# Patient Record
Sex: Female | Born: 1960 | Race: White | Hispanic: No | Smoking: Never smoker
Health system: Southern US, Community
[De-identification: ages and names within clinical notes are randomized; demographics above are authoritative.]

## PROBLEM LIST (undated history)

## (undated) DIAGNOSIS — E785 Hyperlipidemia, unspecified: Secondary | ICD-10-CM

## (undated) DIAGNOSIS — M1990 Unspecified osteoarthritis, unspecified site: Secondary | ICD-10-CM

## (undated) DIAGNOSIS — R519 Headache, unspecified: Secondary | ICD-10-CM

## (undated) DIAGNOSIS — Z860100 Personal history of colon polyps, unspecified: Secondary | ICD-10-CM

## (undated) DIAGNOSIS — G40409 Other generalized epilepsy and epileptic syndromes, not intractable, without status epilepticus: Secondary | ICD-10-CM

## (undated) DIAGNOSIS — I809 Phlebitis and thrombophlebitis of unspecified site: Secondary | ICD-10-CM

## (undated) DIAGNOSIS — M352 Behcet's disease: Secondary | ICD-10-CM

## (undated) DIAGNOSIS — N200 Calculus of kidney: Secondary | ICD-10-CM

## (undated) DIAGNOSIS — F419 Anxiety disorder, unspecified: Secondary | ICD-10-CM

## (undated) DIAGNOSIS — I219 Acute myocardial infarction, unspecified: Secondary | ICD-10-CM

## (undated) DIAGNOSIS — Z9289 Personal history of other medical treatment: Secondary | ICD-10-CM

## (undated) DIAGNOSIS — K219 Gastro-esophageal reflux disease without esophagitis: Secondary | ICD-10-CM

## (undated) DIAGNOSIS — R51 Headache: Secondary | ICD-10-CM

## (undated) DIAGNOSIS — G8929 Other chronic pain: Secondary | ICD-10-CM

## (undated) HISTORY — PX: CHOLECYSTECTOMY: SHX55

## (undated) HISTORY — DX: Phlebitis and thrombophlebitis of unspecified site: I80.9

## (undated) HISTORY — DX: Headache: R51

## (undated) HISTORY — DX: Personal history of colonic polyps: Z86.010

## (undated) HISTORY — DX: Gastro-esophageal reflux disease without esophagitis: K21.9

## (undated) HISTORY — DX: Personal history of colon polyps, unspecified: Z86.0100

## (undated) HISTORY — DX: Hyperlipidemia, unspecified: E78.5

## (undated) HISTORY — DX: Unspecified osteoarthritis, unspecified site: M19.90

## (undated) HISTORY — PX: STONE EXTRACTION WITH BASKET: SHX5318

## (undated) HISTORY — DX: Behcet's disease: M35.2

## (undated) HISTORY — DX: Personal history of other medical treatment: Z92.89

## (undated) HISTORY — PX: ADENOIDECTOMY: SUR15

## (undated) HISTORY — DX: Other chronic pain: G89.29

## (undated) HISTORY — DX: Anxiety disorder, unspecified: F41.9

## (undated) HISTORY — DX: Headache, unspecified: R51.9

## (undated) HISTORY — PX: BACK SURGERY: SHX140

## (undated) HISTORY — DX: Acute myocardial infarction, unspecified: I21.9

---
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
## 1985-12-14 HISTORY — PX: OOPHORECTOMY: SHX86

## 1986-12-14 HISTORY — PX: ABDOMINAL HYSTERECTOMY: SHX81

## 1997-12-14 HISTORY — PX: LUMBAR FUSION: SHX111

## 2004-12-14 LAB — HM COLONOSCOPY

## 2005-10-12 ENCOUNTER — Emergency Department: Payer: Self-pay | Admitting: Emergency Medicine

## 2006-12-14 HISTORY — PX: PORTACATH PLACEMENT: SHX2246

## 2007-07-22 ENCOUNTER — Emergency Department: Payer: Self-pay

## 2008-11-20 ENCOUNTER — Emergency Department: Payer: Self-pay | Admitting: Unknown Physician Specialty

## 2009-04-28 ENCOUNTER — Emergency Department: Payer: Self-pay | Admitting: Emergency Medicine

## 2009-07-12 ENCOUNTER — Inpatient Hospital Stay: Payer: Self-pay | Admitting: Internal Medicine

## 2010-03-31 ENCOUNTER — Inpatient Hospital Stay: Payer: Self-pay | Admitting: Internal Medicine

## 2010-07-13 ENCOUNTER — Emergency Department: Payer: Self-pay | Admitting: Unknown Physician Specialty

## 2010-11-18 ENCOUNTER — Inpatient Hospital Stay: Payer: Self-pay | Admitting: Internal Medicine

## 2011-03-23 ENCOUNTER — Inpatient Hospital Stay: Payer: Self-pay | Admitting: Specialist

## 2011-03-27 ENCOUNTER — Ambulatory Visit (HOSPITAL_COMMUNITY)
Admission: EM | Admit: 2011-03-27 | Discharge: 2011-03-27 | Disposition: A | Discharge status: Home / Self Care | Payer: Self-pay | Source: Other Acute Inpatient Hospital | Attending: Psychiatry | Admitting: Psychiatry

## 2011-03-27 DIAGNOSIS — R569 Unspecified convulsions: Secondary | ICD-10-CM | POA: Insufficient documentation

## 2011-03-27 DIAGNOSIS — J9600 Acute respiratory failure, unspecified whether with hypoxia or hypercapnia: Secondary | ICD-10-CM | POA: Insufficient documentation

## 2011-11-06 ENCOUNTER — Emergency Department: Payer: Self-pay | Admitting: Internal Medicine

## 2012-07-28 ENCOUNTER — Emergency Department: Payer: Self-pay | Admitting: Emergency Medicine

## 2012-07-28 LAB — CK TOTAL AND CKMB (NOT AT ARMC): CK, Total: 148 U/L (ref 21–215)

## 2012-07-28 LAB — DRUG SCREEN, URINE
Amphetamines, Ur Screen: NEGATIVE (ref ?–1000)
Benzodiazepine, Ur Scrn: POSITIVE (ref ?–200)
Cocaine Metabolite,Ur ~~LOC~~: NEGATIVE (ref ?–300)
MDMA (Ecstasy)Ur Screen: NEGATIVE (ref ?–500)
Methadone, Ur Screen: NEGATIVE (ref ?–300)
Opiate, Ur Screen: NEGATIVE (ref ?–300)
Phencyclidine (PCP) Ur S: NEGATIVE (ref ?–25)
Tricyclic, Ur Screen: NEGATIVE (ref ?–1000)

## 2012-07-28 LAB — URINALYSIS, COMPLETE
Bacteria: NONE SEEN
Bilirubin,UR: NEGATIVE
Blood: NEGATIVE
Glucose,UR: NEGATIVE mg/dL (ref 0–75)
Ketone: NEGATIVE
Leukocyte Esterase: NEGATIVE
Nitrite: NEGATIVE
Ph: 8 (ref 4.5–8.0)
RBC,UR: 1 /HPF (ref 0–5)
Specific Gravity: 1.011 (ref 1.003–1.030)
Squamous Epithelial: <1
WBC UR: 1 /HPF (ref 0–5)

## 2012-07-28 LAB — COMPREHENSIVE METABOLIC PANEL
Albumin: 3.5 g/dL (ref 3.4–5.0)
Alkaline Phosphatase: 102 U/L (ref 50–136)
BUN: 15 mg/dL (ref 7–18)
Calcium, Total: 7.7 mg/dL — ABNORMAL LOW (ref 8.5–10.1)
Chloride: 112 mmol/L — ABNORMAL HIGH (ref 98–107)
Co2: 22 mmol/L (ref 21–32)
Glucose: 104 mg/dL — ABNORMAL HIGH (ref 65–99)
Osmolality: 284 (ref 275–301)
Potassium: 3.6 mmol/L (ref 3.5–5.1)
SGOT(AST): 22 U/L (ref 15–37)
Sodium: 142 mmol/L (ref 136–145)

## 2012-07-28 LAB — CBC
HCT: 32.6 % — ABNORMAL LOW (ref 35.0–47.0)
MCH: 32.4 pg (ref 26.0–34.0)
MCHC: 35.3 g/dL (ref 32.0–36.0)
MCV: 92 fL (ref 80–100)
Platelet: 232 10*3/uL (ref 150–440)

## 2012-07-28 LAB — TROPONIN I: Troponin-I: <0.02 ng/mL

## 2012-07-28 LAB — VALPROIC ACID LEVEL: Valproic Acid: <3 ug/mL — ABNORMAL LOW

## 2013-03-02 ENCOUNTER — Ambulatory Visit (INDEPENDENT_AMBULATORY_CARE_PROVIDER_SITE_OTHER): Payer: Federal, State, Local not specified - PPO | Admitting: Internal Medicine

## 2013-03-02 ENCOUNTER — Telehealth: Payer: Self-pay | Admitting: Internal Medicine

## 2013-03-02 ENCOUNTER — Encounter: Payer: Self-pay | Admitting: Internal Medicine

## 2013-03-02 ENCOUNTER — Ambulatory Visit (INDEPENDENT_AMBULATORY_CARE_PROVIDER_SITE_OTHER): Payer: Federal, State, Local not specified - PPO

## 2013-03-02 VITALS — BP 122/82 | HR 85 | Temp 97.6°F | Ht 70.5 in | Wt 165.2 lb

## 2013-03-02 DIAGNOSIS — Z13220 Encounter for screening for lipoid disorders: Secondary | ICD-10-CM

## 2013-03-02 DIAGNOSIS — Z130 Encounter for screening for diseases of the blood and blood-forming organs and certain disorders involving the immune mechanism: Secondary | ICD-10-CM

## 2013-03-02 DIAGNOSIS — Z1239 Encounter for other screening for malignant neoplasm of breast: Secondary | ICD-10-CM

## 2013-03-02 DIAGNOSIS — R519 Headache, unspecified: Secondary | ICD-10-CM | POA: Insufficient documentation

## 2013-03-02 DIAGNOSIS — R569 Unspecified convulsions: Secondary | ICD-10-CM

## 2013-03-02 DIAGNOSIS — Z0000 Encounter for general adult medical examination without abnormal findings: Secondary | ICD-10-CM

## 2013-03-02 DIAGNOSIS — R51 Headache: Secondary | ICD-10-CM | POA: Insufficient documentation

## 2013-03-02 DIAGNOSIS — M352 Behcet's disease: Secondary | ICD-10-CM

## 2013-03-02 DIAGNOSIS — G4700 Insomnia, unspecified: Secondary | ICD-10-CM | POA: Insufficient documentation

## 2013-03-02 DIAGNOSIS — Z1231 Encounter for screening mammogram for malignant neoplasm of breast: Secondary | ICD-10-CM

## 2013-03-02 DIAGNOSIS — Z1329 Encounter for screening for other suspected endocrine disorder: Secondary | ICD-10-CM

## 2013-03-02 DIAGNOSIS — Z131 Encounter for screening for diabetes mellitus: Secondary | ICD-10-CM

## 2013-03-02 DIAGNOSIS — Z1211 Encounter for screening for malignant neoplasm of colon: Secondary | ICD-10-CM

## 2013-03-02 LAB — LIPID PANEL
Cholesterol: 234 mg/dL — ABNORMAL HIGH (ref 0–200)
HDL: 45.9 mg/dL (ref >39.00)
VLDL: 16.6 mg/dL (ref 0.0–40.0)

## 2013-03-02 LAB — BASIC METABOLIC PANEL
GFR: 78.56 mL/min (ref >60.00)
Potassium: 4.4 mEq/L (ref 3.5–5.1)
Sodium: 140 mEq/L (ref 135–145)

## 2013-03-02 LAB — LDL CHOLESTEROL, DIRECT: Direct LDL: 186.4 mg/dL

## 2013-03-02 MED ORDER — LEVETIRACETAM 500 MG PO TABS: 500.0000 mg | ORAL_TABLET | Freq: Two times a day (BID) | ORAL | Status: DC

## 2013-03-02 MED ORDER — HYDROXYCHLOROQUINE SULFATE 200 MG PO TABS: 200.0000 mg | ORAL_TABLET | Freq: Two times a day (BID) | ORAL | Status: DC

## 2013-03-02 MED ORDER — ZOLPIDEM TARTRATE 10 MG PO TABS: 10.0000 mg | ORAL_TABLET | Freq: Every evening | ORAL | Status: DC | PRN

## 2013-03-02 MED ORDER — TRAMADOL HCL 50 MG PO TABS: 50.0000 mg | ORAL_TABLET | Freq: Four times a day (QID) | ORAL | Status: DC | PRN

## 2013-03-02 NOTE — Addendum Note (Signed)
Addended by: Lorre Munroe on: 03/02/2013 01:04 PM   Modules accepted: Orders

## 2013-03-02 NOTE — Assessment & Plan Note (Signed)
Well controlled on current therapy Refilled Keppra today Pt has not had seizure in 2 years Pt would prefer not to go to a neurologist at this time

## 2013-03-02 NOTE — Progress Notes (Signed)
HPI  Pt presents to the clinic today to establish care. She recently moved from [facility] but has not seen a PCP in a number of years. She does need refills of her medications but other than that, she has no concerns today.  Flu: never Tetanus: more than 10 years Mammogram: 2004 Colonoscopy: 2006 (adenoma in cecum) Eye doctor: not in a while Dentist: biannually LMP: hysterectomy  Past Medical History  Diagnosis Date  . GERD (gastroesophageal reflux disease)   . Hyperlipidemia   . History of blood transfusion   . Seizures   . Chronic headaches   . Phlebitis   . History of colonic polyps     Current Outpatient Prescriptions  Medication Sig Dispense Refill  . hydroxychloroquine (PLAQUENIL) 200 MG tablet Take by mouth 2 (two) times daily.      Marland Kitchen levETIRAcetam (KEPPRA) 500 MG tablet Take 500 mg by mouth 2 (two) times daily.      . traMADol (ULTRAM) 50 MG tablet Take 50 mg by mouth every 6 (six) hours as needed for pain.      Marland Kitchen zolpidem (AMBIEN) 10 MG tablet Take 10 mg by mouth at bedtime as needed for sleep.       No current facility-administered medications for this visit.    Allergies  Allergen Reactions  . Codeine   . Hepatitis B Vaccine   . Imitrex (Sumatriptan)   . Other     CT Contrast    Family History  Problem Relation Age of Onset  . Diabetes Mother   . Hyperlipidemia Mother   . Stroke Mother   . Hypertension Mother   . Colon cancer Mother   . Breast cancer Mother   . Early death Father   . Heart attack Father   . Heart disease Father   . Hyperlipidemia Father     History   Social History  . Marital Status: Married    Spouse Name: N/A    Number of Children: 2  . Years of Education: 14   Occupational History  . Nurse    Social History Main Topics  . Smoking status: Never Smoker   . Smokeless tobacco: Never Used  . Alcohol Use: No  . Drug Use: No  . Sexually Active: Yes    Birth Control/ Protection: Surgical   Other Topics Concern  . Not  on file   Social History Narrative   Regular exercise-no   Caffeine Use-yes    ROS:  Constitutional: Denies fever, malaise, fatigue, headache or abrupt weight changes.  HEENT: Denies eye pain, eye redness, ear pain, ringing in the ears, wax buildup, runny nose, nasal congestion, bloody nose, or sore throat. Respiratory: Denies difficulty breathing, shortness of breath, cough or sputum production.   Cardiovascular: Denies chest pain, chest tightness, palpitations or swelling in the hands or feet.  Gastrointestinal: Denies abdominal pain, bloating, constipation, diarrhea or blood in the stool.  GU: Denies frequency, urgency, pain with urination, blood in urine, odor or discharge. Musculoskeletal: Denies decrease in range of motion, difficulty with gait, muscle pain or joint pain and swelling.  Skin: Denies redness, rashes, lesions or ulcercations.  Neurological: Denies dizziness, difficulty with memory, difficulty with speech or problems with balance and coordination.   No other specific complaints in a complete review of systems (except as listed in HPI above).  PE:  BP 122/82  Pulse 85  Temp(Src) 97.6 F (36.4 C) (Oral)  Ht 5' 10.5" (1.791 m)  Wt 165 lb 3.2 oz (74.934  kg)  BMI 23.36 kg/m2  SpO2 97% Wt Readings from Last 3 Encounters:  03/02/13 165 lb 3.2 oz (74.934 kg)    General: Appears her stated age, well developed, well nourished in NAD. HEENT: Head: normal shape and size; Eyes: sclera white, no icterus, conjunctiva pink, PERRLA and EOMs intact; Ears: Tm's gray and intact, normal light reflex; Nose: mucosa pink and moist, septum midline; Throat/Mouth: Teeth present, mucosa pink and moist, no lesions or ulcerations noted.  Neck: Normal range of motion. Neck supple, trachea midline. No massses, lumps or thyromegaly present.  Cardiovascular: Normal rate and rhythm. S1,S2 noted.  No murmur, rubs or gallops noted. No JVD or BLE edema. No carotid bruits noted. Pulmonary/Chest:  Normal effort and positive vesicular breath sounds. No respiratory distress. No wheezes, rales or ronchi noted.  Abdomen: Soft and nontender. Normal bowel sounds, no bruits noted. No distention or masses noted. Liver, spleen and kidneys non palpable. Musculoskeletal: Normal range of motion. No signs of joint swelling. No difficulty with gait.  Neurological: Alert and oriented. Cranial nerves II-XII intact. Coordination normal. +DTRs bilaterally. Psychiatric: Mood and affect normal. Behavior is normal. Judgment and thought content normal.     Assessment and Plan:  Preventative Health Maintenance:  Will set up mammogram Will refer to eye doctor Will set up colonoscopy- pt prefers Dr. Marva Panda  Will obtain basic screening labs today.

## 2013-03-02 NOTE — Assessment & Plan Note (Signed)
Well controlled Refilled plaquenil today

## 2013-03-02 NOTE — Telephone Encounter (Signed)
Ash, Let me find out if we have the stuff to access her port. If so she can come back and I can draw her labs out of her port. We will call her back. Rene Kocher

## 2013-03-02 NOTE — Assessment & Plan Note (Signed)
Well controlled on current therapy Refilled Ambien today 

## 2013-03-02 NOTE — Patient Instructions (Signed)

## 2013-03-02 NOTE — Telephone Encounter (Signed)
Went to lab to get blood work done but the lab was not able to find a vein.  Wants to know if she needs to schedule another appointment to come back or what Brenda Mcguire would like her to do.

## 2013-03-02 NOTE — Assessment & Plan Note (Signed)
Refilled tramadol today Only take as needed

## 2013-03-03 NOTE — Telephone Encounter (Signed)
Pt informed

## 2013-03-10 ENCOUNTER — Encounter: Payer: Self-pay | Admitting: Internal Medicine

## 2013-03-13 ENCOUNTER — Encounter: Payer: Self-pay | Admitting: Internal Medicine

## 2013-03-13 ENCOUNTER — Ambulatory Visit (INDEPENDENT_AMBULATORY_CARE_PROVIDER_SITE_OTHER): Payer: Federal, State, Local not specified - PPO | Admitting: Internal Medicine

## 2013-03-13 VITALS — BP 122/74 | HR 132 | Temp 98.4°F

## 2013-03-13 DIAGNOSIS — G43009 Migraine without aura, not intractable, without status migrainosus: Secondary | ICD-10-CM

## 2013-03-13 DIAGNOSIS — R112 Nausea with vomiting, unspecified: Secondary | ICD-10-CM

## 2013-03-13 MED ORDER — KETOROLAC TROMETHAMINE 60 MG/2ML IM SOLN
30.0000 mg | Freq: Once | INTRAMUSCULAR | Status: AC
  Administered 2013-03-13: 30 mg via INTRAMUSCULAR

## 2013-03-13 MED ORDER — ONDANSETRON HCL 4 MG/2ML IJ SOLN
4.0000 mg | Freq: Once | INTRAMUSCULAR | Status: AC
  Administered 2013-03-13: 4 mg via INTRAMUSCULAR

## 2013-03-13 NOTE — Patient Instructions (Signed)
Migraine Headache A migraine headache is an intense, throbbing pain on one or both sides of your head. A migraine can last for 30 minutes to several hours. CAUSES  The exact cause of a migraine headache is not always known. However, a migraine may be caused when nerves in the brain become irritated and release chemicals that cause inflammation. This causes pain. SYMPTOMS  Pain on one or both sides of your head.  Pulsating or throbbing pain.  Severe pain that prevents daily activities.  Pain that is aggravated by any physical activity.  Nausea, vomiting, or both.  Dizziness.  Pain with exposure to bright lights, loud noises, or activity.  General sensitivity to bright lights, loud noises, or smells. Before you get a migraine, you may get warning signs that a migraine is coming (aura). An aura may include:  Seeing flashing lights.  Seeing bright spots, halos, or zig-zag lines.  Having tunnel vision or blurred vision.  Having feelings of numbness or tingling.  Having trouble talking.  Having muscle weakness. MIGRAINE TRIGGERS  Alcohol.  Smoking.  Stress.  Menstruation.  Aged cheeses.  Foods or drinks that contain nitrates, glutamate, aspartame, or tyramine.  Lack of sleep.  Chocolate.  Caffeine.  Hunger.  Physical exertion.  Fatigue.  Medicines used to treat chest pain (nitroglycerine), birth control pills, estrogen, and some blood pressure medicines. DIAGNOSIS  A migraine headache is often diagnosed based on:  Symptoms.  Physical examination.  A CT scan or MRI of your head. TREATMENT Medicines may be given for pain and nausea. Medicines can also be given to help prevent recurrent migraines.  HOME CARE INSTRUCTIONS  Only take over-the-counter or prescription medicines for pain or discomfort as directed by your caregiver. The use of long-term narcotics is not recommended.  Lie down in a dark, quiet room when you have a migraine.  Keep a journal  to find out what may trigger your migraine headaches. For example, write down:  What you eat and drink.  How much sleep you get.  Any change to your diet or medicines.  Limit alcohol consumption.  Quit smoking if you smoke.  Get 7 to 9 hours of sleep, or as recommended by your caregiver.  Limit stress.  Keep lights dim if bright lights bother you and make your migraines worse. SEEK IMMEDIATE MEDICAL CARE IF:   Your migraine becomes severe.  You have a fever.  You have a stiff neck.  You have vision loss.  You have muscular weakness or loss of muscle control.  You start losing your balance or have trouble walking.  You feel faint or pass out.  You have severe symptoms that are different from your first symptoms. MAKE SURE YOU:   Understand these instructions.  Will watch your condition.  Will get help right away if you are not doing well or get worse. Document Released: 11/30/2005 Document Revised: 02/22/2012 Document Reviewed: 11/20/2011 ExitCare Patient Information 2013 [employer].  

## 2013-03-13 NOTE — Progress Notes (Signed)
Subjective:    Patient ID: Brenda Mcguire, female    DOB: [birth year], 52 y.o.   MRN: [id number]  HPI  Pt presents to the clinic today with c/o migraine. This one started 2 days ago. She is having blurred vision and sensitivity to light and sound but does not have an aura. She has had migraines in the past but has not had one in a few years. She thinks it was caused by stress. She has been having nausea and vomiting. She has been taking Excedrin and phenergan suppositories without relief.  Review of Systems      Past Medical History  Diagnosis Date  . GERD (gastroesophageal reflux disease)   . Hyperlipidemia   . History of blood transfusion   . Seizures   . Chronic headaches   . Phlebitis   . History of colonic polyps     Current Outpatient Prescriptions  Medication Sig Dispense Refill  . hydroxychloroquine (PLAQUENIL) 200 MG tablet Take 1 tablet (200 mg total) by mouth 2 (two) times daily.  60 tablet  3  . levETIRAcetam (KEPPRA) 500 MG tablet Take 1 tablet (500 mg total) by mouth 2 (two) times daily.  60 tablet  3  . traMADol (ULTRAM) 50 MG tablet Take 1 tablet (50 mg total) by mouth every 6 (six) hours as needed for pain.  30 tablet  2  . zolpidem (AMBIEN) 10 MG tablet Take 1 tablet (10 mg total) by mouth at bedtime as needed for sleep.  30 tablet  2   No current facility-administered medications for this visit.    Allergies  Allergen Reactions  . Codeine   . Hepatitis B Vaccine   . Imitrex (Sumatriptan)   . Other     CT Contrast    Family History  Problem Relation Age of Onset  . Diabetes Mother   . Hyperlipidemia Mother   . Stroke Mother   . Hypertension Mother   . Colon cancer Mother   . Breast cancer Mother   . Early death Father   . Heart attack Father   . Heart disease Father   . Hyperlipidemia Father     History   Social History  . Marital Status: Married    Spouse Name: N/A    Number of Children: 2  . Years of Education: 14   Occupational  History  . Nurse    Social History Main Topics  . Smoking status: Never Smoker   . Smokeless tobacco: Never Used  . Alcohol Use: No  . Drug Use: No  . Sexually Active: Yes    Birth Control/ Protection: Surgical   Other Topics Concern  . Not on file   Social History Narrative   Regular exercise-no   Caffeine Use-yes     Constitutional: Pt reports migraine. Denies fever, malaise, fatigue or abrupt weight changes.  HEENT: Pt reports blurred vision. Denies eye pain, eye redness, ear pain, ringing in the ears, wax buildup, runny nose, nasal congestion, bloody nose, or sore throat.  Gastrointestinal: Pt reports nausea and vomiting. Denies abdominal pain, bloating, constipation, diarrhea or blood in the stool.  Neurological: Pt reports sensitivity to light and sound. Denies dizziness, difficulty with memory, difficulty with speech or problems with balance and coordination.   No other specific complaints in a complete review of systems (except as listed in HPI above).  Objective:   Physical Exam   BP 122/74  Pulse 132  Temp(Src) 98.4 F (36.9 C) (Oral)  SpO2 97% Wt Readings  from Last 3 Encounters:  03/02/13 165 lb 3.2 oz (74.934 kg)    General: Appears her stated age, well developed, well nourished in NAD. HEENT: Head: normal shape and size; Eyes: sclera white, no icterus, conjunctiva pink, PERRLA and EOMs intact; Ears: Tm's gray and intact, normal light reflex; Nose: mucosa pink and moist, septum midline; Throat/Mouth: Teeth present, mucosa pink and moist, no exudate, lesions or ulcerations noted.  Cardiovascular: Normal rate and rhythm. S1,S2 noted.  No murmur, rubs or gallops noted. No JVD or BLE edema. No carotid bruits noted. Pulmonary/Chest: Normal effort and positive vesicular breath sounds. No respiratory distress. No wheezes, rales or ronchi noted.  Abdomen: Soft and nontender. Normal bowel sounds, no bruits noted. No distention or masses noted. Liver, spleen and kidneys  non palpable. Neurological: Alert and oriented. Cranial nerves II-XII intact. Coordination normal. +DTRs bilaterally.      Assessment & Plan:   Migraine, new onset:  4 mg Zofran IM now 30 mg Toradol IM now Go home and try to get some sleep  RTC if pain persist or worsens

## 2013-03-13 NOTE — Telephone Encounter (Signed)
Pt will have lab work done with employer and fax copy of results to office.

## 2013-03-13 NOTE — Addendum Note (Signed)
Addended by: Carin Primrose on: 03/13/2013 10:57 AM   Modules accepted: Orders

## 2013-03-17 ENCOUNTER — Other Ambulatory Visit: Payer: Self-pay | Admitting: Internal Medicine

## 2013-03-17 ENCOUNTER — Encounter: Payer: Self-pay | Admitting: Internal Medicine

## 2013-03-17 DIAGNOSIS — F4321 Adjustment disorder with depressed mood: Secondary | ICD-10-CM

## 2013-03-17 MED ORDER — ALPRAZOLAM 0.5 MG PO TABS: 0.5000 mg | ORAL_TABLET | Freq: Three times a day (TID) | ORAL | Status: DC | PRN

## 2013-03-23 ENCOUNTER — Encounter: Payer: Self-pay | Admitting: Internal Medicine

## 2013-03-30 ENCOUNTER — Encounter: Payer: Self-pay | Admitting: Internal Medicine

## 2013-03-31 ENCOUNTER — Encounter: Payer: Self-pay | Admitting: Internal Medicine

## 2013-04-03 ENCOUNTER — Encounter: Payer: Self-pay | Admitting: *Deleted

## 2013-04-03 ENCOUNTER — Telehealth: Payer: Self-pay | Admitting: *Deleted

## 2013-04-03 NOTE — Telephone Encounter (Signed)
R'cd fax from [facility] that PA for Zolpidem is needed-PA approved 03/03/2013-04/03/2014. [facility] informed.

## 2013-04-11 ENCOUNTER — Encounter: Payer: Self-pay | Admitting: Internal Medicine

## 2013-04-12 ENCOUNTER — Encounter: Payer: Self-pay | Admitting: Internal Medicine

## 2013-04-12 ENCOUNTER — Emergency Department (HOSPITAL_COMMUNITY): Payer: Federal, State, Local not specified - PPO

## 2013-04-12 ENCOUNTER — Encounter (HOSPITAL_COMMUNITY): Payer: Self-pay

## 2013-04-12 ENCOUNTER — Encounter (HOSPITAL_COMMUNITY): Payer: Self-pay | Admitting: Neurology

## 2013-04-12 ENCOUNTER — Emergency Department (HOSPITAL_COMMUNITY)
Admission: EM | Admit: 2013-04-12 | Discharge: 2013-04-12 | Disposition: A | Discharge status: Home / Self Care | Payer: Federal, State, Local not specified - PPO | Attending: Emergency Medicine | Admitting: Emergency Medicine

## 2013-04-12 ENCOUNTER — Ambulatory Visit (INDEPENDENT_AMBULATORY_CARE_PROVIDER_SITE_OTHER): Payer: Federal, State, Local not specified - PPO | Admitting: Internal Medicine

## 2013-04-12 VITALS — BP 108/68 | HR 108 | Temp 98.0°F | Ht 70.5 in | Wt 172.0 lb

## 2013-04-12 DIAGNOSIS — R509 Fever, unspecified: Secondary | ICD-10-CM | POA: Insufficient documentation

## 2013-04-12 DIAGNOSIS — Z862 Personal history of diseases of the blood and blood-forming organs and certain disorders involving the immune mechanism: Secondary | ICD-10-CM | POA: Insufficient documentation

## 2013-04-12 DIAGNOSIS — M352 Behcet's disease: Secondary | ICD-10-CM

## 2013-04-12 DIAGNOSIS — H53149 Visual discomfort, unspecified: Secondary | ICD-10-CM | POA: Insufficient documentation

## 2013-04-12 DIAGNOSIS — F411 Generalized anxiety disorder: Secondary | ICD-10-CM | POA: Insufficient documentation

## 2013-04-12 DIAGNOSIS — M542 Cervicalgia: Secondary | ICD-10-CM | POA: Insufficient documentation

## 2013-04-12 DIAGNOSIS — Z79899 Other long term (current) drug therapy: Secondary | ICD-10-CM | POA: Insufficient documentation

## 2013-04-12 DIAGNOSIS — Z8719 Personal history of other diseases of the digestive system: Secondary | ICD-10-CM | POA: Insufficient documentation

## 2013-04-12 DIAGNOSIS — M2550 Pain in unspecified joint: Secondary | ICD-10-CM | POA: Insufficient documentation

## 2013-04-12 DIAGNOSIS — G40909 Epilepsy, unspecified, not intractable, without status epilepticus: Secondary | ICD-10-CM | POA: Insufficient documentation

## 2013-04-12 DIAGNOSIS — Z860100 Personal history of colon polyps, unspecified: Secondary | ICD-10-CM | POA: Insufficient documentation

## 2013-04-12 DIAGNOSIS — Z8679 Personal history of other diseases of the circulatory system: Secondary | ICD-10-CM | POA: Insufficient documentation

## 2013-04-12 DIAGNOSIS — R51 Headache: Secondary | ICD-10-CM

## 2013-04-12 DIAGNOSIS — Z8639 Personal history of other endocrine, nutritional and metabolic disease: Secondary | ICD-10-CM | POA: Insufficient documentation

## 2013-04-12 DIAGNOSIS — R112 Nausea with vomiting, unspecified: Secondary | ICD-10-CM | POA: Insufficient documentation

## 2013-04-12 DIAGNOSIS — Z8619 Personal history of other infectious and parasitic diseases: Secondary | ICD-10-CM | POA: Insufficient documentation

## 2013-04-12 DIAGNOSIS — IMO0001 Reserved for inherently not codable concepts without codable children: Secondary | ICD-10-CM | POA: Insufficient documentation

## 2013-04-12 DIAGNOSIS — R5381 Other malaise: Secondary | ICD-10-CM | POA: Insufficient documentation

## 2013-04-12 LAB — GRAM STAIN

## 2013-04-12 LAB — CBC WITH DIFFERENTIAL/PLATELET
Basophils Absolute: 0 10*3/uL (ref 0.0–0.1)
Lymphocytes Relative: 25 % (ref 12–46)
Neutro Abs: 3.7 10*3/uL (ref 1.7–7.7)
Neutrophils Relative %: 58 % (ref 43–77)
Platelets: 205 10*3/uL (ref 150–400)
RDW: 12.9 % (ref 11.5–15.5)
WBC: 6.3 10*3/uL (ref 4.0–10.5)

## 2013-04-12 LAB — CSF CELL COUNT WITH DIFFERENTIAL: WBC, CSF: 1 /mm3 (ref 0–5)

## 2013-04-12 LAB — COMPREHENSIVE METABOLIC PANEL
ALT: 41 U/L — ABNORMAL HIGH (ref 0–35)
AST: 40 U/L — ABNORMAL HIGH (ref 0–37)
CO2: 27 mEq/L (ref 19–32)
Calcium: 9.2 mg/dL (ref 8.4–10.5)
Chloride: 105 mEq/L (ref 96–112)
GFR calc non Af Amer: >90 mL/min (ref >90)
Sodium: 138 mEq/L (ref 135–145)

## 2013-04-12 LAB — HM MAMMOGRAPHY

## 2013-04-12 MED ORDER — DIPHENHYDRAMINE HCL 50 MG/ML IJ SOLN
25.0000 mg | Freq: Once | INTRAMUSCULAR | Status: AC
  Administered 2013-04-12: 25 mg via INTRAVENOUS
  Filled 2013-04-12: qty 1

## 2013-04-12 MED ORDER — LORAZEPAM 2 MG/ML IJ SOLN
INTRAMUSCULAR | Status: AC
  Filled 2013-04-12: qty 1

## 2013-04-12 MED ORDER — KETOROLAC TROMETHAMINE 30 MG/ML IJ SOLN
30.0000 mg | Freq: Once | INTRAMUSCULAR | Status: AC
  Administered 2013-04-12: 30 mg via INTRAVENOUS
  Filled 2013-04-12: qty 1

## 2013-04-12 MED ORDER — METOCLOPRAMIDE HCL 5 MG/ML IJ SOLN
10.0000 mg | Freq: Once | INTRAMUSCULAR | Status: AC
  Administered 2013-04-12: 10 mg via INTRAVENOUS
  Filled 2013-04-12: qty 2

## 2013-04-12 MED ORDER — HYDROMORPHONE HCL PF 1 MG/ML IJ SOLN
1.0000 mg | Freq: Once | INTRAMUSCULAR | Status: AC
  Administered 2013-04-12: 1 mg via INTRAVENOUS
  Filled 2013-04-12: qty 1

## 2013-04-12 MED ORDER — SODIUM CHLORIDE 0.9 % IV BOLUS (SEPSIS)
1000.0000 mL | Freq: Once | INTRAVENOUS | Status: AC
  Administered 2013-04-12: 1000 mL via INTRAVENOUS

## 2013-04-12 MED ORDER — ONDANSETRON 4 MG PO TBDP: ORAL_TABLET | ORAL | Status: DC

## 2013-04-12 MED ORDER — DIPHENHYDRAMINE HCL 50 MG/ML IJ SOLN
12.5000 mg | Freq: Once | INTRAMUSCULAR | Status: AC
  Administered 2013-04-12: 12.5 mg via INTRAVENOUS
  Filled 2013-04-12: qty 1

## 2013-04-12 MED ORDER — HYDROCODONE-ACETAMINOPHEN 5-325 MG PO TABS: 1.0000 | ORAL_TABLET | Freq: Four times a day (QID) | ORAL | Status: DC | PRN

## 2013-04-12 MED ORDER — ONDANSETRON HCL 4 MG/2ML IJ SOLN: 4.0000 mg | Freq: Once | INTRAMUSCULAR | Status: AC

## 2013-04-12 MED ORDER — TRAMADOL HCL 50 MG PO TABS: 50.0000 mg | ORAL_TABLET | Freq: Four times a day (QID) | ORAL | Status: DC | PRN

## 2013-04-12 MED ORDER — ONDANSETRON HCL 4 MG/2ML IJ SOLN
INTRAMUSCULAR | Status: AC
  Administered 2013-04-12: 4 mg via INTRAVENOUS
  Filled 2013-04-12: qty 2

## 2013-04-12 MED ORDER — DEXAMETHASONE SODIUM PHOSPHATE 10 MG/ML IJ SOLN
10.0000 mg | Freq: Once | INTRAMUSCULAR | Status: AC
  Administered 2013-04-12: 10 mg via INTRAVENOUS
  Filled 2013-04-12: qty 1

## 2013-04-12 MED ORDER — LIDOCAINE HCL (PF) 1 % IJ SOLN
INTRAMUSCULAR | Status: AC
  Administered 2013-04-12: 16:00:00
  Filled 2013-04-12: qty 5

## 2013-04-12 MED ORDER — SODIUM CHLORIDE 0.9 % IV SOLN
1000.0000 mg | Freq: Two times a day (BID) | INTRAVENOUS | Status: DC
  Administered 2013-04-12: 1000 mg via INTRAVENOUS
  Filled 2013-04-12 (×2): qty 10

## 2013-04-12 NOTE — ED Provider Notes (Signed)
4:20 PM Assumed care of the patient from PA Kirichenko. Patient w/ hx of Behcet's disorder and previous aseptic meningits. Unsuccessful attempt at Lumbar puncture here in the ED. Patient at IR.  9:00 PM Patient CSF reulted No evidence of meningitis.She continues to have sever headace. I will treat the patient with Migraine cocktail and d/c home to follow up with her PCP and rheumatology.  Arthor Captain, PA-C 04/12/13 2111  Arthor Captain, PA-C 04/13/13 [phone]

## 2013-04-12 NOTE — Patient Instructions (Signed)
Behet's Disease Behet's disease is a rare, chronic, lifelong disorder. It involves inflammation of blood vessels throughout the body. CAUSES  The exact cause is unknown. It is believed that an autoimmune reaction may cause blood vessels to become inflamed. It is not clear what triggers this reaction. SYMPTOMS  Symptoms of Behet's disease include:  Returning (recurrent) genital ulcers and oral ulcers that resemble canker sores.  Eye inflammation. The disorder may also cause:  Various types of skin sores (lesions).  Arthritis.  Bowel inflammation.  Inflammation of the membranes of the brain and spinal cord (meningitis). This disease generally begins when patients are in their 20s or 30s. But all age groups may be affected. Behet's is a multisystem disease. It may involve all organs and affect the central nervous system. This may cause:  Memory loss.  Impaired speech, balance, and movement. TREATMENT  There is no cure for Behet's disease. Treatment typically focuses on reducing discomfort and preventing serious complications. The effects of the disease may include blindness, stroke, swelling of the spinal cord, or intestinal complications. Corticosteroids and other medications that suppress the immune system may be given to treat inflammation. Behet's recurs or keeps causing problems (chronic). But patients may have periods of time when symptoms go away temporarily (remission). How bad the disease gets is different from patient to patient. Some patients may live normal lives. Others may become blind or severely disabled. FOR MORE INFORMATION American Behet's Disease Association: www.behcets.com Document Released: 11/20/2002 Document Revised: 02/22/2012 Document Reviewed: 11/30/2005 Exit[employer] Patient Information 2013 [employer].  

## 2013-04-12 NOTE — ED Provider Notes (Signed)
History     CSN: [id number]  Arrival date & time 04/12/13  1206   First MD Initiated Contact with Patient 04/12/13 1207      Chief Complaint  Patient presents with  . Headache  . Nausea  . Fever  . Joint Pain    (Consider location/radiation/quality/duration/timing/severity/associated sxs/prior treatment) HPI Brenda Mcguire is a 52 y.o. female who presents to ED with complaint of headache, joint pains, nausea, intermittent fevers for a month. States hx of Behcet's diasease and epilepsy. States not able to keep her medications down. States hx of the same symptoms years ago and was diagnosed with aseptic meningitis and had elevated CSF pressures. sttes went to her PCP a month ago. Given ultram wich she has been taking, but her symptoms are worsening. Reports headache, pain in joints, intermittent fevers up to 103, neck pain and stiffness, photophobia, nausea, vomiting. States vomited 5 times yesterday and 3 today. Unable to keep anything down. Denies chest pain, abdominal pain.  Past Medical History  Diagnosis Date  . GERD (gastroesophageal reflux disease)   . Hyperlipidemia   . History of blood transfusion   . Seizures   . Chronic headaches   . Phlebitis   . History of colonic polyps     Past Surgical History  Procedure Laterality Date  . Cholecystectomy    . Tonsillectomy  1970  . Appendectomy  1975  . Abdominal hysterectomy  1988  . Lumbar fusion  1999    L5-S1    Family History  Problem Relation Age of Onset  . Diabetes Mother   . Hyperlipidemia Mother   . Stroke Mother   . Hypertension Mother   . Colon cancer Mother   . Breast cancer Mother   . Early death Father   . Heart attack Father   . Heart disease Father   . Hyperlipidemia Father     History  Substance Use Topics  . Smoking status: Never Smoker   . Smokeless tobacco: Never Used  . Alcohol Use: No    OB History   Grav Para Term Preterm Abortions TAB SAB Ect Mult Living                   Review of Systems  Constitutional: Positive for fever, chills and fatigue.  HENT: Positive for neck pain and neck stiffness.   Eyes: Positive for photophobia. Negative for visual disturbance.  Respiratory: Negative.   Gastrointestinal: Positive for nausea and vomiting. Negative for abdominal pain.  Endocrine: Negative.   Genitourinary: Negative.   Musculoskeletal: Positive for myalgias and arthralgias.  Skin: Negative.   Neurological: Positive for headaches. Negative for dizziness, weakness and numbness.  All other systems reviewed and are negative.    Allergies  Codeine; Hepatitis b vaccine; Imitrex; and Other  Home Medications   Current Outpatient Rx  Name  Route  Sig  Dispense  Refill  . ALPRAZolam (XANAX) 0.5 MG tablet   Oral   Take 1 tablet (0.5 mg total) by mouth 3 (three) times daily as needed for sleep.   30 tablet   1   . hydroxychloroquine (PLAQUENIL) 200 MG tablet   Oral   Take 1 tablet (200 mg total) by mouth 2 (two) times daily.   60 tablet   3   . levETIRAcetam (KEPPRA) 500 MG tablet   Oral   Take 1 tablet (500 mg total) by mouth 2 (two) times daily.   60 tablet   3   . traMADol (ULTRAM) 50  MG tablet   Oral   Take 1 tablet (50 mg total) by mouth every 6 (six) hours as needed for pain.   30 tablet   2   . zolpidem (AMBIEN) 10 MG tablet   Oral   Take 1 tablet (10 mg total) by mouth at bedtime as needed for sleep.   30 tablet   2     BP 111/73  Pulse 101  Temp(Src) 98.1 F (36.7 C) (Oral)  Resp 16  SpO2 100%  Physical Exam  Nursing note and vitals reviewed. Constitutional: She is oriented to person, place, and time. She appears well-developed and well-nourished. No distress.  HENT:  Head: Normocephalic.  Eyes: Conjunctivae and EOM are normal. Pupils are equal, round, and reactive to light.  Neck: Normal range of motion. Neck supple.  Pain with chin to chest  Cardiovascular: Normal rate, regular rhythm and normal heart sounds.    Pulmonary/Chest: Effort normal and breath sounds normal. No respiratory distress. She has no wheezes. She has no rales.  Abdominal: Soft. Bowel sounds are normal. She exhibits no distension. There is no tenderness. There is no rebound.  Musculoskeletal: She exhibits no edema.  Neurological: She is alert and oriented to person, place, and time. No cranial nerve deficit. Coordination normal.  5/5 and equal upper and lower extremity strength bilaterally. Equal grip strength bilaterally. Normal finger to nose and heel to shin. No pronator drift.   Skin: Skin is warm and dry.    ED Course  Procedures (including critical care time)  Pt with hx of behcet's, migraines, seizures. Will get CT head, labs. Discussed with Dr. Estell Harpin, will see pt as well. Concerning for aseptic meningitis, vs migraine headache  Results for orders placed during the hospital encounter of 04/12/13  CBC WITH DIFFERENTIAL      Result Value Range   WBC 6.3  4.0 - 10.5 K/uL   RBC 3.75 (*) 3.87 - 5.11 MIL/uL   Hemoglobin 11.3 (*) 12.0 - 15.0 g/dL   HCT 16.1 (*) 09.6 - 04.5 %   MCV 87.5  78.0 - 100.0 fL   MCH 30.1  26.0 - 34.0 pg   MCHC 34.5  30.0 - 36.0 g/dL   RDW 40.9  81.1 - 91.4 %   Platelets 205  150 - 400 K/uL   Neutrophils Relative 58  43 - 77 %   Neutro Abs 3.7  1.7 - 7.7 K/uL   Lymphocytes Relative 25  12 - 46 %   Lymphs Abs 1.6  0.7 - 4.0 K/uL   Monocytes Relative 7  3 - 12 %   Monocytes Absolute 0.5  0.1 - 1.0 K/uL   Eosinophils Relative 9 (*) 0 - 5 %   Eosinophils Absolute 0.6  0.0 - 0.7 K/uL   Basophils Relative 1  0 - 1 %   Basophils Absolute 0.0  0.0 - 0.1 K/uL  COMPREHENSIVE METABOLIC PANEL      Result Value Range   Sodium 138  135 - 145 mEq/L   Potassium 4.3  3.5 - 5.1 mEq/L   Chloride 105  96 - 112 mEq/L   CO2 27  19 - 32 mEq/L   Glucose, Bld 94  70 - 99 mg/dL   BUN 14  6 - 23 mg/dL   Creatinine, Ser 7.82  0.50 - 1.10 mg/dL   Calcium 9.2  8.4 - 95.6 mg/dL   Total Protein 6.7  6.0 - 8.3 g/dL    Albumin 3.4 (*) 3.5 -  5.2 g/dL   AST 40 (*) 0 - 37 U/L   ALT 41 (*) 0 - 35 U/L   Alkaline Phosphatase 85  39 - 117 U/L   Total Bilirubin 0.3  0.3 - 1.2 mg/dL   GFR calc non Af Amer >90  >90 mL/min   GFR calc Af Amer >90  >90 mL/min   Ct Head Wo Contrast  04/12/2013  *RADIOLOGY REPORT*  Clinical Data: Headache, nausea, fever.  CT HEAD WITHOUT CONTRAST  Technique:  Contiguous axial images were obtained from the base of the skull through the vertex without contrast.  Comparison: None.  Findings: No acute intracranial abnormality.  Specifically, no hemorrhage, hydrocephalus, mass lesion, acute infarction, or significant intracranial injury.  No acute calvarial abnormality. Visualized paranasal sinuses and mastoids clear.  Orbital soft tissues unremarkable.  IMPRESSION: Normal study.   Original Report Authenticated By: Charlett Nose, M.D.    4:31 PM Pt with continued headache, no improvement with dilaudid, decardon, zofran, reglan. Concerning for aseptic meningitis vs increased CSF pressure. LP attempted and failed. Pt does have hx of lumbar fusion. Pt will have LP done by IR. Signed out at shfit change to PA Harris and Dr. Bernette Mayers.      No diagnosis found.    MDM          Lottie Mussel, PA-C 04/14/13 [phone]

## 2013-04-12 NOTE — Progress Notes (Signed)
Subjective:    Patient ID: Brenda Mcguire, female    DOB: 29-Mar-1961, 52 y.o.   MRN: [id number]  HPI  Pt presents to the clinic today with c/o fever of 102 all day yesterday with headache. She has had a headache constantly for the past 4 weeks. Typically the tramadol will take care of her headache but has not helped for the past 2 weeks. She is still taking it. She has had 2 fevers over the last month that last for a day and then resolve. She is also having intermittent nausea, and joint pain. She did have oral ulcers 2 weeks ago but this has resolved. She has never had the vaginal ulcers. 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 She has not had a flare in a number of years. She is not currently being followed by a rheumatologist. The last time she saw a rheumatologist was in 2009.  Review of Systems      Past Medical History  Diagnosis Date  . GERD (gastroesophageal reflux disease)   . Hyperlipidemia   . History of blood transfusion   . Seizures   . Chronic headaches   . Phlebitis   . History of colonic polyps     No current facility-administered medications for this visit.   Current Outpatient Prescriptions  Medication Sig Dispense Refill  . ALPRAZolam (XANAX) 0.5 MG tablet Take 1 tablet (0.5 mg total) by mouth 3 (three) times daily as needed for sleep.  30 tablet  1  . hydroxychloroquine (PLAQUENIL) 200 MG tablet Take 1 tablet (200 mg total) by mouth 2 (two) times daily.  60 tablet  3  . levETIRAcetam (KEPPRA) 500 MG tablet Take 1 tablet (500 mg total) by mouth 2 (two) times daily.  60 tablet  3  . traMADol (ULTRAM) 50 MG tablet Take 1 tablet (50 mg total) by mouth every 6 (six) hours as needed for pain.  30 tablet  2  . zolpidem (AMBIEN) 10 MG tablet Take 1 tablet (10 mg total) by mouth at bedtime as needed for sleep.  30 tablet  2   Facility-Administered Medications Ordered in Other Visits  Medication Dose Route Frequency Provider Last Rate Last Dose  . dexamethasone  (DECADRON) injection 10 mg  10 mg Intravenous Once Tatyana A Kirichenko, PA-C      . diphenhydrAMINE (BENADRYL) injection 12.5 mg  12.5 mg Intravenous Once Tatyana A Kirichenko, PA-C      . metoCLOPramide (REGLAN) injection 10 mg  10 mg Intravenous Once Tatyana A Kirichenko, PA-C      . sodium chloride 0.9 % bolus 1,000 mL  1,000 mL Intravenous Once Tatyana A Kirichenko, PA-C        Allergies  Allergen Reactions  . Codeine   . Hepatitis B Vaccine   . Imitrex (Sumatriptan)   . Other     CT Contrast    Family History  Problem Relation Age of Onset  . Diabetes Mother   . Hyperlipidemia Mother   . Stroke Mother   . Hypertension Mother   . Colon cancer Mother   . Breast cancer Mother   . Early death Father   . Heart attack Father   . Heart disease Father   . Hyperlipidemia Father     History   Social History  . Marital Status: Married    Spouse Name: N/A    Number of Children: 2  . Years of Education: 14   Occupational History  . Nurse    Social  History Main Topics  . Smoking status: Never Smoker   . Smokeless tobacco: Never Used  . Alcohol Use: No  . Drug Use: No  . Sexually Active: Yes    Birth Control/ Protection: Surgical   Other Topics Concern  . Not on file   Social History Narrative   Regular exercise-no   Caffeine Use-yes     Constitutional: Pt reports headache and fever. Denies malaise, fatigue, or abrupt weight changes.  HEENT: Denies eye pain, eye redness, ear pain, ringing in the ears, wax buildup, runny nose, nasal congestion, bloody nose, or sore throat. Respiratory: Denies difficulty breathing, shortness of breath, cough or sputum production.   Cardiovascular: Denies chest pain, chest tightness, palpitations or swelling in the hands or feet.  Gastrointestinal: Pt reports nausea. Denies abdominal pain, bloating, constipation, diarrhea or blood in the stool.  Neurological: Denies dizziness, difficulty with memory, difficulty with speech or  problems with balance and coordination.   No other specific complaints in a complete review of systems (except as listed in HPI above).  Objective:   Physical Exam  BP 108/68  Pulse 108  Temp(Src) 98 F (36.7 C) (Oral)  Ht 5' 10.5" (1.791 m)  Wt 172 lb (78.019 kg)  BMI 24.32 kg/m2  SpO2 97% Wt Readings from Last 3 Encounters:  04/12/13 172 lb (78.019 kg)  03/02/13 165 lb 3.2 oz (74.934 kg)    General: Appears her stated age, well developed, well nourished in NAD. HEENT: Head: normal shape and size; Eyes: sclera white, no icterus, conjunctiva pink, PERRLA and EOMs intact; Ears: Tm's gray and intact, normal light reflex; Nose: mucosa pink and moist, septum midline; Throat/Mouth: Teeth present, mucosa pink and moist, no exudate, lesions or ulcerations noted.   Cardiovascular: Normal rate and rhythm. S1,S2 noted.  No murmur, rubs or gallops noted. No JVD or BLE edema. No carotid bruits noted. Pulmonary/Chest: Normal effort and positive vesicular breath sounds. No respiratory distress. No wheezes, rales or ronchi noted.  Abdomen: Soft and nontender. Normal bowel sounds, no bruits noted. No distention or masses noted. Liver, spleen and kidneys non palpable.  Neurological: Alert and oriented. Cranial nerves II-XII intact. Coordination normal. +DTRs bilaterally.    Assessment & Plan:   Given symptoms, concerned that patient is having a Behcet's flare:  Will place referral to rheumatology Given symptoms, pt needs further evaluation in ER to r/o meningitis Pt declines calling EMS, she will call her husband and he will take her to [place]  Will f/u after she leaves the hospitial

## 2013-04-12 NOTE — ED Notes (Signed)
Paged IV team to access pt's port.

## 2013-04-12 NOTE — ED Notes (Signed)
Paged IV team to deacess port.

## 2013-04-12 NOTE — ED Notes (Signed)
Pt c/o severe HA that started a month ago along with n/v X 3 days. Pt reports she has had intermittent fevers, checked it yesterday and it was 103. Pt sts she has had a hx of meningitis and gets a bad HA with it d/t increased ICP and had to get multiple spinal taps to decrease her ICP. Pt in nad, skin warm and dry, resp e/u. No facial droop or one sided weakness.

## 2013-04-12 NOTE — ED Notes (Signed)
IV team returned paged, will be here shortly.

## 2013-04-12 NOTE — ED Notes (Signed)
Pt reporting behcet's disease. C/o h/a, joint pain, nausea, fevers intermittently over past month. States seizure hx takes keppra but hasn't been taking for 3 days to vomiting it back up.

## 2013-04-12 NOTE — ED Notes (Signed)
IV team here to de-access port 

## 2013-04-12 NOTE — ED Notes (Signed)
Pt at CT when staff called to say pt was having seizure like activity, pt has hx of seizures but hasn't been able to keep her meds down. Staff reports pt had a focal seizure and was unresponsive for a few seconds. Afterwards pt was lethargic and unresponsive, VSS, pt in nad. RN assessed pt at CT. Pt unable to follow commands, PERRLA, VSS, airway intact. Upon arrival back to room, pt became more responsive, able to follow commands and speaking to staff.

## 2013-04-12 NOTE — Procedures (Signed)
No cx. See rad dictation.

## 2013-04-13 ENCOUNTER — Encounter: Payer: Self-pay | Admitting: Internal Medicine

## 2013-04-13 ENCOUNTER — Encounter: Payer: Self-pay | Admitting: *Deleted

## 2013-04-13 DIAGNOSIS — F4321 Adjustment disorder with depressed mood: Secondary | ICD-10-CM

## 2013-04-13 DIAGNOSIS — M352 Behcet's disease: Secondary | ICD-10-CM

## 2013-04-13 MED ORDER — ALPRAZOLAM 0.5 MG PO TABS: 0.5000 mg | ORAL_TABLET | Freq: Three times a day (TID) | ORAL | Status: DC | PRN

## 2013-04-14 ENCOUNTER — Encounter: Payer: Self-pay | Admitting: Internal Medicine

## 2013-04-14 ENCOUNTER — Encounter: Payer: Self-pay | Admitting: Neurology

## 2013-04-14 ENCOUNTER — Ambulatory Visit (INDEPENDENT_AMBULATORY_CARE_PROVIDER_SITE_OTHER): Payer: Federal, State, Local not specified - PPO | Admitting: Neurology

## 2013-04-14 VITALS — BP 134/87 | HR 99 | Ht 68.0 in | Wt 174.0 lb

## 2013-04-14 DIAGNOSIS — M352 Behcet's disease: Secondary | ICD-10-CM

## 2013-04-14 DIAGNOSIS — R51 Headache: Secondary | ICD-10-CM

## 2013-04-14 MED ORDER — PREDNISONE 10 MG PO TABS: ORAL_TABLET | ORAL | Status: DC

## 2013-04-14 NOTE — ED Provider Notes (Signed)
Medical screening examination/treatment/procedure(s) were performed by non-physician practitioner and as supervising physician I was immediately available for consultation/collaboration.   Charles B. Sheldon, MD 04/14/13 1456 

## 2013-04-14 NOTE — Progress Notes (Signed)
Reason for visit: Headache  Brenda Mcguire is a 52 y.o. female  History of present illness:  Brenda Mcguire is a 52 year old right-handed white female with a history of Behcet's disease. 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 The patient required treatment with Cytoxan for several months, and she had good resolution of her symptoms. The patient has been on Plaquenil since that time. The patient is also developed seizures, and she has been treated with Keppra without recurrence. The patient has had some problems with headaches off and on over the last month. The headache has significantly increased over the last 3 days. The patient has had episodes of fevers up to 103. The patient had an episode of fever of 101.8 one and one half days ago. The patient went to the emergency room, and she underwent a CT scan of the brain that was unremarkable, and she underwent a lumbar puncture that was traumatic. This showed no evidence of meningitis. The protein level was minimally elevated at 50. The patient has had a generalized headache that is throbbing in nature associated with some nausea. The patient denies any scalp tenderness. The patient denies any numbness or weakness of the extremities, or problems with balance or problems controlling the bowels or the bladder. The patient has not had any confusion. The patient is sent to this office for an evaluation.  Past Medical History  Diagnosis Date  . GERD (gastroesophageal reflux disease)   . Hyperlipidemia   . History of blood transfusion   . Seizures   . Chronic headaches   . Phlebitis   . History of colonic polyps   . Anxiety   . Behcet's disease     Past Surgical History  Procedure Laterality Date  . Cholecystectomy    . Tonsillectomy  1970  . Appendectomy  1975  . Abdominal hysterectomy  1988  .  Lumbar fusion  1999    L5-S1    Family History  Problem Relation Age of Onset  . Diabetes Mother   . Hyperlipidemia Mother   . Stroke Mother   . Hypertension Mother   . Colon cancer Mother   . Breast cancer Mother   . Early death Father   . Heart attack Father   . Heart disease Father   . Hyperlipidemia Father   . COPD Sister     Social history:  reports that she has never smoked. She has never used smokeless tobacco. She reports that she does not drink alcohol or use illicit drugs.  Medications:  Current Outpatient Prescriptions on File Prior to Visit  Medication Sig Dispense Refill  . ALPRAZolam (XANAX) 0.5 MG tablet Take 1 tablet (0.5 mg total) by mouth 3 (three) times daily as needed for sleep.  30 tablet  1  . HYDROcodone-acetaminophen (NORCO) 5-325 MG per tablet Take 1-2 tablets by mouth every 6 (six) hours as needed for pain.  20 tablet  0  . hydroxychloroquine (PLAQUENIL) 200 MG tablet Take 1 tablet (200 mg total) by mouth 2 (two) times daily.  60 tablet  3  . levETIRAcetam (KEPPRA) 500 MG tablet Take 1 tablet (500 mg total) by mouth 2 (two) times daily.  60 tablet  3  . ondansetron (ZOFRAN ODT) 4 MG disintegrating tablet 4mg  ODT q4 hours prn nausea/vomit  4 tablet  0  . traMADol (ULTRAM) 50 MG tablet Take 1  tablet (50 mg total) by mouth every 6 (six) hours as needed for pain.  30 tablet  2  . zolpidem (AMBIEN) 10 MG tablet Take 1 tablet (10 mg total) by mouth at bedtime as needed for sleep.  30 tablet  2   No current facility-administered medications on file prior to visit.    Allergies:  Allergies  Allergen Reactions  . Iohexol Anaphylaxis, Hives and Shortness Of Breath  . Codeine Itching  . Hepatitis B Vaccine   . Imitrex (Sumatriptan)     Makes her have svt  . Other     CT Contrast    ROS:  Out of a complete 14 system review of symptoms, the patient complains only of the following symptoms, and all other reviewed systems are negative.  Fevers, chills,  fatigue Blurred vision, occasional double vision Easy bruising Feeling cold Joint pain Numbness, generalized weakness Dizziness, history of seizures Insomnia  Blood pressure 134/87, pulse 99, height 5\' 8"  (1.727 m), weight 174 lb (78.926 kg).  Physical Exam  General: The patient is alert and cooperative at the time of the examination.  Head: Pupils are equal, round, and reactive to light. Discs are flat bilaterally.  Neck: The neck is supple, no carotid bruits are noted.  Respiratory: The respiratory examination is clear.  Cardiovascular: The cardiovascular examination reveals a regular rate and rhythm, no obvious murmurs or rubs are noted.  Skin: Extremities are without significant edema.  Neurologic Exam  Mental status:  Cranial nerves: Facial symmetry is present. There is good sensation of the face to pinprick and soft touch bilaterally. The strength of the facial muscles and the muscles to head turning and shoulder shrug are normal bilaterally. Speech is well enunciated, no aphasia or dysarthria is noted. Extraocular movements are full. Visual fields are full, with the exception that the patient may have some visual impairment in the left inferior quadrant.  Motor: The motor testing reveals 5 over 5 strength of all 4 extremities. Good symmetric motor tone is noted throughout.  Sensory: Sensory testing is intact to pinprick, soft touch, vibration sensation, and position sense on all 4 extremities, with the exception that there is some decrease in pinprick sensation of the left arm and leg, and decrease in vibration sensation of the left foot. No evidence of extinction is noted.  Coordination: Cerebellar testing reveals good finger-nose-finger and heel-to-shin bilaterally.  Gait and station: Gait is normal. Tandem gait is slightly unsteady. Romberg is negative. No drift is seen.  Reflexes: Deep tendon reflexes are symmetric and normal bilaterally, with the exception of some  increase in the left biceps reflex and the absence of the left ankle jerk reflex. Toes are equivocally upgoing on the right, definitely upgoing on the left.   Assessment/Plan:  1. Headache  2. History of Behcet's disease  The patient has a clinical examination that reveals some left-sided features. The patient has a decrease in pinprick sensation of the left arm and leg, and an increase in reflexes of the left arm, and a left sided Babinski. 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 The patient will be set up for MRI evaluation of the brain. The patient will be placed on a prednisone Dosepak. The patient will followup if needed. The patient has been referred to see Dr. Dareen Piano from rheumatology.  Marlan Palau MD 04/16/2013 5:00 PM  [facility] [address] [place], [place] [address]  Phone [phone] Fax [phone]

## 2013-04-14 NOTE — ED Provider Notes (Signed)
Medical screening examination/treatment/procedure(s) were performed by non-physician practitioner and as supervising physician I was immediately available for consultation/collaboration.   Dashia Caldeira L Simpson Paulos, MD 04/14/13 0527 

## 2013-04-16 LAB — CSF CULTURE W GRAM STAIN

## 2013-04-17 ENCOUNTER — Encounter: Payer: Self-pay | Admitting: Internal Medicine

## 2013-04-18 ENCOUNTER — Encounter: Payer: Self-pay | Admitting: Internal Medicine

## 2013-04-18 DIAGNOSIS — F329 Major depressive disorder, single episode, unspecified: Secondary | ICD-10-CM

## 2013-04-18 NOTE — Telephone Encounter (Signed)
See MyChart email interaction 04/17/13 re: same concern -

## 2013-04-21 ENCOUNTER — Encounter: Payer: Self-pay | Admitting: Internal Medicine

## 2013-04-29 ENCOUNTER — Encounter: Payer: Self-pay | Admitting: Internal Medicine

## 2013-05-01 ENCOUNTER — Encounter: Payer: Self-pay | Admitting: Internal Medicine

## 2013-05-01 DIAGNOSIS — F4321 Adjustment disorder with depressed mood: Secondary | ICD-10-CM

## 2013-05-02 MED ORDER — ALPRAZOLAM 0.5 MG PO TABS: 0.5000 mg | ORAL_TABLET | Freq: Three times a day (TID) | ORAL | Status: DC | PRN

## 2013-05-07 ENCOUNTER — Encounter: Payer: Self-pay | Admitting: Internal Medicine

## 2013-05-09 ENCOUNTER — Encounter: Payer: Self-pay | Admitting: Neurology

## 2013-05-09 MED ORDER — GABAPENTIN 300 MG PO CAPS: 600.0000 mg | ORAL_CAPSULE | Freq: Three times a day (TID) | ORAL | Status: DC

## 2013-05-10 ENCOUNTER — Encounter: Payer: Self-pay | Admitting: Internal Medicine

## 2013-05-14 ENCOUNTER — Encounter: Payer: Self-pay | Admitting: Neurology

## 2013-06-01 ENCOUNTER — Encounter: Payer: Self-pay | Admitting: Internal Medicine

## 2013-06-01 DIAGNOSIS — F4321 Adjustment disorder with depressed mood: Secondary | ICD-10-CM

## 2013-06-02 ENCOUNTER — Encounter: Payer: Self-pay | Admitting: Internal Medicine

## 2013-06-02 MED ORDER — ALPRAZOLAM 0.5 MG PO TABS: 0.5000 mg | ORAL_TABLET | Freq: Three times a day (TID) | ORAL | Status: DC | PRN

## 2013-06-02 MED ORDER — TRAMADOL HCL 50 MG PO TABS: 50.0000 mg | ORAL_TABLET | Freq: Three times a day (TID) | ORAL | Status: DC | PRN

## 2013-06-03 ENCOUNTER — Encounter: Payer: Self-pay | Admitting: Internal Medicine

## 2013-06-03 ENCOUNTER — Other Ambulatory Visit: Payer: Self-pay | Admitting: Internal Medicine

## 2013-06-04 ENCOUNTER — Encounter: Payer: Self-pay | Admitting: Internal Medicine

## 2013-06-05 MED ORDER — PROMETHAZINE HCL 12.5 MG PO TABS: 12.5000 mg | ORAL_TABLET | Freq: Four times a day (QID) | ORAL | Status: DC | PRN

## 2013-06-18 ENCOUNTER — Encounter: Payer: Self-pay | Admitting: Internal Medicine

## 2013-07-01 ENCOUNTER — Encounter: Payer: Self-pay | Admitting: Internal Medicine

## 2013-07-04 ENCOUNTER — Encounter: Payer: Self-pay | Admitting: Internal Medicine

## 2013-07-04 ENCOUNTER — Other Ambulatory Visit: Payer: Self-pay | Admitting: Internal Medicine

## 2013-07-04 MED ORDER — TRAMADOL HCL 50 MG PO TABS: 50.0000 mg | ORAL_TABLET | Freq: Three times a day (TID) | ORAL | Status: DC | PRN

## 2013-07-11 ENCOUNTER — Encounter: Payer: Self-pay | Admitting: Internal Medicine

## 2013-07-24 ENCOUNTER — Encounter: Payer: Self-pay | Admitting: Internal Medicine

## 2013-07-24 DIAGNOSIS — F4321 Adjustment disorder with depressed mood: Secondary | ICD-10-CM

## 2013-07-24 MED ORDER — PROMETHAZINE HCL 12.5 MG PO TABS: 12.5000 mg | ORAL_TABLET | Freq: Four times a day (QID) | ORAL | Status: DC | PRN

## 2013-07-24 MED ORDER — ALPRAZOLAM 0.5 MG PO TABS: 0.5000 mg | ORAL_TABLET | Freq: Three times a day (TID) | ORAL | Status: DC | PRN

## 2013-07-29 ENCOUNTER — Encounter: Payer: Self-pay | Admitting: Internal Medicine

## 2013-08-02 ENCOUNTER — Encounter: Payer: Self-pay | Admitting: Internal Medicine

## 2013-08-03 ENCOUNTER — Encounter: Payer: Self-pay | Admitting: Internal Medicine

## 2013-08-03 ENCOUNTER — Other Ambulatory Visit: Payer: Self-pay | Admitting: Internal Medicine

## 2013-08-03 DIAGNOSIS — F4321 Adjustment disorder with depressed mood: Secondary | ICD-10-CM

## 2013-08-04 ENCOUNTER — Encounter: Payer: Self-pay | Admitting: Internal Medicine

## 2013-08-04 ENCOUNTER — Other Ambulatory Visit: Payer: Self-pay | Admitting: *Deleted

## 2013-08-04 MED ORDER — TRAMADOL HCL 50 MG PO TABS: 50.0000 mg | ORAL_TABLET | Freq: Three times a day (TID) | ORAL | Status: DC | PRN

## 2013-08-04 MED ORDER — ALPRAZOLAM 0.5 MG PO TABS: 0.5000 mg | ORAL_TABLET | Freq: Three times a day (TID) | ORAL | Status: DC | PRN

## 2013-08-04 MED ORDER — ZOLPIDEM TARTRATE 10 MG PO TABS: ORAL_TABLET | ORAL | Status: DC

## 2013-08-04 MED ORDER — GABAPENTIN 300 MG PO CAPS: ORAL_CAPSULE | ORAL | Status: DC

## 2013-08-04 NOTE — Telephone Encounter (Signed)
Rene Kocher out of office. Pls advise...lmb

## 2013-08-04 NOTE — Telephone Encounter (Signed)
Sent e-mail requesting refills on her tramadol, ambien, and neurontin. Sent neurontin pls advise on other two. Per md ok to fill in [facility]...Raechel Chute

## 2013-08-04 NOTE — Telephone Encounter (Signed)
Faxed script back to [facility].../lmb 

## 2013-08-08 ENCOUNTER — Encounter: Payer: Self-pay | Admitting: Internal Medicine

## 2013-08-10 ENCOUNTER — Encounter: Payer: Self-pay | Admitting: Internal Medicine

## 2013-08-10 MED ORDER — GLUCAGON (RDNA) 1 MG IJ KIT: 1.0000 mg | PACK | Freq: Once | INTRAMUSCULAR | Status: DC | PRN

## 2013-08-10 MED ORDER — PROMETHAZINE HCL 25 MG/ML IJ SOLN: 12.5000 mg | Freq: Once | INTRAMUSCULAR | Status: DC

## 2013-08-15 ENCOUNTER — Encounter: Payer: Self-pay | Admitting: Internal Medicine

## 2013-08-16 ENCOUNTER — Encounter: Payer: Self-pay | Admitting: Internal Medicine

## 2013-08-21 ENCOUNTER — Encounter: Payer: Self-pay | Admitting: Internal Medicine

## 2013-08-21 MED ORDER — MIRTAZAPINE 15 MG PO TBDP: 15.0000 mg | ORAL_TABLET | Freq: Every day | ORAL | Status: DC

## 2013-08-22 MED ORDER — ZOLPIDEM TARTRATE ER 12.5 MG PO TBCR: 12.5000 mg | EXTENDED_RELEASE_TABLET | Freq: Every evening | ORAL | Status: DC | PRN

## 2013-08-25 ENCOUNTER — Emergency Department: Payer: Self-pay | Admitting: Emergency Medicine

## 2013-08-25 ENCOUNTER — Inpatient Hospital Stay (HOSPITAL_COMMUNITY)
Admission: AD | Admit: 2013-08-25 | Discharge: 2013-08-29 | DRG: 024 | Disposition: A | Discharge status: Home / Self Care | Payer: Federal, State, Local not specified - PPO | Source: Other Acute Inpatient Hospital | Attending: Internal Medicine | Admitting: Internal Medicine

## 2013-08-25 ENCOUNTER — Encounter: Payer: Self-pay | Admitting: Internal Medicine

## 2013-08-25 DIAGNOSIS — R519 Headache, unspecified: Secondary | ICD-10-CM | POA: Diagnosis present

## 2013-08-25 DIAGNOSIS — Z981 Arthrodesis status: Secondary | ICD-10-CM

## 2013-08-25 DIAGNOSIS — R4189 Other symptoms and signs involving cognitive functions and awareness: Secondary | ICD-10-CM

## 2013-08-25 DIAGNOSIS — E785 Hyperlipidemia, unspecified: Secondary | ICD-10-CM | POA: Diagnosis present

## 2013-08-25 DIAGNOSIS — G4700 Insomnia, unspecified: Secondary | ICD-10-CM | POA: Diagnosis present

## 2013-08-25 DIAGNOSIS — F411 Generalized anxiety disorder: Secondary | ICD-10-CM | POA: Diagnosis present

## 2013-08-25 DIAGNOSIS — M352 Behcet's disease: Secondary | ICD-10-CM | POA: Diagnosis present

## 2013-08-25 DIAGNOSIS — Z79899 Other long term (current) drug therapy: Secondary | ICD-10-CM

## 2013-08-25 DIAGNOSIS — R569 Unspecified convulsions: Principal | ICD-10-CM | POA: Diagnosis present

## 2013-08-25 DIAGNOSIS — K219 Gastro-esophageal reflux disease without esophagitis: Secondary | ICD-10-CM | POA: Diagnosis present

## 2013-08-25 DIAGNOSIS — R51 Headache: Secondary | ICD-10-CM | POA: Diagnosis present

## 2013-08-25 LAB — URINALYSIS, COMPLETE
Bacteria: NONE SEEN
Bilirubin,UR: NEGATIVE
Blood: NEGATIVE
Glucose,UR: NEGATIVE mg/dL (ref 0–75)
Leukocyte Esterase: NEGATIVE
Nitrite: NEGATIVE
WBC UR: 1 /HPF (ref 0–5)

## 2013-08-25 LAB — ETHANOL: Ethanol %: <0.003 % (ref 0.000–0.080)

## 2013-08-25 LAB — CBC
HCT: 33.6 % — ABNORMAL LOW (ref 35.0–47.0)
HGB: 11.4 g/dL — ABNORMAL LOW (ref 12.0–16.0)
MCH: 30.7 pg (ref 26.0–34.0)
MCHC: 34 g/dL (ref 32.0–36.0)
MCHC: 34.6 g/dL (ref 30.0–36.0)
MCV: 88.5 fL (ref 78.0–100.0)
Platelet: 272 10*3/uL (ref 150–440)
Platelets: 279 10*3/uL (ref 150–400)
RDW: 12.9 % (ref 11.5–15.5)
WBC: 13.3 10*3/uL — ABNORMAL HIGH (ref 3.6–11.0)

## 2013-08-25 LAB — COMPREHENSIVE METABOLIC PANEL
AST: 16 U/L (ref 0–37)
Albumin: 4.2 g/dL (ref 3.4–5.0)
Albumin: 3.7 g/dL (ref 3.5–5.2)
Alkaline Phosphatase: 101 U/L (ref 50–136)
Alkaline Phosphatase: 79 U/L (ref 39–117)
BUN: 11 mg/dL (ref 6–23)
Calcium, Total: 9.1 mg/dL (ref 8.5–10.1)
Chloride: 107 mmol/L (ref 98–107)
Chloride: 105 mEq/L (ref 96–112)
EGFR (African American): >60
EGFR (Non-African Amer.): >60
Glucose: 122 mg/dL — ABNORMAL HIGH (ref 65–99)
Osmolality: 278 (ref 275–301)
Potassium: 3.8 mmol/L (ref 3.5–5.1)
Potassium: 4 mEq/L (ref 3.5–5.1)
SGOT(AST): 29 U/L (ref 15–37)
Total Bilirubin: 0.3 mg/dL (ref 0.3–1.2)
Total Protein: 7.8 g/dL (ref 6.4–8.2)

## 2013-08-25 LAB — DRUG SCREEN, URINE
Barbiturates, Ur Screen: NEGATIVE (ref ?–200)
Benzodiazepine, Ur Scrn: POSITIVE (ref ?–200)
Cannabinoid 50 Ng, Ur ~~LOC~~: NEGATIVE (ref ?–50)
Cocaine Metabolite,Ur ~~LOC~~: NEGATIVE (ref ?–300)
MDMA (Ecstasy)Ur Screen: NEGATIVE (ref ?–500)
Opiate, Ur Screen: NEGATIVE (ref ?–300)
Tricyclic, Ur Screen: POSITIVE (ref ?–1000)

## 2013-08-25 LAB — HEMOGLOBIN A1C: Hemoglobin A1C: 5.8 % (ref 4.2–6.3)

## 2013-08-25 MED ORDER — LEVETIRACETAM 500 MG PO TABS
500.0000 mg | ORAL_TABLET | Freq: Two times a day (BID) | ORAL | Status: DC
  Administered 2013-08-25: 500 mg via ORAL
  Filled 2013-08-25: qty 1

## 2013-08-25 MED ORDER — DIVALPROEX SODIUM 500 MG PO DR TAB
750.0000 mg | DELAYED_RELEASE_TABLET | Freq: Three times a day (TID) | ORAL | Status: DC
  Administered 2013-08-25 – 2013-08-27 (×6): 750 mg via ORAL
  Filled 2013-08-25 (×7): qty 1

## 2013-08-25 MED ORDER — PHENYTOIN SODIUM 50 MG/ML IJ SOLN
100.0000 mg | Freq: Three times a day (TID) | INTRAMUSCULAR | Status: DC
  Administered 2013-08-25: 100 mg via INTRAVENOUS
  Filled 2013-08-25 (×2): qty 2

## 2013-08-25 MED ORDER — TRAMADOL HCL 50 MG PO TABS
50.0000 mg | ORAL_TABLET | Freq: Three times a day (TID) | ORAL | Status: DC | PRN
  Administered 2013-08-26 (×2): 50 mg via ORAL
  Filled 2013-08-25: qty 1

## 2013-08-25 MED ORDER — GABAPENTIN 300 MG PO CAPS
600.0000 mg | ORAL_CAPSULE | Freq: Three times a day (TID) | ORAL | Status: DC
  Administered 2013-08-25 – 2013-08-27 (×6): 600 mg via ORAL
  Filled 2013-08-25 (×7): qty 2

## 2013-08-25 MED ORDER — ONDANSETRON HCL 4 MG/2ML IJ SOLN
4.0000 mg | Freq: Four times a day (QID) | INTRAMUSCULAR | Status: DC | PRN
  Administered 2013-08-25 – 2013-08-26 (×2): 4 mg via INTRAVENOUS
  Filled 2013-08-25 (×3): qty 2

## 2013-08-25 MED ORDER — HYDROXYCHLOROQUINE SULFATE 200 MG PO TABS
200.0000 mg | ORAL_TABLET | Freq: Two times a day (BID) | ORAL | Status: DC
  Administered 2013-08-25 – 2013-08-29 (×8): 200 mg via ORAL
  Filled 2013-08-25 (×9): qty 1

## 2013-08-25 MED ORDER — MENTHOL 3 MG MT LOZG: 1.0000 | LOZENGE | OROMUCOSAL | Status: DC | PRN

## 2013-08-25 MED ORDER — ENOXAPARIN SODIUM 40 MG/0.4ML ~~LOC~~ SOLN
40.0000 mg | SUBCUTANEOUS | Status: DC
  Administered 2013-08-25 – 2013-08-28 (×4): 40 mg via SUBCUTANEOUS
  Filled 2013-08-25 (×5): qty 0.4

## 2013-08-25 MED ORDER — FAMOTIDINE 20 MG PO TABS
20.0000 mg | ORAL_TABLET | Freq: Every day | ORAL | Status: DC
  Administered 2013-08-25 – 2013-08-29 (×5): 20 mg via ORAL
  Filled 2013-08-25 (×5): qty 1

## 2013-08-25 MED ORDER — ALPRAZOLAM 0.5 MG PO TABS
0.5000 mg | ORAL_TABLET | Freq: Three times a day (TID) | ORAL | Status: DC | PRN
  Administered 2013-08-25 – 2013-08-27 (×4): 0.5 mg via ORAL
  Filled 2013-08-25 (×4): qty 1

## 2013-08-25 NOTE — H&P (Signed)
[facility] History and Physical  Patient: Brenda Mcguire  UXL:[id number]  DOB: Jun 13, 1961  DOA: 08/25/2013  Referring physician: [facility] PCP: Nicki Reaper, NP  Specialists: Dr. Thad Ranger  Chief Complaint: Seizure  HPI: Brenda Mcguire is a 52 y.o. female with Past medical history of seizure and pseudoseizure, behcet's disease, anxiety, GERD. She presented today at [facility] with a complaint of loss of consciousness. When the EMS brought her to mention that she had 3 seizures which was witnessed by her coworkers at work and 2 seizures with EMS on route. The patient was not conscious between the episodes. Patient remembers the events prior to the seizures, but currently does not remember anything in between. She denies any complaint of fever, chills, headache, cough, chest pain, palpitation, shortness of breath, orthopnea, PND, nausea, vomiting, abdominal pain, diarrhea, constipation, active bleeding, burning urination, dizziness, pedal edema,  focal neurological deficit. There is no recent medication change, trauma, or fall. She currently complains of sore throat but other than that she does not have any active complaint. Patient was intubated in [facility] initially, was loaded with fosphenytoin and Dilantin. After that she woke up there and requested to be extubated, she tolerated the extubation well and was transferred here for further workup. Currently she denies any respiratory symptoms.   Review of Systems: as mentioned in the history of present illness.  A Comprehensive review of the other systems is negative.  Past Medical History  Diagnosis Date  . GERD (gastroesophageal reflux disease)   . Hyperlipidemia   . History of blood transfusion   . Seizures   . Chronic headaches   . Phlebitis   . History of colonic polyps   . Anxiety   . Behcet's disease    Past Surgical History  Procedure Laterality Date  . Cholecystectomy    . Tonsillectomy  1970  . Appendectomy  1975  . Abdominal  hysterectomy  1988  . Lumbar fusion  1999    L5-S1   Social History:  reports that she has never smoked. She has never used smokeless tobacco. She reports that she does not drink alcohol or use illicit drugs. Patient is coming from home. Independent for most of her  ADL.  Allergies  Allergen Reactions  . Iohexol Anaphylaxis, Hives and Shortness Of Breath  . Codeine Itching  . Hepatitis B Vaccine   . 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 Other     CT Contrast    Family History  Problem Relation Age of Onset  . Diabetes Mother   . Hyperlipidemia Mother   . Stroke Mother   . Hypertension Mother   . Colon cancer Mother   . Breast cancer Mother   . Early death Father   . Heart attack Father   . Heart disease Father   . Hyperlipidemia Father   . COPD Sister     Prior to Admission medications   Medication Sig Start Date End Date Taking? Authorizing Provider  ALPRAZolam Prudy Feeler) 0.5 MG tablet Take 1 tablet (0.5 mg total) by mouth 3 (three) times daily as needed for sleep. 08/04/13  Yes Newt Lukes, MD  divalproex (DEPAKOTE) 250 MG DR tablet Take 750 mg by mouth 3 (three) times daily.   Yes Historical Provider, MD  gabapentin (NEURONTIN) 300 MG capsule Take 600 mg by mouth 3 (three) times daily.   Yes Historical Provider, MD  glucagon 1 MG injection Inject 1 mg into the vein once as needed. 08/10/13  Yes  Nicki Reaper, NP  HYDROcodone-acetaminophen (NORCO) 5-325 MG per tablet Take 1-2 tablets by mouth every 6 (six) hours as needed for pain. 04/12/13  Yes Arthor Captain, PA-C  hydroxychloroquine (PLAQUENIL) 200 MG tablet Take 1 tablet (200 mg total) by mouth 2 (two) times daily. 03/02/13  Yes Nicki Reaper, NP  levETIRAcetam (KEPPRA) 500 MG tablet Take 1 tablet (500 mg total) by mouth 2 (two) times daily. 03/02/13  Yes Nicki Reaper, NP  mirtazapine (REMERON SOL-TAB) 15 MG disintegrating tablet Take 1 tablet (15 mg total) by mouth at bedtime. 08/21/13  Yes Nicki Reaper, NP   ondansetron (ZOFRAN ODT) 4 MG disintegrating tablet 4mg  ODT q4 hours prn nausea/vomit 04/12/13  Yes Abigail Harris, PA-C  promethazine (PHENERGAN) 12.5 MG tablet Take 1 tablet (12.5 mg total) by mouth every 6 (six) hours as needed for nausea. 07/24/13  Yes Nicki Reaper, NP  traMADol (ULTRAM) 50 MG tablet Take 1 tablet (50 mg total) by mouth every 8 (eight) hours as needed for pain. 08/04/13  Yes Newt Lukes, MD  zolpidem (AMBIEN CR) 12.5 MG CR tablet Take 1 tablet (12.5 mg total) by mouth at bedtime as needed for sleep. 08/22/13  Yes Nicki Reaper, NP    Physical Exam: Filed Vitals:   08/25/13 1900  BP: 114/68  Pulse: 99  Temp: 99.3 F (37.4 C)  TempSrc: Oral  Resp: 16  Height: 5\' 10"  (1.778 m)  SpO2: 99%    General: Alert, Awake and Oriented to Time, Place and Person. Appear in mild distress Eyes: PERRL ENT: Oral Mucosa clear moist. Neck: No  JVD, no  Carotid Bruits  Cardiovascular: S1 and S2 Present, no Murmur, Peripheral Pulses Present Respiratory: Bilateral Air entry equal and Decreased, Clear to Auscultation,  No  Crackles,no  wheezes Abdomen: Bowel Sound Present, Soft and Non tender Skin: No  Rash Extremities: No  Pedal edema, no  calf tenderness Neurologic: Grossly Unremarkable.  Labs on Admission:  CBC:  Recent Labs Lab 08/25/13 2253  WBC 9.1  HGB 11.5*  HCT 33.2*  MCV 88.5  PLT 279    CMP     Component Value Date/Time   NA 140 08/25/2013 2253   K 4.0 08/25/2013 2253   CL 105 08/25/2013 2253   CO2 22 08/25/2013 2253   GLUCOSE 129* 08/25/2013 2253   BUN 11 08/25/2013 2253   CREATININE 0.64 08/25/2013 2253   CALCIUM 9.6 08/25/2013 2253   PROT 7.3 08/25/2013 2253   ALBUMIN 3.7 08/25/2013 2253   AST 16 08/25/2013 2253   ALT 13 08/25/2013 2253   ALKPHOS 79 08/25/2013 2253   BILITOT 0.3 08/25/2013 2253   GFRNONAA >90 08/25/2013 2253   GFRAA >90 08/25/2013 2253    No results found for this basename: LIPASE, AMYLASE,  in the last 168 hours No results found for  this basename: AMMONIA,  in the last 168 hours  Cardiac Enzymes:  Recent Labs Lab 08/25/13 2253 08/25/13 2255  CKTOTAL 222*  --   TROPONINI  --  <0.30    BNP (last 3 results) No results found for this basename: PROBNP,  in the last 8760 hours  Radiological Exams on Admission: No results found.  Assessment/Plan Principal Problem:   Seizures Active Problems:   Insomnia   Headache(784.0)   GERD (gastroesophageal reflux disease)   1. Seizures The patient presented with complain of seizure and loss of consciousness. Currently she does not have any focal neurological deficit. It is unclear what precipitated the seizure. Also she does have  history of pseudoseizures in the past as per documentation. Her Depakote level appears within normal limits. Neurologic has been consulted and they will be following recommendation. At present she has already been started on Dilantin in the other hospital and I would continue the Dilantin. I would continue her on her home dose of Depakote as well as gabapentin. Patient will be kept on seizure prophylaxis. Patient will be monitored on telemetry.  2. GERD Continue Protonix  3. Recent extubation Sepracor lozenges for soothing. Patient does not appear to have any stridor. Monitor on telemetry and pulse oximetry  DVT Prophylaxis: subcutaneous Heparin Nutrition: As tolerated  Code Status: Full   Author: Lynden Oxford, MD [facility] Pager: [phone] 08/25/2013, 8:57 PM    If 7PM-7AM, please contact night-coverage [email or web address] Password [email or web address]

## 2013-08-26 ENCOUNTER — Inpatient Hospital Stay (HOSPITAL_COMMUNITY): Payer: Federal, State, Local not specified - PPO

## 2013-08-26 ENCOUNTER — Encounter (HOSPITAL_COMMUNITY): Payer: Self-pay | Admitting: *Deleted

## 2013-08-26 LAB — PROTIME-INR: INR: 1.11 (ref 0.00–1.49)

## 2013-08-26 LAB — COMPREHENSIVE METABOLIC PANEL
ALT: 11 U/L (ref 0–35)
AST: 15 U/L (ref 0–37)
Albumin: 3.5 g/dL (ref 3.5–5.2)
CO2: 24 mEq/L (ref 19–32)
Calcium: 8.8 mg/dL (ref 8.4–10.5)
GFR calc non Af Amer: >90 mL/min (ref >90)
Sodium: 140 mEq/L (ref 135–145)

## 2013-08-26 LAB — TSH
TSH: 0.104 u[IU]/mL — ABNORMAL LOW (ref 0.350–4.500)
TSH: 0.204 u[IU]/mL — ABNORMAL LOW (ref 0.350–4.500)

## 2013-08-26 LAB — GLUCOSE, CAPILLARY
Glucose-Capillary: 126 mg/dL — ABNORMAL HIGH (ref 70–99)
Glucose-Capillary: 86 mg/dL (ref 70–99)

## 2013-08-26 LAB — CBC WITH DIFFERENTIAL/PLATELET
Basophils Absolute: 0 10*3/uL (ref 0.0–0.1)
Basophils Relative: 0 % (ref 0–1)
Eosinophils Relative: 0 % (ref 0–5)
Lymphocytes Relative: 34 % (ref 12–46)
MCV: 90 fL (ref 78.0–100.0)
Neutro Abs: 5.3 10*3/uL (ref 1.7–7.7)
Platelets: 231 10*3/uL (ref 150–400)
RDW: 13.1 % (ref 11.5–15.5)
WBC: 9.1 10*3/uL (ref 4.0–10.5)

## 2013-08-26 MED ORDER — METOCLOPRAMIDE HCL 5 MG/ML IJ SOLN
10.0000 mg | Freq: Once | INTRAMUSCULAR | Status: AC
  Administered 2013-08-26: 10 mg via INTRAVENOUS
  Filled 2013-08-26: qty 2

## 2013-08-26 MED ORDER — TRAMADOL HCL 50 MG PO TABS
ORAL_TABLET | ORAL | Status: AC
  Filled 2013-08-26: qty 1

## 2013-08-26 MED ORDER — LORAZEPAM 2 MG/ML IJ SOLN
0.5000 mg | Freq: Once | INTRAMUSCULAR | Status: AC
  Administered 2013-08-26: 0.5 mg via INTRAVENOUS
  Filled 2013-08-26: qty 1

## 2013-08-26 MED ORDER — ONDANSETRON HCL 4 MG/2ML IJ SOLN
8.0000 mg | INTRAMUSCULAR | Status: DC | PRN
  Administered 2013-08-26: 8 mg via INTRAVENOUS
  Administered 2013-08-26 (×2): 4 mg via INTRAVENOUS
  Administered 2013-08-27 – 2013-08-28 (×3): 8 mg via INTRAVENOUS
  Administered 2013-08-28: 4 mg via INTRAVENOUS
  Filled 2013-08-26 (×5): qty 4

## 2013-08-26 MED ORDER — LEVETIRACETAM 750 MG PO TABS
750.0000 mg | ORAL_TABLET | Freq: Two times a day (BID) | ORAL | Status: DC
  Administered 2013-08-26 – 2013-08-29 (×7): 750 mg via ORAL
  Filled 2013-08-26 (×8): qty 1

## 2013-08-26 MED ORDER — SODIUM CHLORIDE 0.9 % IJ SOLN
10.0000 mL | INTRAMUSCULAR | Status: DC | PRN
  Administered 2013-08-26 – 2013-08-29 (×2): 10 mL

## 2013-08-26 MED ORDER — KETOROLAC TROMETHAMINE 30 MG/ML IJ SOLN
30.0000 mg | Freq: Once | INTRAMUSCULAR | Status: AC
  Administered 2013-08-26: 30 mg via INTRAVENOUS
  Filled 2013-08-26: qty 1

## 2013-08-26 MED ORDER — ONDANSETRON HCL 4 MG/2ML IJ SOLN
INTRAMUSCULAR | Status: AC
  Filled 2013-08-26: qty 2

## 2013-08-26 MED ORDER — MORPHINE SULFATE 2 MG/ML IJ SOLN
2.0000 mg | Freq: Once | INTRAMUSCULAR | Status: AC
  Administered 2013-08-26: 2 mg via INTRAVENOUS

## 2013-08-26 MED ORDER — HYDROCODONE-ACETAMINOPHEN 5-325 MG PO TABS
1.0000 | ORAL_TABLET | Freq: Once | ORAL | Status: AC
  Administered 2013-08-26: 1 via ORAL
  Filled 2013-08-26: qty 1

## 2013-08-26 MED ORDER — ALBUTEROL SULFATE (5 MG/ML) 0.5% IN NEBU
2.5000 mg | INHALATION_SOLUTION | RESPIRATORY_TRACT | Status: DC | PRN
  Administered 2013-08-26 – 2013-08-28 (×3): 2.5 mg via RESPIRATORY_TRACT
  Filled 2013-08-26 (×2): qty 0.5

## 2013-08-26 MED ORDER — SODIUM CHLORIDE 0.9 % IV BOLUS (SEPSIS)
500.0000 mL | Freq: Once | INTRAVENOUS | Status: AC
  Administered 2013-08-26: 500 mL via INTRAVENOUS

## 2013-08-26 MED ORDER — ACETAMINOPHEN 325 MG PO TABS
650.0000 mg | ORAL_TABLET | Freq: Four times a day (QID) | ORAL | Status: DC | PRN
  Administered 2013-08-26 – 2013-08-28 (×3): 650 mg via ORAL
  Filled 2013-08-26 (×3): qty 2

## 2013-08-26 MED ORDER — DIPHENHYDRAMINE HCL 50 MG/ML IJ SOLN
25.0000 mg | Freq: Once | INTRAMUSCULAR | Status: AC
  Administered 2013-08-26: 25 mg via INTRAVENOUS
  Filled 2013-08-26: qty 1

## 2013-08-26 MED ORDER — PROMETHAZINE HCL 25 MG/ML IJ SOLN
12.5000 mg | Freq: Once | INTRAMUSCULAR | Status: AC
  Administered 2013-08-26: 12.5 mg via INTRAVENOUS
  Filled 2013-08-26: qty 1

## 2013-08-26 MED ORDER — MORPHINE SULFATE 2 MG/ML IJ SOLN
INTRAMUSCULAR | Status: AC
  Filled 2013-08-26: qty 1

## 2013-08-26 MED ORDER — HYDROCODONE-ACETAMINOPHEN 5-325 MG PO TABS
1.0000 | ORAL_TABLET | Freq: Four times a day (QID) | ORAL | Status: DC | PRN
  Administered 2013-08-26 – 2013-08-27 (×3): 1 via ORAL
  Filled 2013-08-26 (×3): qty 1

## 2013-08-26 MED ORDER — PIPERACILLIN-TAZOBACTAM 3.375 G IVPB
3.3750 g | Freq: Three times a day (TID) | INTRAVENOUS | Status: DC
  Administered 2013-08-26 – 2013-08-29 (×8): 3.375 g via INTRAVENOUS
  Filled 2013-08-26 (×10): qty 50

## 2013-08-26 MED ORDER — SODIUM CHLORIDE 0.9 % IV SOLN
INTRAVENOUS | Status: DC
  Administered 2013-08-26 (×3): via INTRAVENOUS

## 2013-08-26 NOTE — Progress Notes (Signed)
NEURO HOSPITALIST PROGRESS NOTE   SUBJECTIVE:                                                                                                                        No further seizures noted. Complains of HA and blurred vision. On keppra 750 mg BID and depakote 750 mg TID.  Valproic acid 56.2 MRI brain pending.  OBJECTIVE:                                                                                                                           Vital signs in last 24 hours: Temp:  [98.3 F (36.8 C)-99.4 F (37.4 C)] 98.3 F (36.8 C) (09/13 0705) Pulse Rate:  [83-103] 83 (09/13 1033) Resp:  [16-18] 18 (09/13 1033) BP: (87-118)/(56-68) 90/57 mmHg (09/13 1033) SpO2:  [96 %-99 %] 97 % (09/13 1033) Weight:  [78.563 kg (173 lb 3.2 oz)] 78.563 kg (173 lb 3.2 oz) (09/12 1900)  Intake/Output from previous day: 09/12 0701 - 09/13 0700 In: -  Out: 1 [Urine:1] Intake/Output this shift:   Nutritional status: NPO  Past Medical History  Diagnosis Date  . GERD (gastroesophageal reflux disease)   . Hyperlipidemia   . History of blood transfusion   . Seizures   . Chronic headaches   . Phlebitis   . History of colonic polyps   . Anxiety   . Behcet's disease     Neurologic Exam:  Mental Status:  Alert, oriented, thought content appropriate. Speech fluent without evidence of aphasia. Able to follow 3 step commands without difficulty.  Cranial Nerves:  II: Discs flat bilaterally; Visual fields grossly normal, pupils equal, round, reactive to light and accommodation  III,IV, VI: ptosis not present, extra-ocular motions intact bilaterally  V,VII: smile symmetric, facial light touch sensation normal bilaterally  VIII: hearing normal bilaterally  IX,X: gag reflex present  XI: bilateral shoulder shrug  XII: midline tongue extension  Motor:  Right : Upper extremity 5/5 Left: Upper extremity 5/5  Lower extremity 5/5 Lower extremity 5/5  Tone and  bulk:normal tone throughout; no atrophy noted  Sensory: Pinprick and light touch intact throughout, bilaterally, excluding the last two toes on the left foot  Deep Tendon Reflexes: 2+ and symmetric throughout  Plantars:  Right: upgoing Left: upgoing  Cerebellar:  normal finger-to-nose and normal heel-to-shin test  Gait: Unable to test  CV: pulses palpable throughout    Lab Results: Lab Results  Component Value Date/Time   CHOL 234* 03/02/2013 11:15 AM   Lipid Panel No results found for this basename: CHOL, TRIG, HDL, CHOLHDL, VLDL, LDLCALC,  in the last 72 hours  Studies/Results: No results found.  MEDICATIONS                                                                                                                       I have reviewed the patient's current medications.  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 Now back to baseline. Continue current anticonvulsants. Awaiting MRI brain. Will follow up.     Brenda Portela, MD [facility] [phone]  08/26/2013, 10:40 AM

## 2013-08-26 NOTE — Progress Notes (Signed)
[facility] PROGRESS NOTE  Brenda Mcguire UJW:[id number] DOB: 12-Apr-1961 DOA: 08/25/2013 PCP: Nicki Reaper, NP  Assessment/Plan: 52 y.o. female with Past medical history of seizure and pseudoseizure, behcet's disease, anxiety, GERD admitted with seizures   1. Seizures of unclear etiology (5 times, intubated/extubated same day); no seizure since admission 9/13 -? Tramadol induced; patient takes prn last dose on 9/10; d/w recommended to stop tramadol  -pend MRI, EEG; cont keppra, depakote per neurology  -? Need LP; headaches+nausea ? Increased ICP; h/o multiples LP in the past; defer LP to neurologist; appreciate co management    2. GERD, stable cont PPI -nausea cont zofran   3. S/p extubation for airway protection with seizures  -mild decreased AE in LLL lung; will obtain CXR  4. H/o low tsh ; recheck tsh   Code Status: full  Family Communication: family not present at the bedside  (indicate person spoken with, relationship, and if by phone, the number) Disposition Plan: home    Consultants:  Neurology   Procedures:    Antibiotics:  No  (indicate start date, and stop date if known)  HPI/Subjective: Alert, reports headache   Objective: Filed Vitals:   08/26/13 0705  BP: 87/58  Pulse: 83  Temp: 98.3 F (36.8 C)  Resp: 16    Intake/Output Summary (Last 24 hours) at 08/26/13 0859 Last data filed at 08/26/13 0011  Gross per 24 hour  Intake      0 ml  Output      1 ml  Net     -1 ml   Filed Weights   08/25/13 1900  Weight: 78.563 kg (173 lb 3.2 oz)    Exam:   General:  Alert, oriented   Cardiovascular: S1, S2 RRR  Respiratory: decreased AE in L lung   Abdomen: soft, NT, ND   Musculoskeletal: noLE edema    Data Reviewed: Basic Metabolic Panel:  Recent Labs Lab 08/25/13 2253 08/26/13 0500  NA 140 140  K 4.0 3.8  CL 105 106  CO2 22 24  GLUCOSE 129* 83  BUN 11 13  CREATININE 0.64 0.72  CALCIUM 9.6 8.8   Liver Function  Tests:  Recent Labs Lab 08/25/13 2253 08/26/13 0500  AST 16 15  ALT 13 11  ALKPHOS 79 68  BILITOT 0.3 0.4  PROT 7.3 6.5  ALBUMIN 3.7 3.5   No results found for this basename: LIPASE, AMYLASE,  in the last 168 hours No results found for this basename: AMMONIA,  in the last 168 hours CBC:  Recent Labs Lab 08/25/13 2253 08/26/13 0500  WBC 9.1 9.1  NEUTROABS  --  5.3  HGB 11.5* 10.4*  HCT 33.2* 30.6*  MCV 88.5 90.0  PLT 279 231   Cardiac Enzymes:  Recent Labs Lab 08/25/13 2253 08/25/13 2255 08/26/13 0500  CKTOTAL 222*  --  212*  TROPONINI  --  <0.30  --    BNP (last 3 results) No results found for this basename: PROBNP,  in the last 8760 hours CBG:  Recent Labs Lab 08/26/13 0004 08/26/13 0730  GLUCAP 126* 86    No results found for this or any previous visit (from the past 240 hour(s)).   Studies: No results found.  Scheduled Meds: . divalproex  750 mg Oral TID  . enoxaparin (LOVENOX) injection  40 mg Subcutaneous Q24H  . famotidine  20 mg Oral Daily  . gabapentin  600 mg Oral TID  . hydroxychloroquine  200 mg Oral BID  . levETIRAcetam  750 mg Oral BID   Continuous Infusions: . sodium chloride 75 mL/hr at 08/26/13 1610    Principal Problem:   Seizures Active Problems:   Insomnia   Headache(784.0)   GERD (gastroesophageal reflux disease)    Time spent: > 35 minutes     Esperanza Sheets  [facility] Pager [phone] . If 7PM-7AM, please contact night-coverage at [email or web address], password [facility] 08/26/2013, 8:59 AM  LOS: 1 day

## 2013-08-26 NOTE — Consult Note (Signed)
Reason for Consult:Seizures Referring Physician: York Spaniel  CC: Seizures  HPI: Brenda Mcguire is an 52 y.o. female with a history of seizures and Bechet's who reports that she has been overworking herself recently but has been compliant with medications.  Reports that today she began to feel weird at work.  Her environment started to move slowly and this is the warning she usually gets before having a seizure.  The patient laid on the floor and was noted by co-workers to have 3 seizures at work.  EMS was called and the patient had two further seizures en route.  Patient as intubated.  Improved while at the hospital and requested extubation.  Patient has done well since that time and was transferred here for further evaluation. Patient reports that she has had seizures for about 5 years.  Her last was 4 months ago.  It was a single seizure.  Her last cluster of seizures was 2 years ago.  She required intubation at that time as well.  The patient reports that she had multiple complications from that hospitalization.  She always has a warning as described above and reports that her seizures have been described as GTC.  She has associated bladder incontinence but no tongue biting.    Past Medical History  Diagnosis Date  . GERD (gastroesophageal reflux disease)   . Hyperlipidemia   . History of blood transfusion   . Seizures   . Chronic headaches   . Phlebitis   . History of colonic polyps   . Anxiety   . Behcet's disease     Past Surgical History  Procedure Laterality Date  . Cholecystectomy    . Tonsillectomy  1970  . Appendectomy  1975  . Abdominal hysterectomy  1988  . Lumbar fusion  1999    L5-S1    Family History  Problem Relation Age of Onset  . Diabetes Mother   . Hyperlipidemia Mother   . Stroke Mother   . Hypertension Mother   . Colon cancer Mother   . Breast cancer Mother   . Early death Father   . Heart attack Father   . Heart disease Father   . Hyperlipidemia Father    . COPD Sister     Social History:  reports that she has never smoked. She has never used smokeless tobacco. She reports that she does not drink alcohol or use illicit drugs.  Allergies  Allergen Reactions  . Iohexol Anaphylaxis, Hives and Shortness Of Breath  . Codeine Itching  . Hepatitis B Vaccine   . 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 Other     CT Contrast    Medications:  I have reviewed the patient's current medications. Prior to Admission:  Prescriptions prior to admission  Medication Sig Dispense Refill  . ALPRAZolam (XANAX) 0.5 MG tablet Take 1 tablet (0.5 mg total) by mouth 3 (three) times daily as needed for sleep.  30 tablet  1  . divalproex (DEPAKOTE) 250 MG DR tablet Take 750 mg by mouth 3 (three) times daily.      Marland Kitchen gabapentin (NEURONTIN) 300 MG capsule Take 600 mg by mouth 3 (three) times daily.      Marland Kitchen glucagon 1 MG injection Inject 1 mg into the vein once as needed.  1 each  12  . HYDROcodone-acetaminophen (NORCO) 5-325 MG per tablet Take 1-2 tablets by mouth every 6 (six) hours as needed for pain.  20 tablet  0  . hydroxychloroquine (  PLAQUENIL) 200 MG tablet Take 1 tablet (200 mg total) by mouth 2 (two) times daily.  60 tablet  3  . levETIRAcetam (KEPPRA) 500 MG tablet Take 1 tablet (500 mg total) by mouth 2 (two) times daily.  60 tablet  3  . mirtazapine (REMERON SOL-TAB) 15 MG disintegrating tablet Take 1 tablet (15 mg total) by mouth at bedtime.  30 tablet  2  . ondansetron (ZOFRAN ODT) 4 MG disintegrating tablet 4mg  ODT q4 hours prn nausea/vomit  4 tablet  0  . promethazine (PHENERGAN) 12.5 MG tablet Take 1 tablet (12.5 mg total) by mouth every 6 (six) hours as needed for nausea.  30 tablet  0  . traMADol (ULTRAM) 50 MG tablet Take 1 tablet (50 mg total) by mouth every 8 (eight) hours as needed for pain.  90 tablet  2  . zolpidem (AMBIEN CR) 12.5 MG CR tablet Take 1 tablet (12.5 mg total) by mouth at bedtime as needed for sleep.  30 tablet  0    Scheduled: . divalproex  750 mg Oral TID  . enoxaparin (LOVENOX) injection  40 mg Subcutaneous Q24H  . famotidine  20 mg Oral Daily  . gabapentin  600 mg Oral TID  . hydroxychloroquine  200 mg Oral BID  . levETIRAcetam  500 mg Oral BID  . phenytoin (DILANTIN) IV  100 mg Intravenous Q8H    ROS: History obtained from the patient  General ROS: negative for - chills, fatigue, fever, night sweats, weight gain or weight loss Psychological ROS: negative for - behavioral disorder, hallucinations, memory difficulties, mood swings or suicidal ideation Ophthalmic ROS: negative for - blurry vision, double vision, eye pain or loss of vision ENT ROS: negative for - epistaxis, nasal discharge, oral lesions, sore throat, tinnitus or vertigo Allergy and Immunology ROS: negative for - hives or itchy/watery eyes Hematological and Lymphatic ROS: negative for - bleeding problems, bruising or swollen lymph nodes Endocrine ROS: negative for - galactorrhea, hair pattern changes, polydipsia/polyuria or temperature intolerance Respiratory ROS: negative for - cough, hemoptysis, shortness of breath or wheezing Cardiovascular ROS: negative for - chest pain, dyspnea on exertion, edema or irregular heartbeat Gastrointestinal ROS: nausea Genito-Urinary ROS: negative for - dysuria, hematuria, incontinence or urinary frequency/urgency Musculoskeletal ROS: knee pain Neurological ROS: as noted in HPI Dermatological ROS: mouth sores  Physical Examination: Blood pressure 118/59, pulse 103, temperature 99.4 F (37.4 C), temperature source Oral, resp. rate 16, height 5\' 10"  (1.778 m), weight 78.563 kg (173 lb 3.2 oz), SpO2 96.00%.  Neurologic Examination Mental Status: Alert, oriented, thought content appropriate.  Speech fluent without evidence of aphasia.  Able to follow 3 step commands without difficulty. Cranial Nerves: II: Discs flat bilaterally; Visual fields grossly normal, pupils equal, round, reactive to  light and accommodation III,IV, VI: ptosis not present, extra-ocular motions intact bilaterally V,VII: smile symmetric, facial light touch sensation normal bilaterally VIII: hearing normal bilaterally IX,X: gag reflex present XI: bilateral shoulder shrug XII: midline tongue extension Motor: Right : Upper extremity   5/5    Left:     Upper extremity   5/5  Lower extremity   5/5     Lower extremity   5/5 Tone and bulk:normal tone throughout; no atrophy noted Sensory: Pinprick and light touch intact throughout, bilaterally, excluding the last two toes on the left foot Deep Tendon Reflexes: 2+ and symmetric throughout Plantars: Right: upgoing   Left: upgoing Cerebellar: normal finger-to-nose and normal heel-to-shin test Gait: Unable to test CV: pulses palpable throughout  Laboratory Studies:   Basic Metabolic Panel:  Recent Labs Lab 08/25/13 2253  NA 140  K 4.0  CL 105  CO2 22  GLUCOSE 129*  BUN 11  CREATININE 0.64  CALCIUM 9.6    Liver Function Tests:  Recent Labs Lab 08/25/13 2253  AST 16  ALT 13  ALKPHOS 79  BILITOT 0.3  PROT 7.3  ALBUMIN 3.7   No results found for this basename: LIPASE, AMYLASE,  in the last 168 hours No results found for this basename: AMMONIA,  in the last 168 hours  CBC:  Recent Labs Lab 08/25/13 2253  WBC 9.1  HGB 11.5*  HCT 33.2*  MCV 88.5  PLT 279    Cardiac Enzymes:  Recent Labs Lab 08/25/13 2253 08/25/13 2255  CKTOTAL 222*  --   TROPONINI  --  <0.30    BNP: No components found with this basename: POCBNP,   CBG:  Recent Labs Lab 08/26/13 0004  GLUCAP 126*    Microbiology: Results for orders placed during the hospital encounter of 04/12/13  CSF CULTURE     Status: None   Collection Time    04/12/13  6:15 PM      Result Value Range Status   Specimen Description CSF   Final   Special Requests NO 2 2CC   Final   Gram Stain     Final   Value: CYTOSPIN WBC PRESENT, PREDOMINANTLY MONONUCLEAR     NO  ORGANISMS SEEN     Gram Stain Report Called to,Read Back By and Verified With: Gram Stain Report Called to,Read Back By and Verified With: RN N BULLOCK AT 2018 04/12/13 BY Stephannie Li Performed at [facility]   Culture NO GROWTH 3 DAYS   Final   Report Status 04/16/2013 FINAL   Final  GRAM STAIN     Status: None   Collection Time    04/12/13  6:15 PM      Result Value Range Status   Specimen Description CSF   Final   Special Requests NO 2 2CC   Final   Gram Stain     Final   Value: CYTOSPIN PREP     WBC PRESENT, PREDOMINANTLY MONONUCLEAR     NO ORGANISMS SEEN     Gram Stain Report Called to,Read Back By and Verified With: RN N. BULLOCK 04/12/13 2018 Stephannie Li.   Report Status 04/12/2013 FINAL   Final    Coagulation Studies:  Recent Labs  08/25/13 2253  LABPROT 13.2  INR 1.02    Urinalysis: No results found for this basename: COLORURINE, APPERANCEUR, LABSPEC, PHURINE, GLUCOSEU, HGBUR, BILIRUBINUR, KETONESUR, PROTEINUR, UROBILINOGEN, NITRITE, LEUKOCYTESUR,  in the last 168 hours  Lipid Panel:     Component Value Date/Time   CHOL 234* 03/02/2013 1115   TRIG 83.0 03/02/2013 1115   HDL 45.90 03/02/2013 1115   CHOLHDL 5 03/02/2013 1115   VLDL 16.6 03/02/2013 1115    HgbA1C:  No results found for this basename: HGBA1C    Urine Drug Screen:   No results found for this basename: labopia, cocainscrnur, labbenz, amphetmu, thcu, labbarb    Alcohol Level: No results found for this basename: ETH,  in the last 168 hours   Imaging: No results found.   Assessment/Plan: 52 year old female with a history of seizures presenting with multiple breakthrough seizures.  Seizures described as tonic-clonic.  Interestingly though, CK is only 222.  Patient on Keppra and Depakote.  Depakote level of 56.2.  Dilantin has been added  to regimen.  Patient describes an increase in stressors recently.  Reports that monitoring was attempted at [facility] but she did not have any seizures at that time.    Recommendations: 1.  EEG in AM 2.  Would not continue Dilantin 3.  Increase Keppra to 750mg  BID 4.  Continue Depakote at current dose 5.  Seizure precautions 6.  Patient was to have MRI of the brain with and without contrast performed as an outpatient but has not been able.  Will order as an inpatient.  She reports that she is claustrophobic.  If unable to complete with Ativan would schedule as an outpatient in an open MRI.    Thana Farr, MD [facility] [phone] 08/26/2013, 1:22 AM

## 2013-08-26 NOTE — Progress Notes (Signed)
Event: Notified by RN pt c/o not being able to "catch her breath". Appears to be hyperventilating. 02 sats are 98-100% on R/A. BBS CTA. Pt tearful. Rapid response RN has been called and is currently at bedside. Order for Ativan 0.5mg  IV given. NP to bedside. Subjective: Pt unable to give a great deal of information d/t rapid shallow respirations. Indicates she "can't breathe". Pt requesting ABG. c/o h/a.  Objective: Brenda Mcguire is a 52 y.o. female with a past medical history of seizure,  pseudoseizure, behcet's disease, anxiety and GERD admitted on 08/25/13 with seizures and LOC. She was intubated by EMS in route to ED but rapidly improved in ED and was extubated. At bedside pt noted w/ rapid shallow respirations c/w hyperventilation. BBS CTA, 02 sats are 100% on 2L [place]. Skin w/d. Remaining VSS. CXR from 1720 this evening w/o acute findings. Pt is at times tearful. RN and myself stepped outside the room. While at nursing station viewed pt on video screen as she is in a camera room. Pt noted w/o the rapid respirations observed just moments before. Pt noted getting paper out of her pocketbook on which she is observed writing something. But the rapid breathing returned when RN re-entered the room. She offered the note to the RN and agreed RN could keep. The note revealed pt writes that "nobody believes me, I never hyperventilate" and , "Please knock me out and put me back on the vent" and Usually my ICP is up and I need to have a spinal every 45 days to get rid of h/a's". Assessment/Plan: 1. Hyperventilation: Likely anxiety related (as pt has h/o anxiety) vs attention seeking behavior. Pt clearly moving air w/ normal 02 sats. No indication for ABG. Pt is not tachycardic. Some improvement w/ IV Ativan. 2. Headache: H/o chronic h/a's. Pt given Benadryl, Reglan, Phenergan and Toradol IV and reported some improvement in h/a.  Reassessment: 30 minutes after event pt noted by RN to be resting in NAD w/o rapid  respirations. VS remain stable. Will continue to monitor closely.  Leanne Chang, NP-C [facility] Pager [phone]

## 2013-08-26 NOTE — Progress Notes (Addendum)
Patient had trending down BP's threw the afternoon; denied dizziness or SOB; c/o fatigue; notified MD of CXR report; orders received to bolus with NS and start zosyn IV;  At 1900 called to room by patient; short of breath and wouldn't speak;"I can't get my breath"per patient; Vital signs BP improved; see doc flowsheet; O2 Sats 99-100% on 2l;breath sounds diminished;hyperventilating 28-30 breaths per minute; sweaty; pulse 100; rapid response RN called and respiratory therapy to assess; night MD returned page; report given of events and patient hx; prn's given; continue to monitor patient.

## 2013-08-26 NOTE — Significant Event (Signed)
Rapid Response Event Note Called by charge RN to see this pt for resp distress Overview: Time Called: 1916 Arrival Time: 1921 Event Type: Respiratory  Initial Focused Assessment: On arrival pt is sitting upright in bed & appears very anxious.  Pt is taking quick shallow breaths with clear BS.  Pt whisper speaks & states her head still hurts.  Pt seems very tearful.  Neb tx given per RT & lungs remain clear.  Pt still with shallow short resp pattern. Pt's sats remain 100% on 1 L.  Interventions: Albuterol per RT Xanax prn Morphine IV Event Summary: Brenda Delay, NP   at   1945        Brenda Mcguire

## 2013-08-27 ENCOUNTER — Inpatient Hospital Stay (HOSPITAL_COMMUNITY): Payer: Federal, State, Local not specified - PPO

## 2013-08-27 LAB — COMPREHENSIVE METABOLIC PANEL
ALT: 10 U/L (ref 0–35)
AST: 13 U/L (ref 0–37)
Albumin: 3.4 g/dL — ABNORMAL LOW (ref 3.5–5.2)
Alkaline Phosphatase: 78 U/L (ref 39–117)
Calcium: 8.6 mg/dL (ref 8.4–10.5)
GFR calc Af Amer: 87 mL/min — ABNORMAL LOW (ref >90)
Potassium: 4.1 mEq/L (ref 3.5–5.1)
Sodium: 143 mEq/L (ref 135–145)
Total Protein: 6.4 g/dL (ref 6.0–8.3)

## 2013-08-27 LAB — CBC
MCH: 30.4 pg (ref 26.0–34.0)
MCHC: 33.4 g/dL (ref 30.0–36.0)
Platelets: 225 10*3/uL (ref 150–400)
RDW: 13 % (ref 11.5–15.5)

## 2013-08-27 LAB — T4, FREE: Free T4: 1.09 ng/dL (ref 0.80–1.80)

## 2013-08-27 MED ORDER — LEVALBUTEROL HCL 0.63 MG/3ML IN NEBU
0.6300 mg | INHALATION_SOLUTION | Freq: Three times a day (TID) | RESPIRATORY_TRACT | Status: DC | PRN
  Administered 2013-08-27: 0.63 mg via RESPIRATORY_TRACT
  Filled 2013-08-27 (×2): qty 3

## 2013-08-27 MED ORDER — MORPHINE SULFATE 2 MG/ML IJ SOLN
2.0000 mg | Freq: Once | INTRAMUSCULAR | Status: AC
  Administered 2013-08-27: 2 mg via INTRAVENOUS
  Filled 2013-08-27: qty 1

## 2013-08-27 MED ORDER — LORAZEPAM 2 MG/ML IJ SOLN
0.5000 mg | Freq: Once | INTRAMUSCULAR | Status: AC
  Administered 2013-08-27: 0.5 mg via INTRAVENOUS
  Filled 2013-08-27: qty 1

## 2013-08-27 MED ORDER — DIVALPROEX SODIUM 500 MG PO DR TAB
750.0000 mg | DELAYED_RELEASE_TABLET | Freq: Two times a day (BID) | ORAL | Status: DC
  Administered 2013-08-28 – 2013-08-29 (×3): 750 mg via ORAL
  Filled 2013-08-27 (×4): qty 1

## 2013-08-27 NOTE — Progress Notes (Addendum)
[facility] PROGRESS NOTE  Brenda Mcguire HQI:[id number] DOB: 1961/01/21 DOA: 08/25/2013 PCP: Nicki Reaper, NP  Assessment/Plan: 52 y.o. female with Past medical history of seizure and pseudoseizure, behcet's disease, anxiety, GERD admitted with seizures   1. Seizures of unclear etiology (5 times, intubated/extubated same day); no seizure since admission 9/13 -? Tramadol induced; patient takes prn last dose on 9/10; d/w recommended to stop tramadol  -pend MRI, EEG; cont keppra, depakote per neurology; pend MRI need sedation   -persistent headaches; ? Need LP; headaches+nausea ? Increased ICP; h/o multiples LP in the past; defer LP to neurologist; appreciate co management   2. GERD, stable cont PPI -nausea cont zofran   3. S/p extubation for airway protection with seizures  -mild decreased AE in LLL lung; CXR: atelectasis vs infiltrate ? Aspiration with seizure; started atx for possible pneumonia; cont monitoring  Addendum:  Patient reported SOB overnight ? etiology, POX normal; VS stable; -will obtain stat ECG, CXR, VQ r./o PE; labs; close monitoring   4. H/o low tsh ; recheck tsh-0.2; will obtain free T4    D/w her husband over the phone Merk,James Spouse [phone]  Code Status: full  Family Communication: family not present at the bedside  (indicate person spoken with, relationship, and if by phone, the number) Disposition Plan: home    Consultants:  Neurology   Procedures:    Antibiotics:  No  (indicate start date, and stop date if known)  HPI/Subjective: Alert, reports headache   Objective: Filed Vitals:   08/27/13 0605  BP: 90/61  Pulse: 78  Temp: 97.9 F (36.6 C)  Resp: 18    Intake/Output Summary (Last 24 hours) at 08/27/13 0837 Last data filed at 08/27/13 0700  Gross per 24 hour  Intake      0 ml  Output      2 ml  Net     -2 ml   Filed Weights   08/25/13 1900  Weight: 78.563 kg (173 lb 3.2 oz)    Exam:   General:   Alert, oriented   Cardiovascular: S1, S2 RRR  Respiratory: decreased AE in L lung   Abdomen: soft, NT, ND   Musculoskeletal: noLE edema    Data Reviewed: Basic Metabolic Panel:  Recent Labs Lab 08/25/13 2253 08/26/13 0500  NA 140 140  K 4.0 3.8  CL 105 106  CO2 22 24  GLUCOSE 129* 83  BUN 11 13  CREATININE 0.64 0.72  CALCIUM 9.6 8.8   Liver Function Tests:  Recent Labs Lab 08/25/13 2253 08/26/13 0500  AST 16 15  ALT 13 11  ALKPHOS 79 68  BILITOT 0.3 0.4  PROT 7.3 6.5  ALBUMIN 3.7 3.5   No results found for this basename: LIPASE, AMYLASE,  in the last 168 hours No results found for this basename: AMMONIA,  in the last 168 hours CBC:  Recent Labs Lab 08/25/13 2253 08/26/13 0500  WBC 9.1 9.1  NEUTROABS  --  5.3  HGB 11.5* 10.4*  HCT 33.2* 30.6*  MCV 88.5 90.0  PLT 279 231   Cardiac Enzymes:  Recent Labs Lab 08/25/13 2253 08/25/13 2255 08/26/13 0500  CKTOTAL 222*  --  212*  TROPONINI  --  <0.30  --    BNP (last 3 results) No results found for this basename: PROBNP,  in the last 8760 hours CBG:  Recent Labs Lab 08/26/13 0004 08/26/13 0730  GLUCAP 126* 86    No results found for this or any previous visit (  from the past 240 hour(s)).   Studies: Dg Chest Port 1 View  08/26/2013   CLINICAL DATA:  Diminished breath sounds.  EXAM: PORTABLE CHEST - 1 VIEW  COMPARISON:  None.  FINDINGS: Left Port-A-Cath is in place with the tip at the cavoatrial junction. Low lung volumes with bibasilar atelectasis. Heart size is accentuated by the portable nature of the study and low volumes, likely within normal limits. No visible effusions or acute bony abnormality.  IMPRESSION: Low lung volumes, bibasilar atelectasis.   Electronically Signed   By: Charlett Nose M.D.   On: 08/26/2013 17:25    Scheduled Meds: . divalproex  750 mg Oral TID  . enoxaparin (LOVENOX) injection  40 mg Subcutaneous Q24H  . famotidine  20 mg Oral Daily  . gabapentin  600 mg Oral TID   . hydroxychloroquine  200 mg Oral BID  . levETIRAcetam  750 mg Oral BID  . piperacillin-tazobactam (ZOSYN)  IV  3.375 g Intravenous Q8H   Continuous Infusions: . sodium chloride 125 mL/hr at 08/26/13 2355    Principal Problem:   Seizures Active Problems:   Insomnia   Headache(784.0)   GERD (gastroesophageal reflux disease)    Time spent: > 35 minutes     Esperanza Sheets  [facility] Pager [phone] . If 7PM-7AM, please contact night-coverage at [email or web address], password [facility] 08/27/2013, 8:37 AM  LOS: 2 days

## 2013-08-27 NOTE — Progress Notes (Signed)
NP notified of patient's improved respiratory status, HA and decrease in anxiety after migraine cocktail. Also notified of patient's hypotension (BP 86/58) . No new orders received. Will continue to monitor.

## 2013-08-27 NOTE — Progress Notes (Signed)
NEURO HOSPITALIST PROGRESS NOTE   SUBJECTIVE:                                                                                                                        Called because patient has been unresponsive for at least 10 minutes. Apparently this unresponsiveness developed after she took her afternoon dose of xanax and bronchodilator. No hyperkinetic movements reported. MRI brain not done as she became very claustrophobic.  OBJECTIVE:                                                                                                                           Vital signs in last 24 hours: Temp:  [97.9 F (36.6 C)-98.7 F (37.1 C)] 98.6 F (37 C) (09/14 1705) Pulse Rate:  [77-117] 107 (09/14 1801) Resp:  [16-20] 18 (09/14 1705) BP: (86-118)/(50-69) 106/58 mmHg (09/14 1801) SpO2:  [94 %-100 %] 100 % (09/14 1801)  Intake/Output from previous day: 09/13 0701 - 09/14 0700 In: -  Out: 2 [Urine:2] Intake/Output this shift: Total I/O In: -  Out: 1 [Urine:1] Nutritional status: General  Past Medical History  Diagnosis Date  . GERD (gastroesophageal reflux disease)   . Hyperlipidemia   . History of blood transfusion   . Seizures   . Chronic headaches   . Phlebitis   . History of colonic polyps   . Anxiety   . Behcet's disease     Neurologic Exam:  Mental status: unresponsive. CN 2-12: pupils 4 mm bilaterally, reactive to light. No gaze preference. EOM full without nystagmus. No face weakness. Motor: moves all limbs spontaneously. Sensory: doesn't react to pain Coordination and gait: untested. Plantars: downgoing. No meningeal signs.  Lab Results: Lab Results  Component Value Date/Time   CHOL 234* 03/02/2013 11:15 AM   Lipid Panel No results found for this basename: CHOL, TRIG, HDL, CHOLHDL, VLDL, LDLCALC,  in the last 72 hours  Studies/Results: Dg Chest Port 1 View  08/27/2013   *RADIOLOGY REPORT*  Clinical Data: Short of breath   PORTABLE CHEST - 1 VIEW  Comparison: Prior chest x-ray 08/26/2013  Findings: Left subclavian approach single lumen portacatheter.  The tip of the catheter projects over the superior cavoatrial junction. Stable cardiac and mediastinal contours.  Inspiratory volumes are low and there is mild bibasilar opacities unchanged in appearance compared to yesterday.  No new pleural effusion or pneumothorax. No acute osseous abnormality.  IMPRESSION: Stable appearance of low inspiratory volumes and bibasilar atelectasis.   Original Report Authenticated By: Malachy Moan, M.D.   Dg Chest Port 1 View  08/26/2013   CLINICAL DATA:  Diminished breath sounds.  EXAM: PORTABLE CHEST - 1 VIEW  COMPARISON:  None.  FINDINGS: Left Port-A-Cath is in place with the tip at the cavoatrial junction. Low lung volumes with bibasilar atelectasis. Heart size is accentuated by the portable nature of the study and low volumes, likely within normal limits. No visible effusions or acute bony abnormality.  IMPRESSION: Low lung volumes, bibasilar atelectasis.   Electronically Signed   By: Charlett Nose M.D.   On: 08/26/2013 17:25    MEDICATIONS                                                                                                                       I have reviewed the patient's current medications.  ASSESSMENT/PLAN:                                                                                                           Prolonged unresponsiveness with a non focal neuro-exam. Doubt seizures, stroke, or structural brain disease. Monitor clinicaly. MRI brain and EEG. Will follow up.  Wyatt Portela, MD [facility] [phone]  08/27/2013, 6:41 PM

## 2013-08-27 NOTE — Progress Notes (Signed)
[facility] PROGRESS NOTE  Brenda Mcguire WGN:[id number] DOB: 1961-08-07 DOA: 08/25/2013 PCP: Nicki Reaper, NP  Assessment/Plan: Called to evaluated the Patient for unresponsiveness;     She became unresponsive after taking her afternoon medications xanax, and bronchodilator; no witnessed seizure;  -VS 106/58; HR 107; POX 100%; she is not responding to noxious stimuli  -I saw her opening her eye, but she does not respond to commands   Exam:   General:  Unresponsive   Cardiovascular: s1,s2 rrr; tachycardia   Respiratory: CTA BL   Abdomen: soft; +BS  Musculoskeletal: no edema;    -She had similar episode on admission with unresponsiveness; thought ? Seizure related; patient got intubated then woke up and requested to be extubated  -08/26/13: patient had some SOB; evaluated by night team exam was unremarkable; but she requested to be intubated without any obvious  Indications; but later symptoms resolved;   DDx: ? Post ictal; vs pseudoseizure (h/o pseudosezires) -may need video EEG -can't give flumazenil; high risk of seizure;  -d/w Dr. Marisa Severin; decreased the dose of keppra to 750 BID, hold gabapentin, decrease depakote to BID; hopd xanax;  will arrange video EEg; MRI with sedation;    -close monitor    Esperanza Sheets  [facility] Pager [phone]. If 7PM-7AM, please contact night-coverage at [email or web address], password [facility] 08/27/2013, 6:10 PM  LOS: 2 days

## 2013-08-27 NOTE — Progress Notes (Addendum)
Called to room by patient; states, "I can't breath".  Hyperventilating 28-30 breaths per minute; VSS stable; pulse Ox is 99% on 2l .  Breath sounds are diminished; no wheezes heard. Prn breathing tx administered; will notify MD.

## 2013-08-27 NOTE — Progress Notes (Signed)
Patient requesting anxiety med; pain med. And HHN; feels SOB coming on per patient; medicated prn; after HHN patient became sleepy very quickly and wouldn't respond to tactal stimulas; VSS ; no acute distress or respiratory distress; no witnessed seizure activity.  Dr. York Spaniel came to assess the patient followed by Dr. Leroy Kennedy for neurology; continue to observe patient; patient remains sleepy.

## 2013-08-28 ENCOUNTER — Encounter (HOSPITAL_COMMUNITY): Payer: Self-pay | Admitting: Radiology

## 2013-08-28 ENCOUNTER — Inpatient Hospital Stay (HOSPITAL_COMMUNITY): Payer: Federal, State, Local not specified - PPO

## 2013-08-28 MED ORDER — FENTANYL CITRATE 0.05 MG/ML IJ SOLN: 25.0000 ug | INTRAMUSCULAR | Status: DC | PRN

## 2013-08-28 MED ORDER — KETOROLAC TROMETHAMINE 15 MG/ML IJ SOLN
15.0000 mg | Freq: Four times a day (QID) | INTRAMUSCULAR | Status: DC | PRN
  Administered 2013-08-28 – 2013-08-29 (×2): 15 mg via INTRAVENOUS
  Filled 2013-08-28 (×4): qty 1

## 2013-08-28 MED ORDER — MIDAZOLAM HCL 2 MG/2ML IJ SOLN
1.0000 mg | INTRAMUSCULAR | Status: DC | PRN
  Administered 2013-08-28 (×5): 2 mg via INTRAVENOUS
  Filled 2013-08-28: qty 10

## 2013-08-28 MED ORDER — HYDROCODONE-ACETAMINOPHEN 5-325 MG PO TABS
1.0000 | ORAL_TABLET | Freq: Once | ORAL | Status: AC
  Administered 2013-08-28: 1 via ORAL
  Filled 2013-08-28: qty 1

## 2013-08-28 MED ORDER — FENTANYL CITRATE 0.05 MG/ML IJ SOLN
25.0000 ug | INTRAMUSCULAR | Status: DC | PRN
  Administered 2013-08-28 (×4): 50 ug via INTRAVENOUS

## 2013-08-28 MED ORDER — LORAZEPAM 2 MG/ML IJ SOLN
1.0000 mg | Freq: Once | INTRAMUSCULAR | Status: AC
  Administered 2013-08-28: 1 mg via INTRAVENOUS
  Filled 2013-08-28: qty 1

## 2013-08-28 MED ORDER — MIDAZOLAM HCL 2 MG/2ML IJ SOLN
1.0000 mg | INTRAMUSCULAR | Status: DC | PRN
  Filled 2013-08-28: qty 10

## 2013-08-28 MED ORDER — LORAZEPAM 1 MG PO TABS
1.0000 mg | ORAL_TABLET | Freq: Once | ORAL | Status: DC
  Filled 2013-08-28: qty 1

## 2013-08-28 MED ORDER — ONDANSETRON 8 MG/NS 50 ML IVPB
8.0000 mg | INTRAVENOUS | Status: DC | PRN
  Administered 2013-08-28 – 2013-08-29 (×2): 8 mg via INTRAVENOUS
  Filled 2013-08-28 (×2): qty 8

## 2013-08-28 MED ORDER — ONDANSETRON HCL 4 MG/2ML IJ SOLN
INTRAMUSCULAR | Status: AC
  Administered 2013-08-28: 4 mg via INTRAVENOUS
  Filled 2013-08-28: qty 2

## 2013-08-28 NOTE — Progress Notes (Signed)
EEG Completed; Results Pending  

## 2013-08-28 NOTE — ED Notes (Signed)
Pt unable to complete exam.  Dr. Karin Golden notified

## 2013-08-28 NOTE — Progress Notes (Addendum)
[facility] PROGRESS NOTE  Brenda Mcguire WUJ:[id number] DOB: May 29, 1961 DOA: 08/25/2013 PCP: Nicki Reaper, NP  Assessment/Plan: 52 y.o. female with Past medical history of seizure and pseudoseizure, behcet's disease, anxiety, GERD admitted with seizures   1. Seizures of unclear etiology (5 times, intubated/extubated same day); ? Pseudoseizure; no seizure since admission 9/13 -? Tramadol induced; patient takes prn last dose on 9/10; d/w recommended to stop tramadol  -pend MRI with sedation, EEG; cont keppra, depakote per neurology;     Events: 08/27/13 Called to evaluated the Patient for unresponsiveness;  She became unresponsive after taking her afternoon medications xanax, and bronchodilator; no witnessed seizure;  -VS 106/58; HR 107; POX 100%; she is not responding to noxious stimuli  -I saw her opening her eye, but she does not respond to commands  -later symptoms resolved without intervention; d/w neurology will arrange for video EEG; decreased Depakote, gabapentin dose;   Events:  -08/27/13: She had similar episode on admission with unresponsiveness; thought ? Seizure related; patient got intubated then woke up and requested to be extubated   -08/26/13: patient had some SOB; evaluated by night team exam was unremarkable; but she requested to be intubated without any obvious Indications; but later symptoms resolved;   -9/14//14 Another episode of shallow breathing with normal vital signs; oxygenation normal; ? Etiology; will monitor;  -if further work up negative may need psychiatry evaluation   2. GERD, stable cont PPI -nausea cont zofran   3. S/p extubation for airway protection with seizures  -mild decreased AE in LLL lung; CXR: atelectasis vs infiltrate; empirically started atx for possible pneumonia;  -during 48 hours clinically afebrile;e non leukocytosis, low suspicious for pneumonia; will d/ atx and monitor    4. H/o low tsh ; recheck tsh-0.2; free T4 WNL  5.  episodes of low BP asymptomatic; likely has low BP; monitor    6. Patient has porta cath (use for chemo for behcet's); ? Reason at this time; d/w her may need to consider to remove; since never used for chemo 4-5 years; she declined wants to keep it.     D/w her husband over the phone Brenda Mcguire, Brenda Mcguire [phone] on 9/13 who said will come to see her, but never came   Code Status: full  Family Communication: family not present at the bedside  (indicate person spoken with, relationship, and if by phone, the number) Disposition Plan: home    Consultants:  Neurology   Procedures:    Antibiotics:  No  (indicate start date, and stop date if known)  HPI/Subjective: Alert, reports headache   Objective: Filed Vitals:   08/28/13 0811  BP: 115/74  Pulse: 80  Temp:   Resp: 16    Intake/Output Summary (Last 24 hours) at 08/28/13 0835 Last data filed at 08/28/13 0500  Gross per 24 hour  Intake      0 ml  Output      2 ml  Net     -2 ml   Filed Weights   08/25/13 1900  Weight: 78.563 kg (173 lb 3.2 oz)    Exam:   General:  Alert, oriented   Cardiovascular: S1, S2 RRR  Respiratory: decreased AE in L lung   Abdomen: soft, NT, ND   Musculoskeletal: noLE edema    Data Reviewed: Basic Metabolic Panel:  Recent Labs Lab 08/25/13 2253 08/26/13 0500 08/27/13 1020  NA 140 140 143  K 4.0 3.8 4.1  CL 105 106 108  CO2 22 24 26   GLUCOSE  129* 83 84  BUN 11 13 14   CREATININE 0.64 0.72 0.88  CALCIUM 9.6 8.8 8.6   Liver Function Tests:  Recent Labs Lab 08/25/13 2253 08/26/13 0500 08/27/13 1020  AST 16 15 13   ALT 13 11 10   ALKPHOS 79 68 78  BILITOT 0.3 0.4 0.2*  PROT 7.3 6.5 6.4  ALBUMIN 3.7 3.5 3.4*   No results found for this basename: LIPASE, AMYLASE,  in the last 168 hours No results found for this basename: AMMONIA,  in the last 168 hours CBC:  Recent Labs Lab 08/25/13 2253 08/26/13 0500 08/27/13 1020  WBC 9.1 9.1 8.3   NEUTROABS  --  5.3  --   HGB 11.5* 10.4* 10.9*  HCT 33.2* 30.6* 32.6*  MCV 88.5 90.0 90.8  PLT 279 231 225   Cardiac Enzymes:  Recent Labs Lab 08/25/13 2253 08/25/13 2255 08/26/13 0500  CKTOTAL 222*  --  212*  TROPONINI  --  <0.30  --    BNP (last 3 results) No results found for this basename: PROBNP,  in the last 8760 hours CBG:  Recent Labs Lab 08/26/13 0004 08/26/13 0730  GLUCAP 126* 86    No results found for this or any previous visit (from the past 240 hour(s)).   Studies: Dg Chest Port 1 View  08/27/2013   *RADIOLOGY REPORT*  Clinical Data: Short of breath  PORTABLE CHEST - 1 VIEW  Comparison: Prior chest x-ray 08/26/2013  Findings: Left subclavian approach single lumen portacatheter.  The tip of the catheter projects over the superior cavoatrial junction. Stable cardiac and mediastinal contours.  Inspiratory volumes are low and there is mild bibasilar opacities unchanged in appearance compared to yesterday.  No new pleural effusion or pneumothorax. No acute osseous abnormality.  IMPRESSION: Stable appearance of low inspiratory volumes and bibasilar atelectasis.   Original Report Authenticated By: Malachy Moan, M.D.   Dg Chest Port 1 View  08/26/2013   CLINICAL DATA:  Diminished breath sounds.  EXAM: PORTABLE CHEST - 1 VIEW  COMPARISON:  None.  FINDINGS: Left Port-A-Cath is in place with the tip at the cavoatrial junction. Low lung volumes with bibasilar atelectasis. Heart size is accentuated by the portable nature of the study and low volumes, likely within normal limits. No visible effusions or acute bony abnormality.  IMPRESSION: Low lung volumes, bibasilar atelectasis.   Electronically Signed   By: Charlett Nose M.D.   On: 08/26/2013 17:25    Scheduled Meds: . divalproex  750 mg Oral Q12H  . enoxaparin (LOVENOX) injection  40 mg Subcutaneous Q24H  . famotidine  20 mg Oral Daily  . hydroxychloroquine  200 mg Oral BID  . levETIRAcetam  750 mg Oral BID  .  piperacillin-tazobactam (ZOSYN)  IV  3.375 g Intravenous Q8H   Continuous Infusions: . sodium chloride 75 mL/hr at 08/27/13 0841    Principal Problem:   Seizures Active Problems:   Insomnia   Headache(784.0)   GERD (gastroesophageal reflux disease)    Time spent: > 35 minutes     Esperanza Sheets  [facility] Pager [phone] . If 7PM-7AM, please contact night-coverage at [email or web address], password [facility] 08/28/2013, 8:35 AM  LOS: 3 days

## 2013-08-28 NOTE — H&P (Signed)
Chief Complaint: "I need an MRI and I have claustrophobia."  Referring Physician: Dr. Leroy Kennedy HPI: Carson Meche is an 52 y.o. female who has  PMHx of seizure and pseudoseizure, behcet's, anxiety and GERD. She presented after co-workers have noted her to be having seizures at work. EMS called, patient was intubated, seizures resolved and patient was extubated. Patient had an unresponsive episode for at least 10 minutes. This unresponsiveness developed after she took her afternoon dose of xanax and bronchodilator. Request for MRI with conscious sedation per Neurology. She states she has had a colonoscopy in the past with versed and fentanyl with no difficulties. She denies any allergy to MRI contrast.   Past Medical History:  Past Medical History  Diagnosis Date  . GERD (gastroesophageal reflux disease)   . Hyperlipidemia   . History of blood transfusion   . Seizures   . Chronic headaches   . Phlebitis   . History of colonic polyps   . Anxiety   . Behcet's disease     Past Surgical History:  Past Surgical History  Procedure Laterality Date  . Cholecystectomy    . Tonsillectomy  1970  . Appendectomy  1975  . Abdominal hysterectomy  1988  . Lumbar fusion  1999    L5-S1    Family History:  Family History  Problem Relation Age of Onset  . Diabetes Mother   . Hyperlipidemia Mother   . Stroke Mother   . Hypertension Mother   . Colon cancer Mother   . Breast cancer Mother   . Early death Father   . Heart attack Father   . Heart disease Father   . Hyperlipidemia Father   . COPD Sister     Social History:  reports that she has never smoked. She has never used smokeless tobacco. She reports that she does not drink alcohol or use illicit drugs.  Allergies:  Allergies  Allergen Reactions  . Iohexol Anaphylaxis, Hives and Shortness Of Breath  . Codeine Itching  . Hepatitis B Vaccine   . 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 Other     CT Contrast      Medication  List    ASK your doctor about these medications       ALPRAZolam 0.5 MG tablet  Commonly known as:  XANAX  Take 1 tablet (0.5 mg total) by mouth 3 (three) times daily as needed for sleep.     divalproex 250 MG DR tablet  Commonly known as:  DEPAKOTE  Take 750 mg by mouth 3 (three) times daily.     gabapentin 300 MG capsule  Commonly known as:  NEURONTIN  Take 600 mg by mouth 3 (three) times daily.     glucagon 1 MG injection  Inject 1 mg into the vein once as needed.     HYDROcodone-acetaminophen 5-325 MG per tablet  Commonly known as:  NORCO  Take 1-2 tablets by mouth every 6 (six) hours as needed for pain.     hydroxychloroquine 200 MG tablet  Commonly known as:  PLAQUENIL  Take 1 tablet (200 mg total) by mouth 2 (two) times daily.     levETIRAcetam 500 MG tablet  Commonly known as:  KEPPRA  Take 1 tablet (500 mg total) by mouth 2 (two) times daily.     mirtazapine 15 MG disintegrating tablet  Commonly known as:  REMERON SOL-TAB  Take 1 tablet (15 mg total) by mouth at bedtime.  ondansetron 4 MG disintegrating tablet  Commonly known as:  ZOFRAN ODT  4mg  ODT q4 hours prn nausea/vomit     promethazine 12.5 MG tablet  Commonly known as:  PHENERGAN  Take 1 tablet (12.5 mg total) by mouth every 6 (six) hours as needed for nausea.     traMADol 50 MG tablet  Commonly known as:  ULTRAM  Take 1 tablet (50 mg total) by mouth every 8 (eight) hours as needed for pain.     zolpidem 12.5 MG CR tablet  Commonly known as:  AMBIEN CR  Take 1 tablet (12.5 mg total) by mouth at bedtime as needed for sleep.        Please HPI for pertinent positives, otherwise complete 10 system ROS negative.  Physical Exam: BP 115/74  Pulse 80  Temp(Src) 98.1 F (36.7 C) (Oral)  Resp 16  Ht 5\' 10"  (1.778 m)  Wt 173 lb 3.2 oz (78.563 kg)  BMI 24.85 kg/m2  SpO2 100% Body mass index is 24.85 kg/(m^2).   General Appearance:  Alert, cooperative, no distress, appears stated age  Head:   Normocephalic, without obvious abnormality, atraumatic  Lungs:   Clear to auscultation bilaterally, no w/r/r, respirations unlabored without use of accessory muscles.  Chest Wall:  No tenderness or deformity  Heart:  Regular rate and rhythm, S1, S2 normal, no murmur, rub or gallop.  Abdomen:   Soft, non-tender, non distended.  Extremities: Extremities normal, atraumatic, no cyanosis or edema  Pulses: 1+ and symmetric  Neurologic: Normal affect, no gross deficits.   Results for orders placed during the hospital encounter of 08/25/13 (from the past 48 hour(s))  TSH     Status: Abnormal   Collection Time    08/26/13  9:30 AM      Result Value Range   TSH 0.204 (*) 0.350 - 4.500 uIU/mL   Comment: Performed at [facility]  T4, FREE     Status: None   Collection Time    08/27/13 10:20 AM      Result Value Range   Free T4 1.09  0.80 - 1.80 ng/dL   Comment: Performed at [facility]  CBC     Status: Abnormal   Collection Time    08/27/13 10:20 AM      Result Value Range   WBC 8.3  4.0 - 10.5 K/uL   RBC 3.59 (*) 3.87 - 5.11 MIL/uL   Hemoglobin 10.9 (*) 12.0 - 15.0 g/dL   HCT 95.6 (*) 21.3 - 08.6 %   MCV 90.8  78.0 - 100.0 fL   MCH 30.4  26.0 - 34.0 pg   MCHC 33.4  30.0 - 36.0 g/dL   RDW 57.8  46.9 - 62.9 %   Platelets 225  150 - 400 K/uL  COMPREHENSIVE METABOLIC PANEL     Status: Abnormal   Collection Time    08/27/13 10:20 AM      Result Value Range   Sodium 143  135 - 145 mEq/L   Potassium 4.1  3.5 - 5.1 mEq/L   Chloride 108  96 - 112 mEq/L   CO2 26  19 - 32 mEq/L   Glucose, Bld 84  70 - 99 mg/dL   BUN 14  6 - 23 mg/dL   Creatinine, Ser 5.28  0.50 - 1.10 mg/dL   Calcium 8.6  8.4 - 41.3 mg/dL   Total Protein 6.4  6.0 - 8.3 g/dL   Albumin 3.4 (*) 3.5 - 5.2 g/dL  AST 13  0 - 37 U/L   ALT 10  0 - 35 U/L   Alkaline Phosphatase 78  39 - 117 U/L   Total Bilirubin 0.2 (*) 0.3 - 1.2 mg/dL   GFR calc non Af Amer 75 (*) >90 mL/min   GFR calc Af Amer 87 (*) >90  mL/min   Comment: (NOTE)     The eGFR has been calculated using the CKD EPI equation.     This calculation has not been validated in all clinical situations.     eGFR's persistently <90 mL/min signify possible Chronic Kidney     Disease.  D-DIMER, QUANTITATIVE     Status: None   Collection Time    08/27/13 10:30 AM      Result Value Range   D-Dimer, Quant 0.42  0.00 - 0.48 ug/mL-FEU   Comment:            AT THE INHOUSE ESTABLISHED CUTOFF     VALUE OF 0.48 ug/mL FEU,     THIS ASSAY HAS BEEN DOCUMENTED     IN THE LITERATURE TO HAVE     A SENSITIVITY AND NEGATIVE     PREDICTIVE VALUE OF AT LEAST     98 TO 99%.  THE TEST RESULT     SHOULD BE CORRELATED WITH     AN ASSESSMENT OF THE CLINICAL     PROBABILITY OF DVT / VTE.   Dg Chest Port 1 View  08/27/2013   *RADIOLOGY REPORT*  Clinical Data: Short of breath  PORTABLE CHEST - 1 VIEW  Comparison: Prior chest x-ray 08/26/2013  Findings: Left subclavian approach single lumen portacatheter.  The tip of the catheter projects over the superior cavoatrial junction. Stable cardiac and mediastinal contours.  Inspiratory volumes are low and there is mild bibasilar opacities unchanged in appearance compared to yesterday.  No new pleural effusion or pneumothorax. No acute osseous abnormality.  IMPRESSION: Stable appearance of low inspiratory volumes and bibasilar atelectasis.   Original Report Authenticated By: Malachy Moan, M.D.   Dg Chest Port 1 View  08/26/2013   CLINICAL DATA:  Diminished breath sounds.  EXAM: PORTABLE CHEST - 1 VIEW  COMPARISON:  None.  FINDINGS: Left Port-A-Cath is in place with the tip at the cavoatrial junction. Low lung volumes with bibasilar atelectasis. Heart size is accentuated by the portable nature of the study and low volumes, likely within normal limits. No visible effusions or acute bony abnormality.  IMPRESSION: Low lung volumes, bibasilar atelectasis.   Electronically Signed   By: Charlett Nose M.D.   On: 08/26/2013  17:25    Assessment/Plan History of seizure and pseudoseizure. History of behcet's Unresponsive episode after xanax and bronchodilator.  Neurology Dr. Leroy Kennedy requested MRI with conscious sedation.  I spoke with Dr.Camilo regarding sedation with history of unresponsive episode as previous stated, he would like to proceed with MRI with conscious sedation.  Risks and Benefits discussed with the patient. All of the patient's questions were answered, patient is agreeable to proceed. Consent signed and in chart.   Pattricia Boss D PA-C 08/28/2013, 8:47 AM

## 2013-08-28 NOTE — Progress Notes (Signed)
Pt called and saying she's short of breath. Vital signs stable. MD on-call Bretta Bang, NP) aware and orders made. Pt not in respiratory distress. Pt O2 sat 100% on 2lpm. Will continue to monitor pt.

## 2013-08-28 NOTE — Procedures (Signed)
EEG report.  Brief clinical history: 52 years old female with a history of seizures and with episodes of prolonged unresponsiveness during this admission.  Technique: this is a 17 channel routine scalp EEG performed at the bedside with bipolar and monopolar montages arranged in accordance to the international 10/20 system of electrode placement. One channel was dedicated to EKG recording.  The study was performed during wakefulness, drowsiness, and stage 2 sleep. Intermittent photic stimulation was utilized as activating procedure.  Description:In the wakeful state, the best background consisted of a medium amplitude, posterior dominant, well sustained, symmetric and reactive 10 Hz rhythm. Drowsiness demonstrated dropout of the alpha rhythm. Stage 2 sleep showed symmetric and synchronous sleep spindles without intermixed epileptiform discharges. Intermittent photic stimulation did induce a normal driving response.  No focal or generalized epileptiform discharges noted.  No slowing seen.  EKG showed sinus rhythm.  Impression: this is a normal awake and asleep EEG. Please, be aware that a normal EEG does not exclude the possibility of epilepsy.  Clinical correlation is advised.  Wyatt Portela, MD

## 2013-08-29 ENCOUNTER — Encounter: Payer: Self-pay | Admitting: Internal Medicine

## 2013-08-29 DIAGNOSIS — R404 Transient alteration of awareness: Secondary | ICD-10-CM

## 2013-08-29 MED ORDER — AMOXICILLIN-POT CLAVULANATE 875-125 MG PO TABS: 1.0000 | ORAL_TABLET | Freq: Two times a day (BID) | ORAL | Status: DC

## 2013-08-29 MED ORDER — HEPARIN SOD (PORK) LOCK FLUSH 100 UNIT/ML IV SOLN
500.0000 [IU] | INTRAVENOUS | Status: AC | PRN
  Administered 2013-08-29: 500 [IU]

## 2013-08-29 NOTE — Progress Notes (Signed)
Pt discharge gone to self care accompanied by husband .discharge instruction and prescription given. Condition at discharge is stable. Left chest port de assessed by IV team.

## 2013-08-29 NOTE — Progress Notes (Signed)
NEURO HOSPITALIST PROGRESS NOTE   SUBJECTIVE:                                                                                                                        Stated that she is feeling much better and would like to go home today. No further seizures or spells of prolonged unresponsiveness. MRI brain attempted but unsuccessfully due to severe claustrophobia even with sedatives. EEG normal.   OBJECTIVE:                                                                                                                           Vital signs in last 24 hours: Temp:  [97.7 F (36.5 C)-99.3 F (37.4 C)] 99.3 F (37.4 C) (09/16 0946) Pulse Rate:  [66-91] 84 (09/16 0946) Resp:  [16-20] 20 (09/16 0946) BP: (86-119)/(53-98) 117/73 mmHg (09/16 0946) SpO2:  [94 %-100 %] 96 % (09/16 0946)  Intake/Output from previous day: 09/15 0701 - 09/16 0700 In: -  Out: 2 [Urine:1; Emesis/NG output:1] Intake/Output this shift: Total I/O In: 60 [P.O.:60] Out: -  Nutritional status: General  Past Medical History  Diagnosis Date  . GERD (gastroesophageal reflux disease)   . Hyperlipidemia   . History of blood transfusion   . Seizures   . Chronic headaches   . Phlebitis   . History of colonic polyps   . Anxiety   . Behcet's disease   Mental Status: Alert, oriented, thought content appropriate.  Speech fluent without evidence of aphasia.  Able to follow 3 step commands without difficulty. Cranial Nerves: II: Discs flat bilaterally; Visual fields grossly normal, pupils equal, round, reactive to light and accommodation III,IV, VI: ptosis not present, extra-ocular motions intact bilaterally V,VII: smile symmetric, facial light touch sensation normal bilaterally VIII: hearing normal bilaterally IX,X: gag reflex present XI: bilateral shoulder shrug XII: midline tongue extension Motor: Right : Upper extremity   5/5    Left:     Upper extremity   5/5  Lower  extremity   5/5     Lower extremity   5/5 Tone and bulk:normal tone throughout; no atrophy noted Sensory: Pinprick and light touch intact throughout, bilaterally Deep Tendon Reflexes:  Right: Upper Extremity   Left: Upper extremity  biceps (C-5 to C-6) 2/4   biceps (C-5 to C-6) 2/4 tricep (C7) 2/4    triceps (C7) 2/4 Brachioradialis (C6) 2/4  Brachioradialis (C6) 2/4  Lower Extremity Lower Extremity  quadriceps (L-2 to L-4) 2/4   quadriceps (L-2 to L-4) 2/4 Achilles (S1) 2/4   Achilles (S1) 2/4  Plantars: Right: downgoing   Left: downgoing Cerebellar: normal finger-to-nose,  normal heel-to-shin test Gait: No tested. CV: pulses palpable throughout    Neurologic Exam:    Lab Results: Lab Results  Component Value Date/Time   CHOL 234* 03/02/2013 11:15 AM   Lipid Panel No results found for this basename: CHOL, TRIG, HDL, CHOLHDL, VLDL, LDLCALC,  in the last 72 hours  Studies/Results: Dg Chest Port 1 View  08/27/2013   *RADIOLOGY REPORT*  Clinical Data: Short of breath  PORTABLE CHEST - 1 VIEW  Comparison: Prior chest x-ray 08/26/2013  Findings: Left subclavian approach single lumen portacatheter.  The tip of the catheter projects over the superior cavoatrial junction. Stable cardiac and mediastinal contours.  Inspiratory volumes are low and there is mild bibasilar opacities unchanged in appearance compared to yesterday.  No new pleural effusion or pneumothorax. No acute osseous abnormality.  IMPRESSION: Stable appearance of low inspiratory volumes and bibasilar atelectasis.   Original Report Authenticated By: Malachy Moan, M.D.    MEDICATIONS                                                                                                                       I have reviewed the patient's current medications.  ASSESSMENT/PLAN:                                                                                                           Recurrent paroxysmal spells of unclear  etiology. Normal EEG. I a not in favor of pursuing MRI brain under anesthesia. From a neurological standpoint she can go home today, continue her medications, and have outpatient neurology follow up.  Wyatt Portela, MD [facility] [phone]  08/29/2013, 9:48 AM

## 2013-08-29 NOTE — Progress Notes (Signed)
UR COMPLETED  

## 2013-08-29 NOTE — Discharge Summary (Addendum)
Physician Discharge Summary  Brenda Mcguire ZOX:[id number] DOB: [birth year] DOA: 08/25/2013  PCP: Nicki Reaper, NP  Admit date: 08/25/2013 Discharge date: 08/29/2013  Time spent: > 35 minutes  minutes  Recommendations for Outpatient Follow-up:  -F/u with PCP in 1-2 weeks -f/u with neurologist in 3-4 weeks  Discharge Diagnoses:  Principal Problem:   Seizures Active Problems:   Insomnia   Headache(784.0)   GERD (gastroesophageal reflux disease)   Discharge Condition: stable   Diet recommendation: heart healthy   Filed Weights   08/25/13 1900  Weight: 78.563 kg (173 lb 3.2 oz)    History of present illness:  52 y.o. female with Past medical history of seizure and pseudoseizure, behcet's disease, anxiety, GERD admitted with seizures    Hospital Course:   1. Seizures of unclear etiology (?5 times total intubated/extubated same day); ? Pseudoseizure; no seizure since admission 9/13  -thought possible Tramadol induced; d/w patient recommended to stop tramadol  -EEG: no seizure activity; patient was evaluated by neurology who reccommended to cont keppra, depakote current dose   Patient had unexplained several symptoms  Events: 08/27/13  Called to evaluated the Patient for unresponsiveness;  She became unresponsive after taking her afternoon medications xanax, and bronchodilator; no witnessed seizure;  -VS 106/58; HR 107; POX 100%; she is not responding to noxious stimuli  -but I saw her opening her eye, however she does not respond to commands when asked  -later symptoms resolved without intervention; Events:  -08/27/13: She had episode on admission with unresponsiveness; thought ? Seizure related; patient got intubated then woke up and requested to be extubated  -08/26/13: patient had some SOB, hyperventilation ; evaluated by night team exam was unremarkable; but she requested to be intubated without any obvious Indications; but later symptoms resolved;  -9/14//14 Another episode  of shallow breathing with normal vital signs; oxygenation normal; ? -resolved, no intervention   I have offered patient psychiatry evaluation since work related stress; she declined wanted to f/u outpatient, denies depression, no suicidal plans  2. GERD, stable cont PPI  -nausea cont zofran  3. S/p extubation for airway protection with seizures  -CXR: atelectasis vs infiltrate; empirically started atx for possible pneumonia;  -during 48 hours clinically afebrile; no leukocytosis, low suspicious for pneumonia;   4. H/o low tsh ; recheck tsh-0.2; free T4 WNL  5. Patient has porta cath (use for chemo for behcet's); ? Reason at this time; d/w her may need to consider to remove; since never used for chemo 4-5 years; she declined wants to keep it.   Informed the patient about driving restrictions with seizure;    Procedures:  CXR, CT head  (i.e. Studies not automatically included, echos, thoracentesis, etc; not x-rays)  Consultations:  Neurology   Discharge Exam: Filed Vitals:   08/29/13 0946  BP: 117/73  Pulse: 84  Temp: 99.3 F (37.4 C)  Resp: 20    General: alert, oriented  Cardiovascular: S1, S2 rrr Respiratory: CTA BL   Discharge Instructions  Discharge Orders   Future Orders Complete By Expires   Diet - low sodium heart healthy  As directed    Discharge instructions  As directed    Comments:     Follow up with primary care doctor in 1-2 weeks   Increase activity slowly  As directed        Medication List    STOP taking these medications       ALPRAZolam 0.5 MG tablet  Commonly known as:  Prudy Feeler  HYDROcodone-acetaminophen 5-325 MG per tablet  Commonly known as:  NORCO     promethazine 12.5 MG tablet  Commonly known as:  PHENERGAN     traMADol 50 MG tablet  Commonly known as:  ULTRAM      TAKE these medications       amoxicillin-clavulanate 875-125 MG per tablet  Commonly known as:  AUGMENTIN  Take 1 tablet by mouth 2 (two) times daily.      divalproex 250 MG DR tablet  Commonly known as:  DEPAKOTE  Take 750 mg by mouth 3 (three) times daily.     gabapentin 300 MG capsule  Commonly known as:  NEURONTIN  Take 600 mg by mouth 3 (three) times daily.     glucagon 1 MG injection  Inject 1 mg into the vein once as needed.     hydroxychloroquine 200 MG tablet  Commonly known as:  PLAQUENIL  Take 1 tablet (200 mg total) by mouth 2 (two) times daily.     levETIRAcetam 500 MG tablet  Commonly known as:  KEPPRA  Take 1 tablet (500 mg total) by mouth 2 (two) times daily.     mirtazapine 15 MG disintegrating tablet  Commonly known as:  REMERON SOL-TAB  Take 1 tablet (15 mg total) by mouth at bedtime.     ondansetron 4 MG disintegrating tablet  Commonly known as:  ZOFRAN ODT  4mg  ODT q4 hours prn nausea/vomit     zolpidem 12.5 MG CR tablet  Commonly known as:  AMBIEN CR  Take 1 tablet (12.5 mg total) by mouth at bedtime as needed for sleep.       Allergies  Allergen Reactions  . Iohexol Anaphylaxis, Hives and Shortness Of Breath  . Codeine Itching  . Hepatitis B Vaccine   . 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 Other     CT Contrast       Follow-up Information   Follow up with BAITY, REGINA, NP In 1 week.   Specialty:  Internal Medicine   Contact information:   [address] [place] [address] [phone]        The results of significant diagnostics from this hospitalization (including imaging, microbiology, ancillary and laboratory) are listed below for reference.    Significant Diagnostic Studies: Dg Chest Port 1 View  08/27/2013   *RADIOLOGY REPORT*  Clinical Data: Short of breath  PORTABLE CHEST - 1 VIEW  Comparison: Prior chest x-ray 08/26/2013  Findings: Left subclavian approach single lumen portacatheter.  The tip of the catheter projects over the superior cavoatrial junction. Stable cardiac and mediastinal contours.  Inspiratory volumes are low and there is mild bibasilar opacities  unchanged in appearance compared to yesterday.  No new pleural effusion or pneumothorax. No acute osseous abnormality.  IMPRESSION: Stable appearance of low inspiratory volumes and bibasilar atelectasis.   Original Report Authenticated By: Malachy Moan, M.D.   Dg Chest Port 1 View  08/26/2013   CLINICAL DATA:  Diminished breath sounds.  EXAM: PORTABLE CHEST - 1 VIEW  COMPARISON:  None.  FINDINGS: Left Port-A-Cath is in place with the tip at the cavoatrial junction. Low lung volumes with bibasilar atelectasis. Heart size is accentuated by the portable nature of the study and low volumes, likely within normal limits. No visible effusions or acute bony abnormality.  IMPRESSION: Low lung volumes, bibasilar atelectasis.   Electronically Signed   By: Charlett Nose M.D.   On: 08/26/2013 17:25  Microbiology: No results found for this or any previous visit (from the past 240 hour(s)).   Labs: Basic Metabolic Panel:  Recent Labs Lab 08/25/13 2253 08/26/13 0500 08/27/13 1020  NA 140 140 143  K 4.0 3.8 4.1  CL 105 106 108  CO2 22 24 26   GLUCOSE 129* 83 84  BUN 11 13 14   CREATININE 0.64 0.72 0.88  CALCIUM 9.6 8.8 8.6   Liver Function Tests:  Recent Labs Lab 08/25/13 2253 08/26/13 0500 08/27/13 1020  AST 16 15 13   ALT 13 11 10   ALKPHOS 79 68 78  BILITOT 0.3 0.4 0.2*  PROT 7.3 6.5 6.4  ALBUMIN 3.7 3.5 3.4*   No results found for this basename: LIPASE, AMYLASE,  in the last 168 hours No results found for this basename: AMMONIA,  in the last 168 hours CBC:  Recent Labs Lab 08/25/13 2253 08/26/13 0500 08/27/13 1020  WBC 9.1 9.1 8.3  NEUTROABS  --  5.3  --   HGB 11.5* 10.4* 10.9*  HCT 33.2* 30.6* 32.6*  MCV 88.5 90.0 90.8  PLT 279 231 225   Cardiac Enzymes:  Recent Labs Lab 08/25/13 2253 08/25/13 2255 08/26/13 0500  CKTOTAL 222*  --  212*  TROPONINI  --  <0.30  --    BNP: BNP (last 3 results) No results found for this basename: PROBNP,  in the last 8760  hours CBG:  Recent Labs Lab 08/26/13 0004 08/26/13 0730  GLUCAP 126* 86       Signed:  Jancarlo Biermann N  [facility] 08/29/2013, 10:07 AM

## 2013-08-30 ENCOUNTER — Encounter: Payer: Self-pay | Admitting: Internal Medicine

## 2013-08-31 ENCOUNTER — Encounter: Payer: Self-pay | Admitting: Internal Medicine

## 2013-08-31 ENCOUNTER — Ambulatory Visit (INDEPENDENT_AMBULATORY_CARE_PROVIDER_SITE_OTHER): Payer: Federal, State, Local not specified - PPO | Admitting: Internal Medicine

## 2013-08-31 VITALS — BP 110/80 | HR 76 | Temp 100.0°F | Wt 173.6 lb

## 2013-08-31 DIAGNOSIS — R51 Headache: Secondary | ICD-10-CM

## 2013-08-31 DIAGNOSIS — R569 Unspecified convulsions: Secondary | ICD-10-CM

## 2013-08-31 MED ORDER — HYDROCODONE-ACETAMINOPHEN 5-325 MG PO TABS: 1.0000 | ORAL_TABLET | Freq: Two times a day (BID) | ORAL | Status: DC

## 2013-08-31 NOTE — Progress Notes (Signed)
Subjective:    Patient ID: Brenda Mcguire, female    DOB: 05/06/1961, 52 y.o.   MRN: [id number]  HPI  Pt presents to the clinic today for her hospital f/u. She was admitted on 08/25/2013 secondary to a seizure. The neurologist believed it might have been related to the tramadol versus work related stress. She was intubated for airway protection. The tramadol was D/c'd/ She continues on Keppra and Depakote. She was offered psych referral for work related stress but pt declined. Since being discharged, she is doing fine. She has been off her tramadol which is the only thing that has worked for her headaches. She was taking it 3 times per day. She does request something else in its place.  Review of Systems      Past Medical History  Diagnosis Date  . GERD (gastroesophageal reflux disease)   . Hyperlipidemia   . History of blood transfusion   . Seizures   . Chronic headaches   . Phlebitis   . History of colonic polyps   . Anxiety   . Behcet's disease     Current Outpatient Prescriptions  Medication Sig Dispense Refill  . amoxicillin-clavulanate (AUGMENTIN) 875-125 MG per tablet Take 1 tablet by mouth 2 (two) times daily.  4 tablet  0  . divalproex (DEPAKOTE) 250 MG DR tablet Take 750 mg by mouth 3 (three) times daily.      Marland Kitchen gabapentin (NEURONTIN) 300 MG capsule Take 600 mg by mouth 3 (three) times daily.      Marland Kitchen glucagon 1 MG injection Inject 1 mg into the vein once as needed.  1 each  12  . hydroxychloroquine (PLAQUENIL) 200 MG tablet Take 1 tablet (200 mg total) by mouth 2 (two) times daily.  60 tablet  3  . levETIRAcetam (KEPPRA) 500 MG tablet Take 1 tablet (500 mg total) by mouth 2 (two) times daily.  60 tablet  3  . mirtazapine (REMERON SOL-TAB) 15 MG disintegrating tablet Take 1 tablet (15 mg total) by mouth at bedtime.  30 tablet  2  . ondansetron (ZOFRAN ODT) 4 MG disintegrating tablet 4mg  ODT q4 hours prn nausea/vomit  4 tablet  0  . zolpidem (AMBIEN CR) 12.5 MG CR tablet Take  1 tablet (12.5 mg total) by mouth at bedtime as needed for sleep.  30 tablet  0   No current facility-administered medications for this visit.    Allergies  Allergen Reactions  . Iohexol Anaphylaxis, Hives and Shortness Of Breath  . Codeine Itching  . Hepatitis B Vaccine   . 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 Other     CT Contrast    Family History  Problem Relation Age of Onset  . Diabetes Mother   . Hyperlipidemia Mother   . Stroke Mother   . Hypertension Mother   . Colon cancer Mother   . Breast cancer Mother   . Early death Father   . Heart attack Father   . Heart disease Father   . Hyperlipidemia Father   . COPD Sister     History   Social History  . Marital Status: Married    Spouse Name: N/A    Number of Children: 2  . Years of Education: 14   Occupational History  . Nurse     [employer]  .     Social History Main Topics  . Smoking status: Never Smoker   . Smokeless tobacco: Never Used  . Alcohol  Use: No  . Drug Use: No  . Sexual Activity: Yes    Birth Control/ Protection: Surgical   Other Topics Concern  . Not on file   Social History Narrative   Regular exercise-no   Caffeine Use-yes     Constitutional: Denies fever, malaise, fatigue, headache or abrupt weight changes.  Respiratory: Denies difficulty breathing, shortness of breath, cough or sputum production.   Cardiovascular: Denies chest pain, chest tightness, palpitations or swelling in the hands or feet.  Neurological: Denies dizziness, difficulty with memory, difficulty with speech or problems with balance and coordination.   No other specific complaints in a complete review of systems (except as listed in HPI above).  Objective:   Physical Exam   BP 110/80  Pulse 76  Temp(Src) 100 F (37.8 C) (Oral)  Wt 173 lb 9.6 oz (78.744 kg)  BMI 24.91 kg/m2  SpO2 97% Wt Readings from Last 3 Encounters:  08/31/13 173 lb 9.6 oz (78.744 kg)  08/25/13 173 lb 3.2 oz  (78.563 kg)  04/14/13 174 lb (78.926 kg)    General: Appears her stated age, well developed, well nourished in NAD. Cardiovascular: Normal rate and rhythm. S1,S2 noted.  No murmur, rubs or gallops noted. No JVD or BLE edema. No carotid bruits noted. Pulmonary/Chest: Normal effort and positive vesicular breath sounds. No respiratory distress. No wheezes, rales or ronchi noted.  Neurological: Alert and oriented. Cranial nerves II-XII intact. Coordination normal. +DTRs bilaterally. :  BMET    Component Value Date/Time   NA 143 08/27/2013 1020   K 4.1 08/27/2013 1020   CL 108 08/27/2013 1020   CO2 26 08/27/2013 1020   GLUCOSE 84 08/27/2013 1020   BUN 14 08/27/2013 1020   CREATININE 0.88 08/27/2013 1020   CALCIUM 8.6 08/27/2013 1020   GFRNONAA 75* 08/27/2013 1020   GFRAA 87* 08/27/2013 1020    Lipid Panel     Component Value Date/Time   CHOL 234* 03/02/2013 1115   TRIG 83.0 03/02/2013 1115   HDL 45.90 03/02/2013 1115   CHOLHDL 5 03/02/2013 1115   VLDL 16.6 03/02/2013 1115    CBC    Component Value Date/Time   WBC 8.3 08/27/2013 1020   RBC 3.59* 08/27/2013 1020   HGB 10.9* 08/27/2013 1020   HCT 32.6* 08/27/2013 1020   PLT 225 08/27/2013 1020   MCV 90.8 08/27/2013 1020   MCH 30.4 08/27/2013 1020   MCHC 33.4 08/27/2013 1020   RDW 13.0 08/27/2013 1020   LYMPHSABS 3.1 08/26/2013 0500   MONOABS 0.8 08/26/2013 0500   EOSABS 0.0 08/26/2013 0500   BASOSABS 0.0 08/26/2013 0500    Hgb A1C No results found for this basename: HGBA1C         Assessment & Plan:

## 2013-08-31 NOTE — Assessment & Plan Note (Signed)
Well controlled on depakote and keppra Continue same Followup with neurology

## 2013-08-31 NOTE — Patient Instructions (Signed)
Stress Management Stress is a state of physical or mental tension that often results from changes in your life or normal routine. Some common causes of stress are:  Death of a loved one.  Injuries or severe illnesses.  Getting fired or changing jobs.  Moving into a new home. Other causes may be:  Sexual problems.  Business or financial losses.  Taking on a large debt.  Regular conflict with someone at home or at work.  Constant tiredness from lack of sleep. It is not just bad things that are stressful. It may be stressful to:  Win the lottery.  Get married.  Buy a new car. The amount of stress that can be easily tolerated varies from person to person. Changes generally cause stress, regardless of the types of change. Too much stress can affect your health. It may lead to physical or emotional problems. Too little stress (boredom) may also become stressful. SUGGESTIONS TO REDUCE STRESS:  Talk things over with your family and friends. It often is helpful to share your concerns and worries. If you feel your problem is serious, you may want to get help from a professional counselor.  Consider your problems one at a time instead of lumping them all together. Trying to take care of everything at once may seem impossible. List all the things you need to do and then start with the most important one. Set a goal to accomplish 2 or 3 things each day. If you expect to do too many in a single day you will naturally fail, causing you to feel even more stressed.  Do not use alcohol or drugs to relieve stress. Although you may feel better for a short time, they do not remove the problems that caused the stress. They can also be habit forming.  Exercise regularly - at least 3 times per week. Physical exercise can help to relieve that "uptight" feeling and will relax you.  The shortest distance between despair and hope is often a good night's sleep.  Go to bed and get up on time allowing  yourself time for appointments without being rushed.  Take a short "time-out" period from any stressful situation that occurs during the day. Close your eyes and take some deep breaths. Starting with the muscles in your face, tense them, hold it for a few seconds, then relax. Repeat this with the muscles in your neck, shoulders, hand, stomach, back and legs.  Take good care of yourself. Eat a balanced diet and get plenty of rest.  Schedule time for having fun. Take a break from your daily routine to relax. HOME CARE INSTRUCTIONS   Call if you feel overwhelmed by your problems and feel you can no longer manage them on your own.  Return immediately if you feel like hurting yourself or someone else. Document Released: 05/26/2001 Document Revised: 02/22/2012 Document Reviewed: 01/16/2008 Exit[employer] Patient Information 2014 [employer].  

## 2013-08-31 NOTE — Assessment & Plan Note (Signed)
Switch tramadol to norco # 60 no early refills, no increase in dose. Only take BID if needed followup with your neurologist

## 2013-09-01 ENCOUNTER — Encounter: Payer: Self-pay | Admitting: Internal Medicine

## 2013-09-04 ENCOUNTER — Encounter: Payer: Self-pay | Admitting: Internal Medicine

## 2013-09-05 MED ORDER — DIVALPROEX SODIUM 250 MG PO DR TAB: 750.0000 mg | DELAYED_RELEASE_TABLET | Freq: Three times a day (TID) | ORAL | Status: DC

## 2013-09-07 ENCOUNTER — Encounter: Payer: Self-pay | Admitting: Internal Medicine

## 2013-09-11 ENCOUNTER — Ambulatory Visit (INDEPENDENT_AMBULATORY_CARE_PROVIDER_SITE_OTHER): Payer: Federal, State, Local not specified - PPO | Admitting: Internal Medicine

## 2013-09-11 ENCOUNTER — Encounter: Payer: Self-pay | Admitting: Internal Medicine

## 2013-09-11 ENCOUNTER — Ambulatory Visit (INDEPENDENT_AMBULATORY_CARE_PROVIDER_SITE_OTHER)
Admission: RE | Admit: 2013-09-11 | Discharge: 2013-09-11 | Disposition: A | Discharge status: Home / Self Care | Payer: Federal, State, Local not specified - PPO | Source: Ambulatory Visit | Attending: Internal Medicine | Admitting: Internal Medicine

## 2013-09-11 VITALS — BP 106/78 | HR 98 | Temp 100.0°F | Wt 178.6 lb

## 2013-09-11 DIAGNOSIS — R509 Fever, unspecified: Secondary | ICD-10-CM

## 2013-09-11 DIAGNOSIS — R0602 Shortness of breath: Secondary | ICD-10-CM

## 2013-09-11 DIAGNOSIS — R6883 Chills (without fever): Secondary | ICD-10-CM

## 2013-09-11 DIAGNOSIS — R569 Unspecified convulsions: Secondary | ICD-10-CM

## 2013-09-11 NOTE — Patient Instructions (Signed)
Valproic Acid, Divalproex Sodium delayed or extended-release tablets What is this medicine? DIVALPROEX SODIUM (dye VAL pro ex SO dee um) is used to prevent seizures caused by some forms of epilepsy. It is also used to treat bipolar mania and to prevent migraine headaches. This medicine may be used for other purposes; ask your health care provider or pharmacist if you have questions. What should I tell my health care provider before I take this medicine? They need to know if you have any of these conditions: -blood disease -brain damage or disease -kidney disease -liver disease -low blood proteins -suicidal thoughts, plans, or attempt; a previous suicide attempt by you or a family member -urea cycle disorder (UCD) -an unusual or allergic reaction to divalproex sodium, other medicines, foods, dyes, or preservatives -pregnant or trying to get pregnant -breast-feeding How should I use this medicine? Take this medicine by mouth with a drink of water. Follow the directions on the prescription label. Do not crush or chew. If this medicine upsets your stomach, take it with food or milk. Take your medicine at regular intervals. Do not take it more often than directed. Talk to your pediatrician regarding the use of this medicine in children. Special care may be needed. Overdosage: If you think you have taken too much of this medicine contact a poison control center or emergency room at once. NOTE: This medicine is only for you. Do not share this medicine with others. What if I miss a dose? If you miss a dose, take it as soon as you can. If it is almost time for your next dose, take only that dose. Do not take double or extra doses. What may interact with this medicine? -aspirin -barbiturates, like phenobarbital -diazepam -isoniazid -medicines for depression, anxiety, or psychotic disturbances -medicines that treat or prevent blood clots like warfarin -meropenem -other seizure  medicines -rifampin -tolbutamide -zidovudine This list may not describe all possible interactions. Give your health care provider a list of all the medicines, herbs, non-prescription drugs, or dietary supplements you use. Also tell them if you smoke, drink alcohol, or use illegal drugs. Some items may interact with your medicine. What should I watch for while using this medicine? Visit your doctor or health care professional for regular checks on your progress. If you are taking this medicine to treat epilepsy (seizures), do not stop taking it suddenly. This increases the risk of seizures. Wear a medical identification bracelet or chain to say you have epilepsy or seizures, and carry a card that lists all your medicines. You may get drowsy, dizzy, or have blurred vision. Do not drive, use machinery, or do anything that needs mental alertness until you know how this medicine affects you. To reduce dizzy or fainting spells, do not sit or stand up quickly, especially if you are an older patient. Alcohol can increase drowsiness and dizziness. Avoid alcoholic drinks. This medicine can cause blood problems. This can mean slow healing and a risk of infection. Problems can arise if you need dental work, and in the day to day care of your teeth. Try to avoid damage to your teeth and gums when you brush or floss your teeth. This medicine can make you more sensitive to the sun. Keep out of the sun. If you cannot avoid being in the sun, wear protective clothing and use sunscreen. Do not use sun lamps or tanning beds/booths. The use of this medicine may increase the chance of suicidal thoughts or actions. Pay special attention to how you  are responding while on this medicine. Any worsening of mood, or thoughts of suicide or dying should be reported to your health care professional right away. Women who become pregnant while using this medicine may enroll in the [place] Antiepileptic Drug Pregnancy Registry by  calling [phone]. This registry collects information about the safety of antiepileptic drug use during pregnancy. Contact your doctor or healthcare professional if you notice any part of your medicine in your stool. Your healthcare provider may want to check the amount of medicine in your blood if this happens. What side effects may I notice from receiving this medicine? Side effects that you should report to your doctor or health care professional as soon as possible: -allergic reactions like skin rash, itching or hives, swelling of the face, lips, or tongue -changes in the frequency or severity of seizures -double vision or uncontrollable eye movements -nausea and vomiting -redness, blistering, peeling or loosening of the skin, including inside the mouth -stomach pain or cramps -trembling of hands or arms -unusual bleeding or bruising or pinpoint red spots on the skin -unusual swelling of the arms or legs -unusually weak or tired -worsening of mood, thoughts or actions of suicide or dying -yellowing of skin or eyes Side effects that usually do not require medical attention (report to your doctor or health care professional if they continue or are bothersome): -change in menstrual cycle -diarrhea or constipation -headache -loss of bladder control -loss of hair or unusual growth of hair -loss or increase in appetite -weight gain or loss This list may not describe all possible side effects. Call your doctor for medical advice about side effects. You may report side effects to FDA at 1-800-FDA-1088. Where should I keep my medicine? Keep out of reach of children. Store at room temperature between 15 and 30 degrees C (59 and 86 degrees F). Keep container tightly closed. Throw away any unused medicine after the expiration date. NOTE: This sheet is a summary. It may not cover all possible information. If you have questions about this medicine, talk to your doctor, pharmacist, or health  care provider.  2013, [employer]. (04/04/2012 12:56:56 PM)

## 2013-09-11 NOTE — Progress Notes (Signed)
HPI: Pt presents to the office with complaints of fatigue, shortness of breath, dry cough, and possible syncopal episode. Pt was recently discharged from hospital in 9/12 for seizures, and symptoms having been ongoing since discharge. Pt expresses concern for increased fatigue and states she woke up on the bathroom floor last night and does not remember how she got there. Recently started a depakote and was supposed to follow up with neuro, however has not made an appointment. Denies chest pain, chest tightness, congestion, sputum production, or nasal discharge.   Past Medical History  Diagnosis Date  . GERD (gastroesophageal reflux disease)   . Hyperlipidemia   . History of blood transfusion   . Seizures   . Chronic headaches   . Phlebitis   . History of colonic polyps   . Anxiety   . Behcet's disease     Current Outpatient Prescriptions  Medication Sig Dispense Refill  . divalproex (DEPAKOTE) 250 MG DR tablet Take 3 tablets (750 mg total) by mouth 3 (three) times daily.  90 tablet  2  . gabapentin (NEURONTIN) 300 MG capsule Take 600 mg by mouth 3 (three) times daily.      Marland Kitchen glucagon 1 MG injection Inject 1 mg into the vein once as needed.  1 each  12  . HYDROcodone-acetaminophen (NORCO/VICODIN) 5-325 MG per tablet Take 1 tablet by mouth every 12 (twelve) hours.  60 tablet  0  . hydroxychloroquine (PLAQUENIL) 200 MG tablet Take 1 tablet (200 mg total) by mouth 2 (two) times daily.  60 tablet  3  . levETIRAcetam (KEPPRA) 500 MG tablet Take 1 tablet (500 mg total) by mouth 2 (two) times daily.  60 tablet  3  . mirtazapine (REMERON SOL-TAB) 15 MG disintegrating tablet Take 1 tablet (15 mg total) by mouth at bedtime.  30 tablet  2  . ondansetron (ZOFRAN ODT) 4 MG disintegrating tablet 4mg  ODT q4 hours prn nausea/vomit  4 tablet  0  . zolpidem (AMBIEN CR) 12.5 MG CR tablet Take 1 tablet (12.5 mg total) by mouth at bedtime as needed for sleep.  30 tablet  0   No current facility-administered  medications for this visit.    Allergies  Allergen Reactions  . Iohexol Anaphylaxis, Hives and Shortness Of Breath  . Codeine Itching  . Hepatitis B Vaccine   . 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 Other     CT Contrast    Family History  Problem Relation Age of Onset  . Diabetes Mother   . Hyperlipidemia Mother   . Stroke Mother   . Hypertension Mother   . Colon cancer Mother   . Breast cancer Mother   . Early death Father   . Heart attack Father   . Heart disease Father   . Hyperlipidemia Father   . COPD Sister     History   Social History  . Marital Status: Married    Spouse Name: N/A    Number of Children: 2  . Years of Education: 14   Occupational History  . Nurse     [employer]  .     Social History Main Topics  . Smoking status: Never Smoker   . Smokeless tobacco: Never Used  . Alcohol Use: No  . Drug Use: No  . Sexual Activity: Yes    Birth Control/ Protection: Surgical   Other Topics Concern  . Not on file   Social History Narrative   Regular exercise-no  Caffeine Use-yes    ROS:  Constitutional: Denies fever, malaise, fatigue, headache or abrupt weight changes.  HEENT: Denies eye pain, eye redness, ear pain, ringing in the ears, wax buildup, runny nose, nasal congestion, bloody nose, or sore throat. Respiratory: Endorses shortness of breath and dry cough; Denies difficulty breathing or sputum production.   Cardiovascular: Denies chest pain, chest tightness, palpitations or swelling in the hands or feet.  Musculoskeletal: Denies decrease in range of motion, difficulty with gait, muscle pain or joint pain and swelling.  Neurological: Endorses syncope and dizziness. Denies difficulty with memory, difficulty with speech or problems with balance and coordination.   No other specific complaints in a complete review of systems (except as listed in HPI above).  PE:  BP 106/78  Pulse 98  Temp(Src) 100 F (37.8 C) (Oral)  Wt  178 lb 9 oz (80.995 kg)  BMI 25.62 kg/m2  SpO2 99% Wt Readings from Last 3 Encounters:  09/11/13 178 lb 9 oz (80.995 kg)  08/31/13 173 lb 9.6 oz (78.744 kg)  08/25/13 173 lb 3.2 oz (78.563 kg)    General: Appears their stated age, well developed, well nourished in NAD. HEENT: Head: normal shape and size; Eyes: sclera white, no icterus, conjunctiva pink, PERRLA and EOMs intact; Ears: Tm's gray and intact, normal light reflex; Nose: mucosa pink and moist, septum midline; Throat/Mouth: Teeth present, mucosa pink and moist, no lesions or ulcerations noted.  Neck: Normal range of motion. Neck supple, trachea midline. No massses, lumps or thyromegaly present.  Cardiovascular: Normal rate and rhythm. S1,S2 noted.  No murmur, rubs or gallops noted. No JVD or BLE edema. No carotid bruits noted. Pulmonary/Chest: Normal effort and positive vesicular breath sounds. No respiratory distress. No wheezes, rales or ronchi noted. No tactile fremitus or egophonay.  Lungs resonant to percussion. Neurological: Alert and oriented. Cranial nerves II-XII intact. Coordination normal. +DTRs bilaterally. Psychiatric: Mood and affect normal. Behavior is tearful/anxious. Judgment and thought content normal.     Assessment and Plan:  Shortness of breath/Cough Chest x-ray per patient request; will call with results Tylenol 325mg  1-2 tablets by mouth every 4-6 hours as needed for fevers Increase fluids and rest Follow up in 5 days if symptoms no better  Fatigue: Follow with Neurology as soon as possible to check medication levels  Camarion Weier, Jacques Earthly, Student-NP

## 2013-09-11 NOTE — Assessment & Plan Note (Signed)
Please call and make your follow up appt with neurology

## 2013-09-11 NOTE — Progress Notes (Signed)
Subjective:    Patient ID: Brenda Mcguire, female    DOB: 02/04/1961, 52 y.o.   MRN: [id number]  HPI  Pt presents to the clinic today with c/o SOB, chills, fatigue and loss of consciousness. This has been going on since she left the hospital on 9/12 for seizures. She also reports a possible seizure/syncopal episode with + LOC last night. She report she got up to go use the bathroom and woke up some time later on the floor. She was laying in urine. She is very concerned because she is not sure what is going on. She has not yet made a followup appointment with neurology. She does report increased stress.  Review of Systems  Past Medical History  Diagnosis Date  . GERD (gastroesophageal reflux disease)   . Hyperlipidemia   . History of blood transfusion   . Seizures   . Chronic headaches   . Phlebitis   . History of colonic polyps   . Anxiety   . Behcet's disease     Current Outpatient Prescriptions  Medication Sig Dispense Refill  . amoxicillin-clavulanate (AUGMENTIN) 875-125 MG per tablet Take 1 tablet by mouth 2 (two) times daily.  4 tablet  0  . divalproex (DEPAKOTE) 250 MG DR tablet Take 3 tablets (750 mg total) by mouth 3 (three) times daily.  90 tablet  2  . gabapentin (NEURONTIN) 300 MG capsule Take 600 mg by mouth 3 (three) times daily.      Marland Kitchen glucagon 1 MG injection Inject 1 mg into the vein once as needed.  1 each  12  . HYDROcodone-acetaminophen (NORCO/VICODIN) 5-325 MG per tablet Take 1 tablet by mouth every 12 (twelve) hours.  60 tablet  0  . hydroxychloroquine (PLAQUENIL) 200 MG tablet Take 1 tablet (200 mg total) by mouth 2 (two) times daily.  60 tablet  3  . levETIRAcetam (KEPPRA) 500 MG tablet Take 1 tablet (500 mg total) by mouth 2 (two) times daily.  60 tablet  3  . mirtazapine (REMERON SOL-TAB) 15 MG disintegrating tablet Take 1 tablet (15 mg total) by mouth at bedtime.  30 tablet  2  . ondansetron (ZOFRAN ODT) 4 MG disintegrating tablet 4mg  ODT q4 hours prn  nausea/vomit  4 tablet  0  . zolpidem (AMBIEN CR) 12.5 MG CR tablet Take 1 tablet (12.5 mg total) by mouth at bedtime as needed for sleep.  30 tablet  0   No current facility-administered medications for this visit.    Allergies  Allergen Reactions  . Iohexol Anaphylaxis, Hives and Shortness Of Breath  . Codeine Itching  . Hepatitis B Vaccine   . 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 Other     CT Contrast    Family History  Problem Relation Age of Onset  . Diabetes Mother   . Hyperlipidemia Mother   . Stroke Mother   . Hypertension Mother   . Colon cancer Mother   . Breast cancer Mother   . Early death Father   . Heart attack Father   . Heart disease Father   . Hyperlipidemia Father   . COPD Sister     History   Social History  . Marital Status: Married    Spouse Name: N/A    Number of Children: 2  . Years of Education: 14   Occupational History  . Nurse     [employer]  .     Social History Main Topics  . Smoking  status: Never Smoker   . Smokeless tobacco: Never Used  . Alcohol Use: No  . Drug Use: No  . Sexual Activity: Yes    Birth Control/ Protection: Surgical   Other Topics Concern  . Not on file   Social History Narrative   Regular exercise-no   Caffeine Use-yes     Constitutional: Pt reports fever, chills. Denies malaise, fatigue, headache or abrupt weight changes.  HEENT: Denies eye pain, eye redness, ear pain, ringing in the ears, wax buildup, runny nose, nasal congestion, bloody nose, or sore throat. Respiratory: Pt reports shortness of breath. Denies difficulty breathing, cough or sputum production.   Cardiovascular: Denies chest pain, chest tightness, palpitations or swelling in the hands or feet.   Neurological: Denies dizziness, difficulty with memory, difficulty with speech or problems with balance and coordination.   No other specific complaints in a complete review of systems (except as listed in HPI above).      Objective:   Physical Exam   BP 106/78  Pulse 98  Temp(Src) 100 F (37.8 C) (Oral)  Wt 178 lb 9 oz (80.995 kg)  BMI 25.62 kg/m2  SpO2 99% Wt Readings from Last 3 Encounters:  09/11/13 178 lb 9 oz (80.995 kg)  08/31/13 173 lb 9.6 oz (78.744 kg)  08/25/13 173 lb 3.2 oz (78.563 kg)    General: Appears her stated age, well developed, well nourished in NAD. HEENT: Head: normal shape and size; Eyes: sclera white, no icterus, conjunctiva pink, PERRLA and EOMs intact; Ears: Tm's gray and intact, normal light reflex; Nose: mucosa pink and moist, septum midline; Throat/Mouth: Teeth present, mucosa pink and moist, no exudate, lesions or ulcerations noted.   Cardiovascular: Normal rate and rhythm. S1,S2 noted.  No murmur, rubs or gallops noted. No JVD or BLE edema. No carotid bruits noted. Pulmonary/Chest: Normal effort and positive vesicular breath sounds. No respiratory distress. No wheezes, rales or ronchi noted.  Neurological: Alert and oriented. Cranial nerves II-XII intact. Coordination normal. +DTRs bilaterally.   BMET    Component Value Date/Time   NA 143 08/27/2013 1020   K 4.1 08/27/2013 1020   CL 108 08/27/2013 1020   CO2 26 08/27/2013 1020   GLUCOSE 84 08/27/2013 1020   BUN 14 08/27/2013 1020   CREATININE 0.88 08/27/2013 1020   CALCIUM 8.6 08/27/2013 1020   GFRNONAA 75* 08/27/2013 1020   GFRAA 87* 08/27/2013 1020    Lipid Panel     Component Value Date/Time   CHOL 234* 03/02/2013 1115   TRIG 83.0 03/02/2013 1115   HDL 45.90 03/02/2013 1115   CHOLHDL 5 03/02/2013 1115   VLDL 16.6 03/02/2013 1115    CBC    Component Value Date/Time   WBC 8.3 08/27/2013 1020   RBC 3.59* 08/27/2013 1020   HGB 10.9* 08/27/2013 1020   HCT 32.6* 08/27/2013 1020   PLT 225 08/27/2013 1020   MCV 90.8 08/27/2013 1020   MCH 30.4 08/27/2013 1020   MCHC 33.4 08/27/2013 1020   RDW 13.0 08/27/2013 1020   LYMPHSABS 3.1 08/26/2013 0500   MONOABS 0.8 08/26/2013 0500   EOSABS 0.0 08/26/2013 0500   BASOSABS 0.0  08/26/2013 0500    Hgb A1C No results found for this basename: HGBA1C         Assessment & Plan:   Shortness of breath, fever, chills, ? viral:  Will check chest xray to r/o Pneumonia Ibuprofen/tylenol for pain/fever You need to follow up with your neurologist for your seizures- ? Depakote making  you feel this bad  Will f/u as soon as results come back

## 2013-09-15 ENCOUNTER — Encounter: Payer: Self-pay | Admitting: Internal Medicine

## 2013-09-18 MED ORDER — GABAPENTIN 300 MG PO CAPS: 600.0000 mg | ORAL_CAPSULE | Freq: Three times a day (TID) | ORAL | Status: DC

## 2013-09-20 ENCOUNTER — Encounter: Payer: Self-pay | Admitting: Internal Medicine

## 2013-09-23 ENCOUNTER — Other Ambulatory Visit: Payer: Self-pay | Admitting: Internal Medicine

## 2013-09-25 ENCOUNTER — Encounter: Payer: Self-pay | Admitting: Internal Medicine

## 2013-09-25 NOTE — Telephone Encounter (Signed)
Script was fax back to pharmacy by Rueben Bash...Raechel Chute

## 2013-09-26 ENCOUNTER — Telehealth: Payer: Self-pay

## 2013-09-26 NOTE — Telephone Encounter (Signed)
PA approval for Zolpidem 12.5 mg was sent to [facility].

## 2013-09-30 ENCOUNTER — Encounter: Payer: Self-pay | Admitting: Internal Medicine

## 2013-09-30 ENCOUNTER — Other Ambulatory Visit: Payer: Self-pay | Admitting: Internal Medicine

## 2013-10-02 ENCOUNTER — Telehealth: Payer: Self-pay

## 2013-10-02 MED ORDER — HYDROCODONE-ACETAMINOPHEN 5-325 MG PO TABS: 1.0000 | ORAL_TABLET | Freq: Two times a day (BID) | ORAL | Status: DC

## 2013-10-02 NOTE — Telephone Encounter (Signed)
Left voicemail for patient stating that her Hydrocodone was ready for pick up. Script placed up front.

## 2013-10-19 ENCOUNTER — Other Ambulatory Visit: Payer: Self-pay | Admitting: Internal Medicine

## 2013-10-20 NOTE — Telephone Encounter (Signed)
Faxed hardcopy to [facility] [place]

## 2013-10-23 ENCOUNTER — Encounter: Payer: Self-pay | Admitting: Internal Medicine

## 2013-10-23 ENCOUNTER — Other Ambulatory Visit: Payer: Self-pay | Admitting: Internal Medicine

## 2013-10-29 ENCOUNTER — Encounter: Payer: Self-pay | Admitting: Internal Medicine

## 2013-10-30 ENCOUNTER — Encounter: Payer: Self-pay | Admitting: Internal Medicine

## 2013-10-31 ENCOUNTER — Encounter: Payer: Self-pay | Admitting: Internal Medicine

## 2013-10-31 MED ORDER — HYDROCODONE-ACETAMINOPHEN 5-325 MG PO TABS: 1.0000 | ORAL_TABLET | Freq: Two times a day (BID) | ORAL | Status: DC

## 2013-11-01 ENCOUNTER — Encounter: Payer: Self-pay | Admitting: Internal Medicine

## 2013-11-01 MED ORDER — ONDANSETRON 4 MG PO TBDP: ORAL_TABLET | ORAL | Status: DC

## 2013-11-01 MED ORDER — ZOLPIDEM TARTRATE ER 12.5 MG PO TBCR: EXTENDED_RELEASE_TABLET | ORAL | Status: DC

## 2013-11-01 MED ORDER — GABAPENTIN 300 MG PO CAPS: 600.0000 mg | ORAL_CAPSULE | Freq: Three times a day (TID) | ORAL | Status: DC

## 2013-11-01 MED ORDER — MIRTAZAPINE 15 MG PO TBDP: 15.0000 mg | ORAL_TABLET | Freq: Every day | ORAL | Status: DC

## 2013-11-01 MED ORDER — DIVALPROEX SODIUM 250 MG PO DR TAB: 750.0000 mg | DELAYED_RELEASE_TABLET | Freq: Three times a day (TID) | ORAL | Status: DC

## 2013-11-01 MED ORDER — PROMETHAZINE HCL 12.5 MG PO TABS: ORAL_TABLET | ORAL | Status: DC

## 2013-11-07 ENCOUNTER — Encounter: Payer: Self-pay | Admitting: Internal Medicine

## 2013-11-07 MED ORDER — ALPRAZOLAM 0.5 MG PO TABS: ORAL_TABLET | ORAL | Status: DC

## 2013-11-07 NOTE — Telephone Encounter (Signed)
rx called into pharmacy

## 2013-11-07 NOTE — Telephone Encounter (Signed)
Can you call this in

## 2013-11-10 ENCOUNTER — Inpatient Hospital Stay (HOSPITAL_COMMUNITY)
Admission: AD | Admit: 2013-11-10 | Discharge: 2013-11-15 | DRG: 100 | Disposition: A | Discharge status: Home / Self Care | Payer: Federal, State, Local not specified - PPO | Source: Other Acute Inpatient Hospital | Attending: Pulmonary Disease | Admitting: Pulmonary Disease

## 2013-11-10 ENCOUNTER — Inpatient Hospital Stay (HOSPITAL_COMMUNITY): Payer: Federal, State, Local not specified - PPO

## 2013-11-10 ENCOUNTER — Ambulatory Visit: Payer: Federal, State, Local not specified - PPO | Admitting: Family Medicine

## 2013-11-10 ENCOUNTER — Emergency Department: Payer: Self-pay | Admitting: Emergency Medicine

## 2013-11-10 DIAGNOSIS — J9600 Acute respiratory failure, unspecified whether with hypoxia or hypercapnia: Secondary | ICD-10-CM | POA: Diagnosis present

## 2013-11-10 DIAGNOSIS — G43109 Migraine with aura, not intractable, without status migrainosus: Secondary | ICD-10-CM | POA: Diagnosis present

## 2013-11-10 DIAGNOSIS — G9340 Encephalopathy, unspecified: Secondary | ICD-10-CM | POA: Diagnosis present

## 2013-11-10 DIAGNOSIS — M352 Behcet's disease: Secondary | ICD-10-CM | POA: Diagnosis present

## 2013-11-10 DIAGNOSIS — G40401 Other generalized epilepsy and epileptic syndromes, not intractable, with status epilepticus: Principal | ICD-10-CM | POA: Diagnosis present

## 2013-11-10 DIAGNOSIS — E785 Hyperlipidemia, unspecified: Secondary | ICD-10-CM | POA: Diagnosis present

## 2013-11-10 DIAGNOSIS — Z981 Arthrodesis status: Secondary | ICD-10-CM

## 2013-11-10 DIAGNOSIS — Z79899 Other long term (current) drug therapy: Secondary | ICD-10-CM

## 2013-11-10 DIAGNOSIS — I959 Hypotension, unspecified: Secondary | ICD-10-CM | POA: Diagnosis present

## 2013-11-10 DIAGNOSIS — R569 Unspecified convulsions: Secondary | ICD-10-CM

## 2013-11-10 DIAGNOSIS — K219 Gastro-esophageal reflux disease without esophagitis: Secondary | ICD-10-CM | POA: Diagnosis present

## 2013-11-10 DIAGNOSIS — G40901 Epilepsy, unspecified, not intractable, with status epilepticus: Secondary | ICD-10-CM

## 2013-11-10 DIAGNOSIS — F411 Generalized anxiety disorder: Secondary | ICD-10-CM | POA: Diagnosis present

## 2013-11-10 DIAGNOSIS — Z0289 Encounter for other administrative examinations: Secondary | ICD-10-CM

## 2013-11-10 LAB — POCT I-STAT 3, VENOUS BLOOD GAS (G3P V)
Acid-base deficit: 1 mmol/L (ref 0.0–2.0)
Bicarbonate: 24 mEq/L (ref 20.0–24.0)
O2 Saturation: 79 %
Patient temperature: 98.9
pCO2, Ven: 42.2 mmHg — ABNORMAL LOW (ref 45.0–50.0)

## 2013-11-10 LAB — COMPREHENSIVE METABOLIC PANEL
AST: 22 U/L (ref 0–37)
Albumin: 3.7 g/dL (ref 3.4–5.0)
Albumin: 3.5 g/dL (ref 3.5–5.2)
Alkaline Phosphatase: 96 U/L
BUN: 17 mg/dL (ref 7–18)
Calcium: 8.7 mg/dL (ref 8.4–10.5)
Chloride: 108 mmol/L — ABNORMAL HIGH (ref 98–107)
Co2: 27 mmol/L (ref 21–32)
Creatinine, Ser: 0.6 mg/dL (ref 0.50–1.10)
Creatinine: 0.84 mg/dL (ref 0.60–1.30)
EGFR (Non-African Amer.): >60
GFR calc Af Amer: >90 mL/min (ref >90)
Glucose: 136 mg/dL — ABNORMAL HIGH (ref 65–99)
Osmolality: 279 (ref 275–301)
Potassium: 4 mmol/L (ref 3.5–5.1)
SGOT(AST): 33 U/L (ref 15–37)
Sodium: 138 mmol/L (ref 136–145)
Total Protein: 7.5 g/dL (ref 6.4–8.2)
Total Protein: 6.4 g/dL (ref 6.0–8.3)

## 2013-11-10 LAB — CBC WITH DIFFERENTIAL/PLATELET
Basophils Absolute: 0 10*3/uL (ref 0.0–0.1)
Eosinophils Absolute: 0.1 10*3/uL (ref 0.0–0.7)
Eosinophils Relative: 2 % (ref 0–5)
Hemoglobin: 11.2 g/dL — ABNORMAL LOW (ref 12.0–15.0)
Lymphocytes Relative: 34 % (ref 12–46)
Lymphs Abs: 2.3 10*3/uL (ref 0.7–4.0)
MCH: 31.5 pg (ref 26.0–34.0)
MCV: 90.4 fL (ref 78.0–100.0)
Monocytes Relative: 7 % (ref 3–12)
Neutro Abs: 4 10*3/uL (ref 1.7–7.7)
Neutrophils Relative %: 58 % (ref 43–77)
Platelets: 210 10*3/uL (ref 150–400)
RBC: 3.55 MIL/uL — ABNORMAL LOW (ref 3.87–5.11)

## 2013-11-10 LAB — CBC
HCT: 35.3 % (ref 35.0–47.0)
HGB: 12.1 g/dL (ref 12.0–16.0)
MCHC: 34.2 g/dL (ref 32.0–36.0)
Platelet: 208 10*3/uL (ref 150–440)
RBC: 3.88 10*6/uL (ref 3.80–5.20)
RDW: 12.7 % (ref 11.5–14.5)
WBC: 8.2 10*3/uL (ref 3.6–11.0)

## 2013-11-10 LAB — POCT I-STAT 3, ART BLOOD GAS (G3+)
Acid-base deficit: 2 mmol/L (ref 0.0–2.0)
O2 Saturation: 100 %
Patient temperature: 98.6
pCO2 arterial: 41.4 mmHg (ref 35.0–45.0)

## 2013-11-10 LAB — DRUG SCREEN, URINE
MDMA (Ecstasy)Ur Screen: NEGATIVE (ref ?–500)
Methadone, Ur Screen: NEGATIVE (ref ?–300)
Phencyclidine (PCP) Ur S: NEGATIVE (ref ?–25)
Tricyclic, Ur Screen: NEGATIVE (ref ?–1000)

## 2013-11-10 LAB — PROTIME-INR: Prothrombin Time: 13.2 seconds (ref 11.6–15.2)

## 2013-11-10 LAB — PHOSPHORUS: Phosphorus: 3.3 mg/dL (ref 2.3–4.6)

## 2013-11-10 LAB — ETHANOL: Ethanol %: <0.003 % (ref 0.000–0.080)

## 2013-11-10 LAB — URINALYSIS, COMPLETE
Bilirubin,UR: NEGATIVE
Ketone: NEGATIVE
Nitrite: NEGATIVE
Ph: 5 (ref 4.5–8.0)
Protein: NEGATIVE
RBC,UR: 1 /HPF (ref 0–5)
Specific Gravity: 1.026 (ref 1.003–1.030)
WBC UR: <1 /HPF (ref 0–5)

## 2013-11-10 LAB — MAGNESIUM: Magnesium: 1.9 mg/dL (ref 1.5–2.5)

## 2013-11-10 MED ORDER — PANTOPRAZOLE SODIUM 40 MG IV SOLR
40.0000 mg | INTRAVENOUS | Status: DC
  Administered 2013-11-11 – 2013-11-12 (×2): 40 mg via INTRAVENOUS
  Filled 2013-11-10 (×4): qty 40

## 2013-11-10 MED ORDER — MIDAZOLAM HCL 2 MG/2ML IJ SOLN
INTRAMUSCULAR | Status: AC
  Administered 2013-11-10: 4 mg
  Filled 2013-11-10: qty 4

## 2013-11-10 MED ORDER — SODIUM CHLORIDE 0.9 % IV BOLUS (SEPSIS)
500.0000 mL | Freq: Once | INTRAVENOUS | Status: AC
  Administered 2013-11-10: 500 mL via INTRAVENOUS

## 2013-11-10 MED ORDER — VALPROATE SODIUM 500 MG/5ML IV SOLN
750.0000 mg | Freq: Three times a day (TID) | INTRAVENOUS | Status: DC
  Administered 2013-11-11 – 2013-11-14 (×11): 750 mg via INTRAVENOUS
  Filled 2013-11-10 (×12): qty 7.5

## 2013-11-10 MED ORDER — FENTANYL CITRATE 0.05 MG/ML IJ SOLN
25.0000 ug | INTRAMUSCULAR | Status: DC | PRN
  Administered 2013-11-10 – 2013-11-11 (×4): 100 ug via INTRAVENOUS
  Filled 2013-11-10 (×7): qty 2

## 2013-11-10 MED ORDER — BIOTENE DRY MOUTH MT LIQD
1.0000 "application " | Freq: Four times a day (QID) | OROMUCOSAL | Status: DC
  Administered 2013-11-11 – 2013-11-12 (×6): 15 mL via OROMUCOSAL

## 2013-11-10 MED ORDER — MIDAZOLAM HCL 5 MG/ML IJ SOLN: 2.0000 mg | INTRAMUSCULAR | Status: DC | PRN

## 2013-11-10 MED ORDER — GABAPENTIN 300 MG PO CAPS
600.0000 mg | ORAL_CAPSULE | Freq: Three times a day (TID) | ORAL | Status: DC
  Administered 2013-11-11 – 2013-11-15 (×13): 600 mg via ORAL
  Filled 2013-11-10 (×16): qty 2

## 2013-11-10 MED ORDER — SODIUM CHLORIDE 0.9 % IV SOLN
1000.0000 mg | Freq: Two times a day (BID) | INTRAVENOUS | Status: DC
  Administered 2013-11-10 – 2013-11-13 (×7): 1000 mg via INTRAVENOUS
  Filled 2013-11-10 (×10): qty 10

## 2013-11-10 MED ORDER — SODIUM CHLORIDE 0.9 % IV SOLN
0.0000 mg/h | INTRAVENOUS | Status: DC
  Administered 2013-11-10: 8 mg/h via INTRAVENOUS
  Administered 2013-11-10 – 2013-11-11 (×2): 10 mg/h via INTRAVENOUS
  Filled 2013-11-10 (×4): qty 10

## 2013-11-10 MED ORDER — PROPOFOL 10 MG/ML IV EMUL: 5.0000 ug/kg/min | INTRAVENOUS | Status: DC

## 2013-11-10 MED ORDER — MIDAZOLAM BOLUS VIA INFUSION
1.0000 mg | INTRAVENOUS | Status: DC | PRN
  Filled 2013-11-10: qty 2

## 2013-11-10 MED ORDER — SODIUM CHLORIDE 0.9 % IV SOLN
INTRAVENOUS | Status: DC
  Administered 2013-11-13: 75 mL/h via INTRAVENOUS

## 2013-11-10 MED ORDER — MIDAZOLAM HCL 5 MG/ML IJ SOLN: 1.0000 mg | INTRAMUSCULAR | Status: DC | PRN

## 2013-11-10 MED ORDER — CHLORHEXIDINE GLUCONATE 0.12 % MT SOLN
15.0000 mL | Freq: Two times a day (BID) | OROMUCOSAL | Status: DC
  Administered 2013-11-10 – 2013-11-11 (×3): 15 mL via OROMUCOSAL
  Filled 2013-11-10 (×3): qty 15

## 2013-11-10 MED ORDER — FENTANYL BOLUS VIA INFUSION
100.0000 ug | Freq: Once | INTRAVENOUS | Status: DC
  Filled 2013-11-10: qty 100

## 2013-11-10 NOTE — H&P (Signed)
PULMONARY  / CRITICAL CARE MEDICINE  Name: Brenda Mcguire MRN: [id number] DOB: 1961/04/26    ADMISSION DATE:  11/10/2013 CONSULTATION DATE:  11/10/2013  REFERRING MD :  EDP Andrews PRIMARY SERVICE:  PCCM  CHIEF COMPLAINT:  Status epilepticus  BRIEF PATIENT DESCRIPTION: 52 yo with past medical history of seizures ( pseudoseizures? ) brought to [facility] ED with seizure activity.  In ED repeated seizures in spite of treatment with anticonvulsives.  Intubated for airway protection.  PCCM was consulted to accept the patient in transfer.  SIGNIFICANT EVENTS / STUDIES:  11/28  Status epilepticus.  Intubated.  Transferred to [facility]  LINES / TUBES: OETT 11/28 >>> OGT 11/28 >>> Foley 11/28 >>> L chest Port ??? >>>  CULTURES:  ANTIBIOTICS:  The patient is encephalopathic and unable to provide history, which was obtained for available medical records.  HISTORY OF PRESENT ILLNESS:  52 yo with past medical history of seizures ( pseudoseizures? ) brought to [facility] ED with seizure activity.  In ED repeated seizures in spite of treatment with anticonvulsives.  Intubated for airway protection.  PCCM was consulted to accept the patient in transfer.  PAST MEDICAL HISTORY :  Past Medical History  Diagnosis Date  . GERD (gastroesophageal reflux disease)   . Hyperlipidemia   . History of blood transfusion   . Seizures   . Chronic headaches   . Phlebitis   . History of colonic polyps   . Anxiety   . Behcet's disease    Past Surgical History  Procedure Laterality Date  . Cholecystectomy    . Tonsillectomy  1970  . Appendectomy  1975  . Abdominal hysterectomy  1988  . Lumbar fusion  1999    L5-S1   Prior to Admission medications   Medication Sig Start Date End Date Taking? Authorizing Provider  ALPRAZolam (XANAX) 0.5 MG tablet TAKE 1 TABLET BY MOUTH 3 TIMES A DAY AS NEEDED FOR SLEEP 11/07/13   Nicki Reaper, NP  divalproex (DEPAKOTE) 250 MG DR tablet Take 3 tablets (750 mg total) by mouth  3 (three) times daily. 11/01/13   Nicki Reaper, NP  gabapentin (NEURONTIN) 300 MG capsule Take 2 capsules (600 mg total) by mouth 3 (three) times daily. 11/01/13   Nicki Reaper, NP  glucagon 1 MG injection Inject 1 mg into the vein once as needed. 08/10/13   Nicki Reaper, NP  HYDROcodone-acetaminophen (NORCO/VICODIN) 5-325 MG per tablet Take 1 tablet by mouth every 12 (twelve) hours. 10/31/13   Nicki Reaper, NP  hydroxychloroquine (PLAQUENIL) 200 MG tablet Take 1 tablet (200 mg total) by mouth 2 (two) times daily. 03/02/13   Nicki Reaper, NP  levETIRAcetam (KEPPRA) 500 MG tablet Take 1 tablet (500 mg total) by mouth 2 (two) times daily. 03/02/13   Nicki Reaper, NP  mirtazapine (REMERON SOL-TAB) 15 MG disintegrating tablet Take 1 tablet (15 mg total) by mouth at bedtime. 11/01/13   Nicki Reaper, NP  ondansetron (ZOFRAN ODT) 4 MG disintegrating tablet 4mg  ODT q4 hours prn nausea/vomit 11/01/13   Nicki Reaper, NP  promethazine (PHENERGAN) 12.5 MG tablet TAKE 1 TABLET (12.5 MG TOTAL) BY MOUTH EVERY 6 (SIX) HOURS AS NEEDED FOR NAUSEA. 11/01/13   Nicki Reaper, NP  zolpidem (AMBIEN CR) 12.5 MG CR tablet TAKE 1 TABLET BY MOUTH AT BEDTIME AS NEEDED FOR SLEEP 11/01/13   Nicki Reaper, NP   Allergies  Allergen Reactions  . Iohexol Anaphylaxis, Hives and Shortness Of Breath  . Codeine Itching  . Hepatitis B Vaccine   .  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 Other     CT Contrast   FAMILY HISTORY:  Family History  Problem Relation Age of Onset  . Diabetes Mother   . Hyperlipidemia Mother   . Stroke Mother   . Hypertension Mother   . Colon cancer Mother   . Breast cancer Mother   . Early death Father   . Heart attack Father   . Heart disease Father   . Hyperlipidemia Father   . COPD Sister    SOCIAL HISTORY:  reports that she has never smoked. She has never used smokeless tobacco. She reports that she does not drink alcohol or use illicit drugs.  REVIEW OF SYSTEMS:  Unable to  provide.  INTERVAL HISTORY:  VITAL SIGNS:   HEMODYNAMICS:   VENTILATOR SETTINGS:   INTAKE / OUTPUT: Intake/Output   None    PHYSICAL EXAMINATION: General:  Appears acutely ill, mechanically ventilated, synchronous Neuro:  Encephalopathic, nonfocal, cough / gag diminished HEENT:  PERRL, OETT / OGT Cardiovascular:  RRR, no m/r/g Lungs:  Bilateral diminished air entry, no w/r/r Abdomen:  Soft, nontender, bowel sounds diminished Musculoskeletal:  Moves all extremities, no edema Skin:  Intact  LABS:  CBC No results found for this basename: WBC, HGB, HCT, PLT,  in the last 168 hours Coag's No results found for this basename: APTT, INR,  in the last 168 hours BMET No results found for this basename: NA, K, CL, CO2, BUN, CREATININE, GLUCOSE,  in the last 168 hours Electrolytes No results found for this basename: CALCIUM, MG, PHOS,  in the last 168 hours Sepsis Markers No results found for this basename: LATICACIDVEN, PROCALCITON, O2SATVEN,  in the last 168 hours ABG No results found for this basename: PHART, PCO2ART, PO2ART,  in the last 168 hours Liver Enzymes No results found for this basename: AST, ALT, ALKPHOS, BILITOT, ALBUMIN,  in the last 168 hours Cardiac Enzymes No results found for this basename: TROPONINI, PROBNP,  in the last 168 hours Glucose No results found for this basename: GLUCAP,  in the last 168 hours  CXR:  11/28 >>>   ASSESSMENT / PLAN:  PULMONARY A:  Acute respiratory failure in setting of status epilepticus. P:   Gaol SpO2>92, pH>7.30 Full mechanical support Daily SBT Ventilator bundle Trend ABG / CXR  CARDIOVASCULAR A: Hemodynamically stable.  No arrhythmia / ischemia. P:  Goal MAP>60  RENAL A:  No active issues. P:   Goal CVP>10 Trend BMP NS@100   GASTROINTESTINAL A:  No active issues. P:   NPO as intubated Protonix for GI Px  HEMATOLOGIC A:  No active issues. P:  Trend CBC SCDs for DVT Px APTT / INR  INFECTIOUS A:   No active issues. P:   Defer cultures and antibiotics  ENDOCRINE  A:  No active issues.   P:   CBG to assure normoglycemia  NEUROLOGIC A:  Suspected status epilepticus.  Acute encephalopathy.  P:   Goal RASS 0 to -1 Head CT Drug / alcohol screen Propofol gtt Continuous EEG Neurology consulted Hold Xanax, Depakote, Neurontin, Hydrocodone, Keppra, Remeron, Ambien Anticonvulsive regimen per Neurology  I have personally obtained history, examined patient, evaluated and interpreted laboratory and imaging results, reviewed medical records, formulated assessment / plan and placed orders.  CRITICAL CARE:  The patient is critically ill with multiple organ systems failure and requires high complexity decision making for assessment and support, frequent evaluation and titration of therapies, application of advanced monitoring technologies  and extensive interpretation of multiple databases. Critical Care Time devoted to patient care services described in this note is 40 minutes.   Lonia Farber, MD Pulmonary and Critical Care Medicine [facility] Pager: [phone]  11/10/2013, 5:46 PM

## 2013-11-10 NOTE — Progress Notes (Signed)
eLink Physician-Brief Progress Note Patient Name: Zawadi Aplin DOB: [birth year] MRN: [id number]  Date of Service  11/10/2013   HPI/Events of Note   Agitated on PRN Versed/Fentanyl  eICU Interventions  Changed Versed to Drip      Jacquel Mccamish R. 11/10/2013, 8:29 PM

## 2013-11-10 NOTE — Consult Note (Signed)
Neurology Consultation Reason for Consult: Status Epilepticus Referring Physician: Zubelevitskiy, K  CC: Seizures  History is obtained from:Referring MD, medical record.   HPI: Brenda Mcguire is a 52 y.o. female with a history of bechets disease as well as history(though some ? Of pseudoseizures in the past) who is on multiple antiepileptic medications and presented to an outside ER in status epilepticus tonight. The ER physician described convincing flexion of bilateral extremities with rhythmic jerking. She continued to have this activity despite Ativan and Keppra load and therefore she was intubated received more Ativan, Cerebyx load, and was transferred to [facility] for further care.  On arrival, despite being intubated and on propofol, she has had 2 episodes of bilateral arm flexion concerning for seizure.    ROS: Unable to assess secondary to patient's altered mental status.    Past Medical History  Diagnosis Date  . GERD (gastroesophageal reflux disease)   . Hyperlipidemia   . History of blood transfusion   . Seizures   . Chronic headaches   . Phlebitis   . History of colonic polyps   . Anxiety   . Behcet's disease     Family History: Unable to assess secondary to patient's altered mental status.    Social History: Tob: Unable to assess secondary to patient's altered mental status.    Exam: Current vital signs: Wt 81 kg (178 lb 9.2 oz) Vital signs in last 24 hours: Weight:  [81 kg (178 lb 9.2 oz)] 81 kg (178 lb 9.2 oz) (11/28 1700)  General: in bed, intubated CV: RRR Mental Status: Patient doe not open eyes to noxious stimuli.  Cranial Nerves: II: Does not blink to threat. Pupils are equal, round, and reactive to light.  Discs are difficult to visualize. III,IV, VI: Doll's eye intact V/VII: Corneals intact  Unable to assess secondary to patient's altered mental status.  Motor/sensory: Does not withdraw to noxious stimulation in any extremity Deep Tendon  Reflexes: 2+ and symmetric in the biceps and patellae.  Cerebellar: Unable to assess secondary to patient's altered mental status.  Gait: Unable to assess secondary to patient's altered mental status.     I have reviewed labs in epic and the results pertinent to this consultation are: CMP unremarkable CBC-unremarkable  I have reviewed the images obtained: CT head-no acute findings  Impression: 52 year old female with a history of Behcet's disease as well as seizure disorder who presented in status epilepticus. By EEG, this appears to have resolved.   Recommendations: 1) already loaded with Cerebyx and Keppra 2) prefer to not use Cerebyx with Depakote and therefore will continue Depakote at home dose of 750 mg 3 times a day 3) increase Keppra to 1000 mg twice a day(home dose 500 mg twice a day) 4) continue gabapentin 600 3 times a day 5) I will continue continuous monitoring at this time, initial review does not reveal ongoing seizure activity.   [name], MD [facility] [phone]  If 7pm- 7am, please page neurology on call at [phone].

## 2013-11-10 NOTE — Progress Notes (Signed)
Name: Pearlee Arvizu MRN: [id number] DOB: July 03, 1961  ELECTRONIC ICU PHYSICIAN NOTE  Problem:  No longer needing propofol  Intervention:  Change to versed / fentanyl bolus if tolerates   Sandrea Hughs 11/10/2013, 8:01 PM

## 2013-11-10 NOTE — Progress Notes (Signed)
Continuous EEG started.

## 2013-11-10 NOTE — Procedures (Signed)
Central Venous Catheter Insertion Procedure Note Brenda Mcguire [id number] 1961/03/16  Procedure: Insertion of Central Venous Catheter Indications: Drug and/or fluid administration and Frequent blood sampling  Procedure Details Consent: Risks of procedure as well as the alternatives and risks of each were explained to the (patient/caregiver).  Consent for procedure obtained. Time Out: Verified patient identification, verified procedure, site/side was marked, verified correct patient position, special equipment/implants available, medications/allergies/relevent history reviewed, required imaging and test results available.  Performed  Maximum sterile technique was used including antiseptics, cap, gloves, gown, hand hygiene, mask and sheet. Skin prep: Iodine solution; local anesthetic administered A antimicrobial bonded/coated triple lumen catheter was placed in the right internal jugular vein using the Seldinger technique.  Evaluation Blood flow good Complications: No apparent complications Patient did tolerate procedure well. Chest X-ray ordered to verify placement.  CXR: pending.  Brenda Mcguire 11/10/2013, 11:46 PM

## 2013-11-10 NOTE — Progress Notes (Signed)
Name: Brenda Mcguire MRN: [id number] DOB: Feb 06, 1961  ELECTRONIC ICU PHYSICIAN NOTE  Problem:  Hypotension after titration of propofol for sz (resolved)     Intervention:  NS bolus 500 cc  Sandrea Hughs 11/10/2013, 7:09 PM

## 2013-11-11 DIAGNOSIS — K219 Gastro-esophageal reflux disease without esophagitis: Secondary | ICD-10-CM

## 2013-11-11 LAB — POCT I-STAT 3, ART BLOOD GAS (G3+)
Acid-base deficit: 1 mmol/L (ref 0.0–2.0)
O2 Saturation: 98 %
Patient temperature: 98.4
TCO2: 26 mmol/L (ref 0–100)
pCO2 arterial: 42.1 mmHg (ref 35.0–45.0)
pH, Arterial: 7.369 (ref 7.350–7.450)

## 2013-11-11 LAB — VALPROIC ACID LEVEL: Valproic Acid Lvl: 56.8 ug/mL (ref 50.0–100.0)

## 2013-11-11 LAB — GLUCOSE, CAPILLARY
Glucose-Capillary: 100 mg/dL — ABNORMAL HIGH (ref 70–99)
Glucose-Capillary: 80 mg/dL (ref 70–99)

## 2013-11-11 LAB — PROLACTIN: Prolactin: 11.9 ng/mL

## 2013-11-11 MED ORDER — ONDANSETRON HCL 4 MG/2ML IJ SOLN
4.0000 mg | Freq: Four times a day (QID) | INTRAMUSCULAR | Status: DC | PRN
  Administered 2013-11-11 – 2013-11-15 (×7): 4 mg via INTRAVENOUS
  Filled 2013-11-11 (×7): qty 2

## 2013-11-11 MED ORDER — KETOROLAC TROMETHAMINE 30 MG/ML IJ SOLN
30.0000 mg | Freq: Once | INTRAMUSCULAR | Status: AC
  Administered 2013-11-11: 30 mg via INTRAVENOUS
  Filled 2013-11-11: qty 1

## 2013-11-11 MED ORDER — ONDANSETRON HCL 4 MG/2ML IJ SOLN: 4.0000 mg | Freq: Four times a day (QID) | INTRAMUSCULAR | Status: DC

## 2013-11-11 MED ORDER — ONDANSETRON HCL 4 MG/2ML IJ SOLN
INTRAMUSCULAR | Status: AC
  Administered 2013-11-11: 4 mg
  Filled 2013-11-11: qty 2

## 2013-11-11 MED ORDER — FENTANYL CITRATE 0.05 MG/ML IJ SOLN
25.0000 ug | INTRAMUSCULAR | Status: DC | PRN
  Administered 2013-11-11: 50 ug via INTRAVENOUS
  Administered 2013-11-11: 100 ug via INTRAVENOUS
  Administered 2013-11-11 (×2): 25 ug via INTRAVENOUS
  Administered 2013-11-11: 100 ug via INTRAVENOUS
  Administered 2013-11-11 (×3): 25 ug via INTRAVENOUS
  Administered 2013-11-11: 100 ug via INTRAVENOUS
  Administered 2013-11-12 (×2): 25 ug via INTRAVENOUS
  Administered 2013-11-12 (×4): 50 ug via INTRAVENOUS
  Administered 2013-11-12 (×2): 25 ug via INTRAVENOUS
  Administered 2013-11-12 (×3): 50 ug via INTRAVENOUS
  Administered 2013-11-13: 25 ug via INTRAVENOUS
  Filled 2013-11-11 (×13): qty 2

## 2013-11-11 MED ORDER — RACEPINEPHRINE HCL 2.25 % IN NEBU
INHALATION_SOLUTION | RESPIRATORY_TRACT | Status: AC
  Filled 2013-11-11: qty 0.5

## 2013-11-11 NOTE — Progress Notes (Signed)
PULMONARY  / CRITICAL CARE MEDICINE  Name: Brenda Mcguire MRN: [id number] DOB: Jul 03, 1961    ADMISSION DATE:  11/10/2013 CONSULTATION DATE:  11/10/2013  REFERRING MD :  EDP [place] PRIMARY SERVICE:  PCCM  CHIEF COMPLAINT:  Status epilepticus  BRIEF PATIENT DESCRIPTION: 52 y/o with past medical history of seizures (pseudoseizures?) brought to [facility] ED with seizure activity.  In ED repeated seizures in spite of treatment with anticonvulsives.  Intubated for airway protection.  PCCM was consulted to accept the patient in transfer.  SIGNIFICANT EVENTS / STUDIES:  11/28 - Status epilepticus.  Intubated.  Transferred to [facility]. 11/29 - EEG without definitive seizure activity.    LINES / TUBES: OETT 11/28 >>> OGT 11/28 >>> Foley 11/28 >>> L chest Port ??? >>>  CULTURES:  ANTIBIOTICS:   INTERVAL HISTORY:  VITAL SIGNS: Temp:  [97.8 F (36.6 C)-100 F (37.8 C)] 100 F (37.8 C) (11/29 0736) Pulse Rate:  [60-102] 78 (11/29 0645) Resp:  [12-27] 16 (11/29 0645) BP: (78-119)/(42-101) 102/58 mmHg (11/29 0645) SpO2:  [95 %-100 %] 99 % (11/29 0645) FiO2 (%):  [30 %-50 %] 30 % (11/29 0800) Weight:  [178 lb 9.2 oz (81 kg)-192 lb 14.4 oz (87.5 kg)] 192 lb 14.4 oz (87.5 kg) (11/29 0500)   VENTILATOR SETTINGS: Vent Mode:  [-] PRVC FiO2 (%):  [30 %-50 %] 30 % Set Rate:  [12 bmp-16 bmp] 16 bmp Vt Set:  [370 mL-500 mL] 370 mL PEEP:  [5 cmH20] 5 cmH20 Plateau Pressure:  [14 cmH20-15 cmH20] 15 cmH20 INTAKE / OUTPUT: Intake/Output     11/28 0701 - 11/29 0700 11/29 0701 - 11/30 0700   I.V. (mL/kg) 1570.1 (17.9) 110 (1.3)   NG/GT 120    IV Piggyback 717.5 100   Total Intake(mL/kg) 2407.6 (27.5) 210 (2.4)   Urine (mL/kg/hr) 1005    Emesis/NG output 550    Total Output 1555     Net +852.6 +210         PHYSICAL EXAMINATION: General:  Appears acutely ill, mechanically ventilated, synchronous Neuro:  Encephalopathic, nonfocal, opens eyes / follows commands HEENT:  PERRL, OETT /  OGT Cardiovascular:  RRR, no m/r/g Lungs:  Bilateral diminished air entry, no w/r/r Abdomen:  Soft, nontender, bowel sounds diminished Musculoskeletal:  Moves all extremities, no edema Skin:  Intact  LABS:  CBC  Recent Labs Lab 11/10/13 1748  WBC 6.8  HGB 11.2*  HCT 32.1*  PLT 210   Coag's  Recent Labs Lab 11/10/13 1748  APTT 28  INR 1.02   BMET  Recent Labs Lab 11/10/13 1748  NA 138  K 3.5  CL 106  CO2 24  BUN 14  CREATININE 0.60  GLUCOSE 100*   Electrolytes  Recent Labs Lab 11/10/13 1748  CALCIUM 8.7  MG 1.9  PHOS 3.3   ABG  Recent Labs Lab 11/10/13 1957 11/11/13 0422  PHART 7.357 7.369  PCO2ART 41.4 42.1  PO2ART 181.0* 109.0*   Liver Enzymes  Recent Labs Lab 11/10/13 1748  AST 22  ALT 32  ALKPHOS 78  BILITOT 0.3  ALBUMIN 3.5   Glucose  Recent Labs Lab 11/10/13 1958 11/11/13 0124 11/11/13 0509 11/11/13 0734  GLUCAP 94 100* 87 93    CXR:  11/29>>>tubes / lines in good position, LLL atx  ASSESSMENT / PLAN:  PULMONARY A:   Acute respiratory failure in setting of status epilepticus. P:   Gaol SpO2>92, pH>7.30 Full mechanical support with daily SBT, attempt to extubate 11/29 Ventilator bundle Trend ABG /  CXR  CARDIOVASCULAR A:  Hemodynamically stable.   No arrhythmia / ischemia. P:  Goal MAP>60  RENAL A:   No active issues. P:   Trend BMP NS to 50 ml/hr  GASTROINTESTINAL A:   No active issues. P:   NPO as intubated Protonix for GI Px  HEMATOLOGIC A:   No active issues. P:  Trend CBC SCDs for DVT Px APTT / INR  INFECTIOUS A:   No active issues. P:   Defer cultures and antibiotics  ENDOCRINE  A:   No active issues.   P:   CBG to assure normoglycemia  NEUROLOGIC A:   Suspected status epilepticus vs pseudoseizure (leaning toward pseudoseizure / psychogenic seizures).   Acute encephalopathy.  P:   Goal RASS 0  Head CT Drug / alcohol screen Continuous EEG Neurology consulted Hold  Xanax, Depakote, Neurontin, Hydrocodone, Keppra, Remeron, Ambien Anticonvulsive regimen per Neurology Limit sedating medications  Canary Brim, NP-C [facility] Pgr: [phone] or [phone]  I have personally obtained history, examined patient, evaluated and interpreted laboratory and imaging results, reviewed medical records, formulated assessment / plan and placed orders.  CRITICAL CARE:  The patient is critically ill with multiple organ systems failure and requires high complexity decision making for assessment and support, frequent evaluation and titration of therapies, application of advanced monitoring technologies and extensive interpretation of multiple databases. Critical Care Time devoted to patient care services described in this note is 35 minutes.   11/11/2013, 8:48 AM  Alyson Reedy, M.D. [facility] Pulmonary/Critical Care Medicine. Pager: [phone]. After hours pager: [phone].

## 2013-11-11 NOTE — Progress Notes (Signed)
Utilization review completed. Jacqualyn Sedgwick, RN, BSN. 

## 2013-11-11 NOTE — Procedures (Signed)
Extubation Procedure Note  Patient Details:   Name: Brenda Mcguire DOB: 02-Oct-1961 MRN: [id number]   Airway Documentation:     Evaluation  O2 sats: transiently fell during during procedure and currently acceptable Complications: Complications of mild stridor and anxiety post extubation Patient did tolerate procedure well. Bilateral Breath Sounds: Clear, with mild stridor, MD called and assessed patient at the bedside. Suctioning: Oral Yes Good cuff leak prior to extubation  Newt Lukes 11/11/2013, 9:34 AM

## 2013-11-11 NOTE — Progress Notes (Signed)
eLink Physician-Brief Progress Note Patient Name: Brenda Mcguire DOB: 1961-05-06 MRN: [id number]  Date of Service  11/11/2013   HPI/Events of Note   Patient with S/S worrisome to RN for pain. Feels that fentanyl not lasting long enough.  eICU Interventions  Increased PRN frequency.    Intervention Category Intermediate Interventions: Pain - evaluation and management  Illiana Losurdo R. 11/11/2013, 3:37 AM

## 2013-11-11 NOTE — Progress Notes (Signed)
Dr. [name] paged and advised that pt is now "seeing double".  Advised to continue to monitor status.

## 2013-11-11 NOTE — Progress Notes (Signed)
Subjective: Extubated this morning, much more awake. She had a single episode overnight without convincing EEG evidence of seizure, though there was artifact which makes it difficult to exclude this completely.  She was clearly fully awake prior to onset of the episode last night.  She complains of headache  Exam: Filed Vitals:   11/11/13 1216  BP:   Pulse:   Temp: 98.3 F (36.8 C)  Resp:    Gen: In bed, NAD MS: Awake, alert, oriented to person, place, year. CN: Pupils equal round and reactive, extraocular movements intact, visual fields full Motor: gives equal strength on both sides Sensory: Endorses sensation in all 4 extremities  Impression: 52 year old female presenting with status epilepticus versus pseudo-status. At this time I am hesitant to call her pseudoseizure based on a single captured event given the severity of her presentations. I would favor keeping her on monitoring but to hopefully capture more spells.  Recommendations: 1) continue gabapentin 600 3 times a day 2) continue Keppra 1000 mg twice a day (increased from home dose) 3) continue Depakote 750 3 times a day 4) continue EEG monitoring 5) Toradol 30 mg x1 for headache.  [name], MD [facility] [phone]  If 7pm- 7am, please page neurology on call at [phone].

## 2013-11-11 NOTE — Procedures (Signed)
History: 52 yo F with a history of seizures vs pseudoseizures who presented in status epilepticus.   Background: The EEG was performed as a stat to rule out status epilepticus. On connection, the background consists of irregular delta activity with superimposed runs of rhythmic alpha and beta resembling sleep structures. There is excessive beta activity throughout the recording.   She had a single event recorded. She intiallly pats her stomach with her right hand and subsequently has low amplitude jerking movements, predominantly in her head back and forth. There is some rhythmic delta predominant at C3 that is present during the event, and there is beta throughout, but I suspect the delta is due to movement artifact given that her head is lying on the left side, and the beta activity does not appear significantly different from surrounding periods of the EEG.   She has a second episode at 5:41 am: Preceding the event, there is a wakeful rhythm with a PDR visible. The nurse is in the room with the patient at the time. She has bilateral arm flexion and shaking. Her head is turned to the left. The EEG has left sided predominant delta which again I feel could be solely due to the movement artifact. There is no clear change from the pre-ictal EEG otherwise.   Photic stimulation: Physiologic driving is absent  EEG Abnormalities: 1) Two clinical events as described above. Both are associated with left sided rhythmic delta activity in the setting of a background with excessive beta activity.   Clinical Interpretation: This EEG recorded two events of clinical concern consisting of low amplitude shaking, the second with bilateral flexion and initially right arm shaking. The artifact and excessive beta activity surrounding these events could obscure a subtle electrographic discharge, though no clear ictal discharge was seen. I cannot exclude seizure based on this study but psychogenic spells are very much called  into question.   [name], MD [facility] [phone]  If 7pm- 7am, please page neurology on call at [phone].

## 2013-11-11 NOTE — Progress Notes (Signed)
eLink Physician-Brief Progress Note Patient Name: Brenda Mcguire DOB: 1961-04-09 MRN: [id number]  Date of Service  11/11/2013   HPI/Events of Note   Supranormal O2 noted.  eICU Interventions  Decreased FiO2, changed patient to 6 ml/kg   Intervention Category Intermediate Interventions: Respiratory distress - evaluation and management  Avree Szczygiel R. 11/11/2013, 5:10 AM

## 2013-11-11 NOTE — Progress Notes (Signed)
Continuous LTM day 2  

## 2013-11-11 NOTE — Progress Notes (Signed)
Respiratory therapy note- will attempt wean and extubation as appropriate.

## 2013-11-11 NOTE — Progress Notes (Signed)
Extubated pt, wheezing noted. Dr. Molli Knock to bedside, pt sats 100%. Pt anxious, md advised continue to monitor closely. Pt currently maintaining oxygen saturation of 100% on 2L nasal cannula.  Will continue to monitor her improving condition.

## 2013-11-12 ENCOUNTER — Inpatient Hospital Stay (HOSPITAL_COMMUNITY): Payer: Federal, State, Local not specified - PPO

## 2013-11-12 ENCOUNTER — Encounter (HOSPITAL_COMMUNITY): Payer: Self-pay | Admitting: Pulmonary Disease

## 2013-11-12 DIAGNOSIS — I959 Hypotension, unspecified: Secondary | ICD-10-CM

## 2013-11-12 LAB — BASIC METABOLIC PANEL
BUN: 10 mg/dL (ref 6–23)
CO2: 26 mEq/L (ref 19–32)
Chloride: 107 mEq/L (ref 96–112)
GFR calc Af Amer: >90 mL/min (ref >90)
Glucose, Bld: 77 mg/dL (ref 70–99)
Potassium: 3.7 mEq/L (ref 3.5–5.1)
Sodium: 142 mEq/L (ref 135–145)

## 2013-11-12 LAB — GLUCOSE, CAPILLARY
Glucose-Capillary: 109 mg/dL — ABNORMAL HIGH (ref 70–99)
Glucose-Capillary: 71 mg/dL (ref 70–99)
Glucose-Capillary: 79 mg/dL (ref 70–99)
Glucose-Capillary: 87 mg/dL (ref 70–99)
Glucose-Capillary: 99 mg/dL (ref 70–99)

## 2013-11-12 LAB — CBC
HCT: 29.4 % — ABNORMAL LOW (ref 36.0–46.0)
Hemoglobin: 10.2 g/dL — ABNORMAL LOW (ref 12.0–15.0)
MCHC: 34.7 g/dL (ref 30.0–36.0)
Platelets: 172 10*3/uL (ref 150–400)

## 2013-11-12 MED ORDER — SODIUM CHLORIDE 0.9 % IV BOLUS (SEPSIS)
1000.0000 mL | Freq: Once | INTRAVENOUS | Status: AC
  Administered 2013-11-12: 1000 mL via INTRAVENOUS

## 2013-11-12 MED ORDER — KETOROLAC TROMETHAMINE 30 MG/ML IJ SOLN
30.0000 mg | Freq: Once | INTRAMUSCULAR | Status: AC
  Administered 2013-11-12: 30 mg via INTRAVENOUS
  Filled 2013-11-12: qty 1

## 2013-11-12 MED ORDER — SODIUM CHLORIDE 0.9 % IV BOLUS (SEPSIS)
500.0000 mL | Freq: Once | INTRAVENOUS | Status: AC
  Administered 2013-11-12: 500 mL via INTRAVENOUS

## 2013-11-12 MED ORDER — PROCHLORPERAZINE EDISYLATE 5 MG/ML IJ SOLN
10.0000 mg | Freq: Four times a day (QID) | INTRAMUSCULAR | Status: DC | PRN
  Administered 2013-11-13 – 2013-11-14 (×4): 10 mg via INTRAVENOUS
  Filled 2013-11-12 (×5): qty 2

## 2013-11-12 MED ORDER — PROCHLORPERAZINE EDISYLATE 5 MG/ML IJ SOLN
10.0000 mg | Freq: Once | INTRAMUSCULAR | Status: AC
  Administered 2013-11-12: 10 mg via INTRAVENOUS
  Filled 2013-11-12: qty 2

## 2013-11-12 MED ORDER — DOPAMINE-DEXTROSE 3.2-5 MG/ML-% IV SOLN
2.0000 ug/kg/min | INTRAVENOUS | Status: DC
  Administered 2013-11-12: 2 ug/kg/min via INTRAVENOUS
  Filled 2013-11-12: qty 250

## 2013-11-12 MED ORDER — METOCLOPRAMIDE HCL 5 MG/ML IJ SOLN
10.0000 mg | Freq: Once | INTRAMUSCULAR | Status: AC
  Administered 2013-11-12: 10 mg via INTRAVENOUS
  Filled 2013-11-12: qty 2

## 2013-11-12 NOTE — Progress Notes (Addendum)
Paged MD about patient having persistent severe headache, pain meds were given patient's BP in mind.  Patient's pain has been 10/10 most of the time.  MD also aware that BP have been soft.  1 L NS bolus earlier.  Dopamine is at , per MD will wean to maintain BP of SBP at or above 90 and or MAP > 65.  No NEW orders received, MD aware of patient's headache.  Emotional support provided.

## 2013-11-12 NOTE — Procedures (Addendum)
History: 52 yo F with spells of unclear etiology. Also with history of behcets disease.   Sedation: Intermittent doses of fentanyl  Background: Throughout the study there are bursts of irregular delta, sometimes associated with the patient reaching up to grab her head. Of note, there are periods during which the patient appears to be hyperventilating and apparently was complaining of shortness of breath frequently during the study. There is a well defined posterior dominant rhythm of 9.5 Hz that attenuates with eye opening.   There was a single spell captured consisting of unresponsiveness following an episode of nausea/vomiting. The EEG did show diffuse irregular slowing during this spell.   Photic stimulation: Physiologic driving is not performed.   EEG Abnormalities: 1) Generalized irregular delta activity  Clinical Interpretation: This EEG captured a single spell associated with irregular slow activity. This could be seen with syncope, migraine, or a deep seizure focus that is not recorded on scalp electrode. Clinical correlation is warrented. There is also suggestoin of a generalized non-specific cerebral dysfunction(encephalopathy) with repeated bursts of irregular delta activity. This can also be seen in hypotension or  migraine.  No clear seizure was recorded, but I don't think that this is completely excluded.   [name], MD [facility] [phone]  If 7pm- 7am, please page neurology on call at [phone].

## 2013-11-12 NOTE — Progress Notes (Signed)
PULMONARY  / CRITICAL CARE MEDICINE  Name: Brenda Mcguire MRN: [id number] DOB: 06/04/1961    ADMISSION DATE:  11/10/2013 CONSULTATION DATE:  11/10/2013  REFERRING MD :  EDP [place] PRIMARY SERVICE:  PCCM  CHIEF COMPLAINT:  Status epilepticus  BRIEF PATIENT DESCRIPTION: 52 y/o with past medical history of seizures (pseudoseizures?) brought to [facility] ED with seizure activity.  In ED repeated seizures in spite of treatment with anticonvulsives.  Intubated for airway protection.  PCCM was consulted to accept the patient in transfer.  SIGNIFICANT EVENTS / STUDIES:  11/28 - Status epilepticus.  Intubated.  Transferred to [facility]. 11/29 - EEG without definitive seizure activity.    LINES / TUBES: OETT 11/28 >>>1129 OGT 11/28 >>>1129 Foley 11/28 >>> L chest Port ??? >>>  CULTURES:  ANTIBIOTICS:   INTERVAL HISTORY: Pt on low dose dopamine.  C/O headache and nausea.   VITAL SIGNS: Temp:  [97.5 F (36.4 C)-98.7 F (37.1 C)] 98.7 F (37.1 C) (11/30 0738) Pulse Rate:  [56-90] 82 (11/30 0800) Resp:  [10-22] 19 (11/30 0800) BP: (81-109)/(41-69) 94/44 mmHg (11/30 0800) SpO2:  [92 %-100 %] 95 % (11/30 0800) Weight:  [193 lb 9 oz (87.8 kg)] 193 lb 9 oz (87.8 kg) (11/30 0500)  VENTILATOR SETTINGS:   INTAKE / OUTPUT: Intake/Output     11/29 0701 - 11/30 0700 11/30 0701 - 12/01 0700   I.V. (mL/kg) 1736.2 (19.8) 59.8 (0.7)   NG/GT     IV Piggyback 875    Total Intake(mL/kg) 2611.2 (29.7) 59.8 (0.7)   Urine (mL/kg/hr) 2415 (1.1) 450 (1.4)   Emesis/NG output     Total Output 2415 450   Net +196.2 -390.2         PHYSICAL EXAMINATION: General:  wdwn female in NAD Neuro:  Awake, alert, MAE.  Appears anxious.   HEENT:  Mm pink/moist, no jvd Cardiovascular:  RRR, no m/r/g Lungs:  resp's even/non-labored, lungs bilaterally clear Abdomen:  Soft, nontender, bowel sounds diminished Musculoskeletal:  Moves all extremities, no edema Skin:  Intact  LABS:  CBC  Recent Labs Lab  11/10/13 1748 11/12/13 0400  WBC 6.8 5.2  HGB 11.2* 10.2*  HCT 32.1* 29.4*  PLT 210 172   Coag's  Recent Labs Lab 11/10/13 1748  APTT 28  INR 1.02   BMET  Recent Labs Lab 11/10/13 1748 11/12/13 0400  NA 138 142  K 3.5 3.7  CL 106 107  CO2 24 26  BUN 14 10  CREATININE 0.60 0.67  GLUCOSE 100* 77   Electrolytes  Recent Labs Lab 11/10/13 1748 11/12/13 0400  CALCIUM 8.7 8.4  MG 1.9  --   PHOS 3.3  --    ABG  Recent Labs Lab 11/10/13 1957 11/11/13 0422  PHART 7.357 7.369  PCO2ART 41.4 42.1  PO2ART 181.0* 109.0*   Liver Enzymes  Recent Labs Lab 11/10/13 1748  AST 22  ALT 32  ALKPHOS 78  BILITOT 0.3  ALBUMIN 3.5   Glucose  Recent Labs Lab 11/11/13 1215 11/11/13 1531 11/11/13 2012 11/12/13 0034 11/12/13 0337 11/12/13 0737  GLUCAP 78 80 76 79 71 82    CXR:  11/30>>>mild vascular congestion, L>R basilar atx  ASSESSMENT / PLAN:  PULMONARY A:   Acute respiratory failure in setting of status epilepticus. P:   Pulmonary hygiene / IS Follow CXR  CARDIOVASCULAR A:  Hypotension  No arrhythmia / ischemia. P:  Dopamine with Goal MAP>60 Assess CVP Q4 NS Bolus as CVP 3 x1 11/30  RENAL A:  No active issues. P:   Trend BMP NS to 75 ml/hr until tolerating PO's  GASTROINTESTINAL A:   No active issues. P:   Clear liquid diet as tolerated Protonix for GI Px  HEMATOLOGIC A:   No active issues. P:  Trend CBC SCDs for DVT Px APTT / INR  INFECTIOUS A:   No active issues. P:   Defer cultures and antibiotics Monitor fever curve / leukocytosis  ENDOCRINE  A:   No active issues.   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   Acute encephalopathy - resolved.  P:   Work up per Neurology Hold Xanax, Depakote, Neurontin, Hydrocodone, Keppra, Remeron, Ambien Anticonvulsive regimen per Neurology Limit  sedating medications  Canary Brim, NP-C [facility] Pgr: [phone] or [phone]  Remains on pressors, will hydrate and wean pressors off, hold narcs for hypotension and once off will transfer out.  I have personally obtained history, examined patient, evaluated and interpreted laboratory and imaging results, reviewed medical records, formulated assessment / plan and placed orders.  CRITICAL CARE:  The patient is critically ill with multiple organ systems failure and requires high complexity decision making for assessment and support, frequent evaluation and titration of therapies, application of advanced monitoring technologies and extensive interpretation of multiple databases. Critical Care Time devoted to patient care services described in this note is 35 minutes.   11/12/2013, 10:41 AM  Alyson Reedy, M.D. [facility] Pulmonary/Critical Care Medicine. Pager: [phone]. After hours pager: [phone].

## 2013-11-12 NOTE — Progress Notes (Signed)
Continuous LTM discontinued

## 2013-11-12 NOTE — Progress Notes (Signed)
Subjective: Complains of headache.   Was hypotensive overnight, started on dopamine  Exam: Filed Vitals:   11/12/13 0738  BP:   Pulse:   Temp: 98.7 F (37.1 C)  Resp:    Gen: In bed, appears to have bad headache MS: Awake, Alert, interactive.  CN: PERRL, EOMI, VFF Motor: Moves both sides well to command Sensory:intact to LT  Impression: 52 yo F presented with spells that I strongly suspect are psychogenic. Will review more recently captured spells today, but machine is currently down.   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   Recommendations: 1) Continue current AEDs, will review further EEG once available.  2) Reglan for headache 3) Will continue to follow  [name], MD [facility] [phone]  If 7pm- 7am, please page neurology on call at [phone].

## 2013-11-12 NOTE — Progress Notes (Signed)
Pt's BP in 80s/50s over last 2 hours. Pt was given a total of 100 mcg of Fentanyl over 1 hour due to a severe headache. She was crying and rated her headache a 10 out of 10. Dr. Delford Field notified and ordered 500 cc NS bolus. Will give and continue to monitor.   Alfonso Ellis, RN

## 2013-11-12 NOTE — Progress Notes (Signed)
eLink Physician-Brief Progress Note Patient Name: Brenda Mcguire DOB: 02/04/1961 MRN: [id number]  Date of Service  11/12/2013   HPI/Events of Note   Hypotension  eICU Interventions  Saline bolus   Intervention Category Major Interventions: Hypotension - evaluation and management  Shan Levans 11/12/2013, 12:16 AM

## 2013-11-12 NOTE — Progress Notes (Signed)
eLink Physician-Brief Progress Note Patient Name: Kaisha Wachob DOB: 02-16-1961 MRN: [id number]  Date of Service  11/12/2013   HPI/Events of Note   Not responding to volume.  MAP still in 50s.  Has CVL, no CVPs  eICU Interventions  chk cvp q4H Start dopamine drip    Intervention Category Major Interventions: Shock - evaluation and management  Shan Levans 11/12/2013, 3:11 AM

## 2013-11-12 NOTE — Progress Notes (Signed)
Paged CCM MD about patient's vomiting (after dinner), patient was given zofran and fentanyl prior to eating.  MD aware, will make patient NPO (excepts chips with meds).  Also made MD aware of BP in to upper 80-100s SBP mostly, patient is on of dopamine currently, able to use drip to maintain BP.   Patient maintain UO > 200 all shift

## 2013-11-12 NOTE — Progress Notes (Signed)
Patient offered bath, refused due to being in pain and not feeling well.

## 2013-11-13 ENCOUNTER — Encounter (HOSPITAL_COMMUNITY): Payer: Self-pay | Admitting: Radiology

## 2013-11-13 ENCOUNTER — Inpatient Hospital Stay (HOSPITAL_COMMUNITY): Payer: Federal, State, Local not specified - PPO

## 2013-11-13 LAB — BASIC METABOLIC PANEL
BUN: 8 mg/dL (ref 6–23)
Calcium: 9.1 mg/dL (ref 8.4–10.5)
GFR calc Af Amer: >90 mL/min (ref >90)
GFR calc non Af Amer: >90 mL/min (ref >90)
Glucose, Bld: 98 mg/dL (ref 70–99)
Potassium: 3.7 mEq/L (ref 3.5–5.1)
Sodium: 142 mEq/L (ref 135–145)

## 2013-11-13 LAB — CSF CELL COUNT WITH DIFFERENTIAL
RBC Count, CSF: 1 /mm3 — ABNORMAL HIGH
WBC, CSF: 1 /mm3 (ref 0–5)

## 2013-11-13 LAB — GLUCOSE, CAPILLARY
Glucose-Capillary: 112 mg/dL — ABNORMAL HIGH (ref 70–99)
Glucose-Capillary: 91 mg/dL (ref 70–99)

## 2013-11-13 LAB — CBC
MCH: 31.3 pg (ref 26.0–34.0)
MCHC: 35.5 g/dL (ref 30.0–36.0)
Platelets: 202 10*3/uL (ref 150–400)
RBC: 3.48 MIL/uL — ABNORMAL LOW (ref 3.87–5.11)

## 2013-11-13 LAB — PROTEIN AND GLUCOSE, CSF: Total  Protein, CSF: 46 mg/dL — ABNORMAL HIGH (ref 15–45)

## 2013-11-13 MED ORDER — SODIUM CHLORIDE 0.9 % IV SOLN
INTRAVENOUS | Status: DC
  Administered 2013-11-15: 06:00:00 via INTRAVENOUS

## 2013-11-13 MED ORDER — DEXAMETHASONE SODIUM PHOSPHATE 10 MG/ML IJ SOLN
10.0000 mg | Freq: Once | INTRAMUSCULAR | Status: AC
  Administered 2013-11-13: 10 mg via INTRAVENOUS
  Filled 2013-11-13: qty 1

## 2013-11-13 MED ORDER — KETOROLAC TROMETHAMINE 30 MG/ML IJ SOLN
30.0000 mg | Freq: Four times a day (QID) | INTRAMUSCULAR | Status: DC | PRN
  Administered 2013-11-13 – 2013-11-14 (×5): 30 mg via INTRAVENOUS
  Filled 2013-11-13 (×6): qty 1

## 2013-11-13 MED ORDER — ENOXAPARIN SODIUM 40 MG/0.4ML ~~LOC~~ SOLN
40.0000 mg | SUBCUTANEOUS | Status: DC
  Administered 2013-11-13 – 2013-11-15 (×3): 40 mg via SUBCUTANEOUS
  Filled 2013-11-13 (×3): qty 0.4

## 2013-11-13 NOTE — Progress Notes (Signed)
Pt tolerated LP. bandaid dressing intact, no bleeding noted. Pt with movement of extremities x4, no weakness, equal movement/strong movement. Dr [name] updated pt status. HOB flat x2 hour post procedure per MD after procedure. Pt states headache much improved. Will give 1 time dose of decadron as ordered. Will continue to monitor. Koren Bound

## 2013-11-13 NOTE — Procedures (Signed)
Indication: headache  Risks of the procedure were dicussed with the patient including post-LP headache, bleeding, infection, weakness/numbness of legs(radiculopathy), death.  The patient/patient's proxy agreed and written consent was obtained.   The patient was prepped and draped, and using sterile technique a 20 gauge quinke spinal needle was inserted in the L4-L5 space. The opening pressure was 18 cm H2O. Approximately 6 cc of CSF were obtained and sent for analysis.

## 2013-11-13 NOTE — Progress Notes (Signed)
Dr Molli Knock updated on chest xray report from this AM. MD updated position of subclavian central line, updated dopamine gtt and MIVF infusing thru line, MD ok to continue fluids via central line. Will continue to monitor. Brenda Mcguire

## 2013-11-13 NOTE — Progress Notes (Signed)
PULMONARY  / CRITICAL CARE MEDICINE  Name: Brenda Mcguire MRN: [id number] DOB: [birth year]    ADMISSION DATE:  11/10/2013 CONSULTATION DATE:  11/10/2013  REFERRING MD :  EDP [place] PRIMARY SERVICE:  PCCM  CHIEF COMPLAINT:  Status epilepticus  BRIEF PATIENT DESCRIPTION: 52 y/o with past medical history of seizures (pseudoseizures?) brought to [facility] ED with seizure activity.  In ED repeated seizures in spite of treatment with anticonvulsives.  Intubated for airway protection.  PCCM was consulted to accept the patient in transfer.  SIGNIFICANT EVENTS / STUDIES:  11/28 CT head: NAICP 11/28 - Status epilepticus.  Intubated.  Transferred to [facility] 11/29 - EEG without definitive seizure activity.   11/30 Repeat EEG: This EEG captured a single spell associated with irregular slow activity. This could be seen with syncope, migraine, or a deep seizure focus that is not recorded on scalp electrode. Clinical correlation is warrented. There is also suggestoin of a generalized non-specific cerebral dysfunction(encephalopathy) with repeated bursts of irregular delta activity. This can also be seen in hypotension or migraine. No clear seizure was recorded, but I don't think that this is completely excluded.  12/1 Repeat CT head: NAICP 12/01 LP [name]): opening pressure 18 cm H2O 12/01 Remains on low dose DA to maintain MAP goal of 65 mmHg. Asymptomatic   LINES / TUBES: L sided portacath (chronic) ETT 11/28 >>>1129 R IJ CVL 11/29 >>   CULTURES:  ANTIBIOTICS:   INTERVAL HISTORY: Remains on low dose dopamine. No new complaints   VITAL SIGNS: Temp:  [97.7 F (36.5 C)-98.8 F (37.1 C)] 98.3 F (36.8 C) (12/01 1149) Pulse Rate:  [59-93] 84 (12/01 1300) Resp:  [5-28] 13 (12/01 1300) BP: (76-143)/(38-90) 96/63 mmHg (12/01 1300) SpO2:  [85 %-100 %] 97 % (12/01 1300) Weight:  [86 kg (189 lb 9.5 oz)] 86 kg (189 lb 9.5 oz) (12/01 0600)  VENTILATOR SETTINGS:   INTAKE /  OUTPUT: Intake/Output     11/30 0701 - 12/01 0700 12/01 0701 - 12/02 0700   P.O.  225   I.V. (mL/kg) 1922.6 (22.4) 478 (5.6)   IV Piggyback 110 450   Total Intake(mL/kg) 2032.6 (23.6) 1153 (13.4)   Urine (mL/kg/hr) 5215 (2.5) 935 (1.5)   Total Output 5215 935   Net -3182.4 +218        Stool Occurrence  1 x    PHYSICAL EXAMINATION: General:  wdwn female in NAD Neuro:  Awake, alert, MAE.   HEENT:  Mm pink/moist, no jvd Cardiovascular:  RRR, no m/r/g Lungs:  resp's even/non-labored, lungs bilaterally clear Abdomen:  Soft, nontender, bowel sounds diminished Musculoskeletal:  Moves all extremities, no edema Skin:  Intact  LABS: I have reviewed all of today's lab results. Relevant abnormalities are discussed in the A/P section   CXR: NACPD  ASSESSMENT / PLAN:  PULMONARY A:   Acute respiratory failure in setting of status epilepticus, resolved P:   Supplemental O2 as needed  CARDIOVASCULAR A:  Hypotension, unclear etiology P:  Change goal MAP to 55 mmHg Wean DA to off  RENAL A:   No active issues. P:   DC IVFs Monitor BMET intermittently Correct electrolytes as indicated   GASTROINTESTINAL A:   No active issues. P:   SUP: N/I Advance to CHO mod diet   HEMATOLOGIC A:   No active issues. P:  Trend CBC DC SCDs Enoxaparin for DVT px until fully ambulatory  INFECTIOUS A:   No active issues. 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  P:   Further eval and mgmt per Neuro  Billy Fischer, MD ; [facility] [phone].  After 5:30 PM or weekends, call [phone]

## 2013-11-13 NOTE — Progress Notes (Signed)
Subjective: Compazine has provided some relief  Exam: Filed Vitals:   11/13/13 0800  BP: 110/63  Pulse: 69  Temp:   Resp: 13   Gen: In bed, Appears more comfortable on previous days MS: Awake, Alert, oriented and appropriate.  UJ:WJXBJ, EOMI Motor: moves all extremities well.  Sensory:  DTR:    Impression: 52 yo F with persistent headache following episodes of unresponsiveness. EEG showed some delta activity. This is a very non-specific finding and does confirm, but I feel does not completely exclude seizure activity. 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 At this time, given changes on subsequent EEG, I feel that purely psychogenic etiology has been made much less likely, though also still not completely excluded.   Recommendations: 1) CT head, if ok, will perform LP 2) continue compazine+toradol for suspected basilar migraine.  3) I would consider DHE contraindicated in this patient.  4) Will likely give steroids either way, but will need prolonged course if LP is positive.  5) will continue to follow.   [name], MD [facility] [phone]  If 7pm- 7am, please page neurology on call at [phone].

## 2013-11-14 LAB — URINE DRUGS OF ABUSE SCREEN W ALC, ROUTINE (REF LAB)
Amphetamine Screen, Ur: NEGATIVE
Benzodiazepines.: POSITIVE — AB
Cocaine Metabolites: NEGATIVE
Creatinine,U: 174.7 mg/dL
Ethyl Alcohol: <10 mg/dL (ref <10)
Methadone: NEGATIVE
Opiate Screen, Urine: NEGATIVE
Phencyclidine (PCP): NEGATIVE

## 2013-11-14 MED ORDER — METOCLOPRAMIDE HCL 10 MG PO TABS
10.0000 mg | ORAL_TABLET | Freq: Four times a day (QID) | ORAL | Status: DC | PRN
  Administered 2013-11-14 – 2013-11-15 (×3): 10 mg via ORAL
  Filled 2013-11-14 (×3): qty 1

## 2013-11-14 MED ORDER — DIVALPROEX SODIUM 500 MG PO DR TAB
750.0000 mg | DELAYED_RELEASE_TABLET | Freq: Three times a day (TID) | ORAL | Status: DC
  Administered 2013-11-14 – 2013-11-15 (×4): 750 mg via ORAL
  Filled 2013-11-14 (×6): qty 1

## 2013-11-14 MED ORDER — ACETAMINOPHEN 325 MG PO TABS
650.0000 mg | ORAL_TABLET | Freq: Once | ORAL | Status: AC
  Administered 2013-11-14: 650 mg via ORAL
  Filled 2013-11-14: qty 2

## 2013-11-14 MED ORDER — AMITRIPTYLINE HCL 25 MG PO TABS
25.0000 mg | ORAL_TABLET | Freq: Every day | ORAL | Status: DC
  Administered 2013-11-14: 21:00:00 25 mg via ORAL
  Filled 2013-11-14 (×2): qty 1

## 2013-11-14 MED ORDER — LEVETIRACETAM 500 MG PO TABS
1000.0000 mg | ORAL_TABLET | Freq: Two times a day (BID) | ORAL | Status: DC
  Administered 2013-11-14 – 2013-11-15 (×3): 1000 mg via ORAL
  Filled 2013-11-14 (×4): qty 2

## 2013-11-14 NOTE — Plan of Care (Signed)
Problem: Phase I Progression Outcomes Goal: Initial discharge plan identified Outcome: Completed/Met Date Met:  11/14/13 Home with husband

## 2013-11-14 NOTE — Progress Notes (Signed)
Pt complaint of headache 6/10, BP 116/74, pulse 87, Reglan 10mg  PO given at 2119 and IV Toradol ordered from pharmacy. Headache persist 7/10 Toradol 30mg  given IV at 2215. Headache persist Dr. Marchelle Gearing notified and one time order for tylenol given at 2330, BP 102/66, pulse 78. Pt in bed with lights off and using relaxation techniques, will continue to monitor and assist as needed. Brenda Mcguire, Brenda Mcguire [facility]

## 2013-11-14 NOTE — Progress Notes (Signed)
Reneta Niehaus is a 52 y.o. female patient who transferred  from 2S awake, alert  & orientated  X 3, Full Code, VSS - Blood pressure 128/84, pulse 73, temperature 98.2 F (36.8 C), temperature source Oral, resp. rate 18, height 5\' 7"  (1.702 m), weight 80.196 kg (176 lb 12.8 oz), SpO2 94.00%., , no c/o shortness of breath, no c/o chest pain, no distress noted.    IV site WDL: Left Port a cath with a transparent dsg that's clean dry and intact.  Allergies:   Allergies  Allergen Reactions  . Iohexol Anaphylaxis, Hives and Shortness Of Breath  . Codeine Itching  . Hepatitis B Vaccine   . 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 Other     CT Contrast     Past Medical History  Diagnosis Date  . GERD (gastroesophageal reflux disease)   . Hyperlipidemia   . History of blood transfusion   . Seizures     ? pseudoseizures  . Chronic headaches   . Phlebitis   . History of colonic polyps   . Anxiety   . 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 SR up x 2, fall risk assessment complete with Patient and family verbalizing understanding of risks associated with falls. Pt verbalizes an understanding of how to use the call bell and to call for help before getting out of bed.  Skin, clean-dry- intact without evidence of bruising, or skin tears.   No evidence of skin break down noted on exam.     Will cont to monitor and assist as needed.  Cindra Eves, RN 11/14/2013 5:21 PM

## 2013-11-14 NOTE — Progress Notes (Signed)
Received referral from chaplain through patient's nurse, requesting prayer. Patient said her mother is at [facility] and "is not doing well." She is very worried about her mother and upset that she cannot be with her. Patient was openly crying and requested prayer for herself and her mother. Chaplain provided emotional and spiritual support, prayer, caring presence, and reflective listening. Patient was grateful for chaplain support.   Will follow up or refer for follow up. Please page if Ms. Friesenhahn requests additional spiritual care.   Guy Sandifer [place], [place] [phone] After hours: [phone]

## 2013-11-14 NOTE — Progress Notes (Signed)
Subjective: Feels much better, still 2/10 headache  Exam: Filed Vitals:   11/14/13 0600  BP: 77/47  Pulse: 58  Temp:   Resp: 15   Gen: In bed, NAD MS: Awake, alert, oriented and appropriate CN: Pupils equal round and reactive to light, extraocular movements intact, visual fields full Motor: 5/5 in all extremities Sensory: Intact to light touch  LP results were unremarkable, borderline protein I feel is a very nonspecific finding  Impression: 52 year old female who presented with recurrent episodes of bilateral arm posturing and altered mental status and was intubated for concern for status epilepticus. On EEG, several of these episodes were captured associated with diffuse irregular delta activity. 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  She is also on Depakote as a seizure medicine which is useful also for migraine prophylaxis.  Recommendations: 1) continue antiepileptic medications at the current doses 2) would start amitriptyline 25 mg each bedtime for migraine prophylaxis 3) Could continue compazine and toradol for headache abortive while in the hospital. 4) May consider medrol dose pack on discharge if headache remains.  5) Could use compainze + benadryl PO for migraine abortive in the future.     [name], MD [facility] [phone]  If 7pm- 7am, please page neurology on call at [phone].

## 2013-11-14 NOTE — Progress Notes (Signed)
PULMONARY  / CRITICAL CARE MEDICINE  Name: Brenda Mcguire MRN: [id number] DOB: [birth year]    ADMISSION DATE:  11/10/2013 CONSULTATION DATE:  11/10/2013  REFERRING MD :  EDP [place] PRIMARY SERVICE:  PCCM  CHIEF COMPLAINT:  Status epilepticus  BRIEF PATIENT DESCRIPTION: 52 y/o with past medical history of seizures (pseudoseizures?) brought to [facility] ED with seizure activity.  In ED repeated seizures in spite of treatment with anticonvulsives.  Intubated for airway protection.  PCCM was consulted to accept the patient in transfer.  SIGNIFICANT EVENTS / STUDIES:  11/28 CT head: NAICP 11/28 - Status epilepticus.  Intubated.  Transferred to [facility]. 11/29 - EEG without definitive seizure activity.   11/30 Repeat EEG: This EEG captured a single spell associated with irregular slow activity. This could be seen with syncope, migraine, or a deep seizure focus that is not recorded on scalp electrode. Clinical correlation is warrented. There is also suggestoin of a generalized non-specific cerebral dysfunction(encephalopathy) with repeated bursts of irregular delta activity. This can also be seen in hypotension or migraine. No clear seizure was recorded, but I don't think that this is completely excluded.  12/1 Repeat CT head: NAICP 12/01 LP Brenda Mcguire): opening pressure 18 cm H2O 12/01 Remains on low dose DA to maintain MAP goal of 65 mmHg. Asymptomatic 12/02 Off DA. Mild hypotension but asymptomatic. Transfer to med-surg bed   LINES / TUBES: L sided portacath (chronic) ETT 11/28 >>>1129 R IJ CVL 11/29 >> 12/02  CULTURES:  ANTIBIOTICS:   INTERVAL HISTORY:  No new complaints. No distress   VITAL SIGNS: Temp:  [97.8 F (36.6 C)-98.4 F (36.9 C)] 98.2 F (36.8 C) (12/02 1255) Pulse Rate:  [55-97] 78 (12/02 1000) Resp:  [11-19] 12 (12/02 1000) BP: (77-111)/(44-66) 111/65 mmHg (12/02 1000) SpO2:  [75 %-99 %] 98 % (12/02 1000) Weight:  [86.5 kg (190 lb 11.2 oz)] 86.5 kg (190 lb 11.2 oz)  (12/02 0600)  VENTILATOR SETTINGS:   INTAKE / OUTPUT: Intake/Output     12/01 0701 - 12/02 0700 12/02 0701 - 12/03 0700   P.O. 345    I.V. (mL/kg) 778 (9) 20 (0.2)   IV Piggyback 277.5    Total Intake(mL/kg) 1400.5 (16.2) 20 (0.2)   Urine (mL/kg/hr) 1600 (0.8)    Total Output 1600     Net -199.5 +20        Stool Occurrence 1 x     PHYSICAL EXAMINATION: General: NAD Neuro:  No focal deficits  HEENT: WNL Cardiovascular:  RRR s M Lungs: CTA Abdomen:  Soft, NT, NABS Ext: no edema  LABS: I have reviewed all of today's lab results. Relevant abnormalities are discussed in the A/P section   CXR: NNF  ASSESSMENT / PLAN:  PULMONARY A:   Acute respiratory failure in setting of status epilepticus, resolved P:   Supplemental O2 as needed  CARDIOVASCULAR A:  Hypotension, unclear etiology, asymptomatic P:  MAP to 55 mmHg  RENAL A:   No active issues. P:   Monitor BMET intermittently Correct electrolytes as indicated   GASTROINTESTINAL A:   No active issues. P:   SUP: N/I Cont CHO mod diet  HEMATOLOGIC A:   No active issues. P:  Trend CBC Cont enoxaparin for DVT px   INFECTIOUS A:   No active issues. 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  P:   Further eval and mgmt per Neuro   Ready for DC home when OK by Neuro as long as BP OK  Brenda Fischer, MD ; [place] [phone].  After 5:30 PM or weekends, call [phone]

## 2013-11-15 ENCOUNTER — Encounter: Payer: Self-pay | Admitting: Adult Health

## 2013-11-15 DIAGNOSIS — M352 Behcet's disease: Secondary | ICD-10-CM

## 2013-11-15 DIAGNOSIS — R569 Unspecified convulsions: Secondary | ICD-10-CM

## 2013-11-15 MED ORDER — HEPARIN SOD (PORK) LOCK FLUSH 100 UNIT/ML IV SOLN
500.0000 [IU] | INTRAVENOUS | Status: DC
  Filled 2013-11-15: qty 5

## 2013-11-15 MED ORDER — LEVETIRACETAM 1000 MG PO TABS: 1000.0000 mg | ORAL_TABLET | Freq: Two times a day (BID) | ORAL | Status: DC

## 2013-11-15 MED ORDER — AMITRIPTYLINE HCL 25 MG PO TABS: 25.0000 mg | ORAL_TABLET | Freq: Every day | ORAL | Status: DC

## 2013-11-15 MED ORDER — DIPHENHYDRAMINE HCL 25 MG PO CAPS: 25.0000 mg | ORAL_CAPSULE | Freq: Three times a day (TID) | ORAL | Status: DC | PRN

## 2013-11-15 MED ORDER — HEPARIN SOD (PORK) LOCK FLUSH 100 UNIT/ML IV SOLN
500.0000 [IU] | INTRAVENOUS | Status: DC | PRN
  Filled 2013-11-15: qty 5

## 2013-11-15 MED ORDER — PROCHLORPERAZINE MALEATE 10 MG PO TABS: 10.0000 mg | ORAL_TABLET | Freq: Three times a day (TID) | ORAL | Status: DC | PRN

## 2013-11-15 NOTE — Progress Notes (Addendum)
Pt complaint "light shining in eyes" to check pupil response is making the headache worst. Pt requests to stop neuro checks. Neuro checks have been stable, will stop Q2H neuro checks per patients request. Will continue to monitor and assist as needed. Daija, [facility]

## 2013-11-15 NOTE — Progress Notes (Signed)
Patient discharge teaching given, including activity, diet, follow-up appoints, and medications. Patient verbalized understanding of all discharge instructions. Port was deaccessed by IV team. Vitals are stable. Skin is intact except as charted in most recent assessments. Pt to be escorted out by NT, to be driven home by family.

## 2013-11-15 NOTE — Progress Notes (Signed)
Pt asleep, no further complaints of headache. Shukri, [facility]

## 2013-11-15 NOTE — Care Management Note (Signed)
    Page 1 of 1   11/15/2013     11:02:05 AM   CARE MANAGEMENT NOTE 11/15/2013  Patient:  Brenda Mcguire, Brenda Mcguire   Account Number:  [id number]  Date Initiated:  11/14/2013  Documentation initiated by:  MAYO,HENRIETTA  Subjective/Objective Assessment:   adm with dx of status epilepticus; lives with spouse    Primary Care: Nicki Reaper NP with [facility]     Action/Plan:   Anticipated DC Date:  11/15/2013   Anticipated DC Plan:  HOME/SELF CARE      DC Planning Services  CM consult      Choice offered to / List presented to:             Status of service:  Completed, signed off Medicare Important Message given?   (If response is "NO", the following Medicare IM given date fields will be blank) Date Medicare IM given:   Date Additional Medicare IM given:    Discharge Disposition:  HOME/SELF CARE  Per UR Regulation:  Reviewed for med. necessity/level of care/duration of stay  If discussed at Long Length of Stay Meetings, dates discussed:    Comments:  11/15/13 10:43 Letha Cape RN, BSN [phone] patient lives with spouse, per RN, patient is for dc today, No NCM referral, no needs anticipated.

## 2013-11-15 NOTE — Progress Notes (Signed)
Neurohospitalist Resident Progress Note  Subjective: States that she has done well overnight. Denies headache, requesting to go home and resume work "tomorrow". As a Engineer, civil (consulting).  Exam: Filed Vitals:   11/15/13 0704  BP: 123/83  Pulse: 66  Temp: 98.4 F (36.9 C)  Resp: 14   Physical Exam: Gen: Resting upright in bed, NAD Lungs: clears clear bilaterally Heart: RRR without murmurs appreciated Abdomen: benign Extremities: no pedal edema, distal pulses intact Menta Status: Awake, alert, oriented x3 and answers questions appropriately CN: Pupils equal round and reactive to light, extraocular movements intact, visual fields full Motor: 5/5 in all extremities Sensory: Intact to light touch  11/13/2013 CT HEAD WITHOUT CONTRAST  TECHNIQUE:  Contiguous axial images were obtained from the base of the skull  through the vertex without intravenous contrast.  COMPARISON: 11/10/2013 and 04/12/2013.  FINDINGS:  No intracranial hemorrhage.  No CT evidence of large acute infarct.  No intracranial mass lesion noted on this unenhanced exam.  If further delineation for seizure focus or sequelae of vasculitis  is clinically desired, contrast enhanced MR may be considered.  No hydrocephalus.  Mastoid air cells, middle ear cavities and visualized paranasal  sinuses are clear.  IMPRESSION:  No acute abnormality. Please see above.  LP results were unremarkable  Assessment: 52 year old female who presented with recurrent episodes of bilateral arm posturing and altered mental status and was intubated secondary to concern for status epilepticus. On EEG, several of these episodes were captured associated with diffuse irregular delta activity. 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 Would defer stopping antiepileptics at this point.  Recommendations: 1) continue antiepileptic medications at the current doses including Depakote which is useful for migraine prophylaxis.  Alternative could be Topomax 25 mg  bid. 2) cont amitriptyline 25 mg each bedtime for migraine prophylaxis 3) discontinue compazine and toradol for headache abortive given likely discharge home today. 4) Could use compazine + benadryl PO for migraine abortive in the future.  5) Plan to discharge home today.  Recommend follow-up with neurology 2-3 weeks.   Kristie Cowman, MD Internal Medicine PGY-3  If 7pm- 7am, please page neurology on call at [phone].   Agree with above assessment and plan.  Wyatt Portela, MD [facility]

## 2013-11-15 NOTE — Discharge Summary (Signed)
Physician Discharge Summary  Patient ID: Brenda Mcguire MRN: [id number] DOB/AGE: [birth year] 52 y.o.  Admit date: 11/10/2013 Discharge date: 11/15/2013    Discharge Diagnoses:  Active Problems:   Status epilepticus   Acute respiratory failure   Encephalopathy acute   Hypotension, unspecified    Brief Summary: Brenda Mcguire is a 52 y/o with past medical history of behcets disease, seizures (pseudoseizures?) brought to [facility] ED with seizure activity 11/28. In ED repeated seizures in spite of treatment with anticonvulsives. Intubated for airway protection and transferred to [facility] for further neuro workup.  Course was c/b hypotension likely medication related in setting sedation.  Remained on low dose dopamine and was weaned off 12/2.  Now much improved. Normotensive off pressors.  Course also c/b ?migraine headache, likely basilar migraine suspected per neuro with EEG findings as below. Also underwent LP given presentation and hx of behcets to w/u asceptic meningitis which was r/o.  No further seizure activity.  Purely psychogenic etiology thought much less likely given noted changes on EEG likely c/w basilar migraine. Per Neuro recs she is now ready for d/c home with anti-epileptics as below, new amitriptyline for migraine prophylaxis and PO compazine/benadryl for migraine abortive. She will f/u with her PCP and neuro as below.    SIGNIFICANT EVENTS / STUDIES:  11/28 CT head: NAICP  11/28 - Status epilepticus. Intubated. Transferred to [facility].  11/29 - EEG without definitive seizure activity.  11/30 Repeat EEG: This EEG captured a single spell associated with irregular slow activity. This could be seen with syncope, migraine, or a deep seizure focus that is not recorded on scalp electrode. Clinical correlation is warrented. There is also suggestoin of a generalized non-specific cerebral dysfunction(encephalopathy) with repeated bursts of irregular delta activity. This can also be seen in  hypotension or migraine. No clear seizure was recorded, but I don't think that this is completely excluded.  12/1 Repeat CT head: NAICP  12/01 LP [name]): opening pressure 18 cm H2O  12/01 Remains on low dose DA to maintain MAP goal of 65 mmHg. Asymptomatic  12/02 Off DA. Mild hypotension but asymptomatic. Transfer to med-surg bed   LINES / TUBES:  L sided portacath (chronic)  ETT 11/28 >>>1129  R IJ CVL 11/29 >> 12/02   CULTURES:   ANTIBIOTICS:                                                                     Hospital Summary by Discharge Diagnosis   Filed Vitals:   11/14/13 1700 11/14/13 2102 11/14/13 2322 11/15/13 0704  BP: 128/84 116/74 102/66 123/83  Pulse: 73 87 78 66  Temp: 98.2 F (36.8 C) 98.6 F (37 C) 98.7 F (37.1 C) 98.4 F (36.9 C)  TempSrc: Oral Oral Oral Oral  Resp: 18 14 15 14   Height: 5\' 7"  (1.702 m)     Weight: 176 lb 12.8 oz (80.196 kg)     SpO2: 94% 94% 92% 96%     Discharge Labs  BMET  Recent Labs Lab 11/10/13 1748 11/12/13 0400 11/13/13 0540  NA 138 142 142  K 3.5 3.7 3.7  CL 106 107 107  CO2 24 26 27   GLUCOSE 100* 77 98  BUN 14 10 8   CREATININE 0.60 0.67  0.53  CALCIUM 8.7 8.4 9.1  MG 1.9  --   --   PHOS 3.3  --   --      CBC   Recent Labs Lab 11/10/13 1748 11/12/13 0400 11/13/13 0540  HGB 11.2* 10.2* 10.9*  HCT 32.1* 29.4* 30.7*  WBC 6.8 5.2 4.8  PLT 210 172 202   Anti-Coagulation  Recent Labs Lab 11/10/13 1748  INR 1.02       Future Appointments Provider Department Dept Phone   11/23/2013 11:45 AM Nicki Reaper, NP [facility] [phone]         Follow-up Information   Follow up with Nicki Reaper, NP On 11/23/2013. (appt @ 1145.        [address] [place]  [phone] )    Specialty:  Internal Medicine   Contact information:   [address] [place] [address] [phone]       Follow up with [facility]. Schedule an appointment as  soon as possible for a visit in 2 weeks.   Contact information:   [address]     [address] [place] [address] [phone]         Medication List    STOP taking these medications       ALPRAZolam 0.5 MG tablet  Commonly known as:  XANAX     glucagon 1 MG injection     mirtazapine 15 MG disintegrating tablet  Commonly known as:  REMERON SOL-TAB     zolpidem 12.5 MG CR tablet  Commonly known as:  AMBIEN CR      TAKE these medications       amitriptyline 25 MG tablet  Commonly known as:  ELAVIL  Take 1 tablet (25 mg total) by mouth at bedtime.     diphenhydrAMINE 25 mg capsule  Commonly known as:  BENADRYL  Take 1 capsule (25 mg total) by mouth every 8 (eight) hours as needed (with compazine as needed for headache).     divalproex 250 MG DR tablet  Commonly known as:  DEPAKOTE  Take 3 tablets (750 mg total) by mouth 3 (three) times daily.     gabapentin 300 MG capsule  Commonly known as:  NEURONTIN  Take 2 capsules (600 mg total) by mouth 3 (three) times daily.     HYDROcodone-acetaminophen 5-325 MG per tablet  Commonly known as:  NORCO/VICODIN  Take 1 tablet by mouth every 12 (twelve) hours.     hydroxychloroquine 200 MG tablet  Commonly known as:  PLAQUENIL  Take 1 tablet (200 mg total) by mouth 2 (two) times daily.     levETIRAcetam 1000 MG tablet  Commonly known as:  KEPPRA  Take 1 tablet (1,000 mg total) by mouth 2 (two) times daily.     ondansetron 4 MG disintegrating tablet  Commonly known as:  ZOFRAN-ODT  Take 4 mg by mouth every 4 (four) hours as needed for nausea or vomiting.     prochlorperazine 10 MG tablet  Commonly known as:  COMPAZINE  Take 1 tablet (10 mg total) by mouth every 8 (eight) hours as needed (take with benadryl as needed for migraine headache).          Disposition: 01-Home or Self Care  Discharged Condition: Brenda Mcguire has met maximum benefit of inpatient care and is medically stable and cleared for  discharge.  Patient is pending follow up as above.      Time spent on disposition:  Greater than 35 minutes.   SignedDanford Bad, NP 11/15/2013  1:30 PM Pager: [phone] or [phone]    Agree with above  Brenda Mcguire Fischer, MD ; [facility] service Mobile [phone].  After 5:30 PM or weekends, call [phone]

## 2013-11-16 ENCOUNTER — Telehealth: Payer: Self-pay

## 2013-11-16 ENCOUNTER — Encounter: Payer: Self-pay | Admitting: Internal Medicine

## 2013-11-16 LAB — BENZODIAZEPINE, QUANTITATIVE, URINE
Estazolam (GC/LC/MS), ur confirm: NEGATIVE ng/mL
Flunitrazepam metabolite (GC/LC/MS), ur confirm: NEGATIVE ng/mL
Flurazepam GC/MS Conf: NEGATIVE ng/mL
Lorazepam (GC/LC/MS), ur confirm: 2138 ng/mL
Midazolam (GC/LC/MS), ur confirm: >7500 ng/mL
Temazepam GC/MS Conf: 279 ng/mL

## 2013-11-16 NOTE — Telephone Encounter (Signed)
Patient advised.

## 2013-11-16 NOTE — Telephone Encounter (Signed)
Pt left v/m; pt discharged from [facility] on 11/15/13 and pt has f/u appt with Brenda Reaper NP on 11/24/13. pt said spiked fever this morning early of 104.7; pt taking tylenol and pushing fluids and now temp 100.4. Pt does not want to return to ED. Pt was intubated and on ventilator while in hospital. Pt is not sure where fever is coming from and wants to know what she should do. Unable to reach pt by phone to see if any UTI, URI or other symptoms that might be related to fever.Please advise.

## 2013-11-16 NOTE — Telephone Encounter (Signed)
Please call pt again.. She needs to return to ED for high fever given recent seizures and respiratory failure requiring admission / intubation in last 24 hours as clinic is now closed.

## 2013-11-23 ENCOUNTER — Encounter: Payer: Self-pay | Admitting: Internal Medicine

## 2013-11-23 ENCOUNTER — Ambulatory Visit: Payer: Federal, State, Local not specified - PPO | Admitting: Internal Medicine

## 2013-11-24 ENCOUNTER — Ambulatory Visit: Payer: Federal, State, Local not specified - PPO | Admitting: Internal Medicine

## 2013-11-27 ENCOUNTER — Encounter: Payer: Self-pay | Admitting: Internal Medicine

## 2013-11-29 ENCOUNTER — Encounter: Payer: Self-pay | Admitting: Radiology

## 2013-11-30 ENCOUNTER — Ambulatory Visit (INDEPENDENT_AMBULATORY_CARE_PROVIDER_SITE_OTHER): Payer: Federal, State, Local not specified - PPO | Admitting: Internal Medicine

## 2013-11-30 ENCOUNTER — Encounter: Payer: Self-pay | Admitting: Internal Medicine

## 2013-11-30 VITALS — BP 102/64 | HR 97 | Temp 98.3°F | Ht 67.0 in | Wt 190.0 lb

## 2013-11-30 DIAGNOSIS — F445 Conversion disorder with seizures or convulsions: Secondary | ICD-10-CM

## 2013-11-30 DIAGNOSIS — R569 Unspecified convulsions: Secondary | ICD-10-CM

## 2013-11-30 DIAGNOSIS — R51 Headache: Secondary | ICD-10-CM

## 2013-11-30 DIAGNOSIS — G43909 Migraine, unspecified, not intractable, without status migrainosus: Secondary | ICD-10-CM

## 2013-11-30 MED ORDER — HYDROCODONE-ACETAMINOPHEN 5-325 MG PO TABS: 1.0000 | ORAL_TABLET | Freq: Two times a day (BID) | ORAL | Status: DC

## 2013-11-30 NOTE — Progress Notes (Signed)
Pre-visit discussion using our clinic review tool. No additional management support is needed unless otherwise documented below in the visit note.  

## 2013-11-30 NOTE — Patient Instructions (Signed)
Bickerstaff's Syndrome (Basilar Migraine) CAUSES  When migraine affects the circulation in back of the brain or neck, it can cause basilar migraine or Bickerstaff's syndrome.  SYMPTOMS  It occurs most frequently in young women. Symptoms include:  Dizziness.  Double vision.  Loss of balance.  Confusion.  Slurred speech.  Fainting.  Disorientation.  During the severe (acute) headache, some people lose consciousness. Often these patients are mistakenly thought to be intoxicated, under the influence of drugs, or suffering from other conditions. A previous history of migraine is helpful in making the diagnosis.  TREATMENT  Basilar migraines are treated medically the same as all other migraines. HOME CARE INSTRUCTIONS   If this is your first diagnosed migraine headache, you may simply choose to wait and watch. You can wait to see if you have another headache before deciding on a further treatment.  You may consult your caregiver or do as suggested by the current treating caregiver.  Numerous medications can prevent these headaches if they are recurrent or should they become recurrent. Your caregiver can help you with a medication or treatment program that will be helpful to you.  If this has been a chronic (long-standing) condition, using continuous narcotics is not recommended. Using long-term narcotics can cause recurrent migraines. Narcotics are a temporary measure only. They are used for the infrequent migraine that fails to respond to all other measures. SEEK IMMEDIATE MEDICAL CARE IF:   You do not get relief from the medications given to you or you have a recurrence of pain.  You have an unexplained oral temperature above 102 F (38.9 C), or as your caregiver suggests.  You have a stiff neck.  You have loss of vision.  You have muscular weakness.  You have loss of muscular control.  You develop severe symptoms different from your first symptoms.  You start losing  your balance or have trouble walking.  You feel faint or pass out. MAKE SURE YOU:   Understand these instructions.  Will watch your condition.  Will get help right away if you are not doing well or get worse. Document Released: 11/30/2005 Document Revised: 02/22/2012 Document Reviewed: 07/18/2008 [facility] Patient Information 2014 [place], [place].

## 2013-11-30 NOTE — Progress Notes (Signed)
Subjective:    Patient ID: Brenda Mcguire, female    DOB: 03-15-1961, 52 y.o.   MRN: [id number]  HPI  Pt presents to the clinic today for hospital followup. She was admitted on 11/28 for possible seizures, intubated for airway protection, on pressors for hypotension (sedation related). 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 Since discharge on 12/3 she has been doing well. She denies headache, fever, nausea or any other recurrent symptoms.  Additionally, we discussed her controlled substance agreement. She tested negative for any controlled substances although she is picking up her RX every month. She did tell me she only takes the medication as needed which would explain why it was not in her system.   Review of Systems      Past Medical History  Diagnosis Date  . GERD (gastroesophageal reflux disease)   . Hyperlipidemia   . History of blood transfusion   . Seizures     ? pseudoseizures  . Chronic headaches   . Phlebitis   . History of colonic polyps   . Anxiety   . 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 ALPRAZolam (XANAX) 0.5 MG tablet       . amitriptyline (ELAVIL) 25 MG tablet Take 1 tablet (25 mg total) by mouth at bedtime.  30 tablet  0  . diphenhydrAMINE (BENADRYL) 25 mg capsule Take 1 capsule (25 mg total) by mouth every 8 (eight) hours as needed (with compazine as needed for headache).      . divalproex (DEPAKOTE) 250 MG DR tablet Take 3 tablets (750 mg total) by mouth 3 (three) times daily.  90 tablet  2  . gabapentin (NEURONTIN) 300 MG capsule Take 2 capsules (600 mg total) by mouth 3 (three) times daily.  180 capsule  2  . HYDROcodone-acetaminophen (NORCO/VICODIN) 5-325 MG per tablet Take 1 tablet by mouth every 12 (twelve) hours.  60 tablet  0  . hydroxychloroquine (PLAQUENIL) 200 MG tablet Take 1  tablet (200 mg total) by mouth 2 (two) times daily.  60 tablet  3  . levETIRAcetam (KEPPRA) 1000 MG tablet Take 1 tablet (1,000 mg total) by mouth 2 (two) times daily.  60 tablet  0  . ondansetron (ZOFRAN-ODT) 4 MG disintegrating tablet Take 4 mg by mouth every 4 (four) hours as needed for nausea or vomiting.      . prochlorperazine (COMPAZINE) 10 MG tablet Take 1 tablet (10 mg total) by mouth every 8 (eight) hours as needed (take with benadryl as needed for migraine headache).  15 tablet  0  . promethazine (PHENERGAN) 25 MG tablet       . zolpidem (AMBIEN CR) 12.5 MG CR tablet        No current facility-administered medications for this visit.    Allergies  Allergen Reactions  . Iohexol Anaphylaxis, Hives and Shortness Of Breath  . Codeine Itching  . Hepatitis B Vaccine   . 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 Other     CT Contrast    Family History  Problem Relation Age of Onset  . Diabetes Mother   . Hyperlipidemia Mother   . Stroke Mother   . Hypertension Mother   . Colon cancer Mother   . Breast cancer Mother   . Early death Father   .  Heart attack Father   . Heart disease Father   . Hyperlipidemia Father   . COPD Sister     History   Social History  . Marital Status: Married    Spouse Name: N/A    Number of Children: 2  . Years of Education: 14   Occupational History  . Nurse     [employer]  .     Social History Main Topics  . Smoking status: Never Smoker   . Smokeless tobacco: Never Used  . Alcohol Use: No  . Drug Use: No  . Sexual Activity: Yes    Birth Control/ Protection: Surgical   Other Topics Concern  . Not on file   Social History Narrative   Regular exercise-no   Caffeine Use-yes     Constitutional: Denies fever, malaise, fatigue, headache or abrupt weight changes.  Respiratory: Denies difficulty breathing, shortness of breath, cough or sputum production.   Cardiovascular: Denies chest pain, chest tightness,  palpitations or swelling in the hands or feet.  Gastrointestinal: Denies abdominal pain, bloating, constipation, diarrhea or blood in the stool.  Neurological: Denies dizziness, difficulty with memory, difficulty with speech or problems with balance and coordination.   No other specific complaints in a complete review of systems (except as listed in HPI above).  Objective:   Physical Exam  BP 102/64  Pulse 97  Temp(Src) 98.3 F (36.8 C) (Oral)  Ht 5\' 7"  (1.702 m)  Wt 190 lb (86.183 kg)  BMI 29.75 kg/m2  SpO2 98% Wt Readings from Last 3 Encounters:  11/30/13 190 lb (86.183 kg)  11/14/13 176 lb 12.8 oz (80.196 kg)  09/11/13 178 lb 9 oz (80.995 kg)    General: Appears her stated age, well developed, well nourished in NAD. Cardiovascular: Normal rate and rhythm. S1,S2 noted.  No murmur, rubs or gallops noted. No JVD or BLE edema. No carotid bruits noted. Pulmonary/Chest: Normal effort and positive vesicular breath sounds. No respiratory distress. No wheezes, rales or ronchi noted.  Abdomen: Soft and nontender. Normal bowel sounds, no bruits noted. No distention or masses noted. Liver, spleen and kidneys non palpable. Neurological: Alert and oriented. Cranial nerves II-XII intact. Coordination normal. +DTRs bilaterally.   BMET    Component Value Date/Time   NA 142 11/13/2013 0540   K 3.7 11/13/2013 0540   CL 107 11/13/2013 0540   CO2 27 11/13/2013 0540   GLUCOSE 98 11/13/2013 0540   BUN 8 11/13/2013 0540   CREATININE 0.53 11/13/2013 0540   CALCIUM 9.1 11/13/2013 0540   GFRNONAA >90 11/13/2013 0540   GFRAA >90 11/13/2013 0540    Lipid Panel     Component Value Date/Time   CHOL 234* 03/02/2013 1115   TRIG 83.0 03/02/2013 1115   HDL 45.90 03/02/2013 1115   CHOLHDL 5 03/02/2013 1115   VLDL 16.6 03/02/2013 1115    CBC    Component Value Date/Time   WBC 4.8 11/13/2013 0540   RBC 3.48* 11/13/2013 0540   HGB 10.9* 11/13/2013 0540   HCT 30.7* 11/13/2013 0540   PLT 202 11/13/2013 0540    MCV 88.2 11/13/2013 0540   MCH 31.3 11/13/2013 0540   MCHC 35.5 11/13/2013 0540   RDW 11.8 11/13/2013 0540   LYMPHSABS 2.3 11/10/2013 1748   MONOABS 0.4 11/10/2013 1748   EOSABS 0.1 11/10/2013 1748   BASOSABS 0.0 11/10/2013 1748    Hgb A1C No results found for this basename: HGBA1C         Assessment & Plan:  Bibasillar Migraines:  You have been started on amitryptiline Take as directed and follow up with your neruologist  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 Time to review med records approx 15 minutes

## 2013-12-05 ENCOUNTER — Encounter: Payer: Self-pay | Admitting: Internal Medicine

## 2013-12-13 ENCOUNTER — Other Ambulatory Visit: Payer: Self-pay | Admitting: Internal Medicine

## 2013-12-13 ENCOUNTER — Encounter: Payer: Self-pay | Admitting: Internal Medicine

## 2013-12-13 NOTE — Telephone Encounter (Signed)
Last filled 11/07/13--please advise 

## 2013-12-16 ENCOUNTER — Encounter: Payer: Self-pay | Admitting: Internal Medicine

## 2013-12-18 MED ORDER — LIDOCAINE VISCOUS 2 % MT SOLN: 20.0000 mL | OROMUCOSAL | Status: DC | PRN

## 2013-12-18 MED ORDER — ALPRAZOLAM 0.5 MG PO TABS: 0.5000 mg | ORAL_TABLET | Freq: Two times a day (BID) | ORAL | Status: DC | PRN

## 2013-12-18 MED ORDER — ZOLPIDEM TARTRATE ER 12.5 MG PO TBCR: 12.5000 mg | EXTENDED_RELEASE_TABLET | Freq: Every evening | ORAL | Status: DC | PRN

## 2013-12-18 NOTE — Telephone Encounter (Signed)
Please phone in xanax and [place]

## 2013-12-19 ENCOUNTER — Other Ambulatory Visit: Payer: Self-pay | Admitting: Internal Medicine

## 2013-12-20 ENCOUNTER — Encounter: Payer: Self-pay | Admitting: Internal Medicine

## 2013-12-20 NOTE — Telephone Encounter (Signed)
[facility]

## 2013-12-23 ENCOUNTER — Encounter: Payer: Self-pay | Admitting: Internal Medicine

## 2013-12-23 DIAGNOSIS — M352 Behcet's disease: Secondary | ICD-10-CM

## 2013-12-23 DIAGNOSIS — G43909 Migraine, unspecified, not intractable, without status migrainosus: Secondary | ICD-10-CM

## 2013-12-27 ENCOUNTER — Encounter: Payer: Self-pay | Admitting: Internal Medicine

## 2013-12-27 DIAGNOSIS — R51 Headache: Principal | ICD-10-CM

## 2013-12-27 DIAGNOSIS — G8929 Other chronic pain: Secondary | ICD-10-CM

## 2013-12-27 DIAGNOSIS — R519 Headache, unspecified: Secondary | ICD-10-CM

## 2013-12-28 ENCOUNTER — Telehealth: Payer: Self-pay

## 2013-12-28 ENCOUNTER — Encounter: Payer: Self-pay | Admitting: Internal Medicine

## 2013-12-28 MED ORDER — AMITRIPTYLINE HCL 25 MG PO TABS: 25.0000 mg | ORAL_TABLET | Freq: Every day | ORAL | Status: DC

## 2013-12-28 MED ORDER — HYDROCODONE-ACETAMINOPHEN 5-325 MG PO TABS: 1.0000 | ORAL_TABLET | Freq: Two times a day (BID) | ORAL | Status: DC

## 2013-12-28 NOTE — Telephone Encounter (Signed)
Spoke to patient's husband letting him know the Rx was ready for pick up

## 2014-01-20 ENCOUNTER — Other Ambulatory Visit: Payer: Self-pay | Admitting: Internal Medicine

## 2014-01-22 ENCOUNTER — Encounter: Payer: Self-pay | Admitting: Internal Medicine

## 2014-01-22 MED ORDER — ALPRAZOLAM 0.5 MG PO TABS: ORAL_TABLET | ORAL | Status: DC

## 2014-01-22 NOTE — Telephone Encounter (Signed)
Please phone in xanax 

## 2014-01-22 NOTE — Telephone Encounter (Signed)
Xanax called into pharmacy and Phenergan sent to pharmacy--also i added every 3 months for UDS in chart

## 2014-01-22 NOTE — Telephone Encounter (Signed)
i see you authorized to phone in Xanax but we also have request for Phenergan--please advise

## 2014-01-22 NOTE — Telephone Encounter (Signed)
Ok to fill phenergan as well, make sure it is documented in her chart that she needs to come every 3 months for the UDS

## 2014-01-27 ENCOUNTER — Encounter: Payer: Self-pay | Admitting: Internal Medicine

## 2014-01-29 ENCOUNTER — Other Ambulatory Visit: Payer: Self-pay | Admitting: Internal Medicine

## 2014-01-29 ENCOUNTER — Other Ambulatory Visit: Payer: Self-pay

## 2014-01-29 ENCOUNTER — Encounter: Payer: Self-pay | Admitting: Internal Medicine

## 2014-01-29 DIAGNOSIS — R519 Headache, unspecified: Secondary | ICD-10-CM

## 2014-01-29 DIAGNOSIS — R51 Headache: Principal | ICD-10-CM

## 2014-01-29 MED ORDER — HYDROCODONE-ACETAMINOPHEN 5-325 MG PO TABS: 1.0000 | ORAL_TABLET | Freq: Two times a day (BID) | ORAL | Status: DC

## 2014-01-30 ENCOUNTER — Encounter: Payer: Self-pay | Admitting: Internal Medicine

## 2014-01-30 ENCOUNTER — Other Ambulatory Visit: Payer: Self-pay | Admitting: Internal Medicine

## 2014-01-31 NOTE — Telephone Encounter (Signed)
Last filled 12/28/13--last OV with you 12/14--please advise

## 2014-02-01 ENCOUNTER — Other Ambulatory Visit: Payer: Self-pay

## 2014-02-01 DIAGNOSIS — R519 Headache, unspecified: Secondary | ICD-10-CM

## 2014-02-01 DIAGNOSIS — R51 Headache: Principal | ICD-10-CM

## 2014-02-01 MED ORDER — HYDROCODONE-ACETAMINOPHEN 5-325 MG PO TABS: 1.0000 | ORAL_TABLET | Freq: Two times a day (BID) | ORAL | Status: DC

## 2014-02-03 ENCOUNTER — Other Ambulatory Visit: Payer: Self-pay | Admitting: Internal Medicine

## 2014-02-05 NOTE — Telephone Encounter (Signed)
Left detailed message on VM letting pt know her Rx for Ambien had been denied after a prior auth was done

## 2014-02-06 ENCOUNTER — Other Ambulatory Visit: Payer: Self-pay | Admitting: Internal Medicine

## 2014-02-06 NOTE — Telephone Encounter (Signed)
Last prescribed 11/14 with 2 refills--please advise

## 2014-02-07 ENCOUNTER — Other Ambulatory Visit: Payer: Self-pay | Admitting: Internal Medicine

## 2014-02-09 ENCOUNTER — Encounter: Payer: Self-pay | Admitting: Internal Medicine

## 2014-02-10 ENCOUNTER — Encounter: Payer: Self-pay | Admitting: Internal Medicine

## 2014-02-12 ENCOUNTER — Encounter: Payer: Self-pay | Admitting: Internal Medicine

## 2014-02-14 ENCOUNTER — Encounter: Payer: Self-pay | Admitting: Internal Medicine

## 2014-02-14 ENCOUNTER — Ambulatory Visit (INDEPENDENT_AMBULATORY_CARE_PROVIDER_SITE_OTHER)
Admission: RE | Admit: 2014-02-14 | Discharge: 2014-02-14 | Disposition: A | Discharge status: Home / Self Care | Payer: Federal, State, Local not specified - PPO | Source: Ambulatory Visit | Attending: Internal Medicine | Admitting: Internal Medicine

## 2014-02-14 ENCOUNTER — Ambulatory Visit (INDEPENDENT_AMBULATORY_CARE_PROVIDER_SITE_OTHER): Payer: Federal, State, Local not specified - PPO | Admitting: Internal Medicine

## 2014-02-14 VITALS — BP 110/78 | HR 109 | Temp 98.3°F | Wt 183.0 lb

## 2014-02-14 DIAGNOSIS — M549 Dorsalgia, unspecified: Secondary | ICD-10-CM

## 2014-02-14 NOTE — Progress Notes (Signed)
Pre visit review using our clinic review tool, if applicable. No additional management support is needed unless otherwise documented below in the visit note. 

## 2014-02-14 NOTE — Progress Notes (Signed)
Subjective:    Patient ID: Brenda Mcguire, female    DOB: 08-01-1961, 53 y.o.   MRN: [id number]  HPI  Pt presents to the clinic today with c/o mid back pain. She reports this started about 1 month ago. She is also performing PT stretches. She describes the pain as sharp. The pain did not radiates. She is taking Norco and Ibuprofen as well.  Review of Systems      Past Medical History  Diagnosis Date  . GERD (gastroesophageal reflux disease)   . Hyperlipidemia   . History of blood transfusion   . Seizures     ? pseudoseizures  . Chronic headaches   . Phlebitis   . History of colonic polyps   . Anxiety   . 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 ALPRAZolam (XANAX) 0.5 MG tablet TAKE 1 TABLET 3 TIMES A DAY AS NEEDED  90 tablet  0  . ALPRAZolam (XANAX) 0.5 MG tablet TAKE 1 TABLET BY MOUTH 3 TIMES A DAY AS NEEDED SLEEP/ANXIETY  90 tablet  0  . amitriptyline (ELAVIL) 25 MG tablet TAKE 1 TABLET (25 MG TOTAL) BY MOUTH AT BEDTIME.  30 tablet  0  . diphenhydrAMINE (BENADRYL) 25 mg capsule Take 1 capsule (25 mg total) by mouth every 8 (eight) hours as needed (with compazine as needed for headache).      . divalproex (DEPAKOTE) 250 MG DR tablet Take 3 tablets (750 mg total) by mouth 3 (three) times daily.  90 tablet  2  . gabapentin (NEURONTIN) 300 MG capsule TAKE 2 CAPSULES (600 MG TOTAL) BY MOUTH 3 (THREE) TIMES DAILY.  180 capsule  2  . gabapentin (NEURONTIN) 300 MG capsule TAKE 2 CAPSULES (600 MG TOTAL) BY MOUTH 3 (THREE) TIMES DAILY.  180 capsule  2  . HYDROcodone-acetaminophen (NORCO/VICODIN) 5-325 MG per tablet Take 1 tablet by mouth every 12 (twelve) hours.  60 tablet  0  . hydroxychloroquine (PLAQUENIL) 200 MG tablet Take 1 tablet (200 mg total) by mouth 2 (two) times daily.  60 tablet  3  . levETIRAcetam (KEPPRA) 1000 MG tablet Take 1 tablet (1,000 mg total) by mouth 2 (two) times daily.  60 tablet  0  .  lidocaine (XYLOCAINE) 2 % solution Use as directed 20 mLs in the mouth or throat as needed for mouth pain.  100 mL  0  . ondansetron (ZOFRAN-ODT) 4 MG disintegrating tablet Take 4 mg by mouth every 4 (four) hours as needed for nausea or vomiting.      . prochlorperazine (COMPAZINE) 10 MG tablet Take 1 tablet (10 mg total) by mouth every 8 (eight) hours as needed (take with benadryl as needed for migraine headache).  15 tablet  0  . promethazine (PHENERGAN) 25 MG tablet Take 1/2 Tablet by mouth every 6 hours as needed for nausea  15 tablet  0   No current facility-administered medications for this visit.    Allergies  Allergen Reactions  . Iohexol Anaphylaxis, Hives and Shortness Of Breath  . Codeine Itching  . Hepatitis B Vaccine   . 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 Other     CT Contrast    Family History  Problem Relation Age of Onset  . Diabetes Mother   . Hyperlipidemia Mother   . Stroke Mother   . Hypertension Mother   . Colon cancer Mother   .  Breast cancer Mother   . Early death Father   . Heart attack Father   . Heart disease Father   . Hyperlipidemia Father   . COPD Sister     History   Social History  . Marital Status: Married    Spouse Name: N/A    Number of Children: 2  . Years of Education: 14   Occupational History  . Nurse     [employer]  .     Social History Main Topics  . Smoking status: Never Smoker   . Smokeless tobacco: Never Used  . Alcohol Use: No  . Drug Use: No  . Sexual Activity: Yes    Birth Control/ Protection: Surgical   Other Topics Concern  . Not on file   Social History Narrative   Regular exercise-no   Caffeine Use-yes     Constitutional: Denies fever, malaise, fatigue, headache or abrupt weight changes.  Musculoskeletal: Pt reports mid back pain. Denies decrease in range of motion, difficulty with gait, or joint pain and swelling.    No other specific complaints in a complete review of systems  (except as listed in HPI above).  Objective:   Physical Exam   BP 110/78  Pulse 109  Temp(Src) 98.3 F (36.8 C) (Oral)  Wt 183 lb (83.008 kg)  SpO2 98% Wt Readings from Last 3 Encounters:  02/14/14 183 lb (83.008 kg)  11/30/13 190 lb (86.183 kg)  11/14/13 176 lb 12.8 oz (80.196 kg)    General: Appears her stated age, well developed, well nourished in NAD. Pulmonary/Chest: Normal effort and positive vesicular breath sounds. No respiratory distress. No wheezes, rales or ronchi noted.  Musculoskeletal: Normal flexion and extension of the back. Pinpoint tenderness along the thoracic spine. No difficulty with gait.    BMET    Component Value Date/Time   NA 142 11/13/2013 0540   K 3.7 11/13/2013 0540   CL 107 11/13/2013 0540   CO2 27 11/13/2013 0540   GLUCOSE 98 11/13/2013 0540   BUN 8 11/13/2013 0540   CREATININE 0.53 11/13/2013 0540   CALCIUM 9.1 11/13/2013 0540   GFRNONAA >90 11/13/2013 0540   GFRAA >90 11/13/2013 0540    Lipid Panel     Component Value Date/Time   CHOL 234* 03/02/2013 1115   TRIG 83.0 03/02/2013 1115   HDL 45.90 03/02/2013 1115   CHOLHDL 5 03/02/2013 1115   VLDL 16.6 03/02/2013 1115    CBC    Component Value Date/Time   WBC 4.8 11/13/2013 0540   RBC 3.48* 11/13/2013 0540   HGB 10.9* 11/13/2013 0540   HCT 30.7* 11/13/2013 0540   PLT 202 11/13/2013 0540   MCV 88.2 11/13/2013 0540   MCH 31.3 11/13/2013 0540   MCHC 35.5 11/13/2013 0540   RDW 11.8 11/13/2013 0540   LYMPHSABS 2.3 11/10/2013 1748   MONOABS 0.4 11/10/2013 1748   EOSABS 0.1 11/10/2013 1748   BASOSABS 0.0 11/10/2013 1748    Hgb A1C No results found for this basename: HGBA1C        Assessment & Plan:   Mid back pain:  Will obtain xray of thoracic spine May need to proceed with MRI Continue current medications and stretching exercises  RTC as needed or if symptoms persist or worsen

## 2014-02-14 NOTE — Patient Instructions (Addendum)
Back Exercises These exercises may help you when beginning to rehabilitate your injury. Your symptoms may resolve with or without further involvement from your physician, physical therapist or athletic trainer. While completing these exercises, remember:   Restoring tissue flexibility helps normal motion to return to the joints. This allows healthier, less painful movement and activity.  An effective stretch should be held for at least 30 seconds.  A stretch should never be painful. You should only feel a gentle lengthening or release in the stretched tissue. STRETCH  Extension, Prone on Elbows   Lie on your stomach on the floor, a bed will be too soft. Place your palms about shoulder width apart and at the height of your head.  Place your elbows under your shoulders. If this is too painful, stack pillows under your chest.  Allow your body to relax so that your hips drop lower and make contact more completely with the floor.  Hold this position for __________ seconds.  Slowly return to lying flat on the floor. Repeat __________ times. Complete this exercise __________ times per day.  RANGE OF MOTION  Extension, Prone Press Ups   Lie on your stomach on the floor, a bed will be too soft. Place your palms about shoulder width apart and at the height of your head.  Keeping your back as relaxed as possible, slowly straighten your elbows while keeping your hips on the floor. You may adjust the placement of your hands to maximize your comfort. As you gain motion, your hands will come more underneath your shoulders.  Hold this position __________ seconds.  Slowly return to lying flat on the floor. Repeat __________ times. Complete this exercise __________ times per day.  RANGE OF MOTION- Quadruped, Neutral Spine   Assume a hands and knees position on a firm surface. Keep your hands under your shoulders and your knees under your hips. You may place padding under your knees for comfort.  Drop  your head and point your tail bone toward the ground below you. This will round out your low back like an angry cat. Hold this position for __________ seconds.  Slowly lift your head and release your tail bone so that your back sags into a large arch, like an old horse.  Hold this position for __________ seconds.  Repeat this until you feel limber in your low back.  Now, find your "sweet spot." This will be the most comfortable position somewhere between the two previous positions. This is your neutral spine. Once you have found this position, tense your stomach muscles to support your low back.  Hold this position for __________ seconds. Repeat __________ times. Complete this exercise __________ times per day.  STRETCH  Flexion, Single Knee to Chest   Lie on a firm bed or floor with both legs extended in front of you.  Keeping one leg in contact with the floor, bring your opposite knee to your chest. Hold your leg in place by either grabbing behind your thigh or at your knee.  Pull until you feel a gentle stretch in your low back. Hold __________ seconds.  Slowly release your grasp and repeat the exercise with the opposite side. Repeat __________ times. Complete this exercise __________ times per day.  STRETCH - Hamstrings, Standing  Stand or sit and extend your right / left leg, placing your foot on a chair or foot stool  Keeping a slight arch in your low back and your hips straight forward.  Lead with your chest and   lean forward at the waist until you feel a gentle stretch in the back of your right / left knee or thigh. (When done correctly, this exercise requires leaning only a small distance.)  Hold this position for __________ seconds. Repeat __________ times. Complete this stretch __________ times per day. STRENGTHENING  Deep Abdominals, Pelvic Tilt   Lie on a firm bed or floor. Keeping your legs in front of you, bend your knees so they are both pointed toward the ceiling and  your feet are flat on the floor.  Tense your lower abdominal muscles to press your low back into the floor. This motion will rotate your pelvis so that your tail bone is scooping upwards rather than pointing at your feet or into the floor.  With a gentle tension and even breathing, hold this position for __________ seconds. Repeat __________ times. Complete this exercise __________ times per day.  STRENGTHENING  Abdominals, Crunches   Lie on a firm bed or floor. Keeping your legs in front of you, bend your knees so they are both pointed toward the ceiling and your feet are flat on the floor. Cross your arms over your chest.  Slightly tip your chin down without bending your neck.  Tense your abdominals and slowly lift your trunk high enough to just clear your shoulder blades. Lifting higher can put excessive stress on the low back and does not further strengthen your abdominal muscles.  Control your return to the starting position. Repeat __________ times. Complete this exercise __________ times per day.  STRENGTHENING  Quadruped, Opposite UE/LE Lift   Assume a hands and knees position on a firm surface. Keep your hands under your shoulders and your knees under your hips. You may place padding under your knees for comfort.  Find your neutral spine and gently tense your abdominal muscles so that you can maintain this position. Your shoulders and hips should form a rectangle that is parallel with the floor and is not twisted.  Keeping your trunk steady, lift your right hand no higher than your shoulder and then your left leg no higher than your hip. Make sure you are not holding your breath. Hold this position __________ seconds.  Continuing to keep your abdominal muscles tense and your back steady, slowly return to your starting position. Repeat with the opposite arm and leg. Repeat __________ times. Complete this exercise __________ times per day. Document Released: 12/18/2005 Document  Revised: 02/22/2012 Document Reviewed: 03/14/2009 [employer] Patient Information 2014 [employer]  

## 2014-02-15 ENCOUNTER — Encounter: Payer: Self-pay | Admitting: Internal Medicine

## 2014-02-16 ENCOUNTER — Other Ambulatory Visit: Payer: Self-pay | Admitting: Internal Medicine

## 2014-02-16 NOTE — Telephone Encounter (Signed)
Last filled 01/22/14--please advise 

## 2014-02-16 NOTE — Telephone Encounter (Signed)
[place]

## 2014-02-17 ENCOUNTER — Other Ambulatory Visit: Payer: Self-pay | Admitting: Internal Medicine

## 2014-02-17 ENCOUNTER — Encounter: Payer: Self-pay | Admitting: Internal Medicine

## 2014-02-19 ENCOUNTER — Other Ambulatory Visit: Payer: Self-pay | Admitting: Internal Medicine

## 2014-02-19 DIAGNOSIS — Z1239 Encounter for other screening for malignant neoplasm of breast: Secondary | ICD-10-CM

## 2014-02-19 MED ORDER — PROMETHAZINE HCL 25 MG PO TABS: ORAL_TABLET | ORAL | Status: DC

## 2014-02-19 NOTE — Telephone Encounter (Signed)
Rx called in to pharmacy. 

## 2014-02-26 ENCOUNTER — Encounter: Payer: Self-pay | Admitting: Internal Medicine

## 2014-02-27 ENCOUNTER — Other Ambulatory Visit: Payer: Self-pay | Admitting: Internal Medicine

## 2014-02-27 DIAGNOSIS — G8929 Other chronic pain: Secondary | ICD-10-CM

## 2014-02-27 DIAGNOSIS — R51 Headache: Principal | ICD-10-CM

## 2014-02-27 MED ORDER — HYDROCODONE-ACETAMINOPHEN 5-325 MG PO TABS: 1.0000 | ORAL_TABLET | Freq: Two times a day (BID) | ORAL | Status: DC

## 2014-02-28 ENCOUNTER — Other Ambulatory Visit: Payer: Self-pay | Admitting: Internal Medicine

## 2014-02-28 ENCOUNTER — Telehealth: Payer: Self-pay

## 2014-02-28 DIAGNOSIS — Z1231 Encounter for screening mammogram for malignant neoplasm of breast: Secondary | ICD-10-CM

## 2014-02-28 NOTE — Telephone Encounter (Signed)
Spoke to pt's husband and he is aware of Rx being ready to picked up

## 2014-03-06 ENCOUNTER — Encounter: Payer: Self-pay | Admitting: Internal Medicine

## 2014-03-07 ENCOUNTER — Encounter: Payer: Self-pay | Admitting: Internal Medicine

## 2014-03-11 ENCOUNTER — Encounter: Payer: Self-pay | Admitting: Internal Medicine

## 2014-03-12 ENCOUNTER — Other Ambulatory Visit: Payer: Self-pay | Admitting: Internal Medicine

## 2014-03-12 ENCOUNTER — Other Ambulatory Visit: Payer: Self-pay

## 2014-03-12 DIAGNOSIS — F411 Generalized anxiety disorder: Secondary | ICD-10-CM

## 2014-03-12 MED ORDER — ALPRAZOLAM 1 MG PO TABS: 1.0000 mg | ORAL_TABLET | Freq: Two times a day (BID) | ORAL | Status: DC | PRN

## 2014-03-12 NOTE — Telephone Encounter (Signed)
Rx called in to pharmacy. 

## 2014-03-13 ENCOUNTER — Encounter: Payer: Self-pay | Admitting: Internal Medicine

## 2014-03-13 ENCOUNTER — Other Ambulatory Visit: Payer: Self-pay | Admitting: Internal Medicine

## 2014-03-13 ENCOUNTER — Encounter: Payer: Self-pay | Admitting: Neurology

## 2014-03-13 ENCOUNTER — Ambulatory Visit (INDEPENDENT_AMBULATORY_CARE_PROVIDER_SITE_OTHER): Payer: Federal, State, Local not specified - PPO | Admitting: Neurology

## 2014-03-13 VITALS — BP 108/62 | HR 92 | Resp 18 | Ht 70.5 in | Wt 190.0 lb

## 2014-03-13 DIAGNOSIS — G40909 Epilepsy, unspecified, not intractable, without status epilepticus: Secondary | ICD-10-CM

## 2014-03-13 DIAGNOSIS — M352 Behcet's disease: Secondary | ICD-10-CM

## 2014-03-13 MED ORDER — HYDROXYCHLOROQUINE SULFATE 200 MG PO TABS: 200.0000 mg | ORAL_TABLET | Freq: Two times a day (BID) | ORAL | Status: DC

## 2014-03-13 NOTE — Progress Notes (Signed)
a [facility] Clinic Note - Initial Visit   Date: 03/13/2014   Brenda Mcguire MRN: [id number] DOB: [birth year]   Dear Dr Nicki Reaper, NP:  Thank you for your kind referral of Brenda Mcguire for consultation of headaches. Although her history is well known to you, please allow [place] to reiterate it for the purpose of our medical record. The patient was accompanied to the clinic by self who also provides collateral information.     History of Present Illness: Brenda Mcguire is a 53 y.o. right-handed Caucasian female with history of seizure disorder (2010), Behcet's disease (2002), GERD, and  hyperlipidemia presenting for evaluation of seizure disorder.    She had developed aseptic meningitis and developed retrobulbar uvieits and was diagnosed with Behcet's disease in 2002.  She had cytoxan in 2010. The same year, she had her first grand mal seizure and reports "waking up on a ventilator".  Since 2010, she reports having a total of 8 grand mal seizures, with the last one occurring in November 2014.  Observers who have witnessed the seizures, describe them as tonic-clonic movements, unresponsiveness.  Duration is 4-5 minutes with clustering lasting 15 minutes.  She is post-ictal for 2 hours.   She reports always "waking up on a ventilator".  The typical frequency of seizures is ~2 per year. Seizures are more frequent in the past two years.  She has not noticed any seizure triggers. There is history of tongue/cheek biting and history of urinary incontinence. The longest seizure-free interval has been 1 year. There is history of status epilepticus. The patient has aura which is described as sensation of slow motion.  She was initially started on dilatin, but could not function on this so then was switched to keppra 500mg  BID.  She was started on depakote 750mg  TID ~2 years ago.    She moved from [facility] and previously was seeing a neurologist in [place] for seizures  and headaches. She currently denies any headache, but reports in the past had frequent therapeutic lumbar punctures every 60 days due to elevated ICPs in 2004-2005. She scored Dr. Anne Hahn at [facility] in May 2014 for headaches at which time her prednisone dose pack was started. MRI brain and referral to rheumatology was ordered, however it was never completed.  Postictal symptoms include: generalized body aches, disorientated  Possible lateralizing signs by history: none  Birth and Early Development: none   RISK FACTORS FOR SEIZURES:   1. Head Trauma: none; 2. CNS Infections: yes - aseptic meningitis; 3. Family History of Seizures:  no; 4. Developmental Delay no; 5. Febrile Seizures - no; 6. CNS Tumors- no; 7. CNS Vascular Disease - yes Behcet's disease  CURRENT ANTICONVULSANTS: 1.  Keppra 1000mg  BID  (in November, she was increased from 500mg  BID to 1000mg  BID) 2.  Depakote 750mg  TID  The patient forgets a dose: never The patient's side effects to the current medications: none  PRIOR ANTICONVULSANT HISTORY: The following AEDS were associated with side effects:  Dilantin   Out-side paper records, electronic medical record, and images have been reviewed where available and summarized as:  CT head 11/10/2013:  No acute process  Component     Latest Ref Rng 11/10/2013  Valproic Acid Lvl     50.0 - 100.0 ug/mL 56.8   EEG 11/12/2013 EEG Abnormalities: 1) Two clinical events as described above. Both are associated with left sided rhythmic delta activity in  the setting of a background with excessive beta activity.   Clinical  Interpretation: This EEG recorded two events of clinical concern consisting of low amplitude shaking, the second with  bilateral flexion and initially right arm shaking. The artifact and excessive beta activity surrounding these events could obscure a subtle electrographic discharge, though no clear ictal discharge was seen. I cannot exclude seizure based on this study    but psychogenic spells are very much called into question.      Past Medical History  Diagnosis Date  . GERD (gastroesophageal reflux disease)   . Hyperlipidemia   . History of blood transfusion   . Seizures     ? pseudoseizures  . Chronic headaches   . Phlebitis   . History of colonic polyps   . Anxiety   . Behcet's disease     has port-a-cath    Past Surgical History  Procedure Laterality Date  . Cholecystectomy    . Tonsillectomy  1970  . Appendectomy  1975  . Abdominal hysterectomy  1988  . Lumbar fusion  1999    L5-S1     Medications:  Current Outpatient Prescriptions on File Prior to Visit  Medication Sig Dispense Refill  . ALPRAZolam (XANAX) 1 MG tablet Take 1 tablet (1 mg total) by mouth 2 (two) times daily as needed for anxiety.  60 tablet  0  . amitriptyline (ELAVIL) 25 MG tablet TAKE 1 TABLET (25 MG TOTAL) BY MOUTH AT BEDTIME.  30 tablet  0  . divalproex (DEPAKOTE) 250 MG DR tablet Take 3 tablets (750 mg total) by mouth 3 (three) times daily.  90 tablet  2  . gabapentin (NEURONTIN) 300 MG capsule TAKE 2 CAPSULES (600 MG TOTAL) BY MOUTH 3 (THREE) TIMES DAILY.  180 capsule  2  . HYDROcodone-acetaminophen (NORCO/VICODIN) 5-325 MG per tablet Take 1 tablet by mouth every 12 (twelve) hours.  60 tablet  0  . hydroxychloroquine (PLAQUENIL) 200 MG tablet Take 1 tablet (200 mg total) by mouth 2 (two) times daily.  60 tablet  3  . levETIRAcetam (KEPPRA) 1000 MG tablet Take 1 tablet (1,000 mg total) by mouth 2 (two) times daily.  60 tablet  0  . lidocaine (XYLOCAINE) 2 % solution Use as directed 20 mLs in the mouth or throat as needed for mouth pain.  100 mL  0  . promethazine (PHENERGAN) 25 MG tablet Take 1/2 Tablet by mouth every 6 hours as needed for nausea  15 tablet  0   No current facility-administered medications on file prior to visit.    Allergies:  Allergies  Allergen Reactions  . Iohexol Anaphylaxis, Hives and Shortness Of Breath  . Codeine Itching  .  Hepatitis B Vaccine   . Imitrex [Sumatriptan]     Makes her have svt    Family History: Family History  Problem Relation Age of Onset  . Diabetes Mother   . Hyperlipidemia Mother   . Stroke Mother   . Hypertension Mother   . Colon cancer Mother   . Breast cancer Mother   . Early death Father   . Heart attack Father   . Heart disease Father   . Hyperlipidemia Father   . COPD Sister     Social History: History   Social History  . Marital Status: Married    Spouse Name: N/A    Number of Children: 2  . Years of Education: 14   Occupational History  . Nurse     [employer]  .     Social History Main Topics  . Smoking  status: Never Smoker   . Smokeless tobacco: Never Used  . Alcohol Use: No  . Drug Use: No  . Sexual Activity: Yes    Birth Control/ Protection: Surgical   Other Topics Concern  . Not on file   Social History Narrative   Lives with husband.  They have 2 grown children.   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   EYES: +visual changes or eye pain ENT: No hearing changes.  No history of nose bleeds.   RESPIRATORY: No cough, wheezing and shortness of breath.   CARDIOVASCULAR: Negative for chest pain, and palpitations.   GI: Negative for abdominal discomfort, blood in stools or black stools.  No recent change in bowel habits.   GU:  No history of incontinence.   MUSCLOSKELETAL: No history of joint pain or swelling.  No myalgias.   SKIN: Negative for lesions, rash, and itching.   HEMATOLOGY/ONCOLOGY: Negative for prolonged bleeding, bruising easily, and swollen nodes.  ENDOCRINE: Negative for cold or heat intolerance, polydipsia or goiter.   PSYCH:  No depression or anxiety symptoms.   NEURO: As Above.   Vital Signs:  BP 108/62   Pulse 92  Resp 18  Ht 5' 10.5" (1.791 m)  Wt 190 lb (86.183 kg)  BMI 26.87 kg/m2  Neurological Exam: MENTAL STATUS including orientation to time, place, person, recent and remote memory, attention span and concentration, language, and fund of knowledge is normal.  Speech is not dysarthric.  CRANIAL NERVES: II:  No visual field defects.  Unremarkable fundi.   III-IV-VI: Pupils equal round and reactive to light.  Normal conjugate, extra-ocular eye movements in all directions of gaze.  No nystagmus.  No ptosis.   V:  Normal facial sensation.   VII:  Normal facial symmetry and movements.  No pathologic facial reflexes.  VIII:  Normal hearing and vestibular function.   IX-X:  Normal palatal movement.   XI:  Normal shoulder shrug and head rotation.   XII:  Normal tongue strength and range of motion, no deviation or fasciculation.  MOTOR:  No atrophy, fasciculations or abnormal movements.  No pronator drift.  Tone is normal.    Right Upper Extremity:    Left Upper Extremity:    Deltoid  5/5   Deltoid  5/5   Biceps  5/5   Biceps  5/5   Triceps  5/5   Triceps  5/5   Wrist extensors  5/5   Wrist extensors  5/5   Wrist flexors  5/5   Wrist flexors  5/5   Finger extensors  5/5   Finger extensors  5/5   Finger flexors  5/5   Finger flexors  5/5   Dorsal interossei  5/5   Dorsal interossei  5/5   Abductor pollicis  5/5   Abductor pollicis  5/5   Tone (Ashworth scale)  0  Tone (Ashworth scale)  0   Right Lower Extremity:    Left Lower Extremity:    Hip flexors  5/5   Hip flexors  5/5   Hip extensors  5/5   Hip extensors  5/5   Knee flexors  5/5   Knee flexors  5/5   Knee extensors  5/5   Knee extensors  5/5   Dorsiflexors  5/5   Dorsiflexors  5/5   Plantarflexors  5/5   Plantarflexors  5/5   Toe extensors  5/5   Toe extensors  5/5   Toe flexors  5/5   Toe flexors  5/5   Tone (Ashworth scale)  0  Tone (Ashworth scale)  0   MSRs:  Right                                                                  Left brachioradialis 2+  brachioradialis 2+  biceps 2+  biceps 2+  triceps 2+  triceps 2+  patellar 2+  patellar 2+  ankle jerk 0  ankle jerk 0  Hoffman no  Hoffman no  plantar response up  plantar response up   SENSORY:  Normal and symmetric perception of light touch, pinprick, vibration, and proprioception.  Romberg's sign absent.   COORDINATION/GAIT: Normal finger-to- nose-finger and heel-to-shin.  Intact rapid alternating movements bilaterally.  Able to rise from a chair without using arms.  Gait narrow based and stable. Stressed gait intact. She is unsteady with tandem gait   IMPRESSION: 1.  Seizure disorder manifesting with GTC (dx 2010)  - Last seizure in November 2014 with history of status epilepticus  - Risk factors: Behcet's disease, history of aseptic meningitis 2.  Behcet's disease  - not active, not on any immunomodulatory therapy  - not being followed by rheumatology   PLAN/RECOMMENDATIONS:  1.  Continue Keppra 1000mg  twice daily 2.  Continue depakote 750mg  three times daily 3.  Ambulatory 24-hr EEG 4.  Recommend eye evaluation 5.  Referral to rheumatology 6.  Discussed seizure precautions, including no driving for at least 652-month from last seizures (November 2014) 7.  Return to clinic in 443-months    The duration of this appointment visit was 50 minutes of face-to-face time with the patient.  Greater than 50% of this time was spent in counseling, explanation of diagnosis, planning of further management, and coordination of care.   Thank you for allowing me to participate in patient's care.  If I can answer any additional questions, I would be pleased to do so.    Sincerely,    Rebecah Dangerfield K. Allena KatzPatel, DO

## 2014-03-13 NOTE — Patient Instructions (Addendum)
1.  Continue Keppra 1000mg  BID 2.  Continue Depakote 750mg  BID 3.  We will contact you regarding ambulatory EEG  4.  Please forward my office your previous evaluation, including MRI brain 5.  Recommend eye evaluation 6.  Referral to rheumatology 7.  Return to clinic in 263-months   SEIZURE PRECAUTIONS  Bathroom Safety  A person with seizures may want to shower instead of bathe to avoid accidental drowning. If falls occur during the patient's typical seizure, a person should use a shower seat, preferably one with a safety strap.    Use nonskid strips in your shower or tub.    Never use electrical equipment near water. This prevents accidental electrocution.    Consider changing glass in shower doors to shatterproof glass.   Secondary school teacherKitchen Safety   If possible, cook when someone else is nearby.    Use the back burners of the stove to prevent accidental burns.    Use shatterproof containers as much as possible. For instance, sauces can be transferred from glass bottles to plastic containers for use.    Limit time that is required using knives or other sharp objects. If possible, buy foods that are already cut, or ask someone to help in meal preparation.   General Safety at Home   Do not smoke or light fires in the fireplace unless someone else is present.    Do not use space heaters that can be accidentally overturned.    When alone, avoid using step stools or ladders, and do not clean rooftop gutters.    Purchase power tools and motorized Risk managerlawn equipment which have a safety switch that will stop the machine if you release the handle (a 'dead man's' switch).   Driving and Transportation   Avoid driving unless your seizures are well controlled and/or you have permission to drive from your state's Department of Motor Vehicles  [facility](DMV). Each state has different laws. Please refer to the following link on the Epilepsy Foundation of America's website for more information:  http://www.epilepsyfoundation.org/answerplace/Social/driving/drivingu.cfm    If you ride a bicycle, wear a helmet and any other necessary protective gear.    When taking public transportation like the bus or subway, stay clear of the platform edge.   Outdoor Product/process development scientistand Sports Safety   Swimming is okay, but does present certain risks. Never swim alone, and tell friends what to do if you have a seizure while swimming.    Wear appropriate protective equipment.    Ski with a friend. If a seizure occurs, your friend can seek help, if needed. He or she can also help to get you out of the cold. Consider using a safety hook or belt while riding the ski lift.   Safety Issues for Parents   Feed, nurse, dress, and change your infant while sitting on the floor, in a well-protected area.    Childproof your house as much as possible. If you are home alone with your child, consider using a safe play area or playpen. Use child safety gates to prevent a child from falling down stairs or to prevent your child from wandering in the event that you have a seizure.    As your child grows, explain what seizures are in terms that he or she can understand. Some people perform 'seizure drills.' Many people teach their children how to call 911 in an emergency.

## 2014-03-14 ENCOUNTER — Other Ambulatory Visit: Payer: Self-pay | Admitting: *Deleted

## 2014-03-14 ENCOUNTER — Inpatient Hospital Stay (HOSPITAL_COMMUNITY)
Admission: EM | Admit: 2014-03-14 | Discharge: 2014-03-16 | DRG: 101 | Disposition: A | Discharge status: Home / Self Care | Payer: Federal, State, Local not specified - PPO | Attending: Internal Medicine | Admitting: Internal Medicine

## 2014-03-14 ENCOUNTER — Telehealth: Payer: Self-pay | Admitting: Neurology

## 2014-03-14 DIAGNOSIS — G40401 Other generalized epilepsy and epileptic syndromes, not intractable, with status epilepticus: Principal | ICD-10-CM | POA: Diagnosis present

## 2014-03-14 DIAGNOSIS — G4700 Insomnia, unspecified: Secondary | ICD-10-CM

## 2014-03-14 DIAGNOSIS — Z888 Allergy status to other drugs, medicaments and biological substances status: Secondary | ICD-10-CM

## 2014-03-14 DIAGNOSIS — K219 Gastro-esophageal reflux disease without esophagitis: Secondary | ICD-10-CM

## 2014-03-14 DIAGNOSIS — G40901 Epilepsy, unspecified, not intractable, with status epilepticus: Secondary | ICD-10-CM

## 2014-03-14 DIAGNOSIS — M352 Behcet's disease: Secondary | ICD-10-CM

## 2014-03-14 DIAGNOSIS — G43909 Migraine, unspecified, not intractable, without status migrainosus: Secondary | ICD-10-CM | POA: Diagnosis present

## 2014-03-14 DIAGNOSIS — Z765 Malingerer [conscious simulation]: Secondary | ICD-10-CM

## 2014-03-14 DIAGNOSIS — F411 Generalized anxiety disorder: Secondary | ICD-10-CM | POA: Diagnosis present

## 2014-03-14 DIAGNOSIS — H532 Diplopia: Secondary | ICD-10-CM | POA: Diagnosis present

## 2014-03-14 DIAGNOSIS — Z79899 Other long term (current) drug therapy: Secondary | ICD-10-CM

## 2014-03-14 DIAGNOSIS — R569 Unspecified convulsions: Secondary | ICD-10-CM

## 2014-03-14 DIAGNOSIS — D72829 Elevated white blood cell count, unspecified: Secondary | ICD-10-CM | POA: Diagnosis present

## 2014-03-14 DIAGNOSIS — F445 Conversion disorder with seizures or convulsions: Secondary | ICD-10-CM

## 2014-03-14 DIAGNOSIS — E785 Hyperlipidemia, unspecified: Secondary | ICD-10-CM | POA: Diagnosis present

## 2014-03-14 DIAGNOSIS — Z885 Allergy status to narcotic agent status: Secondary | ICD-10-CM

## 2014-03-14 DIAGNOSIS — Z981 Arthrodesis status: Secondary | ICD-10-CM

## 2014-03-14 HISTORY — DX: Other generalized epilepsy and epileptic syndromes, not intractable, without status epilepticus: G40.409

## 2014-03-14 NOTE — Telephone Encounter (Signed)
She had a seizure lasting 5-minute witness seizure with generalized shaking, loss of consciousness, and urinary incontinence.  Called patient and instructed her to go to the ED if she is not back to baseline, which she reports feeling post-ictal still, but sounded coherent on the phone.  I recommended that she increase her keppra to 1500mg  BID and continue taking depakote 750mg  TID.  I will request that my colleague, Dr. Karel JarvisAquino, will see her in follow-up for ongoing seizure management.  Aditri Louischarles K. Allena KatzPatel, DO

## 2014-03-14 NOTE — ED Provider Notes (Signed)
CSN: [id number]     Arrival date & time 03/14/14  2335 History   First MD Initiated Contact with Patient 03/14/14 2343     No chief complaint on file.    (Consider location/radiation/quality/duration/timing/severity/associated sxs/prior Treatment) HPI Comments: Patient is a 53 year old female with history of seizure disorder, anxiety, and Behcet's disease. She is brought by EMS for apparent seizure that occurred at home. She was given Versed by EMS, however has had recurrent shaking activity during transport. According to her husband, she has been intubated in the past for airway protection due to seizures. He also reports that she has been under a great deal of stress as her mother is apparently critically ill. Her husband states that whenever she becomes upset chance of having these seizures.  The history is provided by the patient.    Past Medical History  Diagnosis Date  . GERD (gastroesophageal reflux disease)   . Hyperlipidemia   . History of blood transfusion   . Seizures     ? pseudoseizures  . Chronic headaches   . Phlebitis   . History of colonic polyps   . Anxiety   . Behcet's disease     has port-a-cath   Past Surgical History  Procedure Laterality Date  . Cholecystectomy    . Tonsillectomy  1970  . Appendectomy  1975  . Abdominal hysterectomy  1988  . Lumbar fusion  1999    L5-S1   Family History  Problem Relation Age of Onset  . Diabetes Mother   . Hyperlipidemia Mother   . Stroke Mother   . Hypertension Mother   . Colon cancer Mother   . Breast cancer Mother   . Early death Father   . Heart attack Father   . Heart disease Father   . Hyperlipidemia Father   . COPD Sister    History  Substance Use Topics  . Smoking status: Never Smoker   . Smokeless tobacco: Never Used  . Alcohol Use: No   OB History   Grav Para Term Preterm Abortions TAB SAB Ect Mult Living                 Review of Systems  All other systems reviewed and are  negative.      Allergies  Iohexol; Codeine; Hepatitis b vaccine; and Imitrex  Home Medications   Current Outpatient Rx  Name  Route  Sig  Dispense  Refill  . ALPRAZolam (XANAX) 1 MG tablet   Oral   Take 1 tablet (1 mg total) by mouth 2 (two) times daily as needed for anxiety.   60 tablet   0   . amitriptyline (ELAVIL) 25 MG tablet      TAKE 1 TABLET (25 MG TOTAL) BY MOUTH AT BEDTIME.   30 tablet   0   . divalproex (DEPAKOTE) 250 MG DR tablet   Oral   Take 3 tablets (750 mg total) by mouth 3 (three) times daily.   90 tablet   2   . gabapentin (NEURONTIN) 300 MG capsule      TAKE 2 CAPSULES (600 MG TOTAL) BY MOUTH 3 (THREE) TIMES DAILY.   180 capsule   2   . HYDROcodone-acetaminophen (NORCO/VICODIN) 5-325 MG per tablet   Oral   Take 1 tablet by mouth every 12 (twelve) hours.   60 tablet   0   . hydroxychloroquine (PLAQUENIL) 200 MG tablet   Oral   Take 1 tablet (200 mg total) by mouth 2 (two)  times daily.   60 tablet   3   . levETIRAcetam (KEPPRA) 1000 MG tablet   Oral   Take 1 tablet (1,000 mg total) by mouth 2 (two) times daily.   60 tablet   0   . lidocaine (XYLOCAINE) 2 % solution   Mouth/Throat   Use as directed 20 mLs in the mouth or throat as needed for mouth pain.   100 mL   0   . promethazine (PHENERGAN) 25 MG tablet      Take 1/2 Tablet by mouth every 6 hours as needed for nausea   15 tablet   0    There were no vitals taken for this visit. Physical Exam  Nursing note and vitals reviewed. Constitutional: She is oriented to person, place, and time. She appears well-developed and well-nourished. No distress.  HENT:  Head: Normocephalic and atraumatic.  Mouth/Throat: Oropharynx is clear and moist.  Eyes: EOM are normal. Pupils are equal, round, and reactive to light.  Neck: Normal range of motion. Neck supple.  Cardiovascular: Normal rate, regular rhythm and normal heart sounds.   No murmur heard. Pulmonary/Chest: Effort normal and  breath sounds normal. No respiratory distress. She has no wheezes.  Abdominal: Soft. Bowel sounds are normal. She exhibits no distension. There is no tenderness.  Musculoskeletal: Normal range of motion. She exhibits no edema.  Neurological: She is alert and oriented to person, place, and time. No cranial nerve deficit.  Thorough neuro exam is difficult secondary to patient's shaking movements. She does move all extremities and follows commands. She is conscious and conversive during these seizure episodes.  Skin: Skin is warm and dry. She is not diaphoretic.    ED Course  Procedures (including critical care time) Labs Review Labs Reviewed  CBC WITH DIFFERENTIAL  COMPREHENSIVE METABOLIC PANEL  VALPROIC ACID LEVEL   Imaging Review No results found.   EKG Interpretation   Date/Time:  Wednesday March 14 2014 23:49:00 EDT Ventricular Rate:  116 PR Interval:  133 QRS Duration: 89 QT Interval:  349 QTC Calculation: 485 R Axis:   -30 Text Interpretation:  Sinus tachycardia Left ventricular hypertrophy  Anterior Q waves, possibly due to LVH Confirmed by DELOS  MD, Homer Miller  ([id number]) on 03/15/2014 5:00:15 AM      MDM   Final diagnoses:  None    Patient is a 53 year old female with history of seizure disorder. She presents with seizure activity that began this evening. She has a history of having required intubation for airway management during prolonged seizure activity. He was given Versed by EMS however continues to shake. She was given an additional dose of Versed in the ER followed by another dose of Ativan. Workup reveals an elevated white count of 13,000, however CT of the head is unremarkable and Depakote level is super therapeutic at 132. She was evaluated by Dr. Amada JupiterKirkpatrick who is familiar with her. He is recommending admission to the intensive care unit do to her prior history. Patient evaluated by critical care and will be admitted to the intensive care service. He is  recommended loading with Keppra which is in process. She has been hypotensive while in the emergency department and has required resuscitation with IV fluids.  CRITICAL CARE Performed by: Geoffery LyonseLo, Deeann Servidio Total critical care time: 45 minutes Critical care time was exclusive of separately billable procedures and treating other patients. Critical care was necessary to treat or prevent imminent or life-threatening deterioration. Critical care was time spent personally by me on the  following activities: development of treatment plan with patient and/or surrogate as well as nursing, discussions with consultants, evaluation of patient's response to treatment, examination of patient, obtaining history from patient or surrogate, ordering and performing treatments and interventions, ordering and review of laboratory studies, ordering and review of radiographic studies, pulse oximetry and re-evaluation of patient's condition.     Geoffery Lyons, MD 03/15/14 [phone]

## 2014-03-14 NOTE — Telephone Encounter (Signed)
Please advise 

## 2014-03-14 NOTE — Telephone Encounter (Signed)
Pt called state she had a seizure and needs to know what to do please call

## 2014-03-14 NOTE — ED Notes (Signed)
Patient presents to ED via [facility]. Patient family member called EMS due to "witnessed seizure." Upon EMS arrival patient was found to be actively having a seizure for approx 5 minutes. EMS administered 5 mg of Versed. Patient did not respond to painful stimuli while in route. Upon arrival to ED patient had a 2nd witnessed seizure and another 5mg  of Versed was administered by EMS. Patient verbalizing "I cant breathe." According to family member at bedside patient has a "seizure disorder known as Behcets."

## 2014-03-14 NOTE — ED Notes (Signed)
Husband at bedside.  Stating it has been since Thanksgiving since she had a presentation similar to tonight.  Husband sts pt has been under a lot of stress lately.

## 2014-03-15 ENCOUNTER — Encounter (HOSPITAL_COMMUNITY): Payer: Self-pay | Admitting: Emergency Medicine

## 2014-03-15 ENCOUNTER — Emergency Department (HOSPITAL_COMMUNITY): Payer: Federal, State, Local not specified - PPO

## 2014-03-15 ENCOUNTER — Inpatient Hospital Stay (HOSPITAL_COMMUNITY): Payer: Federal, State, Local not specified - PPO

## 2014-03-15 DIAGNOSIS — G40401 Other generalized epilepsy and epileptic syndromes, not intractable, with status epilepticus: Principal | ICD-10-CM

## 2014-03-15 DIAGNOSIS — Z765 Malingerer [conscious simulation]: Secondary | ICD-10-CM

## 2014-03-15 DIAGNOSIS — F445 Conversion disorder with seizures or convulsions: Secondary | ICD-10-CM | POA: Diagnosis present

## 2014-03-15 DIAGNOSIS — R569 Unspecified convulsions: Secondary | ICD-10-CM

## 2014-03-15 LAB — COMPREHENSIVE METABOLIC PANEL
ALK PHOS: 104 U/L (ref 39–117)
ALT: 15 U/L (ref 0–35)
AST: 18 U/L (ref 0–37)
Albumin: 3.5 g/dL (ref 3.5–5.2)
BUN: 18 mg/dL (ref 6–23)
CHLORIDE: 102 meq/L (ref 96–112)
CO2: 23 meq/L (ref 19–32)
Calcium: 9.3 mg/dL (ref 8.4–10.5)
Creatinine, Ser: 0.82 mg/dL (ref 0.50–1.10)
GFR, EST NON AFRICAN AMERICAN: 82 mL/min — AB (ref >90)
GLUCOSE: 123 mg/dL — AB (ref 70–99)
POTASSIUM: 4.6 meq/L (ref 3.7–5.3)
SODIUM: 140 meq/L (ref 137–147)
Total Bilirubin: <0.2 mg/dL — ABNORMAL LOW (ref 0.3–1.2)
Total Protein: 7.1 g/dL (ref 6.0–8.3)

## 2014-03-15 LAB — RAPID URINE DRUG SCREEN, HOSP PERFORMED
Amphetamines: NOT DETECTED
Barbiturates: NOT DETECTED
Benzodiazepines: POSITIVE — AB
COCAINE: NOT DETECTED
OPIATES: NOT DETECTED
Tetrahydrocannabinol: NOT DETECTED

## 2014-03-15 LAB — BASIC METABOLIC PANEL
BUN: 13 mg/dL (ref 6–23)
CO2: 25 meq/L (ref 19–32)
Calcium: 8.9 mg/dL (ref 8.4–10.5)
Chloride: 106 mEq/L (ref 96–112)
Creatinine, Ser: 0.72 mg/dL (ref 0.50–1.10)
GFR calc Af Amer: >90 mL/min (ref >90)
GFR calc non Af Amer: >90 mL/min (ref >90)
Glucose, Bld: 90 mg/dL (ref 70–99)
POTASSIUM: 4.4 meq/L (ref 3.7–5.3)
SODIUM: 144 meq/L (ref 137–147)

## 2014-03-15 LAB — MRSA PCR SCREENING: MRSA by PCR: NEGATIVE

## 2014-03-15 LAB — CBC WITH DIFFERENTIAL/PLATELET
BASOS ABS: 0 10*3/uL (ref 0.0–0.1)
Basophils Relative: 0 % (ref 0–1)
EOS PCT: 1 % (ref 0–5)
Eosinophils Absolute: 0.1 10*3/uL (ref 0.0–0.7)
HCT: 36.1 % (ref 36.0–46.0)
Hemoglobin: 12.3 g/dL (ref 12.0–15.0)
LYMPHS PCT: 18 % (ref 12–46)
Lymphs Abs: 2.4 10*3/uL (ref 0.7–4.0)
MCH: 31 pg (ref 26.0–34.0)
MCHC: 34.1 g/dL (ref 30.0–36.0)
MCV: 90.9 fL (ref 78.0–100.0)
Monocytes Absolute: 0.8 10*3/uL (ref 0.1–1.0)
Monocytes Relative: 6 % (ref 3–12)
Neutro Abs: 9.9 10*3/uL — ABNORMAL HIGH (ref 1.7–7.7)
Neutrophils Relative %: 75 % (ref 43–77)
PLATELETS: 254 10*3/uL (ref 150–400)
RBC: 3.97 MIL/uL (ref 3.87–5.11)
RDW: 12.5 % (ref 11.5–15.5)
WBC: 13.2 10*3/uL — ABNORMAL HIGH (ref 4.0–10.5)

## 2014-03-15 LAB — CBC
HCT: 33.4 % — ABNORMAL LOW (ref 36.0–46.0)
Hemoglobin: 11.3 g/dL — ABNORMAL LOW (ref 12.0–15.0)
MCH: 31 pg (ref 26.0–34.0)
MCHC: 33.8 g/dL (ref 30.0–36.0)
MCV: 91.8 fL (ref 78.0–100.0)
PLATELETS: 217 10*3/uL (ref 150–400)
RBC: 3.64 MIL/uL — AB (ref 3.87–5.11)
RDW: 12.6 % (ref 11.5–15.5)
WBC: 9.2 10*3/uL (ref 4.0–10.5)

## 2014-03-15 LAB — PROTIME-INR
INR: 0.95 (ref 0.00–1.49)
PROTHROMBIN TIME: 12.5 s (ref 11.6–15.2)

## 2014-03-15 LAB — MAGNESIUM: Magnesium: 2 mg/dL (ref 1.5–2.5)

## 2014-03-15 LAB — VALPROIC ACID LEVEL
VALPROIC ACID LVL: 132.4 ug/mL — AB (ref 50.0–100.0)
Valproic Acid Lvl: 97.1 ug/mL (ref 50.0–100.0)

## 2014-03-15 LAB — TROPONIN I: Troponin I: <0.3 ng/mL (ref <0.30)

## 2014-03-15 LAB — APTT: APTT: 28 s (ref 24–37)

## 2014-03-15 LAB — I-STAT CG4 LACTIC ACID, ED: Lactic Acid, Venous: 1.5 mmol/L (ref 0.5–2.2)

## 2014-03-15 LAB — TSH: TSH: 0.744 u[IU]/mL (ref 0.350–4.500)

## 2014-03-15 MED ORDER — BIOTENE DRY MOUTH MT LIQD
15.0000 mL | Freq: Two times a day (BID) | OROMUCOSAL | Status: DC
  Administered 2014-03-15 (×2): 15 mL via OROMUCOSAL

## 2014-03-15 MED ORDER — SODIUM CHLORIDE 0.9 % IV SOLN
1500.0000 mg | Freq: Two times a day (BID) | INTRAVENOUS | Status: DC
  Administered 2014-03-15 – 2014-03-16 (×3): 1500 mg via INTRAVENOUS
  Filled 2014-03-15 (×6): qty 15

## 2014-03-15 MED ORDER — SODIUM CHLORIDE 0.9 % IV SOLN
250.0000 mL | INTRAVENOUS | Status: DC | PRN
Start: 2014-03-15 — End: 2014-03-16

## 2014-03-15 MED ORDER — SODIUM CHLORIDE 0.9 % IJ SOLN
10.0000 mL | Freq: Two times a day (BID) | INTRAMUSCULAR | Status: DC
  Administered 2014-03-15: 10 mL

## 2014-03-15 MED ORDER — SODIUM CHLORIDE 0.9 % IJ SOLN
10.0000 mL | INTRAMUSCULAR | Status: DC | PRN
  Administered 2014-03-16: 10 mL

## 2014-03-15 MED ORDER — ALTEPLASE 2 MG IJ SOLR
2.0000 mg | Freq: Once | INTRAMUSCULAR | Status: AC
  Administered 2014-03-15: 2 mg
  Filled 2014-03-15: qty 2

## 2014-03-15 MED ORDER — KETOROLAC TROMETHAMINE 30 MG/ML IJ SOLN
30.0000 mg | Freq: Once | INTRAMUSCULAR | Status: AC
  Administered 2014-03-15: 30 mg via INTRAVENOUS
  Filled 2014-03-15: qty 1

## 2014-03-15 MED ORDER — SODIUM CHLORIDE 0.9 % IV SOLN
INTRAVENOUS | Status: DC
  Administered 2014-03-15: 04:00:00 via INTRAVENOUS

## 2014-03-15 MED ORDER — LORAZEPAM 2 MG/ML IJ SOLN
2.0000 mg | Freq: Once | INTRAMUSCULAR | Status: AC
  Administered 2014-03-15: 2 mg via INTRAVENOUS
  Filled 2014-03-15: qty 1

## 2014-03-15 MED ORDER — SODIUM CHLORIDE 0.9 % IV SOLN
1000.0000 mg | Freq: Once | INTRAVENOUS | Status: AC
  Administered 2014-03-15: 1000 mg via INTRAVENOUS
  Filled 2014-03-15: qty 10

## 2014-03-15 MED ORDER — HEPARIN SODIUM (PORCINE) 5000 UNIT/ML IJ SOLN
5000.0000 [IU] | Freq: Three times a day (TID) | INTRAMUSCULAR | Status: DC
  Administered 2014-03-15 – 2014-03-16 (×4): 5000 [IU] via SUBCUTANEOUS
  Filled 2014-03-15 (×7): qty 1

## 2014-03-15 MED ORDER — CHLORHEXIDINE GLUCONATE 0.12 % MT SOLN
15.0000 mL | Freq: Two times a day (BID) | OROMUCOSAL | Status: DC
  Administered 2014-03-15: 15 mL via OROMUCOSAL
  Filled 2014-03-15: qty 15

## 2014-03-15 MED ORDER — METOCLOPRAMIDE HCL 5 MG/ML IJ SOLN
10.0000 mg | Freq: Once | INTRAMUSCULAR | Status: AC
  Administered 2014-03-15: 10 mg via INTRAVENOUS
  Filled 2014-03-15: qty 2

## 2014-03-15 MED ORDER — ONDANSETRON 4 MG PO TBDP
4.0000 mg | ORAL_TABLET | Freq: Once | ORAL | Status: AC
  Administered 2014-03-15: 4 mg via ORAL
  Filled 2014-03-15: qty 1

## 2014-03-15 NOTE — Procedures (Signed)
ELECTROENCEPHALOGRAM REPORT  Patient: Brenda Mcguire       Room #: 1O102M14 EEG No. ID: [id number] Age: 53 y.o.        Sex: female Referring Physician: ZUBELEVITSKIY  Report Date:  03/15/2014        Interpreting Physician: Aline BrochureSTEWART,Conita Amenta R  History: Brenda MannanWanda Yaffe is an 53 y.o. female with a history of recurrent events concerning for seizure activity. Previous EEG studies have shown runs of irregular delta activity during her apparent characteristic spells. No frank epileptic activity has been correlated with her spells however.  Indications for study:   Rule out seizure activity.  Technique: This is an 18 channel routine scalp EEG performed at the bedside with bipolar and monopolar montages arranged in accordance to the international 10/20 system of electrode placement.   Description: This EEG recording was performed during wakefulness. Background activity consisted of low amplitude 1-2 Hz diffuse delta activity with superimposed irregular faster rhythms symmetrically. There were frequent occurrences of slightly higher amplitude diffuse delta activity lasting several seconds. There was no clear correlation with a change in behavior or mental status. Patient also had an episode of shaking of her left upper extremity as well as head turning and inattentiveness with no change in background cerebral activity. Photic stimulation produced symmetrical occipital driving response. Hyperventilation was not performed. No frank epileptiform activity was recorded.   Interpretation:  This EEG was abnormal with mild generalized nonspecific slowing of cerebral activity. There were occurrences of intermittent delta activity is seen on previous EEG recordings. Significance is unclear. There is no clear evidence of seizure activity, including during a spell of apparent focal seizure-like clinical activity.    Venetia MaxonR Laryah Neuser M.D. [facility] [phone]

## 2014-03-15 NOTE — ED Notes (Signed)
Attempted report to floor RN

## 2014-03-15 NOTE — ED Notes (Signed)
Updated nurse regarding pt blood pressure being low.  Advised fluids increased to bolus at rate of 9199mL/hr.

## 2014-03-15 NOTE — ED Notes (Signed)
Husband update on the phone regarding pt condition.

## 2014-03-15 NOTE — H&P (Signed)
PULMONARY / CRITICAL CARE MEDICINE   Name: Brenda Mcguire MRN: [id number] DOB: [birth year]    ADMISSION DATE:  03/14/2014 CONSULTATION DATE:  03/15/2014  REFERRING MD :  Nicki Reaper, NP PRIMARY SERVICE: PCCM  CHIEF COMPLAINT:  seizure  BRIEF PATIENT DESCRIPTION: 53 year old with PMH of seizure disorder and Behcet's disease.  She is followed by Dr. Nita Sickle (neurologist) for her seizure disorder and last visit was on 03/13/14 at which time.  She reports compliance with Keppra and Dilantin.  Last seizure Nov 2014. She is known to neurology.   SIGNIFICANT EVENTS / STUDIES:  03/15/14 CT head without contrast - unremarkable noncontrast CT of the head.  LINES / TUBES: 2010 port-a-cath  CULTURES: 03/15/14 Blood culture x 2  03/15/14 Lactic acid  ANTIBIOTICS: none  HISTORY OF PRESENT ILLNESS:  53 year old woman with a PMH of seizure disorder (on Dilantin and Keppra, followed by Dr. Nita Sickle) and Behcet's disease.  She was noted by family member to have had witnessed seizure at home.  EMS was called and noted seizure activity upon their arrival.  Patient given 5mg  Versed x 2 prior to ED arrival.  She received 2mg  Ativan after witnessed seizure in ED.  The patient is moderately sedate but able to provide some history.  She says she has noted double vision today and felt like everything was in "slow motion" prior to having her seizure.  She denies chest pain, palpitations.  She reports compliance with medications.  She denies new medications, EtOH or drug use.  She has history of migraine but denies recent headache.   PAST MEDICAL HISTORY :  Past Medical History  Diagnosis Date  . GERD (gastroesophageal reflux disease)   . Hyperlipidemia   . History of blood transfusion   . Seizures     ? pseudoseizures  . Chronic headaches   . Phlebitis   . History of colonic polyps   . Anxiety   . Behcet's disease     has port-a-cath   Past Surgical History  Procedure Laterality Date  .  Cholecystectomy    . Tonsillectomy  1970  . Appendectomy  1975  . Abdominal hysterectomy  1988  . Lumbar fusion  1999    L5-S1   Prior to Admission medications   Medication Sig Start Date End Date Taking? Authorizing Provider  ALPRAZolam Prudy Feeler) 1 MG tablet Take 1 tablet (1 mg total) by mouth 2 (two) times daily as needed for anxiety. 03/12/14   Nicki Reaper, NP  amitriptyline (ELAVIL) 25 MG tablet TAKE 1 TABLET (25 MG TOTAL) BY MOUTH AT BEDTIME.    Nicki Reaper, NP  divalproex (DEPAKOTE) 250 MG DR tablet Take 3 tablets (750 mg total) by mouth 3 (three) times daily. 11/01/13   Nicki Reaper, NP  gabapentin (NEURONTIN) 300 MG capsule TAKE 2 CAPSULES (600 MG TOTAL) BY MOUTH 3 (THREE) TIMES DAILY.    Nicki Reaper, NP  HYDROcodone-acetaminophen (NORCO/VICODIN) 5-325 MG per tablet Take 1 tablet by mouth every 12 (twelve) hours. 02/27/14   Nicki Reaper, NP  hydroxychloroquine (PLAQUENIL) 200 MG tablet Take 1 tablet (200 mg total) by mouth 2 (two) times daily. 03/13/14   Nicki Reaper, NP  levETIRAcetam (KEPPRA) 1000 MG tablet Take 1 tablet (1,000 mg total) by mouth 2 (two) times daily. 11/15/13   Bernadene Person, NP  lidocaine (XYLOCAINE) 2 % solution Use as directed 20 mLs in the mouth or throat as needed for mouth pain. 12/18/13   Nicki Reaper, NP  promethazine (PHENERGAN) 25 MG tablet Take 1/2 Tablet by mouth every 6 hours as needed for nausea 02/19/14   Nicki Reaper, NP   Allergies  Allergen Reactions  . Iohexol Anaphylaxis, Hives and Shortness Of Breath  . Codeine Itching  . Hepatitis B Vaccine   . Imitrex [Sumatriptan]     Makes her have svt    FAMILY HISTORY:  Family History  Problem Relation Age of Onset  . Diabetes Mother   . Hyperlipidemia Mother   . Stroke Mother   . Hypertension Mother   . Colon cancer Mother   . Breast cancer Mother   . Early death Father   . Heart attack Father   . Heart disease Father   . Hyperlipidemia Father   . COPD Sister    SOCIAL HISTORY:   reports that she has never smoked. She has never used smokeless tobacco. She reports that she does not drink alcohol or use illicit drugs.  REVIEW OF SYSTEMS:  Cardiopulmonary:  Denies chest pain, palpitations or dyspnea GI:  Denies decreased appetite, N/V or abdominal pain GU:  Denies dysuria, frequency Neuro:  + binocular horizontal diplopia, + dizziness; denies unilateral weakness or unsteady gait ID:  Denies recent illness.   VITAL SIGNS: Temp:  [98.5 F (36.9 C)] 98.5 F (36.9 C) (04/02 0337) Pulse Rate:  [82-110] 82 (04/02 0337) Resp:  [17-23] 17 (04/02 0337) BP: (90-107)/(52-75) 90/53 mmHg (04/02 0337) SpO2:  [96 %-100 %] 99 % (04/02 0337) HEMODYNAMICS:  N/A  VENTILATOR SETTINGS:  N/A  INTAKE / OUTPUT: Intake/Output   None     PHYSICAL EXAMINATION: General:  NAD.  Sedate but able to open eyes and responding slowly but appropriately to questions.  Neuro:  CN 2, 3 - PERRL              CN 3,4,6 - horizontal binocular diplopia              CN 5 - facial sensation and strength intact              CN 7 - smile and blink intact              CN 8 - hearing intact              CN 9, 10 - diminished gag reflex, good cough, no dysarthria              CN 11 - shoulder strength intact              CN 12 - tongue movements intact, no dysarthria              Motor - 5/5 strength bilateral upper and lower extremities; normal tone; able to move all four extremities independently.              Sensation intact to light touch.              Coordination - rapid alternating movement, finger-to-nose and heel-to-shin normal.              Pronator drift - ? slight drift of RUE  HEENT:  PERRL, EOMI, oropharynx clear Cardiovascular:  RRR, +S1 +S2, no m/r/g Lungs:  CTA B/L, no w/r/r Abdomen:  + BS, soft, NT, ND, no rebound or guarding Musculoskeletal:    See motor above. Skin:  Intact.  No rashes noted.   LABS:  CBC  Recent Labs Lab 03/14/14 2344  WBC 13.2*  HGB 12.3  HCT 36.1  PLT 254   Coag's No results found for this basename: APTT, INR,  in the last 168 hours BMET  Recent Labs Lab 03/14/14 2344  NA 140  K 4.6  CL 102  CO2 23  BUN 18  CREATININE 0.82  GLUCOSE 123*   Electrolytes  Recent Labs Lab 03/14/14 2344  CALCIUM 9.3   Sepsis Markers  Recent Labs Lab 03/15/14 0326  LATICACIDVEN 1.50   ABG No results found for this basename: PHART, PCO2ART, PO2ART,  in the last 168 hours Liver Enzymes  Recent Labs Lab 03/14/14 2344  AST 18  ALT 15  ALKPHOS 104  BILITOT <0.2*  ALBUMIN 3.5   Cardiac Enzymes No results found for this basename: TROPONINI, PROBNP,  in the last 168 hours Glucose No results found for this basename: GLUCAP,  in the last 168 hours  Imaging Ct Head Wo Contrast  03/15/2014   CLINICAL DATA:  Status post seizures.  EXAM: CT HEAD WITHOUT CONTRAST  TECHNIQUE: Contiguous axial images were obtained from the base of the skull through the vertex without intravenous contrast.  COMPARISON:  CT of the head performed 11/13/2013  FINDINGS: There is no evidence of acute infarction, mass lesion, or intra- or extra-axial hemorrhage on CT.  The posterior fossa, including the cerebellum, brainstem and fourth ventricle, is within normal limits. The third and lateral ventricles, and basal ganglia are unremarkable in appearance. The cerebral hemispheres are symmetric in appearance, with normal gray-white differentiation. No mass effect or midline shift is seen.  There is no evidence of fracture; visualized osseous structures are unremarkable in appearance. The visualized portions of the orbits are within normal limits. The paranasal sinuses and mastoid air cells are well-aerated. No significant soft tissue abnormalities are seen.  IMPRESSION: Unremarkable noncontrast CT of the head.   Electronically Signed   By: Roanna RaiderJeffery  Chang M.D.   On: 03/15/2014 01:17   Dg Chest Portable 1 View  03/15/2014   CLINICAL DATA:  Short of breath, seizure.  EXAM:  PORTABLE CHEST - 1 VIEW  COMPARISON:  Prior chest x-ray 11/13/2013  FINDINGS: Stable position of left subclavian approach single-lumen port catheter. Catheter tip projects over the superior cavoatrial junction. Cardiac and mediastinal contours are unchanged. Low inspiratory volumes with left lingular atelectasis. No focal airspace consolidation. No pleural effusion or pneumothorax. Central bronchitic changes. No acute osseous abnormality.  IMPRESSION: 1. Low inspiratory volumes with probable lingular atelectasis. 2. Stable chronic central bronchitic changes.   Electronically Signed   By: Malachy MoanHeath  McCullough M.D.   On: 03/15/2014 00:24   ASSESSMENT / PLAN:  NEUROLOGIC A: seizure disorder     status epilepticus P:  - q2hr neuro checks      - continue Keppra       - Depakote on hold as supra therapeutic level      - EEG       - neurology consulted       - tox screen       PULMONARY A:  Protects airway   P:  - close monitoring in ICU      - continue supplemental oxygen      - goal SpO2 > 92%      - may need airway if continues to seize           CARDIOVASCULAR A: no acute abnormality. P:   RENAL A:  No acute abnormality. P:       - check Mg  GASTROINTESTINAL A:  No acute abnormality.  P:  - No stress ulcer ppx needed.      - Diet NPO  HEMATOLOGIC A:  VTE Px.  P:         - VTE ppx - heparin  INFECTIOUS A:  Leukocytosis, likely secondary to seizures / stress      Doubt CNS infection P:   Trend WBC / temp  ENDOCRINE A:  No hypoglycemia P:    Evelena Peat, DO - PGY1 Internal Medicine Residency Teaching Program  03/15/2014, 3:46 AM  I have personally obtained history, examined patient, evaluated and interpreted laboratory and imaging results, reviewed medical records, formulated assessment / plan and placed orders.  CRITICAL CARE:  The patient is critically ill with multiple organ systems failure and requires high complexity decision making for assessment and support,  frequent evaluation and titration of therapies, application of advanced monitoring technologies and extensive interpretation of multiple databases. Critical Care Time devoted to patient care services described in this note is 40 minutes.   Lonia Farber, MD Pulmonary and Critical Care Medicine [facility] Pager: [phone]  03/15/2014, 6:27 AM

## 2014-03-15 NOTE — Telephone Encounter (Signed)
No problem, please schedule with me. Thanks

## 2014-03-15 NOTE — ED Notes (Signed)
Pt with seizure-like activity that produce clear secretions in airway.  Pt's airway suctioned.  Pt maintaining O2 sats with 1 episode of desat at 88% on NRB.  EDP notified.  Order for 2mg  of Ativan IV given.  Pt currently maintaining O2 sats at 100% on NRB.  Pt stating at this time "Am I going to die?".  PERRL intact.

## 2014-03-15 NOTE — ED Notes (Signed)
Internal medicine at bedside

## 2014-03-15 NOTE — ED Notes (Signed)
Brenda MillardJames Fein [phone].  Floor RN to notify husband when pt on the floor.

## 2014-03-15 NOTE — ED Notes (Signed)
Pt with shaking right arm.  Airway protected at this moment.  O2 sats 100% on NRB.

## 2014-03-15 NOTE — Progress Notes (Signed)
1730 Pt transferred to 6N23. [facility]. No signs of seizure activity at current time. No complaints of pain or discomfort. Pt VSS. All questions answered at bedside per pt and RN.

## 2014-03-15 NOTE — Progress Notes (Signed)
Entered pt room to access IV pole alarming. Found IV pole facing bedside with pt attempting to change IV rate. Normal saline hanging with Keppra IV piggyback. Pt stated "I do not know how to change that, I am seeing double." Informed patient she was not to change or touch the IV pole. Locked IV pole. IV Keppra finished infusing, red capped IV tubing.

## 2014-03-15 NOTE — Consult Note (Signed)
Neurology Consultation Reason for Consult: Possible seizures Referring Physician: Judd Lien, D  CC: Seizures  History is obtained from:Patient, medical record.   HPI: Mahagony Grieb is a 53 y.o. female with a history of repeated events concerning for seizures. I know her well from a previous admission during which she had these spells captured on EEG. There was irregular delta that became very prominent during the spells, but no clear evolutionary discharge that was convincing for seizure. Clinically the events captured would be expected to be associated with more obvious discharges.   At that time, she was also having repeated headaches and it was noted that frequently, she would grab her head after a burst of quasi-rhythmic delta on EEG. 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   I did not feel, however, given the association with delta activity that I could exclude seizure based on that study. She had persistent headache and both to check ICP as well as other studies, an LP was done which showed only a very mildly elevated protein(46 with upper limit of normal 45).   She was discharged on keppra and depakote with elavil started as well with the plan to follow up with Dr. Anne Hahn of [facility], but it does not appear that this happened.   She was seen 3/31 by Dr. Allena Katz with [facility].   Tonight, She had several episodes concerning for seizures and was brought to the emergency department. Here, she had another episode after arrival with posturing and unresponsiveness.     ROS: Unable to assess secondary to patient's altered mental status.    Past Medical History  Diagnosis Date  . GERD (gastroesophageal reflux disease)   . Hyperlipidemia   . History of blood transfusion   . Seizures     ? pseudoseizures  . Chronic headaches   . Phlebitis   . History of colonic polyps   . Anxiety   .  Behcet's disease     has port-a-cath    Family History: Unable to assess secondary to patient's altered mental status.    Social History: Tob: Unable to assess secondary to patient's altered mental status.    Exam: Current vital signs: SpO2 100% Vital signs in last 24 hours: SpO2:  [100 %] 100 % (04/01 2347)  General: in bed, lethargic CV: RRR Mental Status: Patient is lethargic, but is able to show thumbs bilaterally and  Cranial Nerves: II: Does not clearly blink to threat, but does fixate and  Pupils are equal, round, and reactive to light.  Discs are difficult to visualize. III,IV, VI: appears to have slightly dysconjugate gaze on left gaze, with left eye outwardly deviated compare dot the right. conjugate when looking to the right. V,VII: blinks to eyelid stimulation bilaterally VIII: follows commands X, XI, XII: Unable to assess secondary to patient's altered mental status.  Motor: Tone is normal. Bulk is normal. Moves all extremities to command, but does not cooperate for strength testing.  Sensory: Responds to nox stim x 4.  Deep Tendon Reflexes: 2+ and symmetric in the biceps and patellae.  Cerebellar: Unable to assess secondary to patient's altered mental status.   Gait: Unable to assess secondary to patient's altered mental status.           I have reviewed labs in epic and the results pertinent to this consultation are: Elevated valproate level.  CBC leukocytosis.   I have reviewed the  images obtained: CT head - negative acute.   Impression: 53 yo F with recurrent episodes of unclear etiology, possibly seizure. As described above, EEG recorded irregular delta activity during these episodes previously, making seizure less likely but not excluding it completely. Another possibility could be hyperventilation following strenuous activity(eg psychogenic activity). Also of consideration would be basilar migraine.   She is currently following commands, so  if seizure, I feel that this represents a post-ictal state. I would also favor treating for possible migraine with aura at this time.   Recommendations: 1) hold depakote, recheck level in the morning.  2) additional dose of keppra 1gm x 1 now.  3) will give toradol, reglan x 1 in case this does represent an unusual migraine aura.  4) eeg   Ritta SlotMcNeill Ethan Kasperski, MD [facility] [phone]  If 7pm- 7am, please page neurology on call as listed in AMION.

## 2014-03-15 NOTE — Progress Notes (Addendum)
PULMONARY / CRITICAL CARE MEDICINE   Name: Brenda Mcguire MRN: [id number] DOB: 1961-04-23    ADMISSION DATE:  03/14/2014  PRIMARY SERVICE: PCCM  CHIEF COMPLAINT:  Seizures  BRIEF PATIENT DESCRIPTION: 53 year old with PMH of seizure disorder and Behcet's disease. She is followed by Dr. Nita Sickleonika Patel (neurologist) for her seizure disorder and last visit was on 03/13/14 at which time. She reports compliance with Keppra and Dilantin.  She was noted by family member to have had witnessed seizure at home. EMS was called and noted seizure activity upon their arrival. Patient given 5mg  Versed x 2 prior to ED arrival. She received 2mg  Ativan after witnessed seizure in ED. She noted double vision and felt like everything was in "slow motion" prior to having her seizure. She denies new medications, EtOH or drug use. She has history of migraine but denies recent headache.  SIGNIFICANT EVENTS / STUDIES:  03/15/14 CT head without contrast - unremarkable noncontrast CT of the head.  LINES / TUBES: 2010 port-a-cath  CULTURES: BC 4/2>>  ANTIBIOTICS: none  SUBJECTIVE:   VITAL SIGNS: Temp:  [97.6 F (36.4 C)-98.5 F (36.9 C)] 98 F (36.7 C) (04/02 0800) Pulse Rate:  [70-110] 72 (04/02 1300) Resp:  [13-23] 14 (04/02 1300) BP: (87-112)/(50-75) 90/58 mmHg (04/02 1300) SpO2:  [96 %-100 %] 100 % (04/02 1300) Weight:  [197 lb 5 oz (89.5 kg)] 197 lb 5 oz (89.5 kg) (04/02 0553)  HEMODYNAMICS:   VENTILATOR SETTINGS:   INTAKE / OUTPUT: Intake/Output     04/01 0701 - 04/02 0700 04/02 0701 - 04/03 0700   IV Piggyback 110 115   Total Intake(mL/kg) 110 (1.2) 115 (1.3)   Urine (mL/kg/hr) 475 400 (0.7)   Total Output 475 400   Net -365 -285          PHYSICAL EXAMINATION: General:  RASS -3 Neuro:  Pt not alert.  HEENT:  PERRLA Cardiovascular:  RRR Lungs:  CTAB Abdomen:  soft, NT/ND Musculoskeletal:  deferred Skin:  warm, dry  LABS:  I have reviewed all the lab results.  CXR: Low volumes.  Chronic central bronchitic changes.   ASSESSMENT / PLAN:  NEUROLOGIC  A: AMS vs. malingering h/o seizure disorder  status epilepticus, resolved  P: - consider psych consult - frequent neuro checks - continue Keppra  - Depakote on hold as supra therapeutic level  - EEG  - neurology consulted  PULMONARY  A: Protects airway  P: - transfer to med-surg - continue supplemental oxygen PRN  - goal SpO2 > 92%   CARDIOVASCULAR  A: no acute abnormality.  P:   RENAL  A: No acute abnormality.  P:   GASTROINTESTINAL  A: No acute abnormality.  P: No SUP needed.  - Diet NPO   HEMATOLOGIC  A: VTE Px.  P: VTE ppx - heparin   INFECTIOUS  A: Leukocytosis, likely secondary to seizures / stress  Doubt CNS infection  P: Trend WBC / temp   ENDOCRINE  A: No hypoglycemia  P:   PCCM ATTENDING: I have interviewed and examined the patient and reviewed the database. I have formulated the assessment and plan as reflected in the note above with amendments made by me.   Seen with Dr Delane GingerGill.   She seems to be malingering. Her Doll's eye's response appears to be abnormal in a way that indicates that she is conscious. There is no reason for her to remain in ICU and I suggest a psychiatry consultation  Will transfer out of  ICU and to [facility] service in AM 4/03. PCCM will sign off. Please call if we can be of further assistance   Billy Fischer, MD;  PCCM service; Mobile [phone]

## 2014-03-15 NOTE — Progress Notes (Signed)
EEG Completed; Results Pending  

## 2014-03-15 NOTE — ED Notes (Signed)
Dr. Judd Lienelo assess pt prior to taking pt to the floor.

## 2014-03-16 DIAGNOSIS — M352 Behcet's disease: Secondary | ICD-10-CM

## 2014-03-16 MED ORDER — DIVALPROEX SODIUM 250 MG PO DR TAB: DELAYED_RELEASE_TABLET | ORAL | Status: DC

## 2014-03-16 MED ORDER — HEPARIN SOD (PORK) LOCK FLUSH 100 UNIT/ML IV SOLN
500.0000 [IU] | INTRAVENOUS | Status: AC | PRN
  Administered 2014-03-16: 500 [IU]

## 2014-03-16 NOTE — Discharge Summary (Signed)
Physician Discharge Summary  Brenda Mcguire ZOX:[id number] DOB: 1961/02/18 DOA: 03/14/2014  PCP: Nicki Reaper, NP  Admit date: 03/14/2014 Discharge date: 03/16/2014  Time spent: greater than 30 minutes  Recommendations for Outpatient Follow-up:  Follow up with psychiatry. Pt declines inpatient consult  Discharge Diagnoses:  Principal Problem:   Seizure disorder Active Problems:   Behcet's disease   GERD (gastroesophageal reflux disease)   Pseudoseizures  Discharge Condition: stable  Filed Weights   03/15/14 0553 03/16/14 0500  Weight: 89.5 kg (197 lb 5 oz) 85.73 kg (189 lb)    History of present illness:  53 year old woman with a PMH of seizure disorder (on Dilantin and Keppra, followed by Dr. Nita Sickle) and Behcet's disease. She was noted by family member to have had witnessed seizure at home. EMS was called and noted seizure activity upon their arrival. Patient given 5mg  Versed x 2 prior to ED arrival. She received 2mg  Ativan after witnessed seizure in ED. The patient is moderately sedate but able to provide some history. She says she has noted double vision today and felt like everything was in "slow motion" prior to having her seizure. She denies chest pain, palpitations. She reports compliance with medications. She denies new medications, EtOH or drug use. She has history of migraine but denies recent headache.    Hospital Course:  Admitted to ICU, critical care medicine per neuro recommendation, who evaluated patient in ED.  Pt had EEG during these spells, which did not correspond to any epileptiform charges. Likely psychogenic.  Valproic acid level borderline toxic on admission. Held during admission, then resumed at lower dose on discharge.  Transferred to hospitalists on 4/3.  PCCM had recommended psyciatry consult.  Patient declined, but admitted to much stress in her life, and would like to f/u as outpatient.  Procedures:  none  Consultations:  Critical  care  neurology  Discharge Exam: Filed Vitals:   03/16/14 0439  BP: 129/73  Pulse: 91  Temp: 99.4 F (37.4 C)  Resp: 19    General: alert, occasionally tearful Cardiovascular: RRR Respiratory: CTA Neuro: nonfocal  Discharge Instructions  Discharge Orders   Future Appointments Provider Department Dept Phone   04/05/2014 9:00 AM [facility] EEG [phone]   06/19/2014 2:30 PM Glendale Chard, MD [facility] Neurology [facility] [phone]   Future Orders Complete By Expires   Activity as tolerated - No restrictions  As directed    Diet general  As directed        Medication List         ALPRAZolam 1 MG tablet  Commonly known as:  XANAX  Take 1 tablet (1 mg total) by mouth 2 (two) times daily as needed for anxiety.     amitriptyline 25 MG tablet  Commonly known as:  ELAVIL  TAKE 1 TABLET (25 MG TOTAL) BY MOUTH AT BEDTIME.     divalproex 250 MG DR tablet  Commonly known as:  DEPAKOTE  Change to 2 tablets in the morning and afternoon, 3 tablets in the evening     gabapentin 300 MG capsule  Commonly known as:  NEURONTIN  TAKE 2 CAPSULES (600 MG TOTAL) BY MOUTH 3 (THREE) TIMES DAILY.     HYDROcodone-acetaminophen 5-325 MG per tablet  Commonly known as:  NORCO/VICODIN  Take 1 tablet by mouth every 12 (twelve) hours.     hydroxychloroquine 200 MG tablet  Commonly known as:  PLAQUENIL  Take 1 tablet (200 mg total) by mouth 2 (  two) times daily.     levETIRAcetam 1000 MG tablet  Commonly known as:  KEPPRA  Take 1 tablet (1,000 mg total) by mouth 2 (two) times daily.       Allergies  Allergen Reactions  . Iohexol Anaphylaxis, Hives and Shortness Of Breath  . Codeine Itching  . Hepatitis B Vaccine   . Imitrex [Sumatriptan]     Makes her have svt       Follow-up Information   Follow up with PATEL, DONIKA, MD In 3 weeks.   Specialty:  Neurology   Contact information:   [address] [place] [address] [phone]        Follow up with psychiatrist. (to be arranged by your primary care provider)       Follow up with BAITY, REGINA, NP In 2 weeks.   Specialty:  Internal Medicine   Contact information:   [address] [place] [address] [phone]        The results of significant diagnostics from this hospitalization (including imaging, microbiology, ancillary and laboratory) are listed below for reference.    Significant Diagnostic Studies: Dg Thoracic Spine W/swimmers  02/14/2014   CLINICAL DATA:  Back pain.  EXAM: THORACIC SPINE - 2 VIEW + SWIMMERS  COMPARISON:  None.  FINDINGS: Left subclavian Port-A-Cath noted. Mild levoconvex curve of the mid to lower thoracic spine. Cholecystectomy clips are present in the right upper quadrant. Thoracic vertebral body height appears preserved. Lower cervical spondylosis is incidentally noted at C5-C6 and C6-C7.  IMPRESSION: No acute osseous abnormality. No significant thoracic spondylosis. Mild levoconvex scoliosis in the mid to lower thoracic spine.   Electronically Signed   By: Andreas Newport M.D.   On: 02/14/2014 16:11   Ct Head Wo Contrast  03/15/2014   CLINICAL DATA:  Status post seizures.  EXAM: CT HEAD WITHOUT CONTRAST  TECHNIQUE: Contiguous axial images were obtained from the base of the skull through the vertex without intravenous contrast.  COMPARISON:  CT of the head performed 11/13/2013  FINDINGS: There is no evidence of acute infarction, mass lesion, or intra- or extra-axial hemorrhage on CT.  The posterior fossa, including the cerebellum, brainstem and fourth ventricle, is within normal limits. The third and lateral ventricles, and basal ganglia are unremarkable in appearance. The cerebral hemispheres are symmetric in appearance, with normal gray-white differentiation. No mass effect or midline shift is seen.  There is no evidence of fracture; visualized osseous structures are unremarkable in appearance. The visualized portions of the orbits are within  normal limits. The paranasal sinuses and mastoid air cells are well-aerated. No significant soft tissue abnormalities are seen.  IMPRESSION: Unremarkable noncontrast CT of the head.   Electronically Signed   By: Roanna Raider M.D.   On: 03/15/2014 01:17   Dg Chest Portable 1 View  03/15/2014   CLINICAL DATA:  Short of breath, seizure.  EXAM: PORTABLE CHEST - 1 VIEW  COMPARISON:  Prior chest x-ray 11/13/2013  FINDINGS: Stable position of left subclavian approach single-lumen port catheter. Catheter tip projects over the superior cavoatrial junction. Cardiac and mediastinal contours are unchanged. Low inspiratory volumes with left lingular atelectasis. No focal airspace consolidation. No pleural effusion or pneumothorax. Central bronchitic changes. No acute osseous abnormality.  IMPRESSION: 1. Low inspiratory volumes with probable lingular atelectasis. 2. Stable chronic central bronchitic changes.   Electronically Signed   By: Malachy Moan M.D.   On: 03/15/2014 00:24   EEG ELECTROENCEPHALOGRAM REPORT  Patient: Circe Chilton Room #:  1O10  EEG No. ID: [id number]  Age: 53 y.o. Sex: female  Referring Physician: ZUBELEVITSKIY  Report Date: 03/15/2014  Interpreting Physician: Aline Brochure  History: Almedia Cordell is an 53 y.o. female with a history of recurrent events concerning for seizure activity. Previous EEG studies have shown runs of irregular delta activity during her apparent characteristic spells. No frank epileptic activity has been correlated with her spells however.  Indications for study: Rule out seizure activity.  Technique: This is an 18 channel routine scalp EEG performed at the bedside with bipolar and monopolar montages arranged in accordance to the international 10/20 system of electrode placement.  Description: This EEG recording was performed during wakefulness. Background activity consisted of low amplitude 1-2 Hz diffuse delta activity with superimposed irregular faster rhythms  symmetrically. There were frequent occurrences of slightly higher amplitude diffuse delta activity lasting several seconds. There was no clear correlation with a change in behavior or mental status. Patient also had an episode of shaking of her left upper extremity as well as head turning and inattentiveness with no change in background cerebral activity. Photic stimulation produced symmetrical occipital driving response. Hyperventilation was not performed. No frank epileptiform activity was recorded.  Interpretation: This EEG was abnormal with mild generalized nonspecific slowing of cerebral activity. There were occurrences of intermittent delta activity is seen on previous EEG recordings. Significance is unclear. There is no clear evidence of seizure activity, including during a spell of apparent focal seizure-like clinical activity.  Venetia Maxon M.D.  [facility]  [phone]  Microbiology: Recent Results (from the past 240 hour(s))  MRSA PCR SCREENING     Status: None   Collection Time    03/15/14  5:55 AM      Result Value Ref Range Status   MRSA by PCR NEGATIVE  NEGATIVE Final   Comment:            The GeneXpert MRSA Assay (FDA     approved for NASAL specimens     only), is one component of a     comprehensive MRSA colonization     surveillance program. It is not     intended to diagnose MRSA     infection nor to guide or     monitor treatment for     MRSA infections.  CULTURE, BLOOD (ROUTINE X 2)     Status: None   Collection Time    03/15/14  9:15 AM      Result Value Ref Range Status   Specimen Description BLOOD RIGHT HAND   Final   Special Requests     Final   Value: BOTTLES DRAWN AEROBIC AND ANAEROBIC 10CC BLUE 5CC RED   Culture  Setup Time     Final   Value: 03/15/2014 14:13     Performed at [facility]   Culture     Final   Value:        BLOOD CULTURE RECEIVED NO GROWTH TO DATE CULTURE WILL BE HELD FOR 5 DAYS BEFORE ISSUING A FINAL NEGATIVE REPORT      Performed at [facility]   Report Status PENDING   Incomplete  CULTURE, BLOOD (ROUTINE X 2)     Status: None   Collection Time    03/15/14 11:40 AM      Result Value Ref Range Status   Specimen Description BLOOD RIGHT HAND   Final   Special Requests BOTTLES DRAWN AEROBIC ONLY 2CC   Final   Culture  Setup Time  Final   Value: 03/15/2014 16:53     [facility]formed at [facility]   Culture     Final   Value:        BLOOD CULTURE RECEIVED NO GROWTH TO DATE CULTURE WILL BE HELD FOR 5 DAYS BEFORE ISSUING A FINAL NEGATIVE REPORT     [facility]formed at [facility]   Report Status PENDING   Incomplete     Labs: Basic Metabolic Panel:  Recent Labs Lab 03/14/14 2344 03/15/14 0915  NA 140 144  K 4.6 4.4  CL 102 106  CO2 23 25  GLUCOSE 123* 90  BUN 18 13  CREATININE 0.82 0.72  CALCIUM 9.3 8.9  MG  --  2.0   Liver Function Tests:  Recent Labs Lab 03/14/14 2344  AST 18  ALT 15  ALKPHOS 104  BILITOT <0.2*  PROT 7.1  ALBUMIN 3.5   No results found for this basename: LIPASE, AMYLASE,  in the last 168 hours No results found for this basename: AMMONIA,  in the last 168 hours CBC:  Recent Labs Lab 03/14/14 2344 03/15/14 0915  WBC 13.2* 9.2  NEUTROABS 9.9*  --   HGB 12.3 11.3*  HCT 36.1 33.4*  MCV 90.9 91.8  PLT 254 217   Cardiac Enzymes:  Recent Labs Lab 03/15/14 0915 03/15/14 1419  TROPONINI <0.30 <0.30   BNP: BNP (last 3 results) No results found for this basename: PROBNP,  in the last 8760 hours CBG: No results found for this basename: GLUCAP,  in the last 168 hours     Signed:  Elyan Vanwieren L  [facility] 03/16/2014, 10:03 AM

## 2014-03-16 NOTE — Progress Notes (Addendum)
NURSING PROGRESS NOTE  Brenda Mcguire [id number] Discharge Data: 03/16/2014 1:30 PM Attending Provider: Christiane Haorinna L Sullivan, MD WUJ:[id number]:BAITY, Rene KocherEGINA, NP     Brenda MannanWanda Kuehne to be D/C'd Home per MD order.  Discussed with the patient the After Visit Summary and all questions fully answered. All IV's discontinued with no bleeding noted. All belongings returned to patient for patient to take home. Patient requested to ambulate off the floor after d/c with her Mcguire. She ambulated well without any issues.  Last Vital Signs:  Blood pressure 129/73, pulse 91, temperature 99.4 F (37.4 C), temperature source Oral, resp. rate 19, height 5' 10.5" (1.791 m), weight 85.73 kg (189 lb), SpO2 96.00%.  Discharge Medication List   Medication List         ALPRAZolam 1 MG tablet  Commonly known as:  XANAX  Take 1 tablet (1 mg total) by mouth 2 (two) times daily as needed for anxiety.     amitriptyline 25 MG tablet  Commonly known as:  ELAVIL  TAKE 1 TABLET (25 MG TOTAL) BY MOUTH AT BEDTIME.     divalproex 250 MG DR tablet  Commonly known as:  DEPAKOTE  Change to 2 tablets in the morning and afternoon, 3 tablets in the evening     gabapentin 300 MG capsule  Commonly known as:  NEURONTIN  TAKE 2 CAPSULES (600 MG TOTAL) BY MOUTH 3 (THREE) TIMES DAILY.     HYDROcodone-acetaminophen 5-325 MG per tablet  Commonly known as:  NORCO/VICODIN  Take 1 tablet by mouth every 12 (twelve) hours.     hydroxychloroquine 200 MG tablet  Commonly known as:  PLAQUENIL  Take 1 tablet (200 mg total) by mouth 2 (two) times daily.     levETIRAcetam 1000 MG tablet  Commonly known as:  KEPPRA  Take 1 tablet (1,000 mg total) by mouth 2 (two) times daily.

## 2014-03-16 NOTE — Progress Notes (Signed)
[facility]  SURG[facility] [address] [id number] [place] [id number] Phone: [phone]  March 16, 2014  Patient: Brenda MannanWanda Mcguire  Date of Birth: Mar 03, 1961  Date of Visit: 03/14/2014    To Whom It May Concern:  Brenda MannanWanda Mcguire was seen and treated in our emergency department on 03/14/2014. Brenda MannanWanda Mcguire  may return to work on 03/19/14.  Sincerely,      Crista Curborinna Jalayah Gutridge, M.D.

## 2014-03-16 NOTE — Progress Notes (Signed)
Subjective: Patient did not experience seizure like activity overnight. Headache is markedly improved. She is no longer complaining of diplopia.  Objective: Current vital signs: BP 129/73  Pulse 91  Temp(Src) 99.4 F (37.4 C) (Oral)  Resp 19  Ht 5' 10.5" (1.791 m)  Wt 85.73 kg (189 lb)  BMI 26.73 kg/m2  SpO2 96%  Neurologic Exam: Alert and in no acute distress. She is well-oriented to time as well as place. Face was symmetrical with no focal weakness. Speech was normal with no dysarthria. Patient moved extremities equally with normal strength throughout.  Medications: I have reviewed the patient's current medications.  Assessment/Plan: Recurrent seizure like activity of unclear etiology. EEG on 03/15/2014 showed no epileptiform discharges during patient's apparent clinical seizure activity. Psychophysiologic factors suspected with likely pseudoseizures as well as possible bona fide primary seizure disorder.  I agree with obtaining psychiatric intervention, as patient admits to having significant situational stressors.  I am recommending resuming Depakote at 500 mg twice a day and 750 mg at bedtime.  No further neurological intervention is indicated at this point. I will see her in followup on an as-needed basis after this visit.  C.R. Roseanne RenoStewart, MD [facility] [phone]  03/16/2014  9:52 AM

## 2014-03-17 ENCOUNTER — Other Ambulatory Visit: Payer: Self-pay | Admitting: Internal Medicine

## 2014-03-19 ENCOUNTER — Telehealth: Payer: Self-pay

## 2014-03-19 LAB — CULTURE, BLOOD (ROUTINE X 2)

## 2014-03-19 MED ORDER — AMITRIPTYLINE HCL 25 MG PO TABS: ORAL_TABLET | ORAL | Status: DC

## 2014-03-19 NOTE — Telephone Encounter (Signed)
Pt is aware and states she is okay not to do repeat labs at this time

## 2014-03-19 NOTE — Telephone Encounter (Signed)
This is likely contaminate, I do not thank she needs treatment at this time. If this concerns her, she can come to the office to have her blood cultures repeated but we can not draw off her port here.

## 2014-03-19 NOTE — Telephone Encounter (Signed)
Left message on voicemail for pt to return call  

## 2014-03-19 NOTE — Telephone Encounter (Signed)
[facility]renda Mcgu[facility] with [facility] called report; pt was discharged from hospital; blood culture, aerobic bottle positive gram + cocci in clusters. Staphylococcus species coagulate negative. Brenda Mcguire will fax printed report to [phone] atten [place].

## 2014-03-21 LAB — CULTURE, BLOOD (ROUTINE X 2): CULTURE: NO GROWTH

## 2014-03-21 NOTE — Telephone Encounter (Signed)
I have called the patient several times and left message for the patient to call us back for her to see Dr Karel JarvisAquino and she will not call us back. I just wanted wanted you to know the status.  Thanks  Annabelle Harmanana

## 2014-03-26 NOTE — Telephone Encounter (Signed)
Any updates on rescheduling this patient?  If you cannot get a hold of her, she has a f/u with me in July and you can switch her as a new patient with Dr. Karel JarvisAquino for same time.  Marchelle Rinella K. Allena KatzPatel, DO

## 2014-03-27 NOTE — Telephone Encounter (Signed)
I have talked with her but she was riding down the rode and said that she would call me back. I have tried to call her again and cant get a answer so if you would like me to put her on Dr Karel JarvisAquino sch for July I will just let me know.  Thanks  Annabelle Harmanana

## 2014-03-28 ENCOUNTER — Encounter: Payer: Self-pay | Admitting: Internal Medicine

## 2014-03-29 ENCOUNTER — Other Ambulatory Visit: Payer: Self-pay | Admitting: Internal Medicine

## 2014-03-29 DIAGNOSIS — R51 Headache: Principal | ICD-10-CM

## 2014-03-29 DIAGNOSIS — R519 Headache, unspecified: Secondary | ICD-10-CM

## 2014-03-29 MED ORDER — HYDROCODONE-ACETAMINOPHEN 5-325 MG PO TABS: 1.0000 | ORAL_TABLET | Freq: Two times a day (BID) | ORAL | Status: DC

## 2014-04-03 ENCOUNTER — Encounter: Payer: Self-pay | Admitting: Neurology

## 2014-04-05 ENCOUNTER — Other Ambulatory Visit (HOSPITAL_COMMUNITY): Payer: Federal, State, Local not specified - PPO

## 2014-04-06 ENCOUNTER — Encounter: Payer: Self-pay | Admitting: Neurology

## 2014-04-19 ENCOUNTER — Other Ambulatory Visit: Payer: Self-pay | Admitting: Internal Medicine

## 2014-04-21 ENCOUNTER — Encounter: Payer: Self-pay | Admitting: Internal Medicine

## 2014-04-23 ENCOUNTER — Other Ambulatory Visit: Payer: Self-pay

## 2014-04-23 ENCOUNTER — Encounter: Payer: Self-pay | Admitting: Internal Medicine

## 2014-04-23 MED ORDER — ALPRAZOLAM 0.5 MG PO TABS: 0.5000 mg | ORAL_TABLET | Freq: Three times a day (TID) | ORAL | Status: DC | PRN

## 2014-04-23 NOTE — Telephone Encounter (Signed)
Rx called into pharmacy per Cecile Sheereregina Baity's order listed in pt email msg

## 2014-04-23 NOTE — Telephone Encounter (Signed)
Please phone in xanax 

## 2014-04-30 ENCOUNTER — Encounter: Payer: Self-pay | Admitting: Internal Medicine

## 2014-04-30 DIAGNOSIS — R51 Headache: Principal | ICD-10-CM

## 2014-04-30 DIAGNOSIS — R519 Headache, unspecified: Secondary | ICD-10-CM

## 2014-04-30 MED ORDER — HYDROCODONE-ACETAMINOPHEN 5-325 MG PO TABS: 1.0000 | ORAL_TABLET | Freq: Two times a day (BID) | ORAL | Status: DC

## 2014-05-01 ENCOUNTER — Encounter: Payer: Self-pay | Admitting: Internal Medicine

## 2014-05-02 MED ORDER — LIDOCAINE VISCOUS 2 % MT SOLN: 20.0000 mL | OROMUCOSAL | Status: DC | PRN

## 2014-05-11 ENCOUNTER — Encounter: Payer: Self-pay | Admitting: Internal Medicine

## 2014-05-24 ENCOUNTER — Other Ambulatory Visit: Payer: Self-pay | Admitting: Internal Medicine

## 2014-05-25 NOTE — Telephone Encounter (Signed)
I sent gabapentin electronically, please phone in xanax

## 2014-05-25 NOTE — Telephone Encounter (Signed)
Xanax last filled 04/23/14--Gabapentin last filled 02/07/14--please advise

## 2014-05-25 NOTE — Telephone Encounter (Signed)
Xanax called into pharmacy.

## 2014-05-28 ENCOUNTER — Inpatient Hospital Stay (HOSPITAL_COMMUNITY): Payer: Federal, State, Local not specified - PPO

## 2014-05-28 ENCOUNTER — Emergency Department: Payer: Self-pay | Admitting: Emergency Medicine

## 2014-05-28 ENCOUNTER — Inpatient Hospital Stay (HOSPITAL_COMMUNITY)
Admission: AD | Admit: 2014-05-28 | Discharge: 2014-06-01 | DRG: 100 | Disposition: A | Discharge status: Home / Self Care | Payer: Federal, State, Local not specified - PPO | Source: Other Acute Inpatient Hospital | Attending: Internal Medicine | Admitting: Internal Medicine

## 2014-05-28 DIAGNOSIS — M352 Behcet's disease: Secondary | ICD-10-CM | POA: Diagnosis present

## 2014-05-28 DIAGNOSIS — R569 Unspecified convulsions: Secondary | ICD-10-CM | POA: Diagnosis present

## 2014-05-28 DIAGNOSIS — R031 Nonspecific low blood-pressure reading: Secondary | ICD-10-CM | POA: Diagnosis present

## 2014-05-28 DIAGNOSIS — F411 Generalized anxiety disorder: Secondary | ICD-10-CM | POA: Diagnosis present

## 2014-05-28 DIAGNOSIS — G40401 Other generalized epilepsy and epileptic syndromes, not intractable, with status epilepticus: Principal | ICD-10-CM | POA: Diagnosis present

## 2014-05-28 DIAGNOSIS — J9600 Acute respiratory failure, unspecified whether with hypoxia or hypercapnia: Secondary | ICD-10-CM | POA: Diagnosis present

## 2014-05-28 DIAGNOSIS — F329 Major depressive disorder, single episode, unspecified: Secondary | ICD-10-CM | POA: Diagnosis present

## 2014-05-28 DIAGNOSIS — F3289 Other specified depressive episodes: Secondary | ICD-10-CM | POA: Diagnosis present

## 2014-05-28 DIAGNOSIS — E785 Hyperlipidemia, unspecified: Secondary | ICD-10-CM | POA: Diagnosis present

## 2014-05-28 DIAGNOSIS — Z765 Malingerer [conscious simulation]: Secondary | ICD-10-CM

## 2014-05-28 DIAGNOSIS — Z981 Arthrodesis status: Secondary | ICD-10-CM | POA: Diagnosis not present

## 2014-05-28 DIAGNOSIS — F445 Conversion disorder with seizures or convulsions: Secondary | ICD-10-CM

## 2014-05-28 DIAGNOSIS — J9601 Acute respiratory failure with hypoxia: Secondary | ICD-10-CM

## 2014-05-28 DIAGNOSIS — G4700 Insomnia, unspecified: Secondary | ICD-10-CM

## 2014-05-28 DIAGNOSIS — G40901 Epilepsy, unspecified, not intractable, with status epilepticus: Secondary | ICD-10-CM | POA: Diagnosis present

## 2014-05-28 DIAGNOSIS — K219 Gastro-esophageal reflux disease without esophagitis: Secondary | ICD-10-CM | POA: Diagnosis present

## 2014-05-28 DIAGNOSIS — F450 Somatization disorder: Secondary | ICD-10-CM

## 2014-05-28 DIAGNOSIS — Z79899 Other long term (current) drug therapy: Secondary | ICD-10-CM

## 2014-05-28 LAB — CBC
HCT: 38.5 % (ref 35.0–47.0)
HCT: 31.5 % — ABNORMAL LOW (ref 36.0–46.0)
HEMOGLOBIN: 10.5 g/dL — AB (ref 12.0–15.0)
HGB: 12.9 g/dL (ref 12.0–16.0)
MCH: 30.7 pg (ref 26.0–34.0)
MCH: 30.6 pg (ref 26.0–34.0)
MCHC: 33.4 g/dL (ref 32.0–36.0)
MCHC: 33.3 g/dL (ref 30.0–36.0)
MCV: 92 fL (ref 80–100)
MCV: 91.8 fL (ref 78.0–100.0)
PLATELETS: 230 10*3/uL (ref 150–400)
Platelet: 292 10*3/uL (ref 150–440)
RBC: 4.2 10*6/uL (ref 3.80–5.20)
RBC: 3.43 MIL/uL — ABNORMAL LOW (ref 3.87–5.11)
RDW: 13.1 % (ref 11.5–14.5)
RDW: 12.8 % (ref 11.5–15.5)
WBC: 10.1 10*3/uL (ref 3.6–11.0)
WBC: 6.7 10*3/uL (ref 4.0–10.5)

## 2014-05-28 LAB — HEPATIC FUNCTION PANEL A (ARMC)
ALK PHOS: 116 U/L
AST: 15 U/L (ref 15–37)
Albumin: 3.7 g/dL (ref 3.4–5.0)
BILIRUBIN TOTAL: 0.2 mg/dL (ref 0.2–1.0)
Bilirubin, Direct: <0.1 mg/dL (ref 0.00–0.20)
SGPT (ALT): 20 U/L (ref 12–78)
Total Protein: 7.5 g/dL (ref 6.4–8.2)

## 2014-05-28 LAB — MRSA PCR SCREENING: MRSA BY PCR: NEGATIVE

## 2014-05-28 LAB — BASIC METABOLIC PANEL
ANION GAP: 11 (ref 7–16)
BUN: 18 mg/dL (ref 7–18)
CHLORIDE: 103 mmol/L (ref 98–107)
Calcium, Total: 9.1 mg/dL (ref 8.5–10.1)
Co2: 24 mmol/L (ref 21–32)
Creatinine: 0.89 mg/dL (ref 0.60–1.30)
EGFR (African American): >60
EGFR (Non-African Amer.): >60
Glucose: 133 mg/dL — ABNORMAL HIGH (ref 65–99)
Osmolality: 279 (ref 275–301)
POTASSIUM: 3.9 mmol/L (ref 3.5–5.1)
Sodium: 138 mmol/L (ref 136–145)

## 2014-05-28 LAB — DRUG SCREEN, URINE
AMPHETAMINES, UR SCREEN: NEGATIVE (ref ?–1000)
Barbiturates, Ur Screen: NEGATIVE (ref ?–200)
Benzodiazepine, Ur Scrn: POSITIVE (ref ?–200)
Cannabinoid 50 Ng, Ur ~~LOC~~: NEGATIVE (ref ?–50)
Cocaine Metabolite,Ur ~~LOC~~: NEGATIVE (ref ?–300)
MDMA (Ecstasy)Ur Screen: NEGATIVE (ref ?–500)
Methadone, Ur Screen: NEGATIVE (ref ?–300)
Opiate, Ur Screen: POSITIVE (ref ?–300)
Phencyclidine (PCP) Ur S: NEGATIVE (ref ?–25)
TRICYCLIC, UR SCREEN: POSITIVE (ref ?–1000)

## 2014-05-28 LAB — URINALYSIS, COMPLETE
BACTERIA: NONE SEEN
BLOOD: NEGATIVE
Bilirubin,UR: NEGATIVE
GLUCOSE, UR: NEGATIVE mg/dL (ref 0–75)
LEUKOCYTE ESTERASE: NEGATIVE
Nitrite: NEGATIVE
PH: 6 (ref 4.5–8.0)
PROTEIN: NEGATIVE
RBC,UR: NONE SEEN /HPF (ref 0–5)
Specific Gravity: 1.019 (ref 1.003–1.030)
Squamous Epithelial: NONE SEEN
WBC UR: 1 /HPF (ref 0–5)

## 2014-05-28 LAB — MAGNESIUM: Magnesium: 1.8 mg/dL (ref 1.5–2.5)

## 2014-05-28 LAB — PHOSPHORUS: Phosphorus: 2.9 mg/dL (ref 2.3–4.6)

## 2014-05-28 LAB — VALPROIC ACID LEVEL: VALPROIC ACID LVL: 24.4 ug/mL — AB (ref 50.0–100.0)

## 2014-05-28 LAB — ETHANOL

## 2014-05-28 MED ORDER — VALPROATE SODIUM 500 MG/5ML IV SOLN
750.0000 mg | Freq: Two times a day (BID) | INTRAVENOUS | Status: DC
  Administered 2014-05-28 – 2014-06-01 (×7): 750 mg via INTRAVENOUS
  Filled 2014-05-28 (×9): qty 7.5

## 2014-05-28 MED ORDER — SODIUM CHLORIDE 0.9 % IJ SOLN
10.0000 mL | INTRAMUSCULAR | Status: DC | PRN
  Administered 2014-05-28 – 2014-05-31 (×4): 10 mL

## 2014-05-28 MED ORDER — SODIUM CHLORIDE 0.9 % IV SOLN
1500.0000 mg | Freq: Two times a day (BID) | INTRAVENOUS | Status: AC
  Administered 2014-05-29 – 2014-05-31 (×6): 1500 mg via INTRAVENOUS
  Filled 2014-05-28 (×7): qty 15

## 2014-05-28 MED ORDER — INSULIN ASPART 100 UNIT/ML ~~LOC~~ SOLN: 1.0000 [IU] | SUBCUTANEOUS | Status: DC

## 2014-05-28 MED ORDER — SODIUM CHLORIDE 0.9 % IV BOLUS (SEPSIS)
1000.0000 mL | Freq: Once | INTRAVENOUS | Status: AC
  Administered 2014-05-28: 1000 mL via INTRAVENOUS

## 2014-05-28 MED ORDER — HEPARIN SODIUM (PORCINE) 5000 UNIT/ML IJ SOLN: 5000.0000 [IU] | Freq: Three times a day (TID) | INTRAMUSCULAR | Status: DC

## 2014-05-28 MED ORDER — PROPOFOL 10 MG/ML IV EMUL
INTRAVENOUS | Status: AC
  Filled 2014-05-28: qty 100

## 2014-05-28 MED ORDER — SODIUM CHLORIDE 0.9 % IV SOLN
1.0000 mg/h | INTRAVENOUS | Status: DC
  Administered 2014-05-28: 1 mg/h via INTRAVENOUS
  Filled 2014-05-28: qty 10

## 2014-05-28 MED ORDER — SODIUM CHLORIDE 0.9 % IV SOLN: 250.0000 mL | INTRAVENOUS | Status: DC | PRN

## 2014-05-28 MED ORDER — FAMOTIDINE IN NACL 20-0.9 MG/50ML-% IV SOLN
20.0000 mg | Freq: Two times a day (BID) | INTRAVENOUS | Status: DC
Start: 2014-05-28 — End: 2014-05-31
  Administered 2014-05-28 – 2014-05-31 (×6): 20 mg via INTRAVENOUS
  Filled 2014-05-28 (×7): qty 50

## 2014-05-28 MED ORDER — FENTANYL CITRATE 0.05 MG/ML IJ SOLN
25.0000 ug | INTRAMUSCULAR | Status: DC | PRN
  Filled 2014-05-28 (×2): qty 2

## 2014-05-28 MED ORDER — ALTEPLASE 2 MG IJ SOLR
2.0000 mg | Freq: Once | INTRAMUSCULAR | Status: AC
  Administered 2014-05-28: 2 mg
  Filled 2014-05-28: qty 2

## 2014-05-28 MED ORDER — KCL IN DEXTROSE-NACL 20-5-0.45 MEQ/L-%-% IV SOLN
INTRAVENOUS | Status: DC
  Administered 2014-05-28 – 2014-05-29 (×3): via INTRAVENOUS
  Filled 2014-05-28 (×3): qty 1000

## 2014-05-28 MED ORDER — FENTANYL CITRATE 0.05 MG/ML IJ SOLN
100.0000 ug | INTRAMUSCULAR | Status: DC | PRN
  Administered 2014-05-28 – 2014-05-29 (×6): 100 ug via INTRAVENOUS
  Filled 2014-05-28 (×5): qty 2

## 2014-05-28 MED ORDER — SODIUM CHLORIDE 0.9 % IV SOLN
1000.0000 mg | INTRAVENOUS | Status: AC
  Administered 2014-05-28: 1000 mg via INTRAVENOUS
  Filled 2014-05-28: qty 10

## 2014-05-28 MED ORDER — HEPARIN SODIUM (PORCINE) 5000 UNIT/ML IJ SOLN
5000.0000 [IU] | Freq: Three times a day (TID) | INTRAMUSCULAR | Status: DC
  Administered 2014-05-28 – 2014-06-01 (×12): 5000 [IU] via SUBCUTANEOUS
  Filled 2014-05-28 (×15): qty 1

## 2014-05-28 MED ORDER — PROPOFOL 10 MG/ML IV EMUL
5.0000 ug/kg/min | INTRAVENOUS | Status: DC
  Administered 2014-05-28: 25 ug/kg/min via INTRAVENOUS
  Administered 2014-05-29: 35 ug/kg/min via INTRAVENOUS
  Administered 2014-05-29: 45 ug/kg/min via INTRAVENOUS
  Administered 2014-05-29 (×2): 35 ug/kg/min via INTRAVENOUS
  Administered 2014-05-29: 45 ug/kg/min via INTRAVENOUS
  Filled 2014-05-28 (×6): qty 100

## 2014-05-28 MED ORDER — PANTOPRAZOLE SODIUM 40 MG IV SOLR: 40.0000 mg | Freq: Every day | INTRAVENOUS | Status: DC

## 2014-05-28 MED ORDER — LORAZEPAM 2 MG/ML IJ SOLN
INTRAMUSCULAR | Status: AC
  Filled 2014-05-28: qty 1

## 2014-05-28 MED ORDER — SODIUM CHLORIDE 0.9 % IV SOLN: INTRAVENOUS | Status: DC

## 2014-05-28 MED ORDER — SODIUM CHLORIDE 0.9 % IV SOLN
1.0000 mg/h | INTRAVENOUS | Status: DC
  Filled 2014-05-28 (×2): qty 10

## 2014-05-28 NOTE — Progress Notes (Signed)
Spoke to Dr. Roseanne RenoStewart about the patient having seizure like activity on her right side, including right arm and leg. Orders to increase sedation, propofol to 35 mcg/kg/min. Propofol increased and will continue to watch pt closely.

## 2014-05-28 NOTE — Progress Notes (Signed)
Wasted 210 of Fentanyl and 40 of Propofol hung by EMT in transport.  Amie CritchleyKaty Weddle witnessed waste. Ubah Radke C

## 2014-05-28 NOTE — H&P (Signed)
PULMONARY / CRITICAL CARE MEDICINE   Name: Brenda Mcguire MRN: [id number] DOB: May 15, 1961    ADMISSION DATE:  05/28/2014  PRIMARY SERVICE: PCCM  BRIEF PATIENT DESCRIPTION:  10 F with hx of suspected seizure disorder presented to [facility] ED with recurrent seizure or seizure like episodes eventually requiring intubation and transfer to [facility] ICU.   SIGNIFICANT EVENTS / STUDIES:  6/15 CT head: No definite acute intracranial abnormalities 6/15 EEG:   LINES / TUBES: L [place] Port a cath (longstanding) ETT 6/15 >>   CULTURES:   ANTIBIOTICS:   HISTORY OF PRESENT ILLNESS:   Pt unable to provide history. Family not present. Records from prior hospitalizations and from Center reviewed.   PAST MEDICAL HISTORY :  Past Medical History  Diagnosis Date  . GERD (gastroesophageal reflux disease)   . Hyperlipidemia   . History of blood transfusion   . Chronic headaches   . Phlebitis   . History of colonic polyps   . 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 Tonic clonic seizures   . Anxiety     "situational stress"   Past Surgical History  Procedure Laterality Date  . Cholecystectomy    . Tonsillectomy  1970  . Appendectomy  1975  . Lumbar fusion  1999    L5-S1  . Back surgery    . Abdominal hysterectomy  1988  . Portacath placement Left 2008  . Stone extraction with basket     Prior to Admission medications   Medication Sig Start Date End Date Taking? Authorizing Provider  ALPRAZolam (XANAX) 0.5 MG tablet TAKE 1 TABLET 3 TIMES A DAY AS NEEDED FOR ANXIETY    Nicki Reaper, NP  amitriptyline (ELAVIL) 25 MG tablet TAKE 1 TABLET (25 MG TOTAL) BY MOUTH AT BEDTIME. 03/19/14   Nicki Reaper, NP  divalproex (DEPAKOTE) 250 MG DR tablet Change to 2 tablets in the morning and afternoon, 3 tablets in the evening 03/16/14   Christiane Ha, MD  gabapentin (NEURONTIN) 300 MG capsule TAKE 2 CAPSULES (600 MG TOTAL) BY MOUTH 3 (THREE) TIMES DAILY.    Nicki Reaper, NP   HYDROcodone-acetaminophen (NORCO/VICODIN) 5-325 MG per tablet Take 1 tablet by mouth every 12 (twelve) hours. 04/30/14   Nicki Reaper, NP  hydroxychloroquine (PLAQUENIL) 200 MG tablet Take 1 tablet (200 mg total) by mouth 2 (two) times daily. 03/13/14   Nicki Reaper, NP  levETIRAcetam (KEPPRA) 1000 MG tablet Take 1 tablet (1,000 mg total) by mouth 2 (two) times daily. 11/15/13   Bernadene Person, NP  lidocaine (XYLOCAINE) 2 % solution Use as directed 20 mLs in the mouth or throat as needed for mouth pain. 05/02/14   Nicki Reaper, NP   Allergies  Allergen Reactions  . Iohexol Anaphylaxis, Hives and Shortness Of Breath  . Codeine Itching  . Hepatitis B Vaccine   . Imitrex [Sumatriptan]     Makes her have svt    FAMILY HISTORY:  Family History  Problem Relation Age of Onset  . Diabetes Mother   . Hyperlipidemia Mother   . Stroke Mother   . Hypertension Mother   . Colon cancer Mother   . Breast cancer Mother   . Early death Father   . Heart attack Father   . Heart disease Father   . Hyperlipidemia Father   . COPD Sister    SOCIAL HISTORY:  reports that she has never smoked. She has never used smokeless tobacco. She reports that she does not drink  alcohol or use illicit drugs.  REVIEW OF SYSTEMS:  N/A  SUBJECTIVE:    VITAL SIGNS: Pulse Rate:  [86-97] 97 (06/15 1700) Resp:  [16-18] 16 (06/15 1700) BP: (114-115)/(67-75) 114/75 mmHg (06/15 1700) SpO2:  [100 %] 100 % (06/15 1700) FiO2 (%):  [40 %] 40 % (06/15 1700) HEMODYNAMICS:   VENTILATOR SETTINGS: Vent Mode:  [-] PRVC FiO2 (%):  [40 %] 40 % Set Rate:  [16 bmp] 16 bmp Vt Set:  [500 mL] 500 mL PEEP:  [5 cmH20] 5 cmH20 INTAKE / OUTPUT: Intake/Output   None     PHYSICAL EXAMINATION: General:  Intubated, sedated, minimally responsive Neuro:  occ spont movement of all ext  HEENT:  EOMI, PERRL, NCAT Cardiovascular:  RRR s M Lungs:  Clear Abdomen: Soft, NT, NABS Ext: warm, no edema  LABS:  CBC No results found  for this basename: WBC, HGB, HCT, PLT,  in the last 168 hours Coag's No results found for this basename: APTT, INR,  in the last 168 hours BMET No results found for this basename: NA, K, CL, CO2, BUN, CREATININE, GLUCOSE,  in the last 168 hours Electrolytes No results found for this basename: CALCIUM, MG, PHOS,  in the last 168 hours Sepsis Markers No results found for this basename: LATICACIDVEN, PROCALCITON, O2SATVEN,  in the last 168 hours ABG No results found for this basename: PHART, PCO2ART, PO2ART,  in the last 168 hours Liver Enzymes No results found for this basename: AST, ALT, ALKPHOS, BILITOT, ALBUMIN,  in the last 168 hours Cardiac Enzymes No results found for this basename: TROPONINI, PROBNP,  in the last 168 hours Glucose No results found for this basename: GLUCAP,  in the last 168 hours   CXR: NACPD  ASSESSMENT / PLAN:  PULMONARY A:  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 P:   Cont full vent support - settings reviewed and/or adjusted Cont vent bundle Daily SBT if/when meets criteria  CARDIOVASCULAR A:  Transient hypotension related to sedation P:  NS bolus ordered Monitor BR, rhythm Vasopressors as needed to maintain MAP > 60 mmHg  Would not withhold anticonvulsant infusions due to hypotension   RENAL A:   No issues P:   Monitor BMET intermittently Monitor I/Os Correct electrolytes as indicated Maintenance fluids ordered  GASTROINTESTINAL A:   No issues P:   SUP: IV famotidine If not extubated 6/16, consider TFs  HEMATOLOGIC A:   No issues P:  DVT px: SQ heparin Monitor CBC intermittently Transfuse per usual ICU guidelines  INFECTIOUS A:   No issues P:   Micro and abx  Monitor temp, WBC  ENDOCRINE A:   No issues P:   Monitor glu on chem panels Initiate SSI for glu > 180  NEUROLOGIC A:   Status epilepticus - see Neuro Consult note. There is some uncertainty to this dx P:   Midaz infusion PRN fentanyl RASS goal -2 to  -3 Continuous EEG Daily WUA  TODAY'S SUMMARY:    I have personally obtained a history, examined the patient, evaluated laboratory and imaging results, formulated the assessment and plan and placed orders. CRITICAL CARE: The patient is critically ill with multiple organ systems failure and requires high complexity decision making for assessment and support, frequent evaluation and titration of therapies, application of advanced monitoring technologies and extensive interpretation of multiple databases. Critical Care Time devoted to patient care services described in this note is 35 minutes.   Billy Fischeravid Raza Bayless, MD ; [facility] [phone].  After 5:30 PM or  weekends, call [phone] [facility]monary and Critical Care Medicine [facility] Pager: [phone]  05/28/2014, 5:40 PM

## 2014-05-28 NOTE — Progress Notes (Signed)
STAT LTM EEG started 

## 2014-05-28 NOTE — Consult Note (Signed)
NEURO HOSPITALIST CONSULT NOTE    Reason for Consult: possible seizure activity  HPI:                                                                                                                                          Brenda Mcguire is an 53 y.o. female (per previous consult note) with a history of repeated events concerning for seizures. I know her well from a previous admission during which she had these spells captured on EEG. There was irregular delta that became very prominent during the spells, but no clear evolutionary discharge that was convincing for seizure. Clinically the events captured would be expected to be associated with more obvious discharges.   At that time, she was also having repeated headaches and it was noted that frequently, she would grab her head after a burst of quasi-rhythmic delta on EEG. 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   I did not feel, however, given the association with delta activity that I could exclude seizure based on that study. She had persistent headache and both to check ICP as well as other studies, an LP was done which showed only a very mildly elevated protein(46 with upper limit of normal 45).   She was discharged on keppra and depakote with elavil started as well with the plan to follow up with Dr. Anne HahnWillis of [facility], but it does not appear that this happened.   Patient was brought to [place] [facility] today due to further seizure activity.  [facility]. On arrival patient was brought to neurology ICU.  On arrival she was intubated and on Propofol but due to soft BP this was stopped.  Within ten minutes of arrival she was noted to have right arm flexion and right leg extension lasting for about 3 minutes.  This occurred while EEG was being hooked  up.    Past Medical History  Diagnosis Date  . GERD (gastroesophageal reflux disease)   . Hyperlipidemia   . History of blood transfusion   . Chronic headaches   . Phlebitis   . History of colonic polyps   . 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 Tonic clonic seizures   . Anxiety     "situational stress"    Past Surgical History  Procedure Laterality Date  . Cholecystectomy    . Tonsillectomy  1970  . Appendectomy  1975  . Lumbar fusion  1999    L5-S1  . Back surgery    . Abdominal hysterectomy  1988  . Portacath placement Left 2008  . Stone extraction with basket  Family History  Problem Relation Age of Onset  . Diabetes Mother   . Hyperlipidemia Mother   . Stroke Mother   . Hypertension Mother   . Colon cancer Mother   . Breast cancer Mother   . Early death Father   . Heart attack Father   . Heart disease Father   . Hyperlipidemia Father   . COPD Sister      Social History:  reports that she has never smoked. She has never used smokeless tobacco. She reports that she does not drink alcohol or use illicit drugs.  Allergies  Allergen Reactions  . Iohexol Anaphylaxis, Hives and Shortness Of Breath  . Codeine Itching  . Hepatitis B Vaccine   . 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 ALPRAZolam (XANAX) 0.5 MG tablet TAKE 1 TABLET 3 TIMES A DAY AS NEEDED FOR ANXIETY  90 tablet  0  . amitriptyline (ELAVIL) 25 MG tablet TAKE 1 TABLET (25 MG TOTAL) BY MOUTH AT BEDTIME.  30 tablet  2  . divalproex (DEPAKOTE) 250 MG DR tablet Change to 2 tablets in the morning and afternoon, 3 tablets in the evening  90 tablet  2  . gabapentin (NEURONTIN) 300 MG capsule TAKE 2 CAPSULES (600 MG TOTAL) BY MOUTH 3 (THREE) TIMES DAILY.  180 capsule  2  .  HYDROcodone-acetaminophen (NORCO/VICODIN) 5-325 MG per tablet Take 1 tablet by mouth every 12 (twelve) hours.  60 tablet  0  . hydroxychloroquine (PLAQUENIL) 200 MG tablet Take 1 tablet (200 mg total) by mouth 2 (two) times daily.  60 tablet  3  . levETIRAcetam (KEPPRA) 1000 MG tablet Take 1 tablet (1,000 mg total) by mouth 2 (two) times daily.  60 tablet  0  . lidocaine (XYLOCAINE) 2 % solution Use as directed 20 mLs in the mouth or throat as needed for mouth pain.  100 mL  0   Scheduled: . LORazepam         ROS:  History obtained from unobtainable from patient due to mental status   Blood pressure 114/75, pulse 97, resp. rate 16, SpO2 100.00%.   Neurologic Examination:                                                                                                      Mental Status: Intubated, recently received 9 Ativan and on Propofol. Does not respond to verbal stimuli.  Cranial Nerves: II: Discs flat bilaterally; no blink to threat, pupils equal, round, reactive to light and accommodation III,IV, VI: slightly dysconjugate with left eye outward compared with right.  V,VII: face symmetric, facial light touch sensation normal bilaterally VIII: unable to assess IX,X: gag reflex present XI: bilateral shoulder shrug XII: intubated  Motor: Flaccid throughout with exception of periods when her right arm flexes and right leg extends. Episodes last for about 5 minutes and resolve.  Since Propofol has been stopped episodes have become more frequent.  Sensory: no response to noxious stimuli Deep Tendon Reflexes:  Right: Upper Extremity   Left: Upper extremity   biceps (C-5 to C-6) 2/4   biceps (C-5 to C-6) 2/4 tricep (C7) 2/4    triceps (C7) 2/4 Brachioradialis (C6) 2/4  Brachioradialis (C6) 2/4  Lower Extremity Lower Extremity  quadriceps (L-2 to L-4)  2/4   quadriceps (L-2 to L-4) 2/4 Achilles (S1) 2/4   Achilles (S1) 2/4  Plantars: Right: downgoing   Left: downgoing Cerebellar: Unable to assess Gait: unable to assess CV: pulses palpable throughout    Lab Results: Basic Metabolic Panel: No results found for this basename: NA, K, CL, CO2, GLUCOSE, BUN, CREATININE, CALCIUM, MG, PHOS,  in the last 168 hours  Liver Function Tests: No results found for this basename: AST, ALT, ALKPHOS, BILITOT, PROT, ALBUMIN,  in the last 168 hours No results found for this basename: LIPASE, AMYLASE,  in the last 168 hours No results found for this basename: AMMONIA,  in the last 168 hours  CBC: No results found for this basename: WBC, NEUTROABS, HGB, HCT, MCV, PLT,  in the last 168 hours  Cardiac Enzymes: No results found for this basename: CKTOTAL, CKMB, CKMBINDEX, TROPONINI,  in the last 168 hours  Lipid Panel: No results found for this basename: CHOL, TRIG, HDL, CHOLHDL, VLDL, LDLCALC,  in the last 168 hours  CBG: No results found for this basename: GLUCAP,  in the last 168 hours  Microbiology: Results for orders placed during the hospital encounter of 03/14/14  MRSA PCR SCREENING     Status: None   Collection Time    03/15/14  5:55 AM      Result Value Ref Range Status   MRSA by PCR NEGATIVE  NEGATIVE Final   Comment:            The GeneXpert MRSA Assay (FDA     approved for NASAL specimens     only), is one component of a     comprehensive MRSA colonization     surveillance program. It is not     intended to diagnose MRSA  infection nor to guide or     monitor treatment for     MRSA infections.  CULTURE, BLOOD (ROUTINE X 2)     Status: None   Collection Time    03/15/14  9:15 AM      Result Value Ref Range Status   Specimen Description BLOOD RIGHT HAND   Final   Special Requests     Final   Value: BOTTLES DRAWN AEROBIC AND ANAEROBIC 10CC BLUE 5CC RED   Culture  Setup Time     Final   Value: 03/15/2014 14:13      Performed at [facility]   Culture     Final   Value: STAPHYLOCOCCUS SPECIES (COAGULASE NEGATIVE)     Note: THE SIGNIFICANCE OF ISOLATING THIS ORGANISM FROM A SINGLE SET OF BLOOD CULTURES WHEN MULTIPLE SETS ARE DRAWN IS UNCERTAIN. PLEASE NOTIFY THE MICROBIOLOGY DEPARTMENT WITHIN ONE WEEK IF SPECIATION AND SENSITIVITIES ARE REQUIRED.     Note: Gram Stain Report Called to,Read Back By and Verified With: RENA ISLEY 03/19/14 1430 BY LYONK REPORT FAXED BY REQUEST     Performed at [facility]   Report Status 03/19/2014 FINAL   Final  CULTURE, BLOOD (ROUTINE X 2)     Status: None   Collection Time    03/15/14 11:40 AM      Result Value Ref Range Status   Specimen Description BLOOD RIGHT HAND   Final   Special Requests BOTTLES DRAWN AEROBIC ONLY 2CC   Final   Culture  Setup Time     Final   Value: 03/15/2014 16:53     Performed at [facility]   Culture     Final   Value: NO GROWTH 5 DAYS     Performed at [facility]   Report Status 03/21/2014 FINAL   Final    Coagulation Studies: No results found for this basename: LABPROT, INR,  in the last 72 hours  Imaging: No results found.     Assessment and plan per attending neurologist  Felicie Mornavid Smith PA-C [facility] [phone]  05/28/2014, 5:11 PM   Assessment/Plan:  53 yo F with recurrent episodes of unclear etiology, possibly seizure. As described above, EEG recorded irregular delta activity during these episodes previously, making seizure less likely but not excluding it completely. 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 Currently being hooked up for long term monitoring for further evaluation of seizure like events.    1) Would treat episodes with benzodiazepines(could use versed drip) for the time being.  2) LTM EEG to caputre spells.  3) continue keppra 1gm BID and valproate 750mg  bid 4) keppra, depakote levels.  5) Will continue to follow.    Ritta SlotMcNeill Donja Tipping, MD [facility] [phone]  If 7pm- 7am, please page neurology on call as listed in AMION.

## 2014-05-29 ENCOUNTER — Inpatient Hospital Stay (HOSPITAL_COMMUNITY): Payer: Federal, State, Local not specified - PPO

## 2014-05-29 DIAGNOSIS — J9600 Acute respiratory failure, unspecified whether with hypoxia or hypercapnia: Secondary | ICD-10-CM

## 2014-05-29 DIAGNOSIS — Z765 Malingerer [conscious simulation]: Secondary | ICD-10-CM

## 2014-05-29 DIAGNOSIS — G4700 Insomnia, unspecified: Secondary | ICD-10-CM

## 2014-05-29 DIAGNOSIS — R569 Unspecified convulsions: Secondary | ICD-10-CM

## 2014-05-29 DIAGNOSIS — M352 Behcet's disease: Secondary | ICD-10-CM

## 2014-05-29 DIAGNOSIS — K219 Gastro-esophageal reflux disease without esophagitis: Secondary | ICD-10-CM

## 2014-05-29 LAB — CORTISOL: Cortisol, Plasma: 1.2 ug/dL

## 2014-05-29 LAB — GLUCOSE, CAPILLARY
GLUCOSE-CAPILLARY: 102 mg/dL — AB (ref 70–99)
GLUCOSE-CAPILLARY: 121 mg/dL — AB (ref 70–99)
Glucose-Capillary: 87 mg/dL (ref 70–99)

## 2014-05-29 LAB — COMPREHENSIVE METABOLIC PANEL
ALT: 11 U/L (ref 0–35)
AST: 14 U/L (ref 0–37)
Albumin: 2.9 g/dL — ABNORMAL LOW (ref 3.5–5.2)
Alkaline Phosphatase: 77 U/L (ref 39–117)
BUN: 9 mg/dL (ref 6–23)
CO2: 24 mEq/L (ref 19–32)
Calcium: 8.4 mg/dL (ref 8.4–10.5)
Chloride: 108 mEq/L (ref 96–112)
Creatinine, Ser: 0.73 mg/dL (ref 0.50–1.10)
GFR calc Af Amer: >90 mL/min (ref >90)
Glucose, Bld: 121 mg/dL — ABNORMAL HIGH (ref 70–99)
Potassium: 3.9 mEq/L (ref 3.7–5.3)
Sodium: 144 mEq/L (ref 137–147)
TOTAL PROTEIN: 5.7 g/dL — AB (ref 6.0–8.3)

## 2014-05-29 LAB — VALPROIC ACID LEVEL: Valproic Acid Lvl: 101.3 ug/mL — ABNORMAL HIGH (ref 50.0–100.0)

## 2014-05-29 MED ORDER — SODIUM CHLORIDE 0.9 % IV SOLN
100.0000 mg | Freq: Two times a day (BID) | INTRAVENOUS | Status: DC
  Administered 2014-05-29 – 2014-06-01 (×6): 100 mg via INTRAVENOUS
  Filled 2014-05-29 (×16): qty 10

## 2014-05-29 MED ORDER — BIOTENE DRY MOUTH MT LIQD
15.0000 mL | Freq: Four times a day (QID) | OROMUCOSAL | Status: DC
  Administered 2014-05-29 – 2014-06-01 (×7): 15 mL via OROMUCOSAL

## 2014-05-29 MED ORDER — SODIUM CHLORIDE 0.9 % IV SOLN
200.0000 mg | INTRAVENOUS | Status: AC
  Administered 2014-05-29: 200 mg via INTRAVENOUS
  Filled 2014-05-29 (×2): qty 20

## 2014-05-29 MED ORDER — CHLORHEXIDINE GLUCONATE 0.12 % MT SOLN
15.0000 mL | Freq: Two times a day (BID) | OROMUCOSAL | Status: DC
  Administered 2014-05-29 – 2014-06-01 (×6): 15 mL via OROMUCOSAL
  Filled 2014-05-29 (×7): qty 15

## 2014-05-29 MED ORDER — VALPROATE SODIUM 500 MG/5ML IV SOLN
1000.0000 mg | Freq: Once | INTRAVENOUS | Status: AC
  Administered 2014-05-29: 1000 mg via INTRAVENOUS
  Filled 2014-05-29: qty 10

## 2014-05-29 MED ORDER — MIDAZOLAM HCL 2 MG/2ML IJ SOLN
4.0000 mg | INTRAMUSCULAR | Status: DC | PRN
  Administered 2014-05-29: 4 mg via INTRAVENOUS
  Filled 2014-05-29: qty 4

## 2014-05-29 MED ORDER — VITAL HIGH PROTEIN PO LIQD
1000.0000 mL | ORAL | Status: DC
  Filled 2014-05-29 (×2): qty 1000

## 2014-05-29 MED ORDER — SODIUM CHLORIDE 0.9 % IV SOLN
INTRAVENOUS | Status: DC
  Administered 2014-05-29 – 2014-05-31 (×3): via INTRAVENOUS

## 2014-05-29 MED ORDER — PHENYLEPHRINE HCL 10 MG/ML IJ SOLN
30.0000 ug/min | INTRAMUSCULAR | Status: DC
  Administered 2014-05-29 (×2): 30 ug/min via INTRAVENOUS
  Filled 2014-05-29 (×3): qty 1

## 2014-05-29 MED ORDER — SODIUM CHLORIDE 0.9 % IV SOLN
200.0000 mg | Freq: Once | INTRAVENOUS | Status: AC
  Administered 2014-05-29: 200 mg via INTRAVENOUS
  Filled 2014-05-29: qty 20

## 2014-05-29 MED ORDER — VITAL HIGH PROTEIN PO LIQD
1000.0000 mL | ORAL | Status: DC
  Administered 2014-05-29: 1000 mL
  Filled 2014-05-29 (×2): qty 1000

## 2014-05-29 NOTE — Procedures (Addendum)
electroencephalogram report- LTM   Ordering Physician : Dr Arlana LindauKerkpatrick  EEG number: [id number]    Data acquisition: 10-20 electrode placement.  Additional T1, T2, and EKG electrodes; 26 channel digital referential acquisition reformatted to 18 channel/7 channel coronal bipolar     Spike detection: ON     Seizure detection: ON   Beginning time: 05/28/14 Ending time: 05/29/14  Day of study: day1    This 24 hours of intensive EEG monitoring with simultaneous video monitoring was performed for this patient with spells of right arm and leg jerking  as a part of ongoing series to capture events of interest and determine if these are seizures.    Medications: propofol, keppra,  ativan 10 mg prior EEG recoding    Automated spike detection program did not detect any spikes .  Seizure detection program did not detect any seizures .   The recording begins of background activity by 8 to 9 cps posterior dominant symmetric synchronized alpha rhythm which tends  to  attenuates with his eyes opening. Admixed beta frequencies as well as superimposed delta slowing gives background activity  disorganized appearance. As recording  progresses , theta and delta slowing appears to be more prominent with fragmentation of posterior dominant alpha rhythm . These follows by emergence of normal sleep pattern interrupted by 3-4 seconds of suppressed background likely coinciding with increased in sedation.   There was several  pushbutton activations events during this recording activated by staff due to right arm jerking and right leg jerking. Electrographicaly   background activities remain unchanged from baseline background activities prior during and after this events of interest..  There was no epileptiform discharges clinical or subclinical seizures present.    Clinical interpretation: This 24 hours of intensive EEG monitoring with simultaneous monitoring did not record any clinical or subclinical seizures.   Background activities slowing and disorganization as discussed above suggestive of mild encephalopathy and most likely related to increased in sedation.  Multiple events of interest were recorded and marked by right arm and right leg jerking.  There was no EEG correlation with  These events and was marked by  unchanged baseline background activities prior during and after this events of interest. These findings are suggested that this particular events of interest could  represent simple partial motor seizures which are frequently seen without any EEG changes.  However this spells also could represent other non epileptic etiologies. These findings were discussed with ordering  physician. Continuous monitoring is recommended during  discontinuation of sedation .    electroencephalogram report- LTM   Ordering Physician : Dr Arlana LindauKerkpatrick  EEG number: [id number]    Data acquisition: 10-20 electrode placement.  Additional T1, T2, and EKG electrodes; 26 channel digital referential acquisition reformatted to 18 channel/7 channel coronal bipolar     Spike detection: ON     Seizure detection: ON   Beginning time: 05/29/14 Ending time: 05/30/14  Day of study: day 2    This 24 hours of intensive EEG monitoring with simultaneous video monitoring was performed for this patient with spells of right arm and leg jerking  as a part of ongoing series to capture events of interest and determine if these are seizures.    Medications: propofol has been gradually weaned off, keppra,     Automated spike detection program did not detect any spikes .  Seizure detection program did not detect any seizures .   This study consisted of 2 parts.  Entire record was reviewed. Background  activity described by continuous predominantly theta and delta slowing without interhemispheric asymmetries focal abnormalities or epileptiform discharges. Assessment 5-7 cps with  posterior dominance, stability disorganized given  admixed  frequencies in the beta range as well as this generalized delta slowing.  Background activities are reactive to arousal and spontaneous movements.  At this time spent and activities appear to be faster attenuated with the posterior dominant 7 cps rhythm.   There were no spells of right arm/leg jerking or shaking reordered. However several spells of unresponsiveness were recorded lasing as long as 10 minutes and identified  on the event diary. Unfortunately the sound on corresponding video was not available and i cannnot comment event details. However during all of these events of "unrersponsiveness",  electrographic background activities are unchanged from baseline EEG activities. This events   Of unresponsiveness were not seizures.  Throughout the tracing patient was noted to have multiple episodes of purposeful movements including trying to get her restrains off.   There were no Epileptiform discharges clinical or subclinical seizures present.  Clinical interpretation:   This 24 hours of intensive EEG monitoring with simultaneous video monitoring did not record any right arm and right leg shaking.  Several episodes of prolonged unresponsiveness recorded identified on patient event log.  Electrographically her  EEG was  unchanged from baseline activities prior,  during and after thees events of interest.  The spells were not seizures.  Background activity was marked by  Background slowing and disorganization as discussed above  suggestive of mild encephalopathy of nonspecific etiology including toxic metabolic pharmacological or  multifocal degenerative etiologies, however most likely due to the poly pharmacy given her history.  Clinical correlation  is advised

## 2014-05-29 NOTE — Progress Notes (Signed)
INITIAL NUTRITION ASSESSMENT  DOCUMENTATION CODES Per approved criteria  -Not Applicable   INTERVENTION: Initiate Vital High Protein @ 30 ml/hr via NG tube and increase by 10 ml every 4 hours to goal rate of 50 ml/hr.   Tube feeding regimen provides 1200 kcal, 105 grams of protein, and 1003 ml of H2O.  TF regimen and current Propofol provides: 1691 total kcal (98% of estimated needs)  NUTRITION DIAGNOSIS: Inadequate oral intake related to inability to eat as evidenced by NPO status  Goal: Pt to meet >/= 90% of their estimated nutrition needs   Monitor:  TF initiation and tolerance, weight trend, labs  Reason for Assessment: Consult received to initiate and manage enteral nutrition support.  53 y.o. female  Admitting Dx: Status epilepticus  ASSESSMENT: [age] F with hx of suspected seizure disorder admitted with recurrent seizure or seizure like episodes eventually requiring intubation.  Per MD  Patient is currently intubated on ventilator support MV: 8 L/min Temp (24hrs), Avg:98.5 F (36.9 C), Min:97.8 F (36.6 C), Max:99.1 F (37.3 C)  Propofol: 18.6 ml/hr providing: 491 kcal per day from lipid Pt with NG tube in place. Tip of tube confirmed by xray and located in the proximal stomach. Per xray stool is seen throughout the colon concerning for constipation. Discussed with RN.   Nutrition Focused Physical Exam:  Subcutaneous Fat:  Orbital Region: WNL Upper Arm Region: WNL Thoracic and Lumbar Region: WNL  Muscle:  Temple Region: WNL Clavicle Bone Region: WNL Clavicle and Acromion Bone Region: WNL Scapular Bone Region: WNL Dorsal Hand: WNL Patellar Region: WNL Anterior Thigh Region: WNL Posterior Calf Region: WNL  Edema: not present   Height: Ht Readings from Last 1 Encounters:  03/16/14 5' 10.5" (1.791 m)    Weight: Wt Readings from Last 1 Encounters:  05/29/14 194 lb 0.1 oz (88 kg)  Usual body weight 189 lb (85.7 kg) -  Used for calculations  Ideal  Body Weight: 68.1 kg   % Ideal Body Weight: 126%  Wt Readings from Last 10 Encounters:  05/29/14 194 lb 0.1 oz (88 kg)  03/16/14 189 lb (85.73 kg)  03/13/14 190 lb (86.183 kg)  02/14/14 183 lb (83.008 kg)  11/30/13 190 lb (86.183 kg)  11/14/13 176 lb 12.8 oz (80.196 kg)  09/11/13 178 lb 9 oz (80.995 kg)  08/31/13 173 lb 9.6 oz (78.744 kg)  08/25/13 173 lb 3.2 oz (78.563 kg)  04/14/13 174 lb (78.926 kg)    Usual Body Weight: 189 lb   % Usual Body Weight: >100%  BMI:  Body mass index is 27.43 kg/(m^2).  Estimated Nutritional Needs: Kcal: 1726 Protein: 103-128 grams Fluid: >2 L/day  Skin: no issues noted  Diet Order:    EDUCATION NEEDS: -No education needs identified at this time   Intake/Output Summary (Last 24 hours) at 05/29/14 1008 Last data filed at 05/29/14 0900  Gross per 24 hour  Intake 1598.15 ml  Output   1415 ml  Net 183.15 ml    Last BM: PTA   Labs:   Recent Labs Lab 05/28/14 1824 05/29/14 0530  NA  --  144  K  --  3.9  CL  --  108  CO2  --  24  BUN  --  9  CREATININE  --  0.73  CALCIUM  --  8.4  MG 1.8  --   PHOS 2.9  --   GLUCOSE  --  121*    CBG (last 3)  No results found for  this basename: GLUCAP,  in the last 72 hours  Scheduled Meds: . antiseptic oral rinse  15 mL Mouth Rinse QID  . chlorhexidine  15 mL Mouth Rinse BID  . famotidine (PEPCID) IV  20 mg Intravenous Q12H  . feeding supplement (VITAL HIGH PROTEIN)  1,000 mL Per Tube Q24H  . heparin  5,000 Units Subcutaneous 3 times per day  . lacosamide (VIMPAT) IV  100 mg Intravenous Q12H  . lacosamide (VIMPAT) IV  200 mg Intravenous STAT  . levETIRAcetam  1,500 mg Intravenous Q12H  . valproate sodium  1,000 mg Intravenous Once  . valproate sodium  750 mg Intravenous Q12H    Continuous Infusions: . sodium chloride    . midazolam (VERSED) infusion Stopped (05/28/14 1947)  . propofol 35 mcg/kg/min (05/29/14 0800)    Past Medical History  Diagnosis Date  . GERD  (gastroesophageal reflux disease)   . Hyperlipidemia   . History of blood transfusion   . Chronic headaches   . Phlebitis   . History of colonic polyps   . 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 Tonic clonic seizures   . Anxiety     "situational stress"    Past Surgical History  Procedure Laterality Date  . Cholecystectomy    . Tonsillectomy  1970  . Appendectomy  1975  . Lumbar fusion  1999    L5-S1  . Back surgery    . Abdominal hysterectomy  1988  . Portacath placement Left 2008  . Stone extraction with basket      Kendell BaneHeather Pitts RD, LDN, CNSC [phone] Pager [phone] After Hours Pager

## 2014-05-29 NOTE — Progress Notes (Signed)
PULMONARY / CRITICAL CARE MEDICINE   Name: Brenda MannanWanda Krontz MRN: [id number] DOB: [birth year]    ADMISSION DATE:  05/28/2014  PRIMARY SERVICE: PCCM  BRIEF PATIENT DESCRIPTION:  89 F with hx of suspected seizure disorder presented [facility] ED with recurrent seizure or seizure like episodes eventually requiring intubation and [facility].   SIGNIFICANT EVENTS / STUDIES:  6/15 CT head: No definite acute intracranial abnormalities 6/15 EEG:   LINES / TUBES: L [place] Port a cath (longstanding) ETT 6/15 >>   CULTURES:   ANTIBIOTICS:  SUBJECTIVE: WUA , aggitation  VITAL SIGNS: Temp:  [97.8 F (36.6 C)-99.1 F (37.3 C)] 97.8 F (36.6 C) (06/16 0726) Pulse Rate:  [66-111] 82 (06/16 0800) Resp:  [13-28] 13 (06/16 0800) BP: (86-140)/(57-113) 114/76 mmHg (06/16 0800) SpO2:  [98 %-100 %] 100 % (06/16 0800) FiO2 (%):  [40 %] 40 % (06/16 0800) Weight:  [88 kg (194 lb 0.1 oz)-88.5 kg (195 lb 1.7 oz)] 88 kg (194 lb 0.1 oz) (06/16 0500) HEMODYNAMICS:   VENTILATOR SETTINGS: Vent Mode:  [-] PSV;CPAP FiO2 (%):  [40 %] 40 % Set Rate:  [16 bmp] 16 bmp Vt Set:  [500 mL] 500 mL PEEP:  [5 cmH20] 5 cmH20 Pressure Support:  [10 cmH20] 10 cmH20 Plateau Pressure:  [16 cmH20-17 cmH20] 16 cmH20 INTAKE / OUTPUT: Intake/Output     06/15 0701 - 06/16 0700 06/16 0701 - 06/17 0700   I.V. (mL/kg) 1033.5 (11.7) 262.2 (3)   IV Piggyback 257.5 45   Total Intake(mL/kg) 1291 (14.7) 307.2 (3.5)   Urine (mL/kg/hr) 1125 290 (1.2)   Total Output 1125 290   Net +166 +17.2        Emesis Occurrence 600 x      PHYSICAL EXAMINATION: General:  Intubated, sedated, rass 1 Neuro:  rass 1, moving all ext HEENT:  perr Cardiovascular:  RRR s M Lungs:  Clear Abdomen: Soft, NT, NABS Ext: warm, no edema  LABS:  CBC  Recent Labs Lab 05/28/14 1824  WBC 6.7  HGB 10.5*  HCT 31.5*  PLT 230   Coag's No results found for this basename: APTT, INR,  in the last 168  hours BMET  Recent Labs Lab 05/29/14 0530  NA 144  K 3.9  CL 108  CO2 24  BUN 9  CREATININE 0.73  GLUCOSE 121*   Electrolytes  Recent Labs Lab 05/28/14 1824 05/29/14 0530  CALCIUM  --  8.4  MG 1.8  --   PHOS 2.9  --    Sepsis Markers No results found for this basename: LATICACIDVEN, PROCALCITON, O2SATVEN,  in the last 168 hours ABG No results found for this basename: PHART, PCO2ART, PO2ART,  in the last 168 hours Liver Enzymes  Recent Labs Lab 05/29/14 0530  AST 14  ALT 11  ALKPHOS 77  BILITOT <0.2*  ALBUMIN 2.9*   Cardiac Enzymes No results found for this basename: TROPONINI, PROBNP,  in the last 168 hours Glucose No results found for this basename: GLUCAP,  in the last 168 hours   CXR: wnl, atx  ASSESSMENT / PLAN:  PULMONARY A:  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 P:   Status, high needs sedation, unbale to sbt ABg reviewed, keep same MV If able to reduce sedation will consider SBT attempt, cpap 5 ps 5 Repeat pcxr for atx am  CARDIOVASCULAR A:  Transient hypotension related to sedation - resolved, no evidence ischemia P:  May need to access porta cath, cvp if drops  BP Allow pos  Balance tele  RENAL A:   status P:   Chem in am  saline  GASTROINTESTINAL A:   No issues P:   SUP: IV famotidine seizure this am , no extubation, feed  HEMATOLOGIC A:   No issues P:  DVT px: SQ heparin Cbc in am   INFECTIOUS A:   No issues P:   Micro and abx   ENDOCRINE A:   No issues P:   Monitor glu on chem panels Initiate SSI for glu > 180  NEUROLOGIC A:   Status epilepticus P:   Midaz infusion off PRN fentanyl RASS goal -2 Continuous EEG Daily WUA Propofol, support bP if needed with line / pressors Seizure this am, neuro escalating therapy  TODAY'S SUMMARY: seizure am, no extubation, start feeds, cont eeg   I have personally obtained a history, examined the patient, evaluated laboratory and imaging results, formulated the  assessment and plan and placed orders. CRITICAL CARE: The patient is critically ill with multiple organ systems failure and requires high complexity decision making for assessment and support, frequent evaluation and titration of therapies, application of advanced monitoring technologies and extensive interpretation of multiple databases. Critical Care Time devoted to patient care services described in this note is 35 minutes.   Mcarthur Rossettianiel J. Tyson AliasFeinstein, MD, FACP Pgr: [phone] [facility]

## 2014-05-29 NOTE — Procedures (Signed)
Central Venous Catheter Insertion Procedure Note Brenda MannanWanda Mcguire [id number] 1961/10/14  Procedure: Insertion of Central Venous Catheter Indications: Drug and/or fluid administration  Procedure Details Consent: Risks of procedure as well as the alternatives and risks of each were explained to the (patient/caregiver).  Consent for procedure obtained. Time Out: Verified patient identification, verified procedure, site/side was marked, verified correct patient position, special equipment/implants available, medications/allergies/relevent history reviewed, required imaging and test results available.  Performed  Maximum sterile technique was used including antiseptics, cap, gloves, gown, hand hygiene, mask and sheet. Skin prep: Chlorhexidine; local anesthetic administered A antimicrobial bonded/coated triple lumen catheter was placed in the right internal jugular vein using the Seldinger technique.  Evaluation Blood flow good Complications: No apparent complications Patient did tolerate procedure well. Chest X-ray ordered to verify placement.  CXR: pending.  Nelda BucksFEINSTEIN,DANIEL J. 05/29/2014, 1:24 PM  US  Tolerated Not enough access, in spite of port  Mcarthur Rossettianiel J. Tyson AliasFeinstein, MD, FACP Pgr: [phone] [place]

## 2014-05-29 NOTE — Progress Notes (Signed)
LTM EEG restarted for day 2; to be read by NEN.

## 2014-05-29 NOTE — Progress Notes (Signed)
Subjective: intermittetnly followign commands. INtermittently with right arm and leg increased tone without followign commands.   Exam: Filed Vitals:   05/29/14 0800  BP: 114/76  Pulse: 82  Temp:   Resp: 13   Gen: In bed, NAD MS: does not follow commands at the time of my exam. Does try to sit up . ZO:XWRUECN:PERRL, does nto fixate or track.  Motor: purposeful movements seen bilaterally.  Sensory:responds to nox stim x 4.    Impression: 53 yo F with recurrent spells of right arm and leg stiffening. Treated with benzodiazepines(received 9mg  Ativan). Loaded with additional keppra. EEG read pending, would favor treating as seizures until this read returns.   Recommendations: 1) Extra dose of depakote this AM 2) Continue keppra 1500mg  BID 3) will give one time dose of vimpat 200mg (during infusion, pt moved and dislodged IV. Unclear how much vimpat was received, but can give up to 400mg  IV in a single dose. I would favor repeating 200mg  so that it is known that she at least gets 200mg ). 4) vimpat 100mg  BId.   Ritta SlotMcNeill Kirkpatrick, MD [facility] [phone]  If 7pm- 7am, please page neurology on call as listed in AMION.

## 2014-05-30 ENCOUNTER — Inpatient Hospital Stay (HOSPITAL_COMMUNITY): Payer: Federal, State, Local not specified - PPO

## 2014-05-30 LAB — COMPREHENSIVE METABOLIC PANEL
ALBUMIN: 3.4 g/dL — AB (ref 3.5–5.2)
ALK PHOS: 81 U/L (ref 39–117)
ALT: 11 U/L (ref 0–35)
AST: 13 U/L (ref 0–37)
BUN: 6 mg/dL (ref 6–23)
CHLORIDE: 107 meq/L (ref 96–112)
CO2: 24 mEq/L (ref 19–32)
Calcium: 9.2 mg/dL (ref 8.4–10.5)
Creatinine, Ser: 0.65 mg/dL (ref 0.50–1.10)
GFR calc Af Amer: >90 mL/min (ref >90)
GFR calc non Af Amer: >90 mL/min (ref >90)
GLUCOSE: 113 mg/dL — AB (ref 70–99)
POTASSIUM: 3.9 meq/L (ref 3.7–5.3)
SODIUM: 145 meq/L (ref 137–147)
TOTAL PROTEIN: 6.9 g/dL (ref 6.0–8.3)
Total Bilirubin: 0.3 mg/dL (ref 0.3–1.2)

## 2014-05-30 LAB — CBC WITH DIFFERENTIAL/PLATELET
BASOS ABS: 0 10*3/uL (ref 0.0–0.1)
BASOS PCT: 0 % (ref 0–1)
EOS ABS: 0.4 10*3/uL (ref 0.0–0.7)
EOS PCT: 4 % (ref 0–5)
HEMATOCRIT: 32.9 % — AB (ref 36.0–46.0)
Hemoglobin: 10.9 g/dL — ABNORMAL LOW (ref 12.0–15.0)
Lymphocytes Relative: 20 % (ref 12–46)
Lymphs Abs: 1.8 10*3/uL (ref 0.7–4.0)
MCH: 30.5 pg (ref 26.0–34.0)
MCHC: 33.1 g/dL (ref 30.0–36.0)
MCV: 92.2 fL (ref 78.0–100.0)
MONO ABS: 0.8 10*3/uL (ref 0.1–1.0)
Monocytes Relative: 9 % (ref 3–12)
Neutro Abs: 6.3 10*3/uL (ref 1.7–7.7)
Neutrophils Relative %: 67 % (ref 43–77)
Platelets: 263 10*3/uL (ref 150–400)
RBC: 3.57 MIL/uL — ABNORMAL LOW (ref 3.87–5.11)
RDW: 13.1 % (ref 11.5–15.5)
WBC: 9.4 10*3/uL (ref 4.0–10.5)

## 2014-05-30 LAB — GLUCOSE, CAPILLARY
GLUCOSE-CAPILLARY: 105 mg/dL — AB (ref 70–99)
GLUCOSE-CAPILLARY: 112 mg/dL — AB (ref 70–99)
GLUCOSE-CAPILLARY: 125 mg/dL — AB (ref 70–99)
GLUCOSE-CAPILLARY: 126 mg/dL — AB (ref 70–99)
Glucose-Capillary: 103 mg/dL — ABNORMAL HIGH (ref 70–99)
Glucose-Capillary: 120 mg/dL — ABNORMAL HIGH (ref 70–99)
Glucose-Capillary: 132 mg/dL — ABNORMAL HIGH (ref 70–99)

## 2014-05-30 LAB — URINE CULTURE

## 2014-05-30 NOTE — Progress Notes (Signed)
LTM EEG D/C'd

## 2014-05-30 NOTE — Procedures (Signed)
Extubation Procedure Note  Patient Details:   Name: Brenda Mcguire DOB: 01/27/1961 MRN: [id number]   Airway Documentation:  Airway 8 mm (Active)  Secured at (cm) 23 cm 05/30/2014  8:00 AM  Measured From Lips 05/30/2014  8:00 AM  Secured Location Center 05/30/2014  8:00 AM  Secured By [employer] Tube Holder 05/30/2014  8:00 AM  Tube Holder Repositioned Yes 05/29/2014 11:48 AM  Cuff Pressure (cm H2O) 24 cm H2O 05/29/2014  7:26 AM  Site Condition Dry 05/30/2014  8:00 AM    Evaluation  O2 sats: stable throughout Complications: No apparent complications Patient did tolerate procedure well. Bilateral Breath Sounds: Clear Suctioning: Airway Yes extubated per Dr. Jarrett Ablesrders.  Patient placed on 2L [place]  Tyyne Cliett, Rudy JewBecky V 05/30/2014, 8:53 AM

## 2014-05-30 NOTE — Progress Notes (Signed)
PULMONARY / CRITICAL CARE MEDICINE   Name: Brenda Mcguire MRN: [id number] DOB: 1961-03-05    ADMISSION DATE:  05/28/2014  PRIMARY SERVICE: PCCM  BRIEF PATIENT DESCRIPTION:  [age] F with hx of suspected seizure disorder presented to [facility] ED with recurrent seizure or seizure-like episodes, eventually requiring intubation and [facility].    SIGNIFICANT EVENTS / STUDIES:  6/15 - Intubated 6/15 - CT head: No definite acute intracranial abnormalities 6/15 - EEG>>> 6/17 - Extubated  LINES / TUBES: L [place] Port a cath (longstanding) ETT 6/15 >>   CULTURES:   ANTIBIOTICS:  SUBJECTIVE: Plan to extubate today.  Off sedation, patient is mildly agitated with ETT, following commands, good respiratory drive.   VITAL SIGNS: Temp:  [97.2 F (36.2 C)-98.5 F (36.9 C)] 98.3 F (36.8 C) (06/17 0345) Pulse Rate:  [40-107] 40 (06/17 0759) Resp:  [16-23] 16 (06/17 0759) BP: (89-144)/(52-92) 144/67 mmHg (06/17 0759) SpO2:  [94 %-100 %] 100 % (06/17 0759) FiO2 (%):  [40 %] 40 % (06/17 0800) Weight:  [89 kg (196 lb 3.4 oz)] 89 kg (196 lb 3.4 oz) (06/17 0400) HEMODYNAMICS:   VENTILATOR SETTINGS: Vent Mode:  [-] PRVC FiO2 (%):  [40 %] 40 % Set Rate:  [16 bmp] 16 bmp Vt Set:  [500 mL] 500 mL PEEP:  [5 cmH20] 5 cmH20 Pressure Support:  [10 cmH20] 10 cmH20 Plateau Pressure:  [14 cmH20-17 cmH20] 14 cmH20 INTAKE / OUTPUT: Intake/Output     06/16 0701 - 06/17 0700 06/17 0701 - 06/18 0700   I.V. (mL/kg) 1446.7 (16.3) 10 (0.1)   NG/GT 380 30   IV Piggyback 495    Total Intake(mL/kg) 2321.7 (26.1) 40 (0.4)   Urine (mL/kg/hr) 3735 (1.7) 425 (2.8)   Total Output 3735 425   Net -1413.3 -385         PHYSICAL EXAMINATION: General:  White woman, lying in bed, NAD. Extubated. Neuro:  RASS -1, mildly drowsy. Slightly disoriented.  Following commands and mental status is improving.  HEENT:  PERRL, oral mucosa dry post-extubation.  Cardiovascular:  RRR, no peripheral edema  noted, no cyanosis or clubbing. Lungs:  CTAB, resp even and unlabored. Abdomen: Soft, NT, non-distended. BS + Ext: Warm, no edema noted. No deformities.  LABS:  CBC  Recent Labs Lab 05/28/14 1824 05/30/14 0500  WBC 6.7 9.4  HGB 10.5* 10.9*  HCT 31.5* 32.9*  PLT 230 263   Coag's No results found for this basename: APTT, INR,  in the last 168 hours BMET  Recent Labs Lab 05/29/14 0530 05/30/14 0500  NA 144 145  K 3.9 3.9  CL 108 107  CO2 24 24  BUN 9 6  CREATININE 0.73 0.65  GLUCOSE 121* 113*   Electrolytes  Recent Labs Lab 05/28/14 1824 05/29/14 0530 05/30/14 0500  CALCIUM  --  8.4 9.2  MG 1.8  --   --   PHOS 2.9  --   --    Sepsis Markers No results found for this basename: LATICACIDVEN, PROCALCITON, O2SATVEN,  in the last 168 hours ABG No results found for this basename: PHART, PCO2ART, PO2ART,  in the last 168 hours Liver Enzymes  Recent Labs Lab 05/29/14 0530 05/30/14 0500  AST 14 13  ALT 11 11  ALKPHOS 77 81  BILITOT <0.2* 0.3  ALBUMIN 2.9* 3.4*   Cardiac Enzymes No results found for this basename: TROPONINI, PROBNP,  in the last 168 hours Glucose  Recent Labs Lab 05/29/14 1333 05/29/14 1538 05/29/14  1948 05/30/14 0012 05/30/14 0344 05/30/14 0729  GLUCAP 87 102* 121* 126* 125* 132*   CXR: WNL, ATX  ASSESSMENT / PLAN:  PULMONARY A:  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 P:   - wean cpap5 ps 5, goal 30 min , assess neurostatus after wua -no new abg required -assess cough, resp strength - Rpt PCXR  CARDIOVASCULAR A:  Transient hypotension related to sedation - resolved, no evidence ischemia P:  - [name] for hypotension d/t propofol, titrate off as propofol to off - Tele -cvp  RENAL A:   Status neg balance on own P:   - Rpt BMet - NS kvo  GASTROINTESTINAL A:   No issues P:   - SUP: IV famotidine - D/C NG tube - Bedside swallow test due to neuro involvement - Restart diet as tolerated  HEMATOLOGIC A:   No issues P:   - DVT px: SQ heparin - Rpt CBC in am  INFECTIOUS A:   No issues P:   - Micro and abx   ENDOCRINE A:   No issues P:   - Monitor glu on chem panels - Initiate SSI for glu > 180  NEUROLOGIC A:   Status epilepticus?? psuedo? P:   - PRN fentanyl - D/C propofol - RASS goal 0  - Continuous EEG today, complete tomorrow per neuro - Continue vimpat, keppra  TODAY'S SUMMARY: Extubated, tolerated well.  Less agitated.  Plan to pull NG and restart diet as tolerated provided she passes bedside swallow test and mental status continues to be improved. Neuro to stop continuous EEG tomorrow.  Elenore PaddyStephanie M. Reese, PA-S  Ccm time 30 min   I have fully examined this patient and agree with above findings.    And edited in ful  Mcarthur RossettiDaniel J. Tyson AliasFeinstein, MD, FACP Pgr: [phone]  Pulmonary & Critical Care

## 2014-05-30 NOTE — Progress Notes (Signed)
NEURO HOSPITALIST PROGRESS NOTE   SUBJECTIVE:                                                                                                                        Extubated. Stated that she feel " cold" but offers no neurological complains. c-EEG monitoring with video x 24 hours: multiple events of interest were recorded and marked by right arm and right leg jerking. There was no EEG correlation. On vimpat 200 mg BID, keppra 1,500 mg BID, and Depacon 750 mg BID. 6/16 VPA level 101.3  OBJECTIVE:                                                                                                                           Vital signs in last 24 hours: Temp:  [97.2 F (36.2 C)-98.5 F (36.9 C)] 98.3 F (36.8 C) (06/17 0345) Pulse Rate:  [40-107] 56 (06/17 0800) Resp:  [16-23] 16 (06/17 0800) BP: (89-144)/(52-76) 134/69 mmHg (06/17 0800) SpO2:  [94 %-100 %] 100 % (06/17 0800) FiO2 (%):  [40 %] 40 % (06/17 0800) Weight:  [89 kg (196 lb 3.4 oz)] 89 kg (196 lb 3.4 oz) (06/17 0400)  Intake/Output from previous day: 06/16 0701 - 06/17 0700 In: 2321.7 [I.V.:1446.7; NG/GT:380; IV Piggyback:495] Out: 3735 [Urine:3735] Intake/Output this shift: Total I/O In: 40 [I.V.:10; NG/GT:30] Out: 425 [Urine:425] Nutritional status: NPO  Past Medical History  Diagnosis Date  . GERD (gastroesophageal reflux disease)   . Hyperlipidemia   . History of blood transfusion   . Chronic headaches   . Phlebitis   . History of colonic polyps   . 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 Tonic clonic seizures   . Anxiety     "situational stress"    Neurologic Exam:  Mental Status: Alert, oriented, thought content appropriate.  Speech fluent without evidence of aphasia.  Able to follow 3 step commands without difficulty. Cranial Nerves: II: Discs flat bilaterally; Visual fields grossly normal, pupils equal, round, reactive to light and accommodation III,IV, VI: ptosis  not present, extra-ocular motions intact bilaterally V,VII: smile symmetric, facial light touch sensation normal bilaterally VIII: hearing normal bilaterally IX,X: gag reflex present XI: bilateral shoulder shrug XII: midline tongue extension  without atrophy or fasciculations Motor: Moves all limbs symmetrically. Tone and bulk:normal tone throughout; no atrophy noted Sensory: Pinprick and light touch intact throughout, bilaterally Deep Tendon Reflexes:  Right: Upper Extremity   Left: Upper extremity   biceps (C-5 to C-6) 2/4   biceps (C-5 to C-6) 2/4 tricep (C7) 2/4    triceps (C7) 2/4 Brachioradialis (C6) 2/4  Brachioradialis (C6) 2/4  Lower Extremity Lower Extremity  quadriceps (L-2 to L-4) 2/4   quadriceps (L-2 to L-4) 2/4 Achilles (S1) 2/4   Achilles (S1) 2/4  Plantars: Right: downgoing   Left: downgoing Cerebellar: normal finger-to-nose,  normal heel-to-shin test Gait:  No tested    Lab Results: Lab Results  Component Value Date/Time   CHOL 234* 03/02/2013 11:15 AM   Lipid Panel No results found for this basename: CHOL, TRIG, HDL, CHOLHDL, VLDL, LDLCALC,  in the last 72 hours  Studies/Results: Dg Chest Port 1 View  05/30/2014   CLINICAL DATA:  Check endotracheal tube  EXAM: PORTABLE CHEST - 1 VIEW  COMPARISON:  05/29/2014  FINDINGS: Endotracheal tube is again noted 1.8 cm above the carina. A right jugular central line and left chest wall port are again seen and stable. Nasogastric catheter courses into the proximal esophagus and also lies at approximately the level of the aortic knob this is been significantly withdrawn when compared with the prior exam. Bibasilar atelectasis is again noted.  IMPRESSION: Stable bibasilar atelectasis.  Tubes and lines as described above. The nasogastric catheter has been withdrawn significantly from the prior exam and now lies at the level of the aortic knob in the proximal esophagus.   Electronically Signed   By: Alcide CleverMark  Lukens M.D.   On:  05/30/2014 08:18   Dg Chest Port 1 View  05/29/2014   CLINICAL DATA:  Internal jugular catheter placement  EXAM: PORTABLE CHEST - 1 VIEW  COMPARISON:  Study obtained earlier in the day  FINDINGS: Endotracheal tube tip is 2.6 cm above the carina. Right jugular catheter tip is in the superior vena cava near the cavoatrial junction. Port-A-Cath tip is at the cavoatrial junction. Nasogastric tube tip and side port are below the diaphragm. No pneumothorax. There is patchy bibasilar atelectatic change. Lungs are otherwise clear. Heart size and pulmonary vascularity are normal. No adenopathy.  IMPRESSION: Tube and catheter positions as described without pneumothorax. Lower lobe atelectatic change bilaterally.   Electronically Signed   By: Bretta BangWilliam  Woodruff M.D.   On: 05/29/2014 14:37   Dg Chest Port 1 View  05/29/2014   CLINICAL DATA:  Respiratory failure, followup  EXAM: PORTABLE CHEST - 1 VIEW  COMPARISON:  Portable exam 0606 hr compared to 05/28/2014  FINDINGS: Tip of endotracheal tube projects 1.7 cm above carina.  Nasogastric tube extends into stomach.  LEFT subclavian Port-A-Cath with tip projecting over cavoatrial junction.  Normal heart size, mediastinal contours, and pulmonary vascularity.  Low lung volumes with bibasilar atelectasis.  Lungs otherwise clear.  No gross pleural effusion or pneumothorax.  IMPRESSION: Low lung volumes with bibasilar atelectasis.   Electronically Signed   By: Ulyses SouthwardMark  Boles M.D.   On: 05/29/2014 08:09   Dg Abd Portable 1v  05/29/2014   CLINICAL DATA:  Nasogastric tube position.  EXAM: PORTABLE ABDOMEN - 1 VIEW  COMPARISON:  None.  FINDINGS: Nasogastric tube tip is seen in proximal stomach. Stool is seen throughout the colon concerning for constipation. No definite small bowel dilatation is noted. Status post surgical stabilization of lower lumbar spine. Phleboliths are noted in  the pelvis.  IMPRESSION: Nasogastric tube tip is seen in proximal stomach. Stool seen throughout the  colon concerning for constipation.   Electronically Signed   By: Roque Lias M.D.   On: 05/29/2014 12:32    MEDICATIONS                                                                                                                       I have reviewed the patient's current medications.  ASSESSMENT/PLAN:                                                                                                           53 yo F with recurrent spells of right arm and leg stiffening despite triple anticonvulsant regimen. She sustained multiple typical events recorded on c-EEG with video but there was not EEG correlate. Furthermore, interictal EEG showed no epileptiform discharges but slowing and disorganization suggestive of mild encephalopathy and most likely related to sedation. ? 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 Will discontinue c-EEG. Continue current AED for now. Will follow up   Wyatt Portela, MD [facility] [phone]  05/30/2014, 10:14 AM

## 2014-05-30 NOTE — Progress Notes (Signed)
Tube feed stopped at 0800 in anticipation of extubation.  Propofol running at 8830mcg/kg/min.  [name]-synephrine at 30 mcg/min. DUPONT, CONSTANCE C

## 2014-05-30 NOTE — Progress Notes (Signed)
Pts husband took the pts 2 rings home with him.

## 2014-05-30 NOTE — Progress Notes (Signed)
Propofol turned off in anticipation of extubation at 08:30.  [name] turned of at 0930.  Patient is stable on 2L [place]. Brenda Mcguire, Brenda Mcguire

## 2014-05-30 NOTE — Evaluation (Signed)
Physical Therapy Evaluation Patient Details Name: Brenda Mcguire MRN: [id number] DOB: 18-Nov-1961 Today's Date: 05/30/2014   History of Present Illness  [age] F with hx of suspected seizure disorder presented to [facility] ED with recurrent seizure or seizure-like episodes, eventually requiring intubation and transfer to [facility] ICU, extubated 6/16.  Clinical Impression  Patient demonstrates deficits in functional mobility as indicated below. Will need continued skilled PT to address deficits and maximize function. Will see as indicated and progress as tolerated. Recommend HHPT with 24/7 supervision/assist initially, if assist can not be provided may need ST placement.    Follow Up Recommendations Home health PT;Supervision/Assistance - 24 hour    Equipment Recommendations  Rolling walker with 5" wheels    Recommendations for Other Services       Precautions / Restrictions Precautions Precautions: Fall      Mobility  Bed Mobility Overal bed mobility: Needs Assistance Bed Mobility: Supine to Sit;Sit to Supine     Supine to sit: Min assist Sit to supine: Min assist   General bed mobility comments: Increased time, cues for positioning and safety  Transfers Overall transfer level: Needs assistance Equipment used: 1 person hand held assist Transfers: Sit to/from Stand Sit to Stand: Min guard         General transfer comment: Assist for stability  Ambulation/Gait Ambulation/Gait assistance: Mod assist Ambulation Distance (Feet): 150 Feet Assistive device: 1 person hand held assist Gait Pattern/deviations: Step-through pattern;Decreased stride length;Shuffle;Narrow base of support;Scissoring Gait velocity: decreased Gait velocity interpretation: Below normal speed for age/gender General Gait Details: patient very unsteady with ambulation, multiple LOB requiring increased physical assist to prevent fall.  Stairs            Wheelchair Mobility     Modified Rankin (Stroke Patients Only)       Balance Overall balance assessment: Needs assistance Sitting-balance support: Feet supported Sitting balance-Leahy Scale: Fair     Standing balance support: During functional activity Standing balance-Leahy Scale: Poor Standing balance comment: increased sway poor control of static stance                             Pertinent Vitals/Pain No pain at this time, VSS    Home Living Family/patient expects to be discharged to:: Private residence Living Arrangements: Spouse/significant other Available Help at Discharge:  (unsure) Type of Home: House Home Access: Stairs to enter Entrance Stairs-Rails: Can reach both Entrance Stairs-Number of Steps: 16 Home Layout: One level   Additional Comments: pt appears unreliable as historian     Prior Function Level of Independence: Independent               Hand Dominance   Dominant Hand: Right    Extremity/Trunk Assessment               Lower Extremity Assessment: Generalized weakness         Communication      Cognition Arousal/Alertness: Awake/alert Behavior During Therapy: Flat affect Overall Cognitive Status: Impaired/Different from baseline Area of Impairment: Attention;Following commands;Safety/judgement;Awareness;Problem solving   Current Attention Level: Selective   Following Commands: Follows one step commands consistently Safety/Judgement: Decreased awareness of safety Awareness: Emergent Problem Solving: Slow processing;Difficulty sequencing;Requires verbal cues;Requires tactile cues General Comments: pt initially stated that she had "lived alone" but later in session stated she lived with husband, then stated that she had no one to help her at home." Question reliability and cognition skills at this time  General Comments      Exercises        Assessment/Plan    PT Assessment Patient needs continued PT services  PT Diagnosis  Difficulty walking;Abnormality of gait;Generalized weakness   PT Problem List Decreased strength;Decreased activity tolerance;Decreased balance;Decreased mobility;Decreased coordination;Decreased cognition;Decreased knowledge of use of DME;Decreased safety awareness  PT Treatment Interventions DME instruction;Gait training;Stair training;Functional mobility training;Therapeutic activities;Therapeutic exercise;Balance training;Patient/family education   PT Goals (Current goals can be found in the Care Plan section) Acute Rehab PT Goals Patient Stated Goal: to go home PT Goal Formulation: With patient Time For Goal Achievement: 06/13/14 Potential to Achieve Goals: Good    Frequency Min 4X/week   Barriers to discharge        Co-evaluation               End of Session Equipment Utilized During Treatment: Gait belt Activity Tolerance: Patient limited by fatigue Patient left: in bed;with call bell/phone within reach;with bed alarm set Nurse Communication: Mobility status         Time: [id number] PT Time Calculation (min): 26 min   Charges:   PT Evaluation $Initial PT Evaluation Tier I: 1 Procedure PT Treatments $Gait Training: 8-22 mins $Therapeutic Activity: 8-22 mins   PT G CodesFabio Asa:          Gena Laski J 05/30/2014, 4:32 PM Charlotte Crumbevon Larin Depaoli, PT DPT  [phone]

## 2014-05-31 ENCOUNTER — Encounter (HOSPITAL_COMMUNITY): Payer: Self-pay | Admitting: *Deleted

## 2014-05-31 DIAGNOSIS — R569 Unspecified convulsions: Secondary | ICD-10-CM

## 2014-05-31 LAB — BASIC METABOLIC PANEL
BUN: 9 mg/dL (ref 6–23)
CO2: 23 mEq/L (ref 19–32)
Calcium: 9.2 mg/dL (ref 8.4–10.5)
Chloride: 106 mEq/L (ref 96–112)
Creatinine, Ser: 0.71 mg/dL (ref 0.50–1.10)
Glucose, Bld: 88 mg/dL (ref 70–99)
POTASSIUM: 4.1 meq/L (ref 3.7–5.3)
SODIUM: 144 meq/L (ref 137–147)

## 2014-05-31 LAB — CBC
HEMATOCRIT: 31.3 % — AB (ref 36.0–46.0)
HEMOGLOBIN: 10.1 g/dL — AB (ref 12.0–15.0)
MCH: 29.7 pg (ref 26.0–34.0)
MCHC: 32.3 g/dL (ref 30.0–36.0)
MCV: 92.1 fL (ref 78.0–100.0)
Platelets: 233 10*3/uL (ref 150–400)
RBC: 3.4 MIL/uL — ABNORMAL LOW (ref 3.87–5.11)
RDW: 13 % (ref 11.5–15.5)
WBC: 6.2 10*3/uL (ref 4.0–10.5)

## 2014-05-31 LAB — GLUCOSE, CAPILLARY
GLUCOSE-CAPILLARY: 94 mg/dL (ref 70–99)
GLUCOSE-CAPILLARY: 94 mg/dL (ref 70–99)
Glucose-Capillary: 90 mg/dL (ref 70–99)
Glucose-Capillary: 85 mg/dL (ref 70–99)

## 2014-05-31 LAB — LEVETIRACETAM LEVEL: LEVETIRACETAM: 57.5 ug/mL

## 2014-05-31 MED ORDER — TRAMADOL HCL 50 MG PO TABS
50.0000 mg | ORAL_TABLET | Freq: Four times a day (QID) | ORAL | Status: DC | PRN
  Administered 2014-05-31 – 2014-06-01 (×2): 50 mg via ORAL
  Filled 2014-05-31 (×2): qty 1

## 2014-05-31 MED ORDER — LEVETIRACETAM 750 MG PO TABS
1500.0000 mg | ORAL_TABLET | Freq: Two times a day (BID) | ORAL | Status: DC
  Administered 2014-06-01: 1500 mg via ORAL
  Filled 2014-05-31 (×3): qty 2

## 2014-05-31 MED ORDER — BOOST / RESOURCE BREEZE PO LIQD
1.0000 | Freq: Three times a day (TID) | ORAL | Status: DC
  Administered 2014-05-31: 1 via ORAL

## 2014-05-31 MED ORDER — MENTHOL 3 MG MT LOZG
1.0000 | LOZENGE | OROMUCOSAL | Status: DC | PRN
  Administered 2014-05-31: 3 mg via ORAL
  Filled 2014-05-31: qty 9

## 2014-05-31 MED ORDER — ACETAMINOPHEN 325 MG PO TABS
650.0000 mg | ORAL_TABLET | Freq: Four times a day (QID) | ORAL | Status: DC | PRN
  Administered 2014-05-31 (×2): 650 mg via ORAL
  Filled 2014-05-31 (×2): qty 2

## 2014-05-31 NOTE — Progress Notes (Signed)
Reviewed and verified orders over phone with Dr. Mcarthur Rossettianiel J. Tyson AliasFeinstein.  Will continue to monitor patient.

## 2014-05-31 NOTE — Progress Notes (Signed)
NEURO HOSPITALIST PROGRESS NOTE   SUBJECTIVE:                                                                                                                        No new neurological developments. Complains of having a sore throat. Psych evaluation requested. On vimpat 200 mg BID, keppra 1,500 mg BID, and Depacon 750 mg BID.  OBJECTIVE:                                                                                                                           Vital signs in last 24 hours: Temp:  [98 F (36.7 C)-99.8 F (37.7 C)] 98 F (36.7 C) (06/18 0700) Pulse Rate:  [73-107] 79 (06/18 0700) Resp:  [12-22] 22 (06/18 0700) BP: (96-129)/(52-79) 125/76 mmHg (06/18 0700) SpO2:  [91 %-98 %] 97 % (06/18 0700) Weight:  [82.8 kg (182 lb 8.7 oz)] 82.8 kg (182 lb 8.7 oz) (06/18 0323)  Intake/Output from previous day: 06/17 0701 - 06/18 0700 In: 906.6 [I.V.:384.1; NG/GT:30; IV Piggyback:492.5] Out: 2020 [Urine:2020] Intake/Output this shift:   Nutritional status: General  Past Medical History  Diagnosis Date  . GERD (gastroesophageal reflux disease)   . Hyperlipidemia   . History of blood transfusion   . Chronic headaches   . Phlebitis   . History of colonic polyps   . 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 Tonic clonic seizures   . Anxiety     "situational stress"    Neurologic Exam:  Mental Status:  Alert, oriented, thought content appropriate. Speech fluent without evidence of aphasia. Able to follow 3 step commands without difficulty.  Cranial Nerves:  II: Discs flat bilaterally; Visual fields grossly normal, pupils equal, round, reactive to light and accommodation  III,IV, VI: ptosis not present, extra-ocular motions intact bilaterally  V,VII: smile symmetric, facial light touch sensation normal bilaterally  VIII: hearing normal bilaterally  IX,X: gag reflex present  XI: bilateral shoulder shrug  XII: midline tongue extension  without atrophy or fasciculations  Motor:  Moves all limbs symmetrically.  Tone and bulk:normal tone throughout; no atrophy noted  Sensory: Pinprick and light touch intact throughout, bilaterally  Deep Tendon Reflexes:  Right: Upper Extremity Left: Upper  extremity  biceps (C-5 to C-6) 2/4 biceps (C-5 to C-6) 2/4  tricep (C7) 2/4 triceps (C7) 2/4  Brachioradialis (C6) 2/4 Brachioradialis (C6) 2/4  Lower Extremity Lower Extremity  quadriceps (L-2 to L-4) 2/4 quadriceps (L-2 to L-4) 2/4  Achilles (S1) 2/4 Achilles (S1) 2/4  Plantars:  Right: downgoing Left: downgoing  Cerebellar:  normal finger-to-nose, normal heel-to-shin test  Gait:  No tested   Lab Results: Lab Results  Component Value Date/Time   CHOL 234* 03/02/2013 11:15 AM   Lipid Panel No results found for this basename: CHOL, TRIG, HDL, CHOLHDL, VLDL, LDLCALC,  in the last 72 hours  Studies/Results: Dg Chest Port 1 View  05/30/2014   CLINICAL DATA:  Check endotracheal tube  EXAM: PORTABLE CHEST - 1 VIEW  COMPARISON:  05/29/2014  FINDINGS: Endotracheal tube is again noted 1.8 cm above the carina. A right jugular central line and left chest wall port are again seen and stable. Nasogastric catheter courses into the proximal esophagus and also lies at approximately the level of the aortic knob this is been significantly withdrawn when compared with the prior exam. Bibasilar atelectasis is again noted.  IMPRESSION: Stable bibasilar atelectasis.  Tubes and lines as described above. The nasogastric catheter has been withdrawn significantly from the prior exam and now lies at the level of the aortic knob in the proximal esophagus.   Electronically Signed   By: Alcide Clever M.D.   On: 05/30/2014 08:18   Dg Chest Port 1 View  05/29/2014   CLINICAL DATA:  Internal jugular catheter placement  EXAM: PORTABLE CHEST - 1 VIEW  COMPARISON:  Study obtained earlier in the day  FINDINGS: Endotracheal tube tip is 2.6 cm above the carina. Right  jugular catheter tip is in the superior vena cava near the cavoatrial junction. Port-A-Cath tip is at the cavoatrial junction. Nasogastric tube tip and side port are below the diaphragm. No pneumothorax. There is patchy bibasilar atelectatic change. Lungs are otherwise clear. Heart size and pulmonary vascularity are normal. No adenopathy.  IMPRESSION: Tube and catheter positions as described without pneumothorax. Lower lobe atelectatic change bilaterally.   Electronically Signed   By: Bretta Bang M.D.   On: 05/29/2014 14:37   Dg Abd Portable 1v  05/29/2014   CLINICAL DATA:  Nasogastric tube position.  EXAM: PORTABLE ABDOMEN - 1 VIEW  COMPARISON:  None.  FINDINGS: Nasogastric tube tip is seen in proximal stomach. Stool is seen throughout the colon concerning for constipation. No definite small bowel dilatation is noted. Status post surgical stabilization of lower lumbar spine. Phleboliths are noted in the pelvis.  IMPRESSION: Nasogastric tube tip is seen in proximal stomach. Stool seen throughout the colon concerning for constipation.   Electronically Signed   By: Roque Lias M.D.   On: 05/29/2014 12:32    MEDICATIONS  Scheduled: . antiseptic oral rinse  15 mL Mouth Rinse QID  . chlorhexidine  15 mL Mouth Rinse BID  . famotidine (PEPCID) IV  20 mg Intravenous Q12H  . feeding supplement (RESOURCE BREEZE)  1 Container Oral TID BM  . heparin  5,000 Units Subcutaneous 3 times per day  . lacosamide (VIMPAT) IV  100 mg Intravenous Q12H  . levETIRAcetam  1,500 mg Intravenous Q12H  . valproate sodium  750 mg Intravenous Q12H    ASSESSMENT/PLAN:                                                                                                           53 yo F with recurrent spells of right arm and leg stiffening despite triple anticonvulsant regimen. She sustained multiple typical  events recorded on c-EEG with video but there was not EEG correlate. Furthermore, interictal EEG showed no epileptiform discharges but slowing and disorganization suggestive of mild encephalopathy and most likely related to sedation.  ? 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 Will suggest maintaining current AED regimen as patient's seizure semiology seems to be very compatible with a genuine right partial onset seizure despite the fact that there is not concomitant EEG correlate, which is not unusual in focal motor seizures. She will need outpatient neurology follow up and eventual admission to an epilepsy unit for inpatient V-EEG monitoring in order to better characterize her seizures.  Neurology will sign off.  Wyatt Portelasvaldo Camilo, MD [facility] [phone]  05/31/2014, 11:11 AM

## 2014-05-31 NOTE — Progress Notes (Signed)
Transferred patient to 4N11 via wheelchair with possessions.  Receiving RN in room, patient left in bed with call light. DUPONT, CONSTANCE C

## 2014-05-31 NOTE — Progress Notes (Addendum)
Called report to 4N.  RN will call back in a few minutes. DUPONT, CONSTANCE C Report given at 1215.  Will transport to 4N11 after Central line removal and PIV insertion. DUPONT, CONSTANCE C

## 2014-05-31 NOTE — Progress Notes (Signed)
NUTRITION FOLLOW-UP  INTERVENTION: Resource Breeze po TID, each supplement provides 250 kcal and 9 grams of protein  NUTRITION DIAGNOSIS: Inadequate oral intake related to sore throat and decreased appetite as evidenced by Meal Completion: <25%.  Goal: Pt to meet >/= 90% of their estimated nutrition needs; not met.    Monitor:  PO intake, supplement acceptance, weight trend, labs  ASSESSMENT: 80 F with hx of suspected seizure disorder admitted with recurrent seizure or seizure like episodes eventually requiring intubation.  Pt extubated 6/17 Pt reports poor appetite due to sore throat. Pt had not touched her Breakfast. Pt does not like ensure but is willing to try Resource Breeze.   Height: Ht Readings from Last 1 Encounters:  03/16/14 5' 10.5" (1.791 m)    Weight: Wt Readings from Last 1 Encounters:  05/31/14 182 lb 8.7 oz (82.8 kg)  Usual body weight 189 lb (85.7 kg)   BMI:  Body mass index is 25.81 kg/(m^2).  Estimated Nutritional Needs: Kcal: 1700-1900 Protein: 80-90 grams Fluid: >1.7 L/day  Skin: no issues noted  Diet Order: General Meal Completion: 0%   Intake/Output Summary (Last 24 hours) at 05/31/14 0921 Last data filed at 05/31/14 0700  Gross per 24 hour  Intake    735 ml  Output   1445 ml  Net   -710 ml    Last BM: PTA   Labs:   Recent Labs Lab 05/28/14 1824 05/29/14 0530 05/30/14 0500 05/31/14 0525  NA  --  144 145 144  K  --  3.9 3.9 4.1  CL  --  108 107 106  CO2  --  24 24 23   BUN  --  9 6 9   CREATININE  --  0.73 0.65 0.71  CALCIUM  --  8.4 9.2 9.2  MG 1.8  --   --   --   PHOS 2.9  --   --   --   GLUCOSE  --  121* 113* 88    CBG (last 3)   Recent Labs  05/30/14 2347 05/31/14 0320 05/31/14 0748  GLUCAP 103* 90 94    Scheduled Meds: . antiseptic oral rinse  15 mL Mouth Rinse QID  . chlorhexidine  15 mL Mouth Rinse BID  . famotidine (PEPCID) IV  20 mg Intravenous Q12H  . heparin  5,000 Units Subcutaneous 3 times per day   . lacosamide (VIMPAT) IV  100 mg Intravenous Q12H  . levETIRAcetam  1,500 mg Intravenous Q12H  . valproate sodium  750 mg Intravenous Q12H    Continuous Infusions: . sodium chloride 10 mL/hr at 05/30/14 2145  . midazolam (VERSED) infusion Stopped (05/28/14 1947)  . phenylephrine ([name]-SYNEPHRINE) Adult infusion Stopped (05/30/14 0930)  . propofol Stopped (05/30/14 0830)    [place], [place], CNSC [phone] Pager [phone] After Hours Pager

## 2014-05-31 NOTE — Progress Notes (Signed)
MD paged to verify orders for patient.  Will continue to monitor patient.

## 2014-05-31 NOTE — Progress Notes (Signed)
Physical Therapy Treatment Patient Details Name: Brenda MannanWanda Bernardy MRN: [id number] DOB: 1961/08/21 Today's Date: 05/31/2014    History of Present Illness [age] F with hx of suspected seizure disorder presented to [facility] ED with recurrent seizure or seizure-like episodes, eventually requiring intubation and [facility] to [facility] ICU, extubated 6/16.    PT Comments    Patient tolerated ambulation well, improved stability with use of RW. Patient appears more clear with cognition today compared to prior session. Will continue to progress activity as tolerated.  Follow Up Recommendations  Home health PT;Supervision/Assistance - 24 hour     Equipment Recommendations  Rolling walker with 5" wheels    Recommendations for Other Services       Precautions / Restrictions Precautions Precautions: Fall    Mobility  Bed Mobility Overal bed mobility: Needs Assistance Bed Mobility: Supine to Sit;Sit to Supine     Supine to sit: Supervision Sit to supine: Supervision   General bed mobility comments: increased time, no physical assist needed  Transfers Overall transfer level: Needs assistance Equipment used: Rolling walker (2 wheeled) Transfers: Sit to/from Stand Sit to Stand: Min guard         General transfer comment: Min guard for stability, no physical assist needed  Ambulation/Gait Ambulation/Gait assistance: Supervision;Min guard Ambulation Distance (Feet): 160 Feet Assistive device: Rolling walker (2 wheeled) Gait Pattern/deviations: Step-through pattern;Decreased stride length;Shuffle;Narrow base of support;Scissoring Gait velocity: decreased Gait velocity interpretation: Below normal speed for age/gender General Gait Details: patient with improved stability using RW today, gait speed stil significantly decreased despite VCs to increase cadence   Stairs            Wheelchair Mobility    Modified Rankin (Stroke Patients Only)       Balance    Sitting-balance support: Feet supported Sitting balance-Leahy Scale: Good (able to don socks with good dynamic balance)     Standing balance support: Bilateral upper extremity supported;During functional activity Standing balance-Leahy Scale: Fair                      Cognition Arousal/Alertness: Awake/alert Behavior During Therapy: Flat affect Overall Cognitive Status: Within Functional Limits for tasks assessed                 General Comments: improved cognition today    Exercises      General Comments General comments (skin integrity, edema, etc.): educated patient on safety and mobility expectations      Pertinent Vitals/Pain Painful throat, otherwise NAD    Home Living                      Prior Function            PT Goals (current goals can now be found in the care plan section) Acute Rehab PT Goals Patient Stated Goal: to go home PT Goal Formulation: With patient Time For Goal Achievement: 06/13/14 Potential to Achieve Goals: Good Progress towards PT goals: Progressing toward goals    Frequency  Min 4X/week    PT Plan Current plan remains appropriate    Co-evaluation             End of Session Equipment Utilized During Treatment: Gait belt Activity Tolerance: Patient limited by fatigue Patient left: in chair;with call bell/phone within reach     Time: 7829-56210749-0813 PT Time Calculation (min): 24 min  Charges:  $Gait Training: 8-22 mins $Therapeutic Activity: 8-22 mins  G CodesFabio Asa:      Chane Cowden J 05/31/2014, 8:17 AM Charlotte Crumbevon Jairus Tonne, PT DPT  [phone]

## 2014-05-31 NOTE — Progress Notes (Addendum)
PULMONARY / CRITICAL CARE MEDICINE   Name: Brenda Mcguire MRN: [id number] DOB: 06-18-1961    ADMISSION DATE:  05/28/2014  PRIMARY SERVICE: PCCM  BRIEF PATIENT DESCRIPTION:  [age] F with hx of suspected seizure disorder presented to [facility] ED with recurrent seizure or seizure-like episodes, eventually requiring intubation and [facility].    SIGNIFICANT EVENTS / STUDIES:  6/15 - Intubated 6/15 - CT head: No definite acute intracranial abnormalities 6/15 - c-EEG w/ video>>>6/17, multiple typical events on video w/o EEG correlation, no epileptiform discharges.  Slowing and disorganization suggestive of mild encephalopathy likely related to sedation. 6/17 - Extubated  LINES / TUBES: L [place] Port a cath (longstanding) ETT 6/15 >>>6/17 L IJ CVL 6/16>>>  CULTURES:  ANTIBIOTICS:  SUBJECTIVE: No major overnight events per nurse.  Patient is up in a chair at bedside.  She was extubated yesterday without complication.  Her only complaint at this time is a sore throat, likely from ETT.  Expresses wanting to go home.  Denies any headache, difficulty breathing, shortness of breath or chest pain/palpitations. Admits non-productive cough and feeling weak.  VITAL SIGNS: Temp:  [98 F (36.7 C)-99.8 F (37.7 C)] 98 F (36.7 C) (06/18 0700) Pulse Rate:  [73-146] 79 (06/18 0700) Resp:  [12-23] 22 (06/18 0700) BP: (96-129)/(52-81) 125/76 mmHg (06/18 0700) SpO2:  [88 %-100 %] 97 % (06/18 0700) Weight:  [82.8 kg (182 lb 8.7 oz)] 82.8 kg (182 lb 8.7 oz) (06/18 0323)  HEMODYNAMICS:   VENTILATOR SETTINGS:   INTAKE / OUTPUT: Intake/Output     06/17 0701 - 06/18 0700 06/18 0701 - 06/19 0700   I.V. (mL/kg) 384.1 (4.6)    NG/GT 30    IV Piggyback 492.5    Total Intake(mL/kg) 906.6 (10.9)    Urine (mL/kg/hr) 2020 (1)    Total Output 2020     Net -1113.4           PHYSICAL EXAMINATION: General:  White woman, in a chair at bedside eating ice chips. NAD. Neuro:  RASS 0,  awake, alert and oriented. Answering questions appropriately.  No speech abnormalities.  Generalized weakness. HEENT:  PERRL, oral mucosa pink and moist. Oropharynx with some mild erythema, likely post-ETT. Cardiovascular:  RRR, no peripheral edema noted, no cyanosis or clubbing.  Lungs:  CTAB, resp even and unlabored.  Abdomen: Soft, NT, non-distended. BS + Ext: Warm, no edema noted. No deformities.  LABS:  CBC  Recent Labs Lab 05/28/14 1824 05/30/14 0500 05/31/14 0525  WBC 6.7 9.4 6.2  HGB 10.5* 10.9* 10.1*  HCT 31.5* 32.9* 31.3*  PLT 230 263 233   Coag's No results found for this basename: APTT, INR,  in the last 168 hours BMET  Recent Labs Lab 05/29/14 0530 05/30/14 0500 05/31/14 0525  NA 144 145 144  K 3.9 3.9 4.1  CL 108 107 106  CO2 24 24 23   BUN 9 6 9   CREATININE 0.73 0.65 0.71  GLUCOSE 121* 113* 88   Electrolytes  Recent Labs Lab 05/28/14 1824 05/29/14 0530 05/30/14 0500 05/31/14 0525  CALCIUM  --  8.4 9.2 9.2  MG 1.8  --   --   --   PHOS 2.9  --   --   --    Sepsis Markers No results found for this basename: LATICACIDVEN, PROCALCITON, O2SATVEN,  in the last 168 hours ABG No results found for this basename: PHART, PCO2ART, PO2ART,  in the last 168 hours Liver Enzymes  Recent Labs Lab  05/29/14 0530 05/30/14 0500  AST 14 13  ALT 11 11  ALKPHOS 77 81  BILITOT <0.2* 0.3  ALBUMIN 2.9* 3.4*   Cardiac Enzymes No results found for this basename: TROPONINI, PROBNP,  in the last 168 hours Glucose  Recent Labs Lab 05/30/14 0729 05/30/14 1211 05/30/14 1558 05/30/14 2017 05/30/14 2347 05/31/14 0320  GLUCAP 132* 112* 120* 105* 103* 90   CXR: WNL, bibasilar ATX  ASSESSMENT / PLAN:  PULMONARY A:  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 P:  - Room air, sats > 95% w/o supp O2 - Continue to assess cough, resp strength - Cepacol or similar for throat irritation post-extubation  CARDIOVASCULAR A:  Transient hypotension related to sedation -  resolved, no evidence ischemia P:  - [name] D/C'ed as no need after stopping propofol -Tele dc - D/C CVP monitoring  RENAL A:   Status neg balance on own P:   - Rpt BMet - NS KVO  GASTROINTESTINAL A:   No issues P:   - SUP: IV famotidine - Restart diet as tolerated  HEMATOLOGIC A:   No issues P:  - DVT px: SQ heparin - Rpt CBC in am  INFECTIOUS A:   No issues P:   - Micro and abx   ENDOCRINE A:   No issues P:   - Monitor glu on chem panels - Initiate SSI for glu > 180  NEUROLOGIC A:   Status epilepticus?? psuedo? P:   - PRN fentanyl - RASS goal 0  - EEG complete yesterday - Continue Vimpat, Keppra, Depacon per neuro -psych called  Dc line if we get access  Elenore PaddyStephanie M. Reese, PA-S  I have fully examined this patient and agree with above findings.    And edited in full  Mcarthur RossettiDaniel J. Tyson AliasFeinstein, MD, FACP Pgr: [phone] [facility]   To [facility], to floor

## 2014-06-01 DIAGNOSIS — F3289 Other specified depressive episodes: Secondary | ICD-10-CM

## 2014-06-01 DIAGNOSIS — F329 Major depressive disorder, single episode, unspecified: Secondary | ICD-10-CM

## 2014-06-01 DIAGNOSIS — F449 Dissociative and conversion disorder, unspecified: Secondary | ICD-10-CM

## 2014-06-01 LAB — GLUCOSE, CAPILLARY
GLUCOSE-CAPILLARY: 77 mg/dL (ref 70–99)
Glucose-Capillary: 74 mg/dL (ref 70–99)
Glucose-Capillary: 93 mg/dL (ref 70–99)
Glucose-Capillary: 99 mg/dL (ref 70–99)

## 2014-06-01 MED ORDER — LEVETIRACETAM 750 MG PO TABS: 1500.0000 mg | ORAL_TABLET | Freq: Two times a day (BID) | ORAL | Status: DC

## 2014-06-01 MED ORDER — LACOSAMIDE 100 MG PO TABS: 100.0000 mg | ORAL_TABLET | Freq: Two times a day (BID) | ORAL | Status: DC

## 2014-06-01 MED ORDER — HEPARIN SOD (PORK) LOCK FLUSH 100 UNIT/ML IV SOLN
500.0000 [IU] | INTRAVENOUS | Status: AC | PRN
  Administered 2014-06-01: 500 [IU]

## 2014-06-01 MED ORDER — LACOSAMIDE 200 MG/20ML IV SOLN: 100.0000 mg | Freq: Two times a day (BID) | INTRAVENOUS | Status: DC

## 2014-06-01 MED ORDER — DIVALPROEX SODIUM 250 MG PO DR TAB: 750.0000 mg | DELAYED_RELEASE_TABLET | Freq: Two times a day (BID) | ORAL | Status: DC

## 2014-06-01 NOTE — Care Management Note (Signed)
    Page 1 of 1   06/01/2014     10:52:47 AM CARE MANAGEMENT NOTE 06/01/2014  Patient:  Brenda Mcguire, Brenda Mcguire   Account Number:  [id number]  Date Initiated:  06/01/2014  Documentation initiated by:  Lorne Skeens  Subjective/Objective Assessment:   Patient admitted with seizures, resp failure. Lives at home with spouse.     Action/Plan:   Will follow for discharge needs pending PT/OT evals and physician orders   Anticipated DC Date:  06/01/2014   Anticipated DC Plan:  [place]  CM consult      Choice offered to / List presented to:  C-1 Patient        [place] arranged  [facility]      [place].   Status of service:  Completed, signed off Medicare Important Message given?   (If response is "NO", the following Medicare IM given date fields will be blank) Date Medicare IM given:   Date Additional Medicare IM given:    Discharge Disposition:    Per UR Regulation:  Reviewed for med. necessity/level of care/duration of stay  If discussed at [place] of Stay Meetings, dates discussed:    Comments:  06/01/14 [place], MSN, CM- Met with patient to discuss home health needs. Patient is agreeable and has chosen [facility] for HHPT, which she has used in the past.  Stanton Kidney with [facility] was notified and has accepted the referral for probable discharge home today.  Patient's contact information and address are correct in the chart, with her cell phone being the preferred contact phone number for appointments.

## 2014-06-01 NOTE — Discharge Summary (Signed)
Physician Discharge Summary  Brenda MannanWanda Mcguire WJX:[id number] DOB: Apr 25, 1961 DOA: 05/28/2014  PCP: Nicki ReaperBAITY, REGINA, NP (at [facility])  Admit date: 05/28/2014 Discharge date: 06/01/2014  Time spent: >45 minutes  Recommendations for Outpatient Follow-up:  1. Neuro f/u 2. Home with HHPT  Discharge Diagnoses:  Principal Problem:   Status epilepticus Active Problems:   Behcet's disease   GERD (gastroesophageal reflux disease)   Acute respiratory failure   Discharge Condition: stable  Diet recommendation: heart healthy  Filed Weights   05/29/14 0500 05/30/14 0400 05/31/14 0323  Weight: 88 kg (194 lb 0.1 oz) 89 kg (196 lb 3.4 oz) 82.8 kg (182 lb 8.7 oz)   Past Medical History  Diagnosis Date  . GERD (gastroesophageal reflux disease)   . Hyperlipidemia   . History of blood transfusion   . Chronic headaches   . Phlebitis   . History of colonic polyps   . 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 Tonic clonic seizures   . Anxiety     "situational stress"   History of present illness:  [age] F with hx of suspected seizure disorder presented [facility] with recurrent seizure or seizure like episodes eventually requiring intubation and transfer to [facility] ICU. She was transferred out of the ICU yesterday and to the hospitalist service today.  Hospital Course:  6/15 - Intubated  6/15 - CT head: No definite acute intracranial abnormalities  6/15 - c-EEG w/ video>>>6/17, multiple typical events on video w/o EEG correlation, no epileptiform discharges. Slowing and disorganization suggestive of mild encephalopathy likely related to sedation.  6/17 - Extubated  Seizures: - has a h/o of this in the past and was under treatment - Events mentioned above. A Neuro consult was requested and pt was seen by Dr Anise Salvokirpatrick and then Dr Leroy Kennedyamilo.  - Per Dr Delight Ovensamilo's last note yesterday, he feels that patient's seizures are "compatible with right partial onset seizure despite  the fact that there is not concomitant EEG correlate, which is not unusual in focal motor seizures." - he recommends continuing the current AEDs and outpt neuro f/u for admission to an epilepsy unit and inpatient V-EEG monitoring - she sees [facility]Willis [facility]- last seen in 04/2013- I have called his office and tried to talk to him -Current receiving vimpat 200 mg BID (new medication) , keppra 1,500 mg BID (home dose 1000 BID) , and Depacon 750 mg BID (home dose 500 in AM and afternoon and 750 in PM) - will give her new prescriptions for all of the above - she has been evaluated by PT and will receive HHPT  Behcet's disease - on Plaquenil   GERD and hiatal hernia - on PPI for this  Consultations:  Neuro  Discharge Exam: Filed Vitals:   06/01/14 0600  BP: 108/64  Pulse: 78  Temp: 98.1 F (36.7 C)  Resp: 18    General: middle aged female sitting up in bed combing her hair. No acute distress Cardiovascular: RRR, no murmurs Respiratory: CTA b/l  Abd: soft, NT, ND BS+  Discharge Instructions You were cared for by a hospitalist during your hospital stay. If you have any questions about your discharge medications or the care you received while you were in the hospital after you are discharged, you can call the unit and asked to speak with the hospitalist on call if the hospitalist that took care of you is not available. Once you are discharged, your primary care physician will  handle any further medical issues. Please note that NO REFILLS for any discharge medications will be authorized once you are discharged, as it is imperative that you return to your primary care physician (or establish a relationship with a primary care physician if you do not have one) for your aftercare needs so that they can reassess your need for medications and monitor your lab values.      Discharge Instructions   Diet - low sodium heart healthy    Complete by:  As directed      Increase  activity slowly    Complete by:  As directed             Medication List         ALPRAZolam 0.5 MG tablet  Commonly known as:  XANAX  Take 0.5 mg by mouth 3 (three) times daily as needed for anxiety.     amitriptyline 25 MG tablet  Commonly known as:  ELAVIL  Take 25 mg by mouth at bedtime.     divalproex 250 MG DR tablet  Commonly known as:  DEPAKOTE  Take 3 tablets (750 mg total) by mouth 2 (two) times daily.     gabapentin 300 MG capsule  Commonly known as:  NEURONTIN  Take 600 mg by mouth 3 (three) times daily.     HYDROcodone-acetaminophen 5-325 MG per tablet  Commonly known as:  NORCO/VICODIN  Take 1 tablet by mouth every 12 (twelve) hours.     hydroxychloroquine 200 MG tablet  Commonly known as:  PLAQUENIL  Take 1 tablet (200 mg total) by mouth 2 (two) times daily.     lacosamide 100 mg in sodium chloride 0.9 % 25 mL  Inject 100 mg into the vein every 12 (twelve) hours.     levETIRAcetam 750 MG tablet  Commonly known as:  KEPPRA  Take 2 tablets (1,500 mg total) by mouth 2 (two) times daily.     lidocaine 2 % solution  Commonly known as:  XYLOCAINE  Use as directed 20 mLs in the mouth or throat as needed for mouth pain.     promethazine 25 MG suppository  Commonly known as:  PHENERGAN  Place 25 mg rectally every 8 (eight) hours as needed for nausea.       Allergies  Allergen Reactions  . Iohexol Anaphylaxis, Hives and Shortness Of Breath  . Hepatitis B Vaccine Hives    wheezing  . 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  Chest pain  . Codeine Itching and Rash      The results of significant diagnostics from this hospitalization (including imaging, microbiology, ancillary and laboratory) are listed below for reference.    Significant Diagnostic Studies: Dg Chest Port 1 View  05/30/2014   CLINICAL DATA:  Check endotracheal tube  EXAM: PORTABLE CHEST - 1 VIEW  COMPARISON:  05/29/2014  FINDINGS: Endotracheal tube is again noted 1.8 cm above  the carina. A right jugular central line and left chest wall port are again seen and stable. Nasogastric catheter courses into the proximal esophagus and also lies at approximately the level of the aortic knob this is been significantly withdrawn when compared with the prior exam. Bibasilar atelectasis is again noted.  IMPRESSION: Stable bibasilar atelectasis.  Tubes and lines as described above. The nasogastric catheter has been withdrawn significantly from the prior exam and now lies at the level of the aortic knob in the proximal esophagus.   Electronically Signed   By: Loraine LericheMark  Lukens M.D.   On: 05/30/2014 08:18   Dg Chest Port 1 View  05/29/2014   CLINICAL DATA:  Internal jugular catheter placement  EXAM: PORTABLE CHEST - 1 VIEW  COMPARISON:  Study obtained earlier in the day  FINDINGS: Endotracheal tube tip is 2.6 cm above the carina. Right jugular catheter tip is in the superior vena cava near the cavoatrial junction. Port-A-Cath tip is at the cavoatrial junction. Nasogastric tube tip and side port are below the diaphragm. No pneumothorax. There is patchy bibasilar atelectatic change. Lungs are otherwise clear. Heart size and pulmonary vascularity are normal. No adenopathy.  IMPRESSION: Tube and catheter positions as described without pneumothorax. Lower lobe atelectatic change bilaterally.   Electronically Signed   By: Bretta Bang M.D.   On: 05/29/2014 14:37   Dg Chest Port 1 View  05/29/2014   CLINICAL DATA:  Respiratory failure, followup  EXAM: PORTABLE CHEST - 1 VIEW  COMPARISON:  Portable exam 0606 hr compared to 05/28/2014  FINDINGS: Tip of endotracheal tube projects 1.7 cm above carina.  Nasogastric tube extends into stomach.  LEFT subclavian Port-A-Cath with tip projecting over cavoatrial junction.  Normal heart size, mediastinal contours, and pulmonary vascularity.  Low lung volumes with bibasilar atelectasis.  Lungs otherwise clear.  No gross pleural effusion or pneumothorax.  IMPRESSION:  Low lung volumes with bibasilar atelectasis.   Electronically Signed   By: Ulyses Southward M.D.   On: 05/29/2014 08:09   Dg Abd Portable 1v  05/29/2014   CLINICAL DATA:  Nasogastric tube position.  EXAM: PORTABLE ABDOMEN - 1 VIEW  COMPARISON:  None.  FINDINGS: Nasogastric tube tip is seen in proximal stomach. Stool is seen throughout the colon concerning for constipation. No definite small bowel dilatation is noted. Status post surgical stabilization of lower lumbar spine. Phleboliths are noted in the pelvis.  IMPRESSION: Nasogastric tube tip is seen in proximal stomach. Stool seen throughout the colon concerning for constipation.   Electronically Signed   By: Roque Lias M.D.   On: 05/29/2014 12:32    Microbiology: Recent Results (from the past 240 hour(s))  MRSA PCR SCREENING     Status: None   Collection Time    05/28/14  6:45 PM      Result Value Ref Range Status   MRSA by PCR NEGATIVE  NEGATIVE Final   Comment:            The GeneXpert MRSA Assay (FDA     approved for NASAL specimens     only), is one component of a     comprehensive MRSA colonization     surveillance program. It is not     intended to diagnose MRSA     infection nor to guide or     monitor treatment for     MRSA infections.     Labs: Basic Metabolic Panel:  Recent Labs Lab 05/28/14 1824 05/29/14 0530 05/30/14 0500 05/31/14 0525  NA  --  144 145 144  K  --  3.9 3.9 4.1  CL  --  108 107 106  CO2  --  24 24 23   GLUCOSE  --  121* 113* 88  BUN  --  9 6 9   CREATININE  --  0.73 0.65 0.71  CALCIUM  --  8.4 9.2 9.2  MG 1.8  --   --   --   PHOS 2.9  --   --   --    Liver Function Tests:  Recent Labs Lab  05/29/14 0530 05/30/14 0500  AST 14 13  ALT 11 11  ALKPHOS 77 81  BILITOT <0.2* 0.3  PROT 5.7* 6.9  ALBUMIN 2.9* 3.4*   No results found for this basename: LIPASE, AMYLASE,  in the last 168 hours No results found for this basename: AMMONIA,  in the last 168 hours CBC:  Recent Labs Lab  05/28/14 1824 05/30/14 0500 05/31/14 0525  WBC 6.7 9.4 6.2  NEUTROABS  --  6.3  --   HGB 10.5* 10.9* 10.1*  HCT 31.5* 32.9* 31.3*  MCV 91.8 92.2 92.1  PLT 230 263 233   Cardiac Enzymes: No results found for this basename: CKTOTAL, CKMB, CKMBINDEX, TROPONINI,  in the last 168 hours BNP: BNP (last 3 results) No results found for this basename: PROBNP,  in the last 8760 hours CBG:  Recent Labs Lab 05/31/14 1221 05/31/14 2023 06/01/14 0012 06/01/14 0359 06/01/14 0812  GLUCAP 94 85 74 77 99       Signed:  RIZWAN,SAIMA, MD [facility] 06/01/2014, 8:33 AM

## 2014-06-01 NOTE — Progress Notes (Signed)
Discharge instruction, medication script and follow up instruction given to pt and spouse. Pt and spouse verbalized good understanding IV port de assessed by IV team . Pt condition at discharge is stable.

## 2014-06-01 NOTE — Progress Notes (Signed)
Physical Therapy Treatment Patient Details Name: Brenda MannanWanda Mcguire MRN: [id number] DOB: 07-Mar-1961 Today's Date: 06/01/2014    History of Present Illness [age] F with hx of suspected seizure disorder presented to [facility] ED with recurrent seizure or seizure-like episodes, eventually requiring intubation and transfer to [facility] ICU, extubated 6/16.    PT Comments    Patient progressing well. Eager to DC home today. Able to complete stair training.   Follow Up Recommendations  Home health PT;Supervision/Assistance - 24 hour     Equipment Recommendations  Rolling walker with 5" wheels    Recommendations for Other Services       Precautions / Restrictions Precautions Precautions: Fall    Mobility  Bed Mobility Overal bed mobility: Modified Independent                Transfers Overall transfer level: Modified independent                  Ambulation/Gait Ambulation/Gait assistance: Supervision Ambulation Distance (Feet): 250 Feet Assistive device: None Gait Pattern/deviations: Step-through pattern Gait velocity: decreased   General Gait Details: Patient wanted to attempt without RW. Guarded but no LOB.    Stairs Stairs: Yes Stairs assistance: Min guard Stair Management: Alternating pattern;Forwards;One rail Right Number of Stairs: 12 General stair comments: Patient able to complete a flight of steps with no LOB. MinGuard for safety  Wheelchair Mobility    Modified Rankin (Stroke Patients Only)       Balance                                    Cognition Arousal/Alertness: Awake/alert Behavior During Therapy: WFL for tasks assessed/performed Overall Cognitive Status: Within Functional Limits for tasks assessed                      Exercises      General Comments        Pertinent Vitals/Pain Denied pain    Home Living                      Prior Function            PT Goals (current goals  can now be found in the care plan section) Progress towards PT goals: Progressing toward goals    Frequency  Min 4X/week    PT Plan Current plan remains appropriate    Co-evaluation             End of Session Equipment Utilized During Treatment: Gait belt Activity Tolerance: Patient tolerated treatment well Patient left: in bed;with call bell/phone within reach     Time: 1425-1440 PT Time Calculation (min): 15 min  Charges:  $Gait Training: 8-22 mins                    G Codes:      Fredrich BirksRobinette, Julia Elizabeth 06/01/2014, 2:56 PM  06/01/2014 Fredrich Birksobinette, Julia Elizabeth PTA [phone] pager [phone] office

## 2014-06-01 NOTE — Consult Note (Signed)
[facility] Face-to-Face Psychiatry Consult   Reason for Consult:  pseudoseizure Referring Physician:  Dr. Izola Price is an 53 y.o. female. Total Time spent with patient: 45 minutes  Assessment: AXIS I:  Depressive Disorder NOS and Conversion disorder AXIS II:  Deferred AXIS III:   Past Medical History  Diagnosis Date  . GERD (gastroesophageal reflux disease)   . Hyperlipidemia   . History of blood transfusion   . Chronic headaches   . Phlebitis   . History of colonic polyps   . 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 Tonic clonic seizures   . Anxiety     "situational stress"   AXIS IV:  other psychosocial or environmental problems, problems related to social environment and problems with primary support group AXIS V:  41-50 serious symptoms  Plan:  No evidence of imminent risk to self or others at present.   Patient does not meet criteria for psychiatric inpatient admission. Supportive therapy provided about ongoing stressors. Appreciate psychiatric consultation Please contact [phone] if needs further assistance  Subjective:   Brenda Mcguire is a 53 y.o. female patient admitted with seizure.  HPI: Patient is seen and chart reviewed. Psychiatry consultation requested for pseudoseizures. 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 His has been living with her husband and has 2 children. Patient reportedly has history of previous seizure disorder and has been taking antiseizure medication. Patient stated she has no known psychosocial stressors, denied symptoms of depression, anxiety, psychosis, suicide and homicidal ideation intentions or plans. Patient reported she does not remember the episode of seizure happened when she was admitted. Patient has no history of substance abuse or victimization.  Medically history: 57 F with hx of suspected seizure disorder presented to [facility] ED with recurrent seizure or seizure  like episodes eventually requiring intubation and transfer to [facility] ICU  HPI Elements:  Location:  Depression and status post seizure. Quality:  Moderate. Severity:  Acute. Timing:  Status post seizure episode.  Past Psychiatric History: Past Medical History  Diagnosis Date  . GERD (gastroesophageal reflux disease)   . Hyperlipidemia   . History of blood transfusion   . Chronic headaches   . Phlebitis   . History of colonic polyps   . 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 Tonic clonic seizures   . Anxiety     "situational stress"    reports that she has never smoked. She has never used smokeless tobacco. She reports that she does not drink alcohol or use illicit drugs. Family History  Problem Relation Age of Onset  . Diabetes Mother   . Hyperlipidemia Mother   . Stroke Mother   . Hypertension Mother   . Colon cancer Mother   . Breast cancer Mother   . Early death Father   . Heart attack Father   . Heart disease Father   . Hyperlipidemia Father   . COPD Sister      Living Arrangements: Spouse/significant other   Abuse/Neglect [facility]) Physical Abuse: Denies Verbal Abuse: Denies Sexual Abuse: Denies Allergies:   Allergies  Allergen Reactions  . Iohexol Anaphylaxis, Hives and Shortness Of Breath  . Hepatitis B Vaccine Hives    wheezing  . 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  Chest pain  . Codeine Itching and Rash    ACT Assessment Complete:  No Objective: Blood pressure 121/77, pulse 77, temperature 98.8  F (37.1 C), temperature source Oral, resp. rate 20, weight 82.8 kg (182 lb 8.7 oz), SpO2 96.00%.Body mass index is 25.81 kg/(m^2). Results for orders placed during the hospital encounter of 05/28/14 (from the past 72 hour(s))  GLUCOSE, CAPILLARY     Status: None   Collection Time    05/29/14  1:33 PM      Result Value Ref Range   Glucose-Capillary 87  70 - 99 mg/dL  GLUCOSE, CAPILLARY     Status: Abnormal   Collection Time    05/29/14  3:38 PM       Result Value Ref Range   Glucose-Capillary 102 (*) 70 - 99 mg/dL  GLUCOSE, CAPILLARY     Status: Abnormal   Collection Time    05/29/14  7:48 PM      Result Value Ref Range   Glucose-Capillary 121 (*) 70 - 99 mg/dL   Comment 1 Notify RN     Comment 2 Documented in Chart    GLUCOSE, CAPILLARY     Status: Abnormal   Collection Time    05/30/14 12:12 AM      Result Value Ref Range   Glucose-Capillary 126 (*) 70 - 99 mg/dL  GLUCOSE, CAPILLARY     Status: Abnormal   Collection Time    05/30/14  3:44 AM      Result Value Ref Range   Glucose-Capillary 125 (*) 70 - 99 mg/dL  COMPREHENSIVE METABOLIC PANEL     Status: Abnormal   Collection Time    05/30/14  5:00 AM      Result Value Ref Range   Sodium 145  137 - 147 mEq/L   Potassium 3.9  3.7 - 5.3 mEq/L   Chloride 107  96 - 112 mEq/L   CO2 24  19 - 32 mEq/L   Glucose, Bld 113 (*) 70 - 99 mg/dL   BUN 6  6 - 23 mg/dL   Creatinine, Ser 0.65  0.50 - 1.10 mg/dL   Calcium 9.2  8.4 - 10.5 mg/dL   Total Protein 6.9  6.0 - 8.3 g/dL   Albumin 3.4 (*) 3.5 - 5.2 g/dL   AST 13  0 - 37 U/L   ALT 11  0 - 35 U/L   Alkaline Phosphatase 81  39 - 117 U/L   Total Bilirubin 0.3  0.3 - 1.2 mg/dL   GFR calc non Af Amer >90  >90 mL/min   GFR calc Af Amer >90  >90 mL/min   Comment: (NOTE)     The eGFR has been calculated using the CKD EPI equation.     This calculation has not been validated in all clinical situations.     eGFR's persistently <90 mL/min signify possible Chronic Kidney     Disease.  CBC WITH DIFFERENTIAL     Status: Abnormal   Collection Time    05/30/14  5:00 AM      Result Value Ref Range   WBC 9.4  4.0 - 10.5 K/uL   RBC 3.57 (*) 3.87 - 5.11 MIL/uL   Hemoglobin 10.9 (*) 12.0 - 15.0 g/dL   HCT 32.9 (*) 36.0 - 46.0 %   MCV 92.2  78.0 - 100.0 fL   MCH 30.5  26.0 - 34.0 pg   MCHC 33.1  30.0 - 36.0 g/dL   RDW 13.1  11.5 - 15.5 %   Platelets 263  150 - 400 K/uL   Neutrophils Relative % 67  43 - 77 %  Neutro Abs 6.3  1.7 -  7.7 K/uL   Lymphocytes Relative 20  12 - 46 %   Lymphs Abs 1.8  0.7 - 4.0 K/uL   Monocytes Relative 9  3 - 12 %   Monocytes Absolute 0.8  0.1 - 1.0 K/uL   Eosinophils Relative 4  0 - 5 %   Eosinophils Absolute 0.4  0.0 - 0.7 K/uL   Basophils Relative 0  0 - 1 %   Basophils Absolute 0.0  0.0 - 0.1 K/uL  GLUCOSE, CAPILLARY     Status: Abnormal   Collection Time    05/30/14  7:29 AM      Result Value Ref Range   Glucose-Capillary 132 (*) 70 - 99 mg/dL  GLUCOSE, CAPILLARY     Status: Abnormal   Collection Time    05/30/14 12:11 PM      Result Value Ref Range   Glucose-Capillary 112 (*) 70 - 99 mg/dL  GLUCOSE, CAPILLARY     Status: Abnormal   Collection Time    05/30/14  3:58 PM      Result Value Ref Range   Glucose-Capillary 120 (*) 70 - 99 mg/dL   Comment 1 Notify RN     Comment 2 Documented in Chart    GLUCOSE, CAPILLARY     Status: Abnormal   Collection Time    05/30/14  8:17 PM      Result Value Ref Range   Glucose-Capillary 105 (*) 70 - 99 mg/dL   Comment 1 Notify RN     Comment 2 Documented in Chart    GLUCOSE, CAPILLARY     Status: Abnormal   Collection Time    05/30/14 11:47 PM      Result Value Ref Range   Glucose-Capillary 103 (*) 70 - 99 mg/dL   Comment 1 Documented in Chart     Comment 2 Notify RN    GLUCOSE, CAPILLARY     Status: None   Collection Time    05/31/14  3:20 AM      Result Value Ref Range   Glucose-Capillary 90  70 - 99 mg/dL   Comment 1 Documented in Chart     Comment 2 Notify RN    CBC     Status: Abnormal   Collection Time    05/31/14  5:25 AM      Result Value Ref Range   WBC 6.2  4.0 - 10.5 K/uL   RBC 3.40 (*) 3.87 - 5.11 MIL/uL   Hemoglobin 10.1 (*) 12.0 - 15.0 g/dL   HCT 31.3 (*) 36.0 - 46.0 %   MCV 92.1  78.0 - 100.0 fL   MCH 29.7  26.0 - 34.0 pg   MCHC 32.3  30.0 - 36.0 g/dL   RDW 13.0  11.5 - 15.5 %   Platelets 233  150 - 400 K/uL  BASIC METABOLIC PANEL     Status: None   Collection Time    05/31/14  5:25 AM      Result  Value Ref Range   Sodium 144  137 - 147 mEq/L   Potassium 4.1  3.7 - 5.3 mEq/L   Chloride 106  96 - 112 mEq/L   CO2 23  19 - 32 mEq/L   Glucose, Bld 88  70 - 99 mg/dL   BUN 9  6 - 23 mg/dL   Creatinine, Ser 0.71  0.50 - 1.10 mg/dL   Calcium 9.2  8.4 - 10.5 mg/dL   GFR  calc non Af Amer >90  >90 mL/min   GFR calc Af Amer >90  >90 mL/min   Comment: (NOTE)     The eGFR has been calculated using the CKD EPI equation.     This calculation has not been validated in all clinical situations.     eGFR's persistently <90 mL/min signify possible Chronic Kidney     Disease.  GLUCOSE, CAPILLARY     Status: None   Collection Time    05/31/14  7:48 AM      Result Value Ref Range   Glucose-Capillary 94  70 - 99 mg/dL  GLUCOSE, CAPILLARY     Status: None   Collection Time    05/31/14 12:21 PM      Result Value Ref Range   Glucose-Capillary 94  70 - 99 mg/dL  GLUCOSE, CAPILLARY     Status: None   Collection Time    05/31/14  8:23 PM      Result Value Ref Range   Glucose-Capillary 85  70 - 99 mg/dL   Comment 1 Documented in Chart     Comment 2 Notify RN    GLUCOSE, CAPILLARY     Status: None   Collection Time    06/01/14 12:12 AM      Result Value Ref Range   Glucose-Capillary 74  70 - 99 mg/dL   Comment 1 Documented in Chart     Comment 2 Notify RN    GLUCOSE, CAPILLARY     Status: None   Collection Time    06/01/14  3:59 AM      Result Value Ref Range   Glucose-Capillary 77  70 - 99 mg/dL   Comment 1 Documented in Chart     Comment 2 Notify RN    GLUCOSE, CAPILLARY     Status: None   Collection Time    06/01/14  8:12 AM      Result Value Ref Range   Glucose-Capillary 99  70 - 99 mg/dL   Comment 1 Notify RN     Comment 2 Documented in Chart    GLUCOSE, CAPILLARY     Status: None   Collection Time    06/01/14 11:45 AM      Result Value Ref Range   Glucose-Capillary 93  70 - 99 mg/dL   Comment 1 Notify RN     Comment 2 Documented in Chart     Labs are reviewed and are  pertinent for .  Current Facility-Administered Medications  Medication Dose Route Frequency Artemio Dobie Last Rate Last Dose  . 0.9 %  sodium chloride infusion   Intravenous Continuous Raylene Miyamoto, MD 10 mL/hr at 05/31/14 1720    . acetaminophen (TYLENOL) tablet 650 mg  650 mg Oral Q6H PRN Raylene Miyamoto, MD   650 mg at 05/31/14 1949  . antiseptic oral rinse (BIOTENE) solution 15 mL  15 mL Mouth Rinse QID Wilhelmina Mcardle, MD   15 mL at 06/01/14 0356  . chlorhexidine (PERIDEX) 0.12 % solution 15 mL  15 mL Mouth Rinse BID Wilhelmina Mcardle, MD   15 mL at 06/01/14 1033  . feeding supplement (RESOURCE BREEZE) (RESOURCE BREEZE) liquid 1 Container  1 Container Oral TID BM Heather Cornelison Pitts, RD   1 Container at 05/31/14 1100  . heparin injection 5,000 Units  5,000 Units Subcutaneous 3 times per day Wilhelmina Mcardle, MD   5,000 Units at 06/01/14 [phone]  . lacosamide (VIMPAT) 100 mg in sodium  chloride 0.9 % 25 mL IVPB  100 mg Intravenous Q12H Roland Rack, MD   100 mg at 05/31/14 2229  . levETIRAcetam (KEPPRA) tablet 1,500 mg  1,500 mg Oral BID Wilhelmina Mcardle, MD   1,500 mg at 06/01/14 5051  . menthol-cetylpyridinium (CEPACOL) lozenge 3 mg  1 lozenge Oral PRN Raylene Miyamoto, MD   3 mg at 05/31/14 1116  . traMADol (ULTRAM) tablet 50 mg  50 mg Oral Q6H PRN Rigoberto Noel, MD   50 mg at 06/01/14 1044  . valproate (DEPACON) 750 mg in dextrose 5 % 50 mL IVPB  750 mg Intravenous Q12H Roland Rack, MD   750 mg at 06/01/14 1141    Psychiatric Specialty Exam: Physical Exam  ROS  Blood pressure 121/77, pulse 77, temperature 98.8 F (37.1 C), temperature source Oral, resp. rate 20, weight 82.8 kg (182 lb 8.7 oz), SpO2 96.00%.Body mass index is 25.81 kg/(m^2).  General Appearance: Casual  Eye Contact::  Good  Speech:  Clear and Coherent  Volume:  Normal  Mood:  Euthymic  Affect:  Appropriate and Congruent  Thought Process:  Coherent and Goal Directed  Orientation:  Full (Time,  Place, and Person)  Thought Content:  WDL  Suicidal Thoughts:  No  Homicidal Thoughts:  No  Memory:  Immediate;   Fair Recent;   Fair  Judgement:  Intact  Insight:  Good  Psychomotor Activity:  Normal  Concentration:  Good  Recall:  Good  Fund of Knowledge:Good  Language: Good  Akathisia:  NA  Handed:  Right  AIMS (if indicated):     Assets:  Communication Skills Desire for Improvement Financial Resources/Insurance Housing Intimacy Leisure Time [place] Talents/Skills Transportation Vocational/Educational  Sleep:      Musculoskeletal: Strength & Muscle Tone: within normal limits Gait & Station: normal Patient leans: N/A  Treatment Plan Summary: Daily contact with patient to assess and evaluate symptoms and progress in treatment Medication management  JONNALAGADDA,JANARDHAHA R. 06/01/2014 12:22 PM

## 2014-06-04 ENCOUNTER — Encounter: Payer: Self-pay | Admitting: Internal Medicine

## 2014-06-04 MED ORDER — PROMETHAZINE HCL 50 MG PO TABS: 25.0000 mg | ORAL_TABLET | Freq: Four times a day (QID) | ORAL | Status: DC | PRN

## 2014-06-07 ENCOUNTER — Encounter: Payer: Self-pay | Admitting: Internal Medicine

## 2014-06-07 ENCOUNTER — Ambulatory Visit (INDEPENDENT_AMBULATORY_CARE_PROVIDER_SITE_OTHER): Payer: Federal, State, Local not specified - PPO | Admitting: Internal Medicine

## 2014-06-07 VITALS — BP 116/80 | HR 82 | Temp 98.2°F | Wt 184.0 lb

## 2014-06-07 DIAGNOSIS — M2550 Pain in unspecified joint: Secondary | ICD-10-CM

## 2014-06-07 DIAGNOSIS — R569 Unspecified convulsions: Secondary | ICD-10-CM

## 2014-06-07 DIAGNOSIS — R110 Nausea: Secondary | ICD-10-CM

## 2014-06-07 MED ORDER — HYDROCODONE-ACETAMINOPHEN 5-325 MG PO TABS: 1.0000 | ORAL_TABLET | Freq: Three times a day (TID) | ORAL | Status: DC | PRN

## 2014-06-07 NOTE — Patient Instructions (Addendum)

## 2014-06-07 NOTE — Progress Notes (Signed)
Pre visit review using our clinic review tool, if applicable. No additional management support is needed unless otherwise documented below in the visit note. 

## 2014-06-07 NOTE — Progress Notes (Signed)
Subjective:    Patient ID: Brenda MannanWanda Mcguire, female    DOB: 10-Jul-1961, 53 y.o.   MRN: [id number]  HPI  Pt presents to the clinic today for hospital follow up for seizures. [facility] with seizure like activity where she required intubation. She was [facility] in [facility] neuro ICU. CT scan of the head and EEG were negative. Neurology was consulted. It was felt that she was having right partial onset seizures. She was already on depakote and keppra. Vimpat was added. She was advised to follow up with neurology for possible admission to epilepsy unit and inpatient V-EEG monitoring. She is scheduled to see an epilepsy specialist on July 1st. The only concern she has today is that she is having more joint pains. She would like to know if she can go up on her norco temporarily to TID.  Review of Systems  Past Medical History  Diagnosis Date  . GERD (gastroesophageal reflux disease)   . Hyperlipidemia   . History of blood transfusion   . Chronic headaches   . Phlebitis   . History of colonic polyps   . 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 Tonic clonic seizures   . Anxiety     "situational stress"    Current Outpatient Prescriptions  Medication Sig Dispense Refill  . ALPRAZolam (XANAX) 0.5 MG tablet Take 0.5 mg by mouth 3 (three) times daily as needed for anxiety.      Marland Kitchen. amitriptyline (ELAVIL) 25 MG tablet Take 25 mg by mouth at bedtime.      . divalproex (DEPAKOTE) 250 MG DR tablet Take 3 tablets (750 mg total) by mouth 2 (two) times daily.  180 tablet  0  . gabapentin (NEURONTIN) 300 MG capsule Take 600 mg by mouth 3 (three) times daily.      Marland Kitchen. HYDROcodone-acetaminophen (NORCO/VICODIN) 5-325 MG per tablet Take 1 tablet by mouth every 12 (twelve) hours.  60 tablet  0  . hydroxychloroquine (PLAQUENIL) 200 MG tablet Take 1 tablet (200 mg total) by mouth 2 (two) times daily.  60 tablet  3  . Lacosamide (VIMPAT) 100 MG TABS Take 1 tablet (100 mg total) by  mouth 2 (two) times daily.  60 tablet  0  . levETIRAcetam (KEPPRA) 750 MG tablet Take 2 tablets (1,500 mg total) by mouth 2 (two) times daily.  60 tablet  0  . lidocaine (XYLOCAINE) 2 % solution Use as directed 20 mLs in the mouth or throat as needed for mouth pain.  100 mL  0  . promethazine (PHENERGAN) 25 MG suppository Place 25 mg rectally every 8 (eight) hours as needed for nausea.       . promethazine (PHENERGAN) 50 MG tablet Take 0.5 tablets (25 mg total) by mouth every 6 (six) hours as needed for nausea or vomiting.  30 tablet  0   No current facility-administered medications for this visit.    Allergies  Allergen Reactions  . Iohexol Anaphylaxis, Hives and Shortness Of Breath  . Hepatitis B Vaccine Hives    wheezing  . 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  Chest pain  . Codeine Itching and Rash    Family History  Problem Relation Age of Onset  . Diabetes Mother   . Hyperlipidemia Mother   . Stroke Mother   . Hypertension Mother   . Colon cancer Mother   . Breast cancer Mother   . Early death Father   .  Heart attack Father   . Heart disease Father   . Hyperlipidemia Father   . COPD Sister     History   Social History  . Marital Status: Married    Spouse Name: N/A    Number of Children: 2  . Years of Education: 14   Occupational History  . Nurse     [employer]  .     Social History Main Topics  . Smoking status: Never Smoker   . Smokeless tobacco: Never Used  . Alcohol Use: No  . Drug Use: No  . Sexual Activity: Yes    Birth Control/ Protection: Surgical   Other Topics Concern  . Not on file   Social History Narrative   Lives with husband.  They have 2 grown children.   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    Respiratory: Denies difficulty breathing, shortness of breath, cough or sputum production.   Cardiovascular: Denies chest pain, chest tightness, palpitations or swelling in the hands or feet.  Musculoskeletal: Pt reports joint pains. Denies decrease in range of motion, difficulty with gait, muscle pain or joint swelling.  Neurological: Denies dizziness, difficulty with memory, difficulty with speech or problems with balance and coordination.   No other specific complaints in a complete review of systems (except as listed in HPI above).     Objective:   Physical Exam  BP 116/80  Pulse 82  Temp(Src) 98.2 F (36.8 C) (Oral)  Wt 184 lb (83.462 kg)  SpO2 96% Wt Readings from Last 3 Encounters:  06/07/14 184 lb (83.462 kg)  05/31/14 182 lb 8.7 oz (82.8 kg)  03/16/14 189 lb (85.73 kg)    General: Appears her stated age, well developed, well nourished in NAD. Cardiovascular: Normal rate and rhythm. S1,S2 noted.  No murmur, rubs or gallops noted. No JVD or BLE edema. No carotid bruits noted. Pulmonary/Chest: Normal effort and positive vesicular breath sounds. No respiratory distress. No wheezes, rales or ronchi noted.  Musculoskeletal: Normal range of motion. No signs of joint swelling. No difficulty with gait.  Neurological: Alert and oriented. Cranial nerves II-XII intact. Coordination normal. +DTRs bilaterally.    BMET    Component Value Date/Time   NA 144 05/31/2014 0525   K 4.1 05/31/2014 0525   CL 106 05/31/2014 0525   CO2 23 05/31/2014 0525   GLUCOSE 88 05/31/2014 0525   BUN 9 05/31/2014 0525   CREATININE 0.71 05/31/2014 0525   CALCIUM 9.2 05/31/2014 0525   GFRNONAA >90 05/31/2014 0525   GFRAA >90 05/31/2014 0525    Lipid Panel     Component Value Date/Time   CHOL 234* 03/02/2013 1115   TRIG 83.0 03/02/2013 1115   HDL 45.90 03/02/2013 1115   CHOLHDL 5 03/02/2013 1115   VLDL 16.6 03/02/2013 1115    CBC    Component Value Date/Time   WBC 6.2 05/31/2014 0525   RBC 3.40*  05/31/2014 0525   HGB 10.1* 05/31/2014 0525   HCT 31.3* 05/31/2014 0525   PLT 233 05/31/2014 0525   MCV 92.1 05/31/2014 0525   MCH 29.7 05/31/2014 0525   MCHC 32.3 05/31/2014 0525   RDW 13.0 05/31/2014 0525   LYMPHSABS 1.8 05/30/2014 0500   MONOABS 0.8 05/30/2014 0500   EOSABS 0.4 05/30/2014  0500   BASOSABS 0.0 05/30/2014 0500    Hgb A1C No results found for this basename: HGBA1C         Assessment & Plan:   Hospital followup for seizures:  Hospital notes reviewed She has an appt with epilepsy specialist on July 1st Continue AED's for now She has plenty of phenergan for nausea if needed Will increase norco temporarily, new RX given today  RTC as needed

## 2014-06-13 ENCOUNTER — Ambulatory Visit (INDEPENDENT_AMBULATORY_CARE_PROVIDER_SITE_OTHER): Payer: Federal, State, Local not specified - PPO | Admitting: Neurology

## 2014-06-13 ENCOUNTER — Encounter: Payer: Self-pay | Admitting: Neurology

## 2014-06-13 ENCOUNTER — Encounter: Payer: Self-pay | Admitting: *Deleted

## 2014-06-13 VITALS — BP 98/60 | HR 106 | Ht 70.5 in | Wt 182.2 lb

## 2014-06-13 DIAGNOSIS — R569 Unspecified convulsions: Secondary | ICD-10-CM

## 2014-06-13 DIAGNOSIS — G40219 Localization-related (focal) (partial) symptomatic epilepsy and epileptic syndromes with complex partial seizures, intractable, without status epilepticus: Secondary | ICD-10-CM

## 2014-06-13 DIAGNOSIS — M352 Behcet's disease: Secondary | ICD-10-CM
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
## 2014-06-13 NOTE — Patient Instructions (Signed)
1. Continue current doses of Keppra, Depakote, and Vimpat 2. MRI brain with and without contrast 3. Refer to Rheumatology 4. Refer to Ophthalmology

## 2014-06-14 ENCOUNTER — Telehealth: Payer: Self-pay

## 2014-06-14 NOTE — Telephone Encounter (Signed)
Called [phone] left v/m if pt needs assistance to cb and speak with CAN.

## 2014-06-14 NOTE — Telephone Encounter (Signed)
Noted. Thanks.  Routed to PCP as FYI.  

## 2014-06-14 NOTE — Telephone Encounter (Signed)
[facility] (After Hours Triage) Fax: [phone] From: [facility] Date/ Time: 06/13/2014 8:01 PM Taken By: Jari SportsmanBobbie Jo Dobson-Trail, RN Caller: Brenda MortimerWanda Facility: Not Collected Patient: Brenda Mcguire, Brenda Mcguire DOB: 1961-01-15 Phone: [phone] Reason for Call: Caller was unable to be reached on callback - Left Message Regarding Appointment: No Appt Date: Appt Time: Unknown Provider: Reason: Details: Outcome:

## 2014-06-16 ENCOUNTER — Encounter: Payer: Self-pay | Admitting: Neurology

## 2014-06-18 ENCOUNTER — Telehealth: Payer: Self-pay | Admitting: Family Medicine

## 2014-06-18 NOTE — Telephone Encounter (Signed)
Confidential Office Message [address] [place] [address] p. [phone] f. [phone] To: [facility] (After Hours Triage) Fax: [phone] From: [facility] Date/ Time: 06/13/2014 8:01 PM Taken By: Jari SportsmanBobbie Jo Dobson-Trail, RN Caller: Burna MortimerWanda Facility: Not Collected Patient: Brenda Mcguire, Brenda Mcguire DOB: 10-Mar-1961 Phone: [phone] Reason for Call: Caller was unable to be reached on callback - Left Message Regarding Appointment: No Appt Date: Appt Time: Unknown Provider: Reason: Details: Outcome: Confidential

## 2014-06-19 ENCOUNTER — Ambulatory Visit: Payer: Federal, State, Local not specified - PPO | Admitting: Neurology

## 2014-06-21 ENCOUNTER — Encounter: Payer: Self-pay | Admitting: Internal Medicine

## 2014-06-23 ENCOUNTER — Emergency Department (HOSPITAL_COMMUNITY): Payer: Federal, State, Local not specified - PPO

## 2014-06-23 ENCOUNTER — Encounter (HOSPITAL_COMMUNITY): Payer: Self-pay | Admitting: Emergency Medicine

## 2014-06-23 ENCOUNTER — Emergency Department (HOSPITAL_COMMUNITY)
Admission: EM | Admit: 2014-06-23 | Discharge: 2014-06-23 | Disposition: A | Discharge status: Home / Self Care | Payer: Federal, State, Local not specified - PPO | Attending: Emergency Medicine | Admitting: Emergency Medicine

## 2014-06-23 ENCOUNTER — Other Ambulatory Visit (HOSPITAL_COMMUNITY): Payer: Federal, State, Local not specified - PPO

## 2014-06-23 DIAGNOSIS — Z860100 Personal history of colon polyps, unspecified: Secondary | ICD-10-CM | POA: Insufficient documentation

## 2014-06-23 DIAGNOSIS — Z8639 Personal history of other endocrine, nutritional and metabolic disease: Secondary | ICD-10-CM | POA: Insufficient documentation

## 2014-06-23 DIAGNOSIS — Z8619 Personal history of other infectious and parasitic diseases: Secondary | ICD-10-CM | POA: Insufficient documentation

## 2014-06-23 DIAGNOSIS — Z862 Personal history of diseases of the blood and blood-forming organs and certain disorders involving the immune mechanism: Secondary | ICD-10-CM | POA: Insufficient documentation

## 2014-06-23 DIAGNOSIS — Z79899 Other long term (current) drug therapy: Secondary | ICD-10-CM | POA: Insufficient documentation

## 2014-06-23 DIAGNOSIS — F411 Generalized anxiety disorder: Secondary | ICD-10-CM | POA: Insufficient documentation

## 2014-06-23 DIAGNOSIS — G40909 Epilepsy, unspecified, not intractable, without status epilepticus: Secondary | ICD-10-CM | POA: Insufficient documentation

## 2014-06-23 DIAGNOSIS — Z8719 Personal history of other diseases of the digestive system: Secondary | ICD-10-CM | POA: Insufficient documentation

## 2014-06-23 DIAGNOSIS — R51 Headache: Secondary | ICD-10-CM | POA: Insufficient documentation

## 2014-06-23 DIAGNOSIS — R42 Dizziness and giddiness: Secondary | ICD-10-CM

## 2014-06-23 DIAGNOSIS — Z8679 Personal history of other diseases of the circulatory system: Secondary | ICD-10-CM | POA: Insufficient documentation

## 2014-06-23 DIAGNOSIS — Z8669 Personal history of other diseases of the nervous system and sense organs: Secondary | ICD-10-CM

## 2014-06-23 DIAGNOSIS — R000 Tachycardia, unspecified: Secondary | ICD-10-CM | POA: Insufficient documentation

## 2014-06-23 MED ORDER — MECLIZINE HCL 25 MG PO TABS: 25.0000 mg | ORAL_TABLET | Freq: Three times a day (TID) | ORAL | Status: DC | PRN

## 2014-06-23 MED ORDER — MECLIZINE HCL 25 MG PO TABS: 25.0000 mg | ORAL_TABLET | Freq: Once | ORAL | Status: DC

## 2014-06-23 NOTE — ED Provider Notes (Signed)
CSN: [id number]     Arrival date & time 06/23/14  1244 History   First MD Initiated Contact with Patient 06/23/14 1247     No chief complaint on file.    (Consider location/radiation/quality/duration/timing/severity/associated sxs/prior Treatment) HPI Comments: This is a 53 year old female with a history of Behcet's disease, and seizures since 2010 presenting to the Emergency Department with a chief complaint of dizziness for 3 days.  The patient reports constant dizziness worsened by head movement.  Patient also complains of headache for 1-2 days.  She reports generalized headache, similar to previous.  The pateint reports last seizure, over 3 weeks ago with last admission. She reports compliance with her Keppra, Depakote, Vimpat. Neurologist: Karel Jarvis  Patient is a 53 y.o. female presenting with seizures. The history is provided by the patient and the EMS personnel. No language interpreter was used.  Seizures Seizure activity on arrival: no     Past Medical History  Diagnosis Date  . GERD (gastroesophageal reflux disease)   . Hyperlipidemia   . History of blood transfusion   . Chronic headaches   . Phlebitis   . History of colonic polyps   . 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 Tonic clonic seizures   . Anxiety     "situational stress"   Past Surgical History  Procedure Laterality Date  . Cholecystectomy    . Tonsillectomy  1970  . Appendectomy  1975  . Lumbar fusion  1999    L5-S1  . Back surgery    . Abdominal hysterectomy  1988  . Portacath placement Left 2008  . Stone extraction with basket     Family History  Problem Relation Age of Onset  . Diabetes Mother   . Hyperlipidemia Mother   . Stroke Mother   . Hypertension Mother   . Colon cancer Mother   . Breast cancer Mother   . Early death Father   . Heart attack Father   . Heart disease Father   . Hyperlipidemia Father   . COPD Sister    History  Substance Use Topics  . Smoking status: Never Smoker    . Smokeless tobacco: Never Used  . Alcohol Use: No   OB History   Grav Para Term Preterm Abortions TAB SAB Ect Mult Living                 Review of Systems  Constitutional: Negative for fever and chills.  Gastrointestinal: Negative for nausea, vomiting and abdominal pain.  Musculoskeletal: Negative for neck stiffness.  Neurological: Positive for dizziness and headaches. Negative for seizures, syncope, facial asymmetry, weakness, light-headedness and numbness.      Allergies  Iohexol; Hepatitis b vaccine; Imitrex; and Codeine  Home Medications   Prior to Admission medications   Medication Sig Start Date End Date Taking? Authorizing Provider  ALPRAZolam Prudy Feeler) 0.5 MG tablet Take 0.5 mg by mouth 3 (three) times daily as needed for anxiety.    Historical Provider, MD  amitriptyline (ELAVIL) 25 MG tablet Take 25 mg by mouth at bedtime.    Historical Provider, MD  divalproex (DEPAKOTE) 250 MG DR tablet Take 3 tablets (750 mg total) by mouth 2 (two) times daily. 06/01/14   Calvert Cantor, MD  gabapentin (NEURONTIN) 300 MG capsule Take 600 mg by mouth 3 (three) times daily.    Historical Provider, MD  HYDROcodone-acetaminophen (NORCO/VICODIN) 5-325 MG per tablet Take 1 tablet by mouth 3 (three) times daily as needed for moderate pain.  06/07/14   Nicki Reaper, NP  hydroxychloroquine (PLAQUENIL) 200 MG tablet Take 1 tablet (200 mg total) by mouth 2 (two) times daily. 03/13/14   Nicki Reaper, NP  Lacosamide (VIMPAT) 100 MG TABS Take 1 tablet (100 mg total) by mouth 2 (two) times daily. 06/01/14   Calvert Cantor, MD  levETIRAcetam (KEPPRA) 750 MG tablet Take 2 tablets (1,500 mg total) by mouth 2 (two) times daily. 06/01/14   Calvert Cantor, MD  lidocaine (XYLOCAINE) 2 % solution Use as directed 20 mLs in the mouth or throat as needed for mouth pain. 05/02/14   Nicki Reaper, NP  promethazine (PHENERGAN) 25 MG suppository Place 25 mg rectally every 8 (eight) hours as needed for nausea.  03/06/14    Historical Provider, MD  promethazine (PHENERGAN) 50 MG tablet Take 0.5 tablets (25 mg total) by mouth every 6 (six) hours as needed for nausea or vomiting. 06/04/14   Nicki Reaper, NP   There were no vitals taken for this visit. Physical Exam  Nursing note and vitals reviewed. Constitutional: She is oriented to person, place, and time. She appears well-developed and well-nourished.  Non-toxic appearance. She does not have a sickly appearance. She does not appear ill. No distress.  HENT:  Head: Normocephalic and atraumatic.  Eyes: Conjunctivae are normal. Pupils are equal, round, and reactive to light.  Neck: Normal range of motion. Neck supple.  Cardiovascular: Regular rhythm.  Tachycardia present.   Pulmonary/Chest: Effort normal and breath sounds normal. Not tachypneic. No respiratory distress. She has no decreased breath sounds. She has no wheezes. She has no rhonchi.  Abdominal: Soft. There is no tenderness. There is no rebound and no guarding.  Musculoskeletal: Normal range of motion.  Neurological: She is alert and oriented to person, place, and time.  Disoriented to day of month and year stating it is 06/22/2009. Oriented to person and place. Mild vertical nystagmus. Speech is clear and goal oriented, follows commands no facial droop Decreasing grip strengths to right hand. Normal and equal sensation to bilateral upper terminates. Normal strength in lower extremities bilaterally. Normal gait. Sensation normal to light and sharp touch Moves all 4 extremities without ataxia, coordination intact Normal finger to nose and rapid alternating movements No pronator drift  Skin: Skin is warm and dry. She is not diaphoretic.  Psychiatric: She has a normal mood and affect. Her behavior is normal.    ED Course  Procedures (including critical care time) Labs Review Labs Reviewed - No data to display  Imaging Review Ct Head Wo Contrast  06/23/2014   CLINICAL DATA:  Seizure disorder   EXAM: CT HEAD WITHOUT CONTRAST  TECHNIQUE: Contiguous axial images were obtained from the base of the skull through the vertex without intravenous contrast.  COMPARISON:  Head CT 07/28/2014  FINDINGS: No acute intracranial hemorrhage. No focal mass lesion. No CT evidence of acute infarction. No midline shift or mass effect. No hydrocephalus. Basilar cisterns are patent. Paranasal sinuses and mastoid air cells are clear.  IMPRESSION: Normal head CT   Electronically Signed   By: Genevive Bi M.D.   On: 06/23/2014 14:49     EKG Interpretation None      MDM   Final diagnoses:  Dizziness  History of seizure disorder   Patient is a 53 year old female with significant neurologic history presenting with vertigo like symptoms. She does have nystagmus on exam, decrease in right grip strength. Seizure protocol ordered. Pt declined testing and meclizine and will sign out AMA.  Pt agrees  to CT and meclizine. CT negative. Will treat for vertigo and follow up with Neurology.   Meds given in ED:  Medications  meclizine (ANTIVERT) tablet 25 mg (25 mg Oral Not Given 06/23/14 1419)    Discharge Medication List as of 06/23/2014  1:51 PM    START taking these medications   Details  meclizine (ANTIVERT) 25 MG tablet Take 1 tablet (25 mg total) by mouth 3 (three) times daily as needed for dizziness., Starting 06/23/2014, Until Discontinued, Print            Clabe SealLauren M Krue Peterka, PA-C 06/23/14 1512  Clabe SealLauren M Marbeth Smedley, PA-C 06/23/14 1513

## 2014-06-23 NOTE — ED Notes (Signed)
Pt arrived from home by PTAR. Pt has Neuro Behcet's which causes her to have seizures. Husband told [facility] that pt has been having multiple seizures today and called MD who told them to send her here for a CT scan. Pt stated "I am fine, I don't know why they made me come here, I know when I am about to have a seizure and the only thing that has been happening today is become dizzy"

## 2014-06-23 NOTE — ED Notes (Signed)
Pt stated that it is fine for her husband to come back to room when he gets here.
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
## 2014-06-23 NOTE — Discharge Instructions (Signed)
Please return to the emergency department at any time for a complete evaluation of your dizziness. Call for a follow up appointment with a Family or Primary Care Provider.  Call for a followup appointment with your neurologist. Return if Symptoms worsen.   Take medication as prescribed.

## 2014-06-24 ENCOUNTER — Inpatient Hospital Stay
Admission: RE | Admit: 2014-06-24 | Discharge: 2014-06-24 | Disposition: A | Discharge status: Home / Self Care | Payer: Federal, State, Local not specified - PPO | Source: Ambulatory Visit | Attending: Neurology | Admitting: Neurology

## 2014-06-25 ENCOUNTER — Other Ambulatory Visit: Payer: Self-pay | Admitting: Internal Medicine

## 2014-06-25 ENCOUNTER — Encounter: Payer: Self-pay | Admitting: Internal Medicine

## 2014-06-25 ENCOUNTER — Other Ambulatory Visit: Payer: Self-pay

## 2014-06-25 MED ORDER — PROMETHAZINE HCL 12.5 MG PO TABS: 12.5000 mg | ORAL_TABLET | Freq: Four times a day (QID) | ORAL | Status: DC | PRN

## 2014-06-25 MED ORDER — AMITRIPTYLINE HCL 25 MG PO TABS: 25.0000 mg | ORAL_TABLET | Freq: Every day | ORAL | Status: DC

## 2014-06-25 MED ORDER — ALPRAZOLAM 0.5 MG PO TABS: 0.5000 mg | ORAL_TABLET | Freq: Three times a day (TID) | ORAL | Status: DC | PRN

## 2014-06-25 NOTE — Telephone Encounter (Signed)
Rx called in to pharmacy. 

## 2014-06-27 ENCOUNTER — Encounter: Payer: Self-pay | Admitting: Internal Medicine

## 2014-06-27 ENCOUNTER — Ambulatory Visit (INDEPENDENT_AMBULATORY_CARE_PROVIDER_SITE_OTHER): Payer: Federal, State, Local not specified - PPO | Admitting: Internal Medicine

## 2014-06-27 VITALS — BP 118/80 | HR 95 | Temp 98.7°F | Wt 181.0 lb

## 2014-06-27 DIAGNOSIS — R42 Dizziness and giddiness: Secondary | ICD-10-CM

## 2014-06-27 MED ORDER — DIAZEPAM 5 MG PO TABS: 5.0000 mg | ORAL_TABLET | Freq: Two times a day (BID) | ORAL | Status: DC | PRN

## 2014-06-27 NOTE — Progress Notes (Signed)
Subjective:    Patient ID: Brenda MannanWanda Mcguire, female    DOB: 24-May-1961, 53 y.o.   MRN: [id number]  HPI  Pt presents to the clinic today with c/o dizziness. She reports this started 1 week ago. She describes it like the room is spinning. It occurs even when she is sitting down. It seems worse when she stands up and better when she lays down. She did call neuro and discussed her symptoms with them. They advised her to see her PCP to r/o orthostatic hypotension. She reports that she has tried Meclizine without relief. She is staying well hydrated. She does feel very stressed at the moment. She does have upcoming appointments with neurology, rheumatology and opthamology.  Review of Systems  Past Medical History  Diagnosis Date  . GERD (gastroesophageal reflux disease)   . Hyperlipidemia   . History of blood transfusion   . Chronic headaches   . Phlebitis   . History of colonic polyps   . 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 Tonic clonic seizures   . Anxiety     "situational stress"    Current Outpatient Prescriptions  Medication Sig Dispense Refill  . ALPRAZolam (XANAX) 0.5 MG tablet Take 1 tablet (0.5 mg total) by mouth 3 (three) times daily as needed for anxiety.  90 tablet  0  . amitriptyline (ELAVIL) 25 MG tablet Take 1 tablet (25 mg total) by mouth at bedtime.  30 tablet  1  . divalproex (DEPAKOTE) 250 MG DR tablet Take 750 mg by mouth 3 (three) times daily.      Marland Kitchen. HYDROcodone-acetaminophen (NORCO/VICODIN) 5-325 MG per tablet Take 1 tablet by mouth 3 (three) times daily as needed for moderate pain.  90 tablet  0  . Lacosamide (VIMPAT) 100 MG TABS Take 1 tablet (100 mg total) by mouth 2 (two) times daily.  60 tablet  0  . levETIRAcetam (KEPPRA) 750 MG tablet Take 2 tablets (1,500 mg total) by mouth 2 (two) times daily.  60 tablet  0  . meclizine (ANTIVERT) 25 MG tablet Take 1 tablet (25 mg total) by mouth 3 (three) times daily as needed for dizziness.  30 tablet  0  . promethazine  (PHENERGAN) 12.5 MG tablet Take 1 tablet (12.5 mg total) by mouth every 6 (six) hours as needed for nausea or vomiting.  30 tablet  0   No current facility-administered medications for this visit.    Allergies  Allergen Reactions  . Iohexol Anaphylaxis, Hives and Shortness Of Breath  . Hepatitis B Vaccine Hives    wheezing  . 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  Chest pain  . Codeine Itching and Rash    Family History  Problem Relation Age of Onset  . Diabetes Mother   . Hyperlipidemia Mother   . Stroke Mother   . Hypertension Mother   . Colon cancer Mother   . Breast cancer Mother   . Early death Father   . Heart attack Father   . Heart disease Father   . Hyperlipidemia Father   . COPD Sister     History   Social History  . Marital Status: Married    Spouse Name: N/A    Number of Children: 2  . Years of Education: 14   Occupational History  . Nurse     [employer]  .     Social History Main Topics  . Smoking status: Never Smoker   . Smokeless  tobacco: Never Used  . Alcohol Use: No  . Drug Use: No  . Sexual Activity: Yes    Birth Control/ Protection: Surgical   Other Topics Concern  . Not on file   Social History Narrative   Lives with husband.  They have 2 grown children.   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 Denies fever, malaise, headache or abrupt weight changes.  Respiratory: Denies difficulty breathing, shortness of breath, cough or sputum production.   Cardiovascular: Denies chest pain, chest tightness, palpitations or swelling in the hands or feet.  Neurological: Pt reports dizziness. Denies difficulty with memory, difficulty with speech or problems.  No other specific complaints in a complete review of systems (except as listed in HPI above).     Objective:    Physical Exam   BP 126/88  Pulse 93  Temp(Src) 98.7 F (37.1 C) (Oral)  Wt 181 lb (82.101 kg)  SpO2 98% Wt Readings from Last 3 Encounters:  06/27/14 181 lb (82.101 kg)  06/13/14 182 lb 3.2 oz (82.645 kg)  06/07/14 184 lb (83.462 kg)    General: Appears her stated age, well developed, well nourished in NAD. Cardiovascular: Normal rate and rhythm. S1,S2 noted.  No murmur, rubs or gallops noted. No JVD or BLE edema. No carotid bruits noted. Pulmonary/Chest: Normal effort and positive vesicular breath sounds. No respiratory distress. No wheezes, rales or ronchi noted.  Neurological: Alert and oriented. Cranial nerves II-XII intact. Coordination normal. +DTRs bilaterally. Positive Romberg. Psychiatric: Mood tearful and affect normal. Behavior is normal. Judgment and thought content normal.     BMET    Component Value Date/Time   NA 144 05/31/2014 0525   K 4.1 05/31/2014 0525   CL 106 05/31/2014 0525   CO2 23 05/31/2014 0525   GLUCOSE 88 05/31/2014 0525   BUN 9 05/31/2014 0525   CREATININE 0.71 05/31/2014 0525   CALCIUM 9.2 05/31/2014 0525   GFRNONAA >90 05/31/2014 0525   GFRAA >90 05/31/2014 0525    Lipid Panel     Component Value Date/Time   CHOL 234* 03/02/2013 1115   TRIG 83.0 03/02/2013 1115   HDL 45.90 03/02/2013 1115   CHOLHDL 5 03/02/2013 1115   VLDL 16.6 03/02/2013 1115    CBC    Component Value Date/Time   WBC 6.2 05/31/2014 0525   RBC 3.40* 05/31/2014 0525   HGB 10.1* 05/31/2014 0525   HCT 31.3* 05/31/2014 0525   PLT 233 05/31/2014 0525   MCV 92.1 05/31/2014 0525   MCH 29.7 05/31/2014 0525   MCHC 32.3 05/31/2014 0525   RDW 13.0 05/31/2014 0525   LYMPHSABS 1.8 05/30/2014 0500   MONOABS 0.8 05/30/2014 0500   EOSABS 0.4 05/30/2014 0500   BASOSABS 0.0 05/30/2014 0500    Hgb A1C No results found for this basename: HGBA1C        Assessment & Plan:   Dizziness:  Orthostatics: normal ? Vimpat- only new medication she has started She has failed Meclizine Will try  Valium- let me know by Friday how you feel Keep your upcoming appts  Work note provided to excuse for the last 3 days and return to work tomorrow if able  RTC as needed or if symptoms persist or worsen

## 2014-06-27 NOTE — Patient Instructions (Addendum)

## 2014-06-27 NOTE — Progress Notes (Signed)
Pre visit review using our clinic review tool, if applicable. No additional management support is needed unless otherwise documented below in the visit note. 

## 2014-06-28 ENCOUNTER — Encounter: Payer: Self-pay | Admitting: Neurology

## 2014-06-29 ENCOUNTER — Telehealth: Payer: Self-pay | Admitting: Neurology

## 2014-06-29 DIAGNOSIS — R569 Unspecified convulsions: Secondary | ICD-10-CM

## 2014-06-29 MED ORDER — TOPIRAMATE 25 MG PO TABS: ORAL_TABLET | ORAL | Status: DC

## 2014-06-29 NOTE — Telephone Encounter (Signed)
Pt is returning your call please call [phone]

## 2014-06-29 NOTE — Telephone Encounter (Signed)
She is feeling dizzy on Vimpat. Would like to switch to different medication. She had been told about Topamax in the past. Discussed that we can start uptitration of Topamax, start 25mg  BID x 1 week, then 50mg  BID x 1 week, then 100mg  BID.  She cannot stop Vimpat suddenly, she will continue on Vimpat 100mg  BID during Topamax uptitration, then at week 4 start reducing Vimpat to 50mg  BID x 1 week, then 50mg  daily x 1 week, then stop. Pt expressed understanding. Side effects of Topamax discussed. Discussed risks of breakthrough seizures anytime med adjustments are made. We discussed that if she is on therapeutic dose Topamax, Depakote, and Keppra and has another seizure, it would be important to classify seizures in inpatient vEEG stay.

## 2014-06-29 NOTE — Telephone Encounter (Signed)
Tried calling all 3 numbers listed, left voicemail on 2 numbers, no VM option on [phone].  Called [phone], [phone].  Got email about Topamax, will discuss plan to start TPX, side effects, and tapering schedule for Vimpat.

## 2014-07-05 ENCOUNTER — Encounter: Payer: Self-pay | Admitting: Internal Medicine

## 2014-07-06 ENCOUNTER — Encounter: Payer: Self-pay | Admitting: Neurology

## 2014-07-06 ENCOUNTER — Other Ambulatory Visit: Payer: Self-pay | Admitting: Rheumatology

## 2014-07-06 LAB — URINALYSIS, COMPLETE
BLOOD: NEGATIVE
Bacteria: NONE SEEN
Bilirubin,UR: NEGATIVE
GLUCOSE, UR: NEGATIVE mg/dL (ref 0–75)
KETONE: NEGATIVE
NITRITE: NEGATIVE
PH: 5 (ref 4.5–8.0)
Protein: NEGATIVE
Specific Gravity: 1.025 (ref 1.003–1.030)
WBC UR: 34 /HPF (ref 0–5)

## 2014-07-06 LAB — COMPREHENSIVE METABOLIC PANEL
ANION GAP: 10 (ref 7–16)
Albumin: 3.7 g/dL (ref 3.4–5.0)
Alkaline Phosphatase: 99 U/L
BUN: 22 mg/dL — ABNORMAL HIGH (ref 7–18)
Bilirubin,Total: 0.2 mg/dL (ref 0.2–1.0)
CALCIUM: 9 mg/dL (ref 8.5–10.1)
CREATININE: 1.07 mg/dL (ref 0.60–1.30)
Chloride: 109 mmol/L — ABNORMAL HIGH (ref 98–107)
Co2: 23 mmol/L (ref 21–32)
GLUCOSE: 124 mg/dL — AB (ref 65–99)
Osmolality: 288 (ref 275–301)
Potassium: 4.2 mmol/L (ref 3.5–5.1)
SGOT(AST): 29 U/L (ref 15–37)
SGPT (ALT): 29 U/L
Sodium: 142 mmol/L (ref 136–145)
TOTAL PROTEIN: 7.4 g/dL (ref 6.4–8.2)

## 2014-07-06 LAB — CBC WITH DIFFERENTIAL/PLATELET
Basophil #: 0 10*3/uL (ref 0.0–0.1)
Basophil %: 0.3 %
Eosinophil #: 0.4 10*3/uL (ref 0.0–0.7)
Eosinophil %: 4.7 %
HCT: 36.7 % (ref 35.0–47.0)
HGB: 12.3 g/dL (ref 12.0–16.0)
LYMPHS ABS: 2.5 10*3/uL (ref 1.0–3.6)
LYMPHS PCT: 27.6 %
MCH: 31.4 pg (ref 26.0–34.0)
MCHC: 33.5 g/dL (ref 32.0–36.0)
MCV: 94 fL (ref 80–100)
MONO ABS: 0.8 x10 3/mm (ref 0.2–0.9)
MONOS PCT: 8.7 %
NEUTROS ABS: 5.3 10*3/uL (ref 1.4–6.5)
Neutrophil %: 58.7 %
PLATELETS: 285 10*3/uL (ref 150–440)
RBC: 3.92 10*6/uL (ref 3.80–5.20)
RDW: 13.6 % (ref 11.5–14.5)
WBC: 9 10*3/uL (ref 3.6–11.0)

## 2014-07-07 ENCOUNTER — Encounter: Payer: Self-pay | Admitting: Internal Medicine

## 2014-07-07 LAB — URINE CULTURE

## 2014-07-09 ENCOUNTER — Other Ambulatory Visit: Payer: Self-pay | Admitting: Internal Medicine

## 2014-07-09 MED ORDER — HYDROCODONE-ACETAMINOPHEN 5-325 MG PO TABS
1.0000 | ORAL_TABLET | Freq: Three times a day (TID) | ORAL | Status: DC | PRN
Start: 2014-07-09 — End: 2014-08-09

## 2014-07-10 ENCOUNTER — Encounter: Payer: Self-pay | Admitting: Internal Medicine

## 2014-07-11 ENCOUNTER — Telehealth: Payer: Self-pay | Admitting: Neurology

## 2014-07-11 NOTE — Telephone Encounter (Signed)
Please confirm w/ pt once FMLA paperwork has been faxed. It must be faxed to her employer today. Pt Cb# [phone] / Sherri S.

## 2014-07-11 NOTE — Telephone Encounter (Signed)
Faxed and patient notified. 

## 2014-07-16 ENCOUNTER — Encounter: Payer: Self-pay | Admitting: Internal Medicine

## 2014-07-16 ENCOUNTER — Other Ambulatory Visit: Payer: Self-pay | Admitting: Internal Medicine

## 2014-07-16 MED ORDER — PROMETHAZINE HCL 12.5 MG PO TABS: 12.5000 mg | ORAL_TABLET | Freq: Four times a day (QID) | ORAL | Status: DC | PRN

## 2014-07-18 ENCOUNTER — Ambulatory Visit (HOSPITAL_COMMUNITY)
Admission: RE | Admit: 2014-07-18 | Discharge: 2014-07-18 | Disposition: A | Discharge status: Home / Self Care | Payer: Federal, State, Local not specified - PPO | Source: Ambulatory Visit | Attending: Internal Medicine | Admitting: Internal Medicine

## 2014-07-18 ENCOUNTER — Encounter: Payer: Self-pay | Admitting: Internal Medicine

## 2014-07-18 ENCOUNTER — Telehealth: Payer: Self-pay | Admitting: Internal Medicine

## 2014-07-18 ENCOUNTER — Ambulatory Visit (INDEPENDENT_AMBULATORY_CARE_PROVIDER_SITE_OTHER): Payer: Federal, State, Local not specified - PPO | Admitting: Internal Medicine

## 2014-07-18 ENCOUNTER — Telehealth: Payer: Self-pay | Admitting: *Deleted

## 2014-07-18 VITALS — BP 122/74 | HR 72 | Temp 98.6°F | Wt 184.8 lb

## 2014-07-18 DIAGNOSIS — M79661 Pain in right lower leg: Secondary | ICD-10-CM

## 2014-07-18 DIAGNOSIS — M79609 Pain in unspecified limb: Secondary | ICD-10-CM

## 2014-07-18 DIAGNOSIS — M7989 Other specified soft tissue disorders: Secondary | ICD-10-CM

## 2014-07-18 DIAGNOSIS — Z0279 Encounter for issue of other medical certificate: Secondary | ICD-10-CM

## 2014-07-18 DIAGNOSIS — M79604 Pain in right leg: Secondary | ICD-10-CM

## 2014-07-18 NOTE — Telephone Encounter (Signed)
Received call report from [facility]:  Negative for DVT in right leg.

## 2014-07-18 NOTE — Progress Notes (Signed)
Subjective:    Patient ID: Brenda Mcguire, female    DOB: 17-Jan-1961, 53 y.o.   MRN: [id number]  HPI  Pt presents to the clinic today with c/o swelling and pain of the right leg. She reports that it started this morning. It came on very quickly. There is pain in her calf and the area is very tender to touch. She denies chest pain or shortness of breath. She does have a history of thrombophlebitis but reports this feels different. She denies any injury to the leg.  Review of Systems      Past Medical History  Diagnosis Date  . GERD (gastroesophageal reflux disease)   . Hyperlipidemia   . History of blood transfusion   . Chronic headaches   . Phlebitis   . History of colonic polyps   . 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 Tonic clonic seizures   . Anxiety     "situational stress"    Current Outpatient Prescriptions  Medication Sig Dispense Refill  . ALPRAZolam (XANAX) 0.5 MG tablet Take 1 tablet (0.5 mg total) by mouth 3 (three) times daily as needed for anxiety.  90 tablet  0  . amitriptyline (ELAVIL) 25 MG tablet Take 1 tablet (25 mg total) by mouth at bedtime.  30 tablet  1  . cyclophosphamide (CYTOXAN) 25 MG tablet Take 75 mg by mouth daily. Give on an empty stomach 1 hour before or 2 hours after meals.      . diazepam (VALIUM) 5 MG tablet Take 1 tablet (5 mg total) by mouth every 12 (twelve) hours as needed for anxiety.  60 tablet  0  . divalproex (DEPAKOTE) 250 MG DR tablet Take 750 mg by mouth 3 (three) times daily.      Marland Kitchen. HYDROcodone-acetaminophen (NORCO/VICODIN) 5-325 MG per tablet Take 1 tablet by mouth 3 (three) times daily as needed for moderate pain.  90 tablet  0  . hydroxychloroquine (PLAQUENIL) 200 MG tablet Take 200 mg by mouth 2 (two) times daily.      . Lacosamide (VIMPAT) 100 MG TABS Take 1 tablet (100 mg total) by mouth 2 (two) times daily.  60 tablet  0  . levETIRAcetam (KEPPRA) 750 MG tablet Take 2 tablets (1,500 mg total) by mouth 2 (two) times daily.   60 tablet  0  . meclizine (ANTIVERT) 25 MG tablet Take 1 tablet (25 mg total) by mouth 3 (three) times daily as needed for dizziness.  30 tablet  0  . predniSONE (DELTASONE) 20 MG tablet Take 20 mg by mouth daily with breakfast.      . promethazine (PHENERGAN) 12.5 MG tablet Take 1 tablet (12.5 mg total) by mouth every 6 (six) hours as needed for nausea or vomiting.  30 tablet  0  . sulfamethoxazole-trimethoprim (BACTRIM DS) 800-160 MG per tablet Take 1 tablet by mouth 3 (three) times a week. M,W,F      . topiramate (TOPAMAX) 25 MG tablet Take 1 tab twice a day for 1 week, then 2 tabs twice a day for 1 week, then 4 tabs twice a day  154 tablet  1   No current facility-administered medications for this visit.    Allergies  Allergen Reactions  . Iohexol Anaphylaxis, Hives and Shortness Of Breath  . Hepatitis B Vaccine Hives    wheezing  . 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  Chest pain  . Codeine Itching and Rash    Family History  Problem Relation Age of Onset  . Diabetes Mother   . Hyperlipidemia Mother   . Stroke Mother   . Hypertension Mother   . Colon cancer Mother   . Breast cancer Mother   . Early death Father   . Heart attack Father   . Heart disease Father   . Hyperlipidemia Father   . COPD Sister     History   Social History  . Marital Status: Married    Spouse Name: N/A    Number of Children: 2  . Years of Education: 14   Occupational History  . Nurse     [employer]  .     Social History Main Topics  . Smoking status: Never Smoker   . Smokeless tobacco: Never Used  . Alcohol Use: No  . Drug Use: No  . Sexual Activity: Yes    Birth Control/ Protection: Surgical   Other Topics Concern  . Not on file   Social History Narrative   Lives with husband.  They have 2 grown children.   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  Respiratory: Denies difficulty breathing, shortness of breath, cough or sputum production.   Cardiovascular: Pt reports leg swelling. Denies chest pain, chest tightness, palpitations or swelling in the hands.   Skin: Denies redness, rashes, lesions or ulcercations.   No other specific complaints in a complete review of systems (except as listed in HPI above).  Objective:   Physical Exam   BP 122/74  Pulse 72  Temp(Src) 98.6 F (37 C) (Oral)  Wt 184 lb 12 oz (83.802 kg) Wt Readings from Last 3 Encounters:  07/18/14 184 lb 12 oz (83.802 kg)  06/27/14 181 lb (82.101 kg)  06/13/14 182 lb 3.2 oz (82.645 kg)    General: Appears her stated age, well developed, well nourished in NAD. Skin: Slight redness of right lower leg. Cardiovascular: Normal rate and rhythm. S1,S2 noted.  No murmur, rubs or gallops noted. 1+ swelling of RLE and ankle. Very tender to palpation. + Homans. Pulmonary/Chest: Normal effort and positive vesicular breath sounds. No respiratory distress. No wheezes, rales or ronchi noted.    BMET    Component Value Date/Time   NA 144 05/31/2014 0525   K 4.1 05/31/2014 0525   CL 106 05/31/2014 0525   CO2 23 05/31/2014 0525   GLUCOSE 88 05/31/2014 0525   BUN 9 05/31/2014 0525   CREATININE 0.71 05/31/2014 0525   CALCIUM 9.2 05/31/2014 0525   GFRNONAA >90 05/31/2014 0525   GFRAA >90 05/31/2014 0525    Lipid Panel     Component Value Date/Time   CHOL 234* 03/02/2013 1115   TRIG 83.0 03/02/2013 1115   HDL 45.90 03/02/2013 1115   CHOLHDL 5 03/02/2013 1115   VLDL 16.6 03/02/2013 1115    CBC    Component Value Date/Time   WBC 6.2 05/31/2014 0525   RBC 3.40* 05/31/2014 0525   HGB 10.1* 05/31/2014 0525   HCT 31.3* 05/31/2014 0525   PLT 233 05/31/2014 0525   MCV 92.1 05/31/2014 0525   MCH 29.7 05/31/2014 0525   MCHC 32.3 05/31/2014 0525   RDW 13.0 05/31/2014 0525   LYMPHSABS  1.8 05/30/2014 0500   MONOABS 0.8 05/30/2014 0500   EOSABS 0.4 05/30/2014  0500   BASOSABS 0.0 05/30/2014 0500    Hgb A1C No results found for this basename: HGBA1C         Assessment & Plan:   Right lower extremity pain and swelling:  Thrombophlebitis vs DVT Will obtain ultrasound STAT If you start having chest pain or SOB, go to ER immediately  RTC as needed or if symptoms persist or worsen

## 2014-07-18 NOTE — Progress Notes (Signed)
Pre visit review using our clinic review tool, if applicable. No additional management support is needed unless otherwise documented below in the visit note. 

## 2014-07-18 NOTE — Progress Notes (Signed)
*  Preliminary Results* Right lower extremity venous duplex completed. Right lower extremity is negative for deep vein thrombosis. There is no evidence of right Baker's cyst.  07/18/2014 6:12 PM  Gertie FeyMichelle Dimitri Dsouza, RVT, RDCS, RDMS

## 2014-07-18 NOTE — Telephone Encounter (Signed)
Form done and in my outbox.

## 2014-07-18 NOTE — Telephone Encounter (Signed)
FMLA paperwork faxed to office. Please review and sign then [facility]. Forms in your inbox.

## 2014-07-18 NOTE — Telephone Encounter (Signed)
Forms faxed and pt notified copy is ready for her to pick up. [facility]

## 2014-07-18 NOTE — Patient Instructions (Addendum)

## 2014-07-19 ENCOUNTER — Encounter: Payer: Self-pay | Admitting: Internal Medicine

## 2014-07-19 ENCOUNTER — Encounter: Payer: Self-pay | Admitting: Neurology

## 2014-07-19 DIAGNOSIS — Z1239 Encounter for other screening for malignant neoplasm of breast: Secondary | ICD-10-CM

## 2014-07-22 ENCOUNTER — Encounter: Payer: Self-pay | Admitting: Internal Medicine

## 2014-07-23 ENCOUNTER — Other Ambulatory Visit: Payer: Self-pay | Admitting: Internal Medicine

## 2014-07-23 DIAGNOSIS — F4321 Adjustment disorder with depressed mood: Secondary | ICD-10-CM

## 2014-07-25 ENCOUNTER — Other Ambulatory Visit: Payer: Self-pay | Admitting: Family Medicine

## 2014-07-25 ENCOUNTER — Other Ambulatory Visit: Payer: Self-pay | Admitting: Internal Medicine

## 2014-07-26 ENCOUNTER — Encounter: Payer: Self-pay | Admitting: Neurology

## 2014-07-26 NOTE — Telephone Encounter (Signed)
Last filled 06/25/14--please advise

## 2014-07-26 NOTE — Telephone Encounter (Signed)
Ok to phone in xanax 

## 2014-07-26 NOTE — Telephone Encounter (Signed)
Ok to phone on xanax

## 2014-07-27 ENCOUNTER — Encounter: Payer: Self-pay | Admitting: Internal Medicine

## 2014-07-27 ENCOUNTER — Encounter: Payer: Self-pay | Admitting: Neurology

## 2014-07-27 NOTE — Telephone Encounter (Signed)
Rx called in to pharmacy. 

## 2014-07-28 ENCOUNTER — Encounter: Payer: Self-pay | Admitting: Internal Medicine

## 2014-07-28 ENCOUNTER — Other Ambulatory Visit: Payer: Self-pay | Admitting: Internal Medicine

## 2014-07-30 MED ORDER — DIVALPROEX SODIUM 250 MG PO DR TAB: 750.0000 mg | DELAYED_RELEASE_TABLET | Freq: Three times a day (TID) | ORAL | Status: DC

## 2014-07-30 NOTE — Telephone Encounter (Signed)
i called in Xanax on Fri at 2:45 but pharmacy said they did not have it, this is not the first time i have called in a Rx to this pharmacy and they said they never received it--called in Rx and will print Depakote for you to sign so i can fax to pharmacy--

## 2014-08-06 ENCOUNTER — Encounter: Payer: Self-pay | Admitting: Internal Medicine

## 2014-08-06 ENCOUNTER — Other Ambulatory Visit: Payer: Self-pay | Admitting: Internal Medicine

## 2014-08-06 MED ORDER — PROMETHAZINE HCL 50 MG PO TABS: 25.0000 mg | ORAL_TABLET | Freq: Four times a day (QID) | ORAL | Status: DC | PRN

## 2014-08-06 NOTE — Telephone Encounter (Signed)
Work note sent to pt's employer [phone] per pt request

## 2014-08-08 ENCOUNTER — Encounter: Payer: Self-pay | Admitting: Internal Medicine

## 2014-08-09 ENCOUNTER — Other Ambulatory Visit: Payer: Self-pay | Admitting: Internal Medicine

## 2014-08-09 MED ORDER — HYDROCODONE-ACETAMINOPHEN 5-325 MG PO TABS: 1.0000 | ORAL_TABLET | Freq: Three times a day (TID) | ORAL | Status: DC | PRN

## 2014-08-15 ENCOUNTER — Other Ambulatory Visit: Payer: Self-pay | Admitting: Rheumatology

## 2014-08-15 LAB — URINALYSIS, COMPLETE
BILIRUBIN, UR: NEGATIVE
Glucose,UR: NEGATIVE mg/dL (ref 0–75)
KETONE: NEGATIVE
Nitrite: NEGATIVE
Ph: 5 (ref 4.5–8.0)
Protein: NEGATIVE
RBC,UR: 7 /HPF (ref 0–5)
Specific Gravity: 1.026 (ref 1.003–1.030)
WBC UR: 42 /HPF (ref 0–5)

## 2014-08-15 LAB — CBC WITH DIFFERENTIAL/PLATELET
BASOS ABS: 0.1 10*3/uL (ref 0.0–0.1)
BASOS PCT: 1 %
EOS PCT: 1.9 %
Eosinophil #: 0.1 10*3/uL (ref 0.0–0.7)
HCT: 37.8 % (ref 35.0–47.0)
HGB: 12.2 g/dL (ref 12.0–16.0)
Lymphocyte #: 2.1 10*3/uL (ref 1.0–3.6)
Lymphocyte %: 35.4 %
MCH: 31.1 pg (ref 26.0–34.0)
MCHC: 32.4 g/dL (ref 32.0–36.0)
MCV: 96 fL (ref 80–100)
MONOS PCT: 10 %
Monocyte #: 0.6 x10 3/mm (ref 0.2–0.9)
Neutrophil #: 3 10*3/uL (ref 1.4–6.5)
Neutrophil %: 51.7 %
PLATELETS: 206 10*3/uL (ref 150–440)
RBC: 3.94 10*6/uL (ref 3.80–5.20)
RDW: 15.5 % — ABNORMAL HIGH (ref 11.5–14.5)
WBC: 5.9 10*3/uL (ref 3.6–11.0)

## 2014-08-15 LAB — COMPREHENSIVE METABOLIC PANEL WITH GFR
Albumin: 4.3 g/dL
Alkaline Phosphatase: 97 U/L
Anion Gap: 5 — ABNORMAL LOW
BUN: 21 mg/dL — ABNORMAL HIGH
Bilirubin,Total: 0.2 mg/dL
Calcium, Total: 9 mg/dL
Chloride: 111 mmol/L — ABNORMAL HIGH
Co2: 24 mmol/L
Creatinine: 0.91 mg/dL
EGFR (African American): >60
EGFR (Non-African Amer.): >60
Glucose: 103 mg/dL — ABNORMAL HIGH
Osmolality: 283
Potassium: 4 mmol/L
SGOT(AST): 29 U/L
SGPT (ALT): 21 U/L
Sodium: 140 mmol/L
Total Protein: 7.8 g/dL

## 2014-08-23 ENCOUNTER — Ambulatory Visit: Payer: Federal, State, Local not specified - PPO | Admitting: Psychology

## 2014-08-27 ENCOUNTER — Encounter: Payer: Self-pay | Admitting: Internal Medicine

## 2014-08-28 ENCOUNTER — Other Ambulatory Visit: Payer: Self-pay

## 2014-08-28 ENCOUNTER — Encounter: Payer: Self-pay | Admitting: Internal Medicine

## 2014-08-28 ENCOUNTER — Other Ambulatory Visit: Payer: Self-pay | Admitting: Internal Medicine

## 2014-08-28 MED ORDER — ALPRAZOLAM 0.5 MG PO TABS: 0.5000 mg | ORAL_TABLET | Freq: Three times a day (TID) | ORAL | Status: DC | PRN

## 2014-08-29 ENCOUNTER — Other Ambulatory Visit: Payer: Self-pay | Admitting: Internal Medicine

## 2014-08-29 ENCOUNTER — Encounter: Payer: Self-pay | Admitting: Internal Medicine

## 2014-08-29 MED ORDER — PROMETHAZINE HCL 50 MG PO TABS: 25.0000 mg | ORAL_TABLET | Freq: Four times a day (QID) | ORAL | Status: DC | PRN

## 2014-08-29 MED ORDER — AMITRIPTYLINE HCL 25 MG PO TABS: 25.0000 mg | ORAL_TABLET | Freq: Every day | ORAL | Status: DC

## 2014-08-29 MED ORDER — GABAPENTIN 300 MG PO CAPS: 900.0000 mg | ORAL_CAPSULE | Freq: Three times a day (TID) | ORAL | Status: DC

## 2014-08-29 MED ORDER — ESTRADIOL 0.1 MG/GM VA CREA: 1.0000 | TOPICAL_CREAM | Freq: Every day | VAGINAL | Status: DC

## 2014-08-29 NOTE — Telephone Encounter (Signed)
Last filled 06/25/14 with 1 refill--please advise

## 2014-08-31 ENCOUNTER — Inpatient Hospital Stay (HOSPITAL_COMMUNITY)
Admission: EM | Admit: 2014-08-31 | Discharge: 2014-09-02 | DRG: 101 | Disposition: A | Discharge status: Home / Self Care | Payer: Federal, State, Local not specified - PPO | Attending: Critical Care Medicine | Admitting: Critical Care Medicine

## 2014-08-31 ENCOUNTER — Telehealth: Payer: Self-pay | Admitting: Family Medicine

## 2014-08-31 ENCOUNTER — Encounter: Payer: Self-pay | Admitting: Neurology

## 2014-08-31 ENCOUNTER — Telehealth: Payer: Self-pay

## 2014-08-31 ENCOUNTER — Emergency Department (HOSPITAL_COMMUNITY): Payer: Federal, State, Local not specified - PPO

## 2014-08-31 ENCOUNTER — Inpatient Hospital Stay (HOSPITAL_COMMUNITY): Payer: Federal, State, Local not specified - PPO

## 2014-08-31 DIAGNOSIS — Z887 Allergy status to serum and vaccine status: Secondary | ICD-10-CM

## 2014-08-31 DIAGNOSIS — Z765 Malingerer [conscious simulation]: Secondary | ICD-10-CM

## 2014-08-31 DIAGNOSIS — Z791 Long term (current) use of non-steroidal anti-inflammatories (NSAID): Secondary | ICD-10-CM

## 2014-08-31 DIAGNOSIS — E162 Hypoglycemia, unspecified: Secondary | ICD-10-CM | POA: Diagnosis present

## 2014-08-31 DIAGNOSIS — G40901 Epilepsy, unspecified, not intractable, with status epilepticus: Secondary | ICD-10-CM | POA: Diagnosis present

## 2014-08-31 DIAGNOSIS — Z833 Family history of diabetes mellitus: Secondary | ICD-10-CM | POA: Diagnosis not present

## 2014-08-31 DIAGNOSIS — T4275XA Adverse effect of unspecified antiepileptic and sedative-hypnotic drugs, initial encounter: Secondary | ICD-10-CM | POA: Diagnosis present

## 2014-08-31 DIAGNOSIS — Z888 Allergy status to other drugs, medicaments and biological substances status: Secondary | ICD-10-CM

## 2014-08-31 DIAGNOSIS — M352 Behcet's disease: Secondary | ICD-10-CM | POA: Diagnosis present

## 2014-08-31 DIAGNOSIS — G4700 Insomnia, unspecified: Secondary | ICD-10-CM

## 2014-08-31 DIAGNOSIS — Z91199 Patient's noncompliance with other medical treatment and regimen due to unspecified reason: Secondary | ICD-10-CM | POA: Diagnosis not present

## 2014-08-31 DIAGNOSIS — Z885 Allergy status to narcotic agent status: Secondary | ICD-10-CM | POA: Diagnosis not present

## 2014-08-31 DIAGNOSIS — J9600 Acute respiratory failure, unspecified whether with hypoxia or hypercapnia: Secondary | ICD-10-CM | POA: Diagnosis present

## 2014-08-31 DIAGNOSIS — F329 Major depressive disorder, single episode, unspecified: Secondary | ICD-10-CM | POA: Diagnosis present

## 2014-08-31 DIAGNOSIS — E785 Hyperlipidemia, unspecified: Secondary | ICD-10-CM | POA: Diagnosis present

## 2014-08-31 DIAGNOSIS — K219 Gastro-esophageal reflux disease without esophagitis: Secondary | ICD-10-CM | POA: Diagnosis present

## 2014-08-31 DIAGNOSIS — Z800 Family history of malignant neoplasm of digestive organs: Secondary | ICD-10-CM

## 2014-08-31 DIAGNOSIS — Z823 Family history of stroke: Secondary | ICD-10-CM | POA: Diagnosis not present

## 2014-08-31 DIAGNOSIS — E876 Hypokalemia: Secondary | ICD-10-CM | POA: Diagnosis not present

## 2014-08-31 DIAGNOSIS — Z803 Family history of malignant neoplasm of breast: Secondary | ICD-10-CM | POA: Diagnosis not present

## 2014-08-31 DIAGNOSIS — F411 Generalized anxiety disorder: Secondary | ICD-10-CM | POA: Diagnosis present

## 2014-08-31 DIAGNOSIS — R569 Unspecified convulsions: Secondary | ICD-10-CM | POA: Diagnosis present

## 2014-08-31 DIAGNOSIS — Z981 Arthrodesis status: Secondary | ICD-10-CM

## 2014-08-31 DIAGNOSIS — Z9119 Patient's noncompliance with other medical treatment and regimen: Secondary | ICD-10-CM

## 2014-08-31 DIAGNOSIS — I959 Hypotension, unspecified: Secondary | ICD-10-CM | POA: Diagnosis present

## 2014-08-31 DIAGNOSIS — J9601 Acute respiratory failure with hypoxia: Secondary | ICD-10-CM

## 2014-08-31 DIAGNOSIS — Z8249 Family history of ischemic heart disease and other diseases of the circulatory system: Secondary | ICD-10-CM

## 2014-08-31 DIAGNOSIS — F3289 Other specified depressive episodes: Secondary | ICD-10-CM | POA: Diagnosis present

## 2014-08-31 DIAGNOSIS — F331 Major depressive disorder, recurrent, moderate: Secondary | ICD-10-CM

## 2014-08-31 DIAGNOSIS — G40401 Other generalized epilepsy and epileptic syndromes, not intractable, with status epilepticus: Secondary | ICD-10-CM | POA: Diagnosis present

## 2014-08-31 LAB — DIFFERENTIAL
BASOS ABS: 0 10*3/uL (ref 0.0–0.1)
Basophils Relative: 1 % (ref 0–1)
EOS PCT: 4 % (ref 0–5)
Eosinophils Absolute: 0.2 10*3/uL (ref 0.0–0.7)
LYMPHS PCT: 30 % (ref 12–46)
Lymphs Abs: 1.3 10*3/uL (ref 0.7–4.0)
Monocytes Absolute: 0.3 10*3/uL (ref 0.1–1.0)
Monocytes Relative: 7 % (ref 3–12)
NEUTROS ABS: 2.4 10*3/uL (ref 1.7–7.7)
NEUTROS PCT: 58 % (ref 43–77)

## 2014-08-31 LAB — CBC
HCT: 36.3 % (ref 36.0–46.0)
Hemoglobin: 12.4 g/dL (ref 12.0–15.0)
MCH: 32 pg (ref 26.0–34.0)
MCHC: 34.2 g/dL (ref 30.0–36.0)
MCV: 93.8 fL (ref 78.0–100.0)
PLATELETS: 207 10*3/uL (ref 150–400)
RBC: 3.87 MIL/uL (ref 3.87–5.11)
RDW: 13.9 % (ref 11.5–15.5)
WBC: 4.4 10*3/uL (ref 4.0–10.5)

## 2014-08-31 LAB — GLUCOSE, CAPILLARY
GLUCOSE-CAPILLARY: 48 mg/dL — AB (ref 70–99)
Glucose-Capillary: 73 mg/dL (ref 70–99)

## 2014-08-31 LAB — MRSA PCR SCREENING: MRSA BY PCR: NEGATIVE

## 2014-08-31 LAB — RAPID URINE DRUG SCREEN, HOSP PERFORMED
Amphetamines: NOT DETECTED
BARBITURATES: NOT DETECTED
Benzodiazepines: POSITIVE — AB
COCAINE: NOT DETECTED
Opiates: NOT DETECTED
Tetrahydrocannabinol: NOT DETECTED

## 2014-08-31 LAB — I-STAT ARTERIAL BLOOD GAS, ED
ACID-BASE EXCESS: 1 mmol/L (ref 0.0–2.0)
Bicarbonate: 25.6 mEq/L — ABNORMAL HIGH (ref 20.0–24.0)
O2 Saturation: 100 %
Patient temperature: 98.7
TCO2: 27 mmol/L (ref 0–100)
pCO2 arterial: 38 mmHg (ref 35.0–45.0)
pH, Arterial: 7.437 (ref 7.350–7.450)
pO2, Arterial: 363 mmHg — ABNORMAL HIGH (ref 80.0–100.0)

## 2014-08-31 LAB — BASIC METABOLIC PANEL
Anion gap: 14 (ref 5–15)
BUN: 13 mg/dL (ref 6–23)
CO2: 25 mEq/L (ref 19–32)
Calcium: 8.9 mg/dL (ref 8.4–10.5)
Chloride: 103 mEq/L (ref 96–112)
Creatinine, Ser: 0.76 mg/dL (ref 0.50–1.10)
GLUCOSE: 83 mg/dL (ref 70–99)
POTASSIUM: 4.2 meq/L (ref 3.7–5.3)
Sodium: 142 mEq/L (ref 137–147)

## 2014-08-31 LAB — TRIGLYCERIDES: Triglycerides: 130 mg/dL (ref <150)

## 2014-08-31 LAB — URINALYSIS, ROUTINE W REFLEX MICROSCOPIC
BILIRUBIN URINE: NEGATIVE
Glucose, UA: NEGATIVE mg/dL
Hgb urine dipstick: NEGATIVE
Ketones, ur: 15 mg/dL — AB
Leukocytes, UA: NEGATIVE
NITRITE: NEGATIVE
PROTEIN: NEGATIVE mg/dL
Specific Gravity, Urine: 1.025 (ref 1.005–1.030)
UROBILINOGEN UA: 1 mg/dL (ref 0.0–1.0)
pH: 6 (ref 5.0–8.0)

## 2014-08-31 LAB — VALPROIC ACID LEVEL: Valproic Acid Lvl: 197.6 ug/mL (ref 50.0–100.0)

## 2014-08-31 MED ORDER — PROPOFOL 10 MG/ML IV EMUL
INTRAVENOUS | Status: AC
  Filled 2014-08-31: qty 100

## 2014-08-31 MED ORDER — PANTOPRAZOLE SODIUM 40 MG IV SOLR
40.0000 mg | INTRAVENOUS | Status: DC
  Administered 2014-08-31: 40 mg via INTRAVENOUS
  Filled 2014-08-31 (×2): qty 40

## 2014-08-31 MED ORDER — PROPOFOL 10 MG/ML IV EMUL
0.0000 ug/kg/min | INTRAVENOUS | Status: DC
  Administered 2014-08-31: 40 ug/kg/min via INTRAVENOUS
  Administered 2014-09-01: 30 ug/kg/min via INTRAVENOUS
  Filled 2014-08-31 (×3): qty 100

## 2014-08-31 MED ORDER — LORAZEPAM 2 MG/ML IJ SOLN
INTRAMUSCULAR | Status: AC
  Filled 2014-08-31: qty 1

## 2014-08-31 MED ORDER — SODIUM CHLORIDE 0.9 % IV SOLN
INTRAVENOUS | Status: DC
  Administered 2014-08-31 (×2): via INTRAVENOUS

## 2014-08-31 MED ORDER — CHLORHEXIDINE GLUCONATE 0.12 % MT SOLN
15.0000 mL | Freq: Two times a day (BID) | OROMUCOSAL | Status: DC
Start: 2014-08-31 — End: 2014-09-02
  Administered 2014-08-31 – 2014-09-02 (×3): 15 mL via OROMUCOSAL
  Filled 2014-08-31 (×3): qty 15

## 2014-08-31 MED ORDER — HEPARIN SODIUM (PORCINE) 5000 UNIT/ML IJ SOLN
5000.0000 [IU] | Freq: Three times a day (TID) | INTRAMUSCULAR | Status: DC
  Administered 2014-08-31 – 2014-09-01 (×4): 5000 [IU] via SUBCUTANEOUS
  Filled 2014-08-31 (×8): qty 1

## 2014-08-31 MED ORDER — PROPOFOL 10 MG/ML IV EMUL
5.0000 ug/kg/min | INTRAVENOUS | Status: DC
  Administered 2014-08-31: 50 ug/kg/min via INTRAVENOUS

## 2014-08-31 MED ORDER — FENTANYL CITRATE 0.05 MG/ML IJ SOLN: 25.0000 ug | INTRAMUSCULAR | Status: DC | PRN

## 2014-08-31 MED ORDER — DEXTROSE 50 % IV SOLN
INTRAVENOUS | Status: AC
Start: 2014-08-31 — End: 2014-08-31
  Filled 2014-08-31: qty 50

## 2014-08-31 MED ORDER — MIDAZOLAM HCL 5 MG/5ML IJ SOLN
5.0000 mg | Freq: Once | INTRAMUSCULAR | Status: AC
  Administered 2014-08-31: 5 mg via INTRAMUSCULAR

## 2014-08-31 MED ORDER — MIDAZOLAM HCL 2 MG/2ML IJ SOLN
INTRAMUSCULAR | Status: AC
  Filled 2014-08-31: qty 6

## 2014-08-31 MED ORDER — LORAZEPAM 2 MG/ML IJ SOLN
4.0000 mg | Freq: Once | INTRAMUSCULAR | Status: AC
  Administered 2014-08-31: 4 mg via INTRAVENOUS

## 2014-08-31 MED ORDER — FENTANYL BOLUS VIA INFUSION
50.0000 ug | INTRAVENOUS | Status: DC | PRN
  Filled 2014-08-31: qty 100

## 2014-08-31 MED ORDER — SODIUM CHLORIDE 0.9 % IV BOLUS (SEPSIS)
1000.0000 mL | Freq: Once | INTRAVENOUS | Status: AC
  Administered 2014-08-31: 1000 mL via INTRAVENOUS

## 2014-08-31 MED ORDER — INSULIN ASPART 100 UNIT/ML ~~LOC~~ SOLN: 2.0000 [IU] | SUBCUTANEOUS | Status: DC

## 2014-08-31 MED ORDER — DOCUSATE SODIUM 50 MG/5ML PO LIQD
100.0000 mg | Freq: Two times a day (BID) | ORAL | Status: DC | PRN
  Filled 2014-08-31: qty 10

## 2014-08-31 MED ORDER — DEXTROSE 50 % IV SOLN
25.0000 mL | Freq: Once | INTRAVENOUS | Status: AC | PRN
  Administered 2014-08-31: 25 mL via INTRAVENOUS

## 2014-08-31 MED ORDER — LORAZEPAM 2 MG/ML IJ SOLN
2.0000 mg | Freq: Once | INTRAMUSCULAR | Status: AC
  Administered 2014-08-31: 2 mg via INTRAVENOUS

## 2014-08-31 MED ORDER — PANTOPRAZOLE SODIUM 40 MG IV SOLR: 40.0000 mg | Freq: Every day | INTRAVENOUS | Status: DC

## 2014-08-31 MED ORDER — LORAZEPAM 2 MG/ML IJ SOLN
INTRAMUSCULAR | Status: AC
  Administered 2014-08-31: 4 mg
  Filled 2014-08-31: qty 1

## 2014-08-31 MED ORDER — LEVETIRACETAM IN NACL 1000 MG/100ML IV SOLN
1000.0000 mg | Freq: Once | INTRAVENOUS | Status: AC
  Administered 2014-08-31: 1000 mg via INTRAVENOUS
  Filled 2014-08-31: qty 100

## 2014-08-31 MED ORDER — FENTANYL CITRATE 0.05 MG/ML IJ SOLN: 100.0000 ug | INTRAMUSCULAR | Status: DC | PRN

## 2014-08-31 MED ORDER — PANTOPRAZOLE SODIUM 40 MG IV SOLR: 40.0000 mg | INTRAVENOUS | Status: DC

## 2014-08-31 MED ORDER — ETOMIDATE 2 MG/ML IV SOLN
30.0000 mg | Freq: Once | INTRAVENOUS | Status: AC
  Administered 2014-08-31: 30 mg via INTRAVENOUS

## 2014-08-31 MED ORDER — SUCCINYLCHOLINE CHLORIDE 20 MG/ML IJ SOLN
100.0000 mg | Freq: Once | INTRAMUSCULAR | Status: AC
  Administered 2014-08-31: 100 mg via INTRAVENOUS
  Filled 2014-08-31: qty 5

## 2014-08-31 MED ORDER — SODIUM CHLORIDE 0.9 % IV SOLN
Freq: Once | INTRAVENOUS | Status: AC
  Administered 2014-08-31: 19:00:00 via INTRAVENOUS

## 2014-08-31 MED ORDER — LORAZEPAM 2 MG/ML IJ SOLN
2.0000 mg | Freq: Once | INTRAMUSCULAR | Status: AC
  Administered 2014-08-31: 2 mg via INTRAVENOUS
  Filled 2014-08-31: qty 1

## 2014-08-31 MED ORDER — CETYLPYRIDINIUM CHLORIDE 0.05 % MT LIQD
7.0000 mL | Freq: Four times a day (QID) | OROMUCOSAL | Status: DC
  Administered 2014-08-31 – 2014-09-01 (×2): 7 mL via OROMUCOSAL

## 2014-08-31 MED ORDER — LORAZEPAM 2 MG/ML IJ SOLN
INTRAMUSCULAR | Status: AC
Start: 2014-08-31 — End: 2014-08-31
  Filled 2014-08-31: qty 2

## 2014-08-31 MED ORDER — MIDAZOLAM HCL 2 MG/2ML IJ SOLN: 2.0000 mg | INTRAMUSCULAR | Status: DC | PRN

## 2014-08-31 MED ORDER — SODIUM CHLORIDE 0.9 % IV SOLN
100.0000 mg | Freq: Two times a day (BID) | INTRAVENOUS | Status: DC
  Administered 2014-08-31 – 2014-09-02 (×4): 100 mg via INTRAVENOUS
  Filled 2014-08-31 (×7): qty 10

## 2014-08-31 NOTE — Consult Note (Signed)
Reason for Consult: For status epilepticus  HPI:                                                                                                                                          Brenda Mcguire is an 53 y.o. female with a history of Behcet's disease and seizure disorder, as well as pseudoseizures who presented to the emergency room with multiple intractable seizures. Patient routinely takes Depakote 750 mg 3 times a day, Neurontin 900 mg 3 times a day, Vimpat 100 mg twice a day and Topamax 25 mg daily. Depakote level is markedly elevated at 197.6. In the emergency room she was given 1 g of Keppra IV as well as multiple doses of benzodiazepines. Patient was ultimately intubated for airway protection and was started on propofol for sedation and seizure management.  Past Medical History  Diagnosis Date  . GERD (gastroesophageal reflux disease)   . Hyperlipidemia   . History of blood transfusion   . Chronic headaches   . Phlebitis   . History of colonic polyps   . 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 Tonic clonic seizures   . Anxiety     "situational stress"    Past Surgical History  Procedure Laterality Date  . Cholecystectomy    . Tonsillectomy  1970  . Appendectomy  1975  . Lumbar fusion  1999    L5-S1  . Back surgery    . Abdominal hysterectomy  1988  . Portacath placement Left 2008  . Stone extraction with basket      Family History  Problem Relation Age of Onset  . Diabetes Mother   . Hyperlipidemia Mother   . Stroke Mother   . Hypertension Mother   . Colon cancer Mother   . Breast cancer Mother   . Early death Father   . Heart attack Father   . Heart disease Father   . Hyperlipidemia Father   . COPD Sister     Social History:  reports that she has never smoked. She has never used smokeless tobacco. She reports that she does not drink alcohol or use illicit drugs.  Allergies  Allergen Reactions  . Hepatitis B Vaccine Hives and Shortness Of Breath     Causes wheezing   . Imitrex [Sumatriptan] Other (See Comments)    Makes her have SVT and chest pain  . Iohexol Anaphylaxis, Hives and Shortness Of Breath  . Codeine Itching and Rash  . Keppra Xr [Levetiracetam] Nausea Only    MEDICATIONS:  I have reviewed the patient's current medications.   ROS:                                                                                                                                       History obtained from unobtainable from patient due to mental status  Blood pressure 108/70, pulse 64, temperature 97.5 F (36.4 C), temperature source Oral, resp. rate 14, height 6' (1.829 m), weight 83.462 kg (184 lb), SpO2 100.00%.   Neurologic Examination:                                                                                                      Patient is intubated and on mechanical ventilation at the time of this evaluation. She was unresponsive except for minimal withdrawal movements to noxious stimuli. Pupils were equal and reacted normally to light. Extraocular movements were intact but sluggish oculocephalic maneuvers. The facial weakness was noted. Muscle tone was flaccid throughout. No abnormal posturing is noted. No seizure activity was noted, as well. Deep tendon reflexes were 1+ and symmetrical. Plantar responses were flexor bilaterally.  Lab Results  Component Value Date/Time   CHOL 234* 03/02/2013 11:15 AM    Results for orders placed during the hospital encounter of 08/31/14 (from the past 48 hour(s))  VALPROIC ACID LEVEL     Status: Abnormal   Collection Time    08/31/14  4:29 PM      Result Value Ref Range   Valproic Acid Lvl 197.6 (*) 50.0 - 100.0 ug/mL   Comment: RESULTS CONFIRMED BY MANUAL DILUTION     CRITICAL RESULT CALLED TO, READ BACK BY AND VERIFIED WITH:     S SYNDER,RN 1818 08/31/14 [facility]  BASIC  METABOLIC PANEL     Status: None   Collection Time    08/31/14  4:29 PM      Result Value Ref Range   Sodium 142  137 - 147 mEq/L   Potassium 4.2  3.7 - 5.3 mEq/L   Chloride 103  96 - 112 mEq/L   CO2 25  19 - 32 mEq/L   Glucose, Bld 83  70 - 99 mg/dL   BUN 13  6 - 23 mg/dL   Creatinine, Ser 0.76  0.50 - 1.10 mg/dL   Calcium 8.9  8.4 - 10.5 mg/dL   GFR calc non Af Amer >90  >90 mL/min   GFR calc Af Amer >90  >90 mL/min   Comment: (NOTE)     The  eGFR has been calculated using the CKD EPI equation.     This calculation has not been validated in all clinical situations.     eGFR's persistently <90 mL/min signify possible Chronic Kidney     Disease.   Anion gap 14  5 - 15  CBC     Status: None   Collection Time    08/31/14  4:29 PM      Result Value Ref Range   WBC 4.4  4.0 - 10.5 K/uL   RBC 3.87  3.87 - 5.11 MIL/uL   Hemoglobin 12.4  12.0 - 15.0 g/dL   HCT 36.3  36.0 - 46.0 %   MCV 93.8  78.0 - 100.0 fL   MCH 32.0  26.0 - 34.0 pg   MCHC 34.2  30.0 - 36.0 g/dL   RDW 13.9  11.5 - 15.5 %   Platelets 207  150 - 400 K/uL  DIFFERENTIAL     Status: None   Collection Time    08/31/14  4:29 PM      Result Value Ref Range   Neutrophils Relative % 58  43 - 77 %   Neutro Abs 2.4  1.7 - 7.7 K/uL   Lymphocytes Relative 30  12 - 46 %   Lymphs Abs 1.3  0.7 - 4.0 K/uL   Monocytes Relative 7  3 - 12 %   Monocytes Absolute 0.3  0.1 - 1.0 K/uL   Eosinophils Relative 4  0 - 5 %   Eosinophils Absolute 0.2  0.0 - 0.7 K/uL   Basophils Relative 1  0 - 1 %   Basophils Absolute 0.0  0.0 - 0.1 K/uL  URINALYSIS, ROUTINE W REFLEX MICROSCOPIC     Status: Abnormal   Collection Time    08/31/14  5:10 PM      Result Value Ref Range   Color, Urine YELLOW  YELLOW   APPearance CLEAR  CLEAR   Specific Gravity, Urine 1.025  1.005 - 1.030   pH 6.0  5.0 - 8.0   Glucose, UA NEGATIVE  NEGATIVE mg/dL   Hgb urine dipstick NEGATIVE  NEGATIVE   Bilirubin Urine NEGATIVE  NEGATIVE   Ketones, ur 15 (*) NEGATIVE  mg/dL   Protein, ur NEGATIVE  NEGATIVE mg/dL   Urobilinogen, UA 1.0  0.0 - 1.0 mg/dL   Nitrite NEGATIVE  NEGATIVE   Leukocytes, UA NEGATIVE  NEGATIVE   Comment: MICROSCOPIC NOT DONE ON URINES WITH NEGATIVE PROTEIN, BLOOD, LEUKOCYTES, NITRITE, OR GLUCOSE <1000 mg/dL.  URINE RAPID DRUG SCREEN (HOSP PERFORMED)     Status: Abnormal   Collection Time    08/31/14  5:10 PM      Result Value Ref Range   Opiates NONE DETECTED  NONE DETECTED   Cocaine NONE DETECTED  NONE DETECTED   Benzodiazepines POSITIVE (*) NONE DETECTED   Amphetamines NONE DETECTED  NONE DETECTED   Tetrahydrocannabinol NONE DETECTED  NONE DETECTED   Barbiturates NONE DETECTED  NONE DETECTED   Comment:            DRUG SCREEN FOR MEDICAL PURPOSES     ONLY.  IF CONFIRMATION IS NEEDED     FOR ANY PURPOSE, NOTIFY LAB     WITHIN 5 DAYS.                LOWEST DETECTABLE LIMITS     FOR URINE DRUG SCREEN     Drug Class       Cutoff (ng/mL)  Amphetamine      1000     Barbiturate      200     Benzodiazepine   416     Tricyclics       384     Opiates          300     Cocaine          300     THC              50  I-STAT ARTERIAL BLOOD GAS, ED     Status: Abnormal   Collection Time    08/31/14  6:46 PM      Result Value Ref Range   pH, Arterial 7.437  7.350 - 7.450   pCO2 arterial 38.0  35.0 - 45.0 mmHg   pO2, Arterial 363.0 (*) 80.0 - 100.0 mmHg   Bicarbonate 25.6 (*) 20.0 - 24.0 mEq/L   TCO2 27  0 - 100 mmol/L   O2 Saturation 100.0     Acid-Base Excess 1.0  0.0 - 2.0 mmol/L   Patient temperature 98.7 F     Collection site RADIAL, ALLEN'S TEST ACCEPTABLE     Drawn by Operator     Sample type ARTERIAL    GLUCOSE, CAPILLARY     Status: None   Collection Time    08/31/14  8:27 PM      Result Value Ref Range   Glucose-Capillary 73  70 - 99 mg/dL    Ct Head Wo Contrast  08/31/2014   CLINICAL DATA:  Found unresponsive.  Hematoma the forehead.  EXAM: CT HEAD WITHOUT CONTRAST  CT CERVICAL SPINE WITHOUT CONTRAST   TECHNIQUE: Multidetector CT imaging of the head and cervical spine was performed following the standard protocol without intravenous contrast. Multiplanar CT image reconstructions of the cervical spine were also generated.  COMPARISON:  06/23/2014  FINDINGS: CT HEAD FINDINGS  No acute intracranial abnormality. Specifically, no hemorrhage, hydrocephalus, mass lesion, acute infarction, or significant intracranial injury. No acute calvarial abnormality. Visualized paranasal sinuses and mastoids clear. Orbital soft tissues unremarkable.  CT CERVICAL SPINE FINDINGS  Alignment is normal. 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 Prevertebral soft tissues are normal. No fracture. No epidural or paraspinal hematoma.  C2 is rotated relative to C1, likely positional.  IMPRESSION: No acute intracranial abnormality.  No acute bony abnormality within the cervical spine.   Electronically Signed   By: Rolm Baptise M.D.   On: 08/31/2014 18:36   Ct Cervical Spine Wo Contrast  08/31/2014   CLINICAL DATA:  Found unresponsive.  Hematoma the forehead.  EXAM: CT HEAD WITHOUT CONTRAST  CT CERVICAL SPINE WITHOUT CONTRAST  TECHNIQUE: Multidetector CT imaging of the head and cervical spine was performed following the standard protocol without intravenous contrast. Multiplanar CT image reconstructions of the cervical spine were also generated.  COMPARISON:  06/23/2014  FINDINGS: CT HEAD FINDINGS  No acute intracranial abnormality. Specifically, no hemorrhage, hydrocephalus, mass lesion, acute infarction, or significant intracranial injury. No acute calvarial abnormality. Visualized paranasal sinuses and mastoids clear. Orbital soft tissues unremarkable.  CT CERVICAL SPINE FINDINGS  Alignment is normal. 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 Prevertebral soft tissues are normal. No fracture. No epidural or paraspinal hematoma.  C2 is rotated relative to C1, likely positional.  IMPRESSION: No  acute intracranial abnormality.  No acute bony abnormality within the cervical spine.   Electronically Signed   By: Rolm Baptise M.D.   On: 08/31/2014 18:36  Dg Chest Portable 1 View  08/31/2014   CLINICAL DATA:  Post intubation, seizures  EXAM: PORTABLE CHEST - 1 VIEW  COMPARISON:  05/30/2014  FINDINGS: Lungs are clear. No focal consolidation. No pleural effusion or pneumothorax.  The heart is normal in size.  Endotracheal tube terminates 3.5 cm above the carina.  Left chest port terminates at the cavoatrial junction.  Enteric tube courses into the stomach.  IMPRESSION: Endotracheal tube terminates 3.5 cm above the carina.  Additional support apparatus as above.  No evidence of acute cardiopulmonary disease.   Electronically Signed   By: Julian Hy M.D.   On: 08/31/2014 18:14    Assessment/Plan: 53 year old lady with history of seizure disorder as well as documented pseudoseizures presenting with status epilepticus, requiring intubation for airway protection and use of propofol for seizure management, in addition to conventional antiepileptic drugs.  Recommendations: 1. Continue propofol as needed for sedation as well as as seizure control. 2. Continue valproic acid at reduced dose of 500 mg every 8 hours. May be given IV as Depacon. 3. Continue Vimpat at 100 mg every 12 hours 4. Versed as needed for breakthrough seizures, 2 mg every 2 hours. 5. We'll obtain routine EEG study in the a.m.  We will continue to follow this patient with you.   C.R. Nicole Kindred, MD [facility] [phone]  08/31/2014, 8:44 PM

## 2014-08-31 NOTE — Progress Notes (Signed)
Patient intubated per MD for airway protection due to actively seizing.  Easy intubation.  Positive color change.  Bilateral breath sounds noted.  Chest xray pending for tube placement.  Will obtain ABG.  RT will continue to monitor.

## 2014-08-31 NOTE — ED Notes (Signed)
Held remainder dose of ativan give at [employer]

## 2014-08-31 NOTE — Progress Notes (Signed)
eLink Physician-Brief Progress Note Patient Name: Brenda Mcguire DOB: 29-Jan-1961 MRN: [id number]   Date of Service  08/31/2014  HPI/Events of Note  eMD patient assessment  eICU Interventions  Reviewed labs, history, camera'ed in on pt. Pt stable, intubated.         Sandi Carne K. 08/31/2014, 8:34 PM

## 2014-08-31 NOTE — Progress Notes (Signed)
Chaplain responded to page, met with pt's husband in the hall who was talking with the police.  Chaplain escorted husband to consult room and returned to check on pt and inquire as to the situation with police.  Police had completed investigation and were leaving.  Pt was asking about husband, chaplain informed pt that husband was safe in a nearby waiting area and would be back in as soon as possible.  Pt thanked chaplain for taking care of husband.  Chaplain offered hospitality and nourishment to husband as well as emotional and spiritual care to both pt and husband.  On call chaplain took over and will continue to follow up as needed.   08/31/14 1615  Clinical Encounter Type  Visited With Patient;Family;Health care provider  Visit Type Initial;Spiritual support;Social support;Critical Care;ED  Referral From Nurse;Care management  Spiritual Encounters  Spiritual Needs Emotional  Stress Factors  Patient Stress Factors Exhausted;Family relationships;Health changes;Loss of control;Major life changes  Family Stress Factors Exhausted;Family relationships;Health changes;Loss of control;Major life changes   Mertie Moores, Chaplain

## 2014-08-31 NOTE — ED Notes (Signed)
Dr Zavitz at bedside  

## 2014-08-31 NOTE — ED Provider Notes (Signed)
CSN: [id number]     Arrival date & time 08/31/14  1611 History   First MD Initiated Contact with Patient 08/31/14 1619     No chief complaint on file.    (Consider location/radiation/quality/duration/timing/severity/associated sxs/prior Treatment) HPI Comments: Bryanda Mikel 53 y.o. With a pmh of seizures presents to the ED with EMS after being found down at home with seizure like activity. The patient's husband reports hearing a "thud" and found her on the ground. She reportedly had seizure like activity. On arrival by EMS she was not responsive, but VS were wnls. En route, she had generalized tonic clonic shaking movements for 5 minutes. She was given 5 mg IM Versed with resolution of the seizure like activity in 1 minute. History is limited due to the mental status of the patient. On Arrival the patient is unresponsive, but localizing to pain. She is slightly tachycardic, but normotensive. O2 sats wnls on room air. IV access was difficult, and she developed another seizure with generalized tonic clonic shaking, her pupils were dilated at this time. This resolved shortly after receiving the versed. She then began speaking, but she was confused. She reports last remembering "I was walking to the kitchen to get something to drink and I was feeling a little lightheaded". She reports being compliant with her medications at home. IV access was established with her PORT and a PIV. She developed another seizure, which resolved just prior to giving 2 mg ativan, which was still given. She developed another seizure lasting for 3 minutes that resolved with 4 mg IV ativan. Loaded with keppra.    Past Medical History  Diagnosis Date  . GERD (gastroesophageal reflux disease)   . Hyperlipidemia   . History of blood transfusion   . Chronic headaches   . Phlebitis   . History of colonic polyps   . 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 Tonic clonic seizures   . Anxiety     "situational stress"   Past  Surgical History  Procedure Laterality Date  . Cholecystectomy    . Tonsillectomy  1970  . Appendectomy  1975  . Lumbar fusion  1999    L5-S1  . Back surgery    . Abdominal hysterectomy  1988  . Portacath placement Left 2008  . Stone extraction with basket     Family History  Problem Relation Age of Onset  . Diabetes Mother   . Hyperlipidemia Mother   . Stroke Mother   . Hypertension Mother   . Colon cancer Mother   . Breast cancer Mother   . Early death Father   . Heart attack Father   . Heart disease Father   . Hyperlipidemia Father   . COPD Sister    History  Substance Use Topics  . Smoking status: Never Smoker   . Smokeless tobacco: Never Used  . Alcohol Use: No   OB History   Grav Para Term Preterm Abortions TAB SAB Ect Mult Living                 Review of Systems  Unable to perform ROS: Acuity of condition      Allergies  Hepatitis b vaccine; Imitrex; Iohexol; Codeine; and Keppra xr  Home Medications   Prior to Admission medications   Medication Sig Start Date End Date Taking? Authorizing Provider  ALPRAZolam Prudy Feeler) 0.5 MG tablet Take 1 tablet (0.5 mg total) by mouth 3 (three) times daily as needed for anxiety. 08/28/14  Yes  Lorre Munroe, NP  cyclophosphamide (CYTOXAN) 25 MG tablet Take 75 mg by mouth daily. Give on an empty stomach 1 hour before or 2 hours after meals.   Yes Historical Provider, MD  diazepam (VALIUM) 5 MG tablet Take 5 mg by mouth every 6 (six) hours as needed for anxiety.   Yes Historical Provider, MD  divalproex (DEPAKOTE) 250 MG DR tablet Take 3 tablets (750 mg total) by mouth 3 (three) times daily. 07/30/14  Yes Lorre Munroe, NP  gabapentin (NEURONTIN) 300 MG capsule Take 3 capsules (900 mg total) by mouth 3 (three) times daily. 08/29/14  Yes Lorre Munroe, NP  HYDROcodone-acetaminophen (NORCO/VICODIN) 5-325 MG per tablet Take 1 tablet by mouth 3 (three) times daily as needed for moderate pain. 08/09/14  Yes Lorre Munroe, NP   Lacosamide (VIMPAT) 100 MG TABS Take 1 tablet (100 mg total) by mouth 2 (two) times daily. 06/01/14  Yes Calvert Cantor, MD  promethazine (PHENERGAN) 50 MG tablet Take 0.5 tablets (25 mg total) by mouth every 6 (six) hours as needed for nausea or vomiting. 08/29/14  Yes Lorre Munroe, NP  topiramate (TOPAMAX) 25 MG tablet Take 25 mg by mouth daily.   Yes Historical Provider, MD   BP 99/74  Pulse 86  Temp(Src) 98.7 F (37.1 C) (Oral)  Resp 14  Ht 6' (1.829 m)  Wt 184 lb (83.462 kg)  BMI 24.95 kg/m2  SpO2 100% Physical Exam  Constitutional: She appears well-developed and well-nourished. She appears distressed. Cervical collar in place.  HENT:  Head: Normocephalic. Head is with abrasion (small on the right forehead) and with contusion (small, right forehead). Head is without laceration, without right periorbital erythema and without left periorbital erythema.    Eyes: Right eye exhibits no discharge. Left eye exhibits no discharge. Right conjunctiva is injected. Left conjunctiva is injected.  Dilated bilaterally with seizures When postictal - EOM intact, pupils 3 mm and reactive bilaterally  Neck: No tracheal deviation present.  Cardiovascular: Regular rhythm, S1 normal and S2 normal.  Tachycardia present.   Pulses:      Radial pulses are 2+ on the right side, and 2+ on the left side.  Pulmonary/Chest: No stridor. Not tachypneic. She has no decreased breath sounds. She has no wheezes. She has no rhonchi. She has no rales.  Abdominal: She exhibits no distension. There is no tenderness. There is no rigidity, no guarding, no tenderness at McBurney's point and negative Murphy's sign.  Musculoskeletal:       Right lower leg: She exhibits no edema.       Left lower leg: She exhibits no edema.  Lymphadenopathy:    She has no cervical adenopathy.  Neurological: She is unresponsive.  On arrival GCS was 8, and improved to 14, but again declined    ED Course  INTUBATION Date/Time: 08/31/2014  6:16 PM Performed by: Sena Hitch Authorized by: Sena Hitch Consent: The procedure was performed in an emergent situation. Indications: airway protection Intubation method: video-assisted Patient status: paralyzed (RSI) Preoxygenation: nonrebreather mask Pretreatment medications: none Sedatives: etomidate Paralytic: succinylcholine Laryngoscope size: Glidescope 3. Tube size: 6.5 mm Tube type: cuffed Number of attempts: 2 (7.5 tube was too large and could pass through the cords) Ventilation between attempts: BVM Cricoid pressure: no Cords visualized: yes Post-procedure assessment: chest rise and ETCO2 monitor Breath sounds: equal Cuff inflated: yes ETT to lip: 21 cm ETT to teeth: 20 cm Tube secured with: ETT holder Chest x-ray interpreted by me, other physician  and radiologist. Chest x-ray findings: endotracheal tube in appropriate position Patient tolerance: Patient tolerated the procedure well with no immediate complications.   (including critical care time) Labs Review Labs Reviewed  VALPROIC ACID LEVEL - Abnormal; Notable for the following:    Valproic Acid Lvl 197.6 (*)    All other components within normal limits  URINALYSIS, ROUTINE W REFLEX MICROSCOPIC - Abnormal; Notable for the following:    Ketones, ur 15 (*)    All other components within normal limits  URINE RAPID DRUG SCREEN (HOSP PERFORMED) - Abnormal; Notable for the following:    Benzodiazepines POSITIVE (*)    All other components within normal limits  I-STAT ARTERIAL BLOOD GAS, ED - Abnormal; Notable for the following:    pO2, Arterial 363.0 (*)    Bicarbonate 25.6 (*)    All other components within normal limits  BASIC METABOLIC PANEL  CBC  DIFFERENTIAL  TOPIRAMATE LEVEL  BLOOD GAS, ARTERIAL    Imaging Review Ct Head Wo Contrast  08/31/2014   CLINICAL DATA:  Found unresponsive.  Hematoma the forehead.  EXAM: CT HEAD WITHOUT CONTRAST  CT CERVICAL SPINE WITHOUT CONTRAST  TECHNIQUE:  Multidetector CT imaging of the head and cervical spine was performed following the standard protocol without intravenous contrast. Multiplanar CT image reconstructions of the cervical spine were also generated.  COMPARISON:  06/23/2014  FINDINGS: CT HEAD FINDINGS  No acute intracranial abnormality. Specifically, no hemorrhage, hydrocephalus, mass lesion, acute infarction, or significant intracranial injury. No acute calvarial abnormality. Visualized paranasal sinuses and mastoids clear. Orbital soft tissues unremarkable.  CT CERVICAL SPINE FINDINGS  Alignment is normal. 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 Prevertebral soft tissues are normal. No fracture. No epidural or paraspinal hematoma.  C2 is rotated relative to C1, likely positional.  IMPRESSION: No acute intracranial abnormality.  No acute bony abnormality within the cervical spine.   Electronically Signed   By: Charlett Nose M.D.   On: 08/31/2014 18:36   Ct Cervical Spine Wo Contrast  08/31/2014   CLINICAL DATA:  Found unresponsive.  Hematoma the forehead.  EXAM: CT HEAD WITHOUT CONTRAST  CT CERVICAL SPINE WITHOUT CONTRAST  TECHNIQUE: Multidetector CT imaging of the head and cervical spine was performed following the standard protocol without intravenous contrast. Multiplanar CT image reconstructions of the cervical spine were also generated.  COMPARISON:  06/23/2014  FINDINGS: CT HEAD FINDINGS  No acute intracranial abnormality. Specifically, no hemorrhage, hydrocephalus, mass lesion, acute infarction, or significant intracranial injury. No acute calvarial abnormality. Visualized paranasal sinuses and mastoids clear. Orbital soft tissues unremarkable.  CT CERVICAL SPINE FINDINGS  Alignment is normal. 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 Prevertebral soft tissues are normal. No fracture. No epidural or paraspinal hematoma.  C2 is rotated relative to C1, likely positional.  IMPRESSION: No acute  intracranial abnormality.  No acute bony abnormality within the cervical spine.   Electronically Signed   By: Charlett Nose M.D.   On: 08/31/2014 18:36   Dg Chest Portable 1 View  08/31/2014   CLINICAL DATA:  Post intubation, seizures  EXAM: PORTABLE CHEST - 1 VIEW  COMPARISON:  05/30/2014  FINDINGS: Lungs are clear. No focal consolidation. No pleural effusion or pneumothorax.  The heart is normal in size.  Endotracheal tube terminates 3.5 cm above the carina.  Left chest port terminates at the cavoatrial junction.  Enteric tube courses into the stomach.  IMPRESSION: Endotracheal tube terminates 3.5 cm above the carina.  Additional support apparatus as above.  No evidence of acute cardiopulmonary disease.   Electronically Signed   By: Charline Bills M.D.   On: 08/31/2014 18:14     EKG Interpretation   Date/Time:  Friday August 31 2014 16:39:07 EDT Ventricular Rate:  104 PR Interval:  121 QRS Duration: 83 QT Interval:  357 QTC Calculation: 470 R Axis:   -25 Text Interpretation:  Sinus tachycardia Borderline left axis deviation  Abnormal R-wave progression, early transition Confirmed by ZAVITZ  MD,  JOSHUA ([id number]) on 08/31/2014 4:45:26 PM      MDM   Final diagnoses:  Status epilepticus    Pt presents in status epilepticus. Tried multiple benzos (versed and ativan) with either transient or no effect. Loaded with Keppra. Neuro consulted (Dr. Roseanne Reno) and rec giving Vimpat and starting propofol if we intubate. Patient continue to seizure, and she was intubated for airway protection. Seizing continued intermittently. Doubt infectious source at this time. Admitted to the ICU. CT head neg for AICA. Care discussed with my attending, Dr. Jodi Mourning.     Sena Hitch, MD 08/31/14 1610  Sena Hitch, MD 08/31/14 [phone]

## 2014-08-31 NOTE — ED Notes (Signed)
Critical care at bedside  

## 2014-08-31 NOTE — Telephone Encounter (Signed)
Message copied by Franciso Bend on Fri Aug 31, 2014 11:21 AM ------      Message from: Van Clines      Created: Fri Aug 31, 2014 11:06 AM      Regarding: pls call       Patient sent message on MyChart that she is feeling weird this morning, similar to prior to seizure. Can you pls call her and have her take an additional dose of Depakote  x 1 now. Pls also let her know that for these types of issues, she should call and not email because we do not access emails as quickly. Thanks ------

## 2014-08-31 NOTE — Telephone Encounter (Signed)
Mr Mcnicholas said 2 hours ago pt changed drastically; pt is cursing,confused, irrational and talking out of head. Pt has not threatened her husband and he does not think SI/HI. Pt told husband she talked with Nicki Reaper NP 2 hours ago. Mr Gemmer wants to know what to do for pt; Nicki Reaper NP said she did not speak with pt and pt should go to ED for eval. Mr Pryce said pt will not go to ED either by him taking her or by ambulance. Rene Kocher advised to call police if necessary. Mr Reimann voiced understanding.

## 2014-08-31 NOTE — ED Notes (Signed)
   Husbands number [phone]

## 2014-08-31 NOTE — ED Notes (Signed)
Patient being bagged, first attempt unsuccessful

## 2014-08-31 NOTE — ED Notes (Signed)
Dr. Ophelia Charter at bedside

## 2014-08-31 NOTE — ED Notes (Addendum)
Patient has seizure at 1639, still seizing now.  Giving  of ativan per doc order

## 2014-08-31 NOTE — ED Notes (Signed)
RSI: Dr. Malen Gauze at bedside, Gearldine Bienenstock RT at bedside, Myrtha Mantis, RN at bedside, Amy, EMT at bedside, Angelique Holm, RN at bedside

## 2014-08-31 NOTE — ED Notes (Signed)
Patient seizing, ativan given per Dr. Ophelia Charter instructions

## 2014-08-31 NOTE — ED Notes (Signed)
6.5 21 at the lip, positive color change, bilateral breath, portable chest called.

## 2014-08-31 NOTE — ED Notes (Signed)
Dr. Delford Field on tele-neuro video conference

## 2014-08-31 NOTE — Progress Notes (Signed)
Patient transported to CT without any complications.  

## 2014-08-31 NOTE — ED Notes (Signed)
Pharmacy called for keppra

## 2014-08-31 NOTE — H&P (Addendum)
PULMONARY / CRITICAL CARE MEDICINE   Name: Anjali Manzella MRN: [id number] DOB: [birth year]    ADMISSION DATE:  08/31/2014  REFERRING MD :  ER  CHIEF COMPLAINT:  Feels weird  INITIAL PRESENTATION:  53 yo female with status epilepticus.  Intubated for airway protection.  PCCM asked to admit to ICU.  She has hx of Behcet's disease.  She has several prior admissions for status epilepticus and intubation.  STUDIES:  9/18 CT head >> no acute findings 9/18 CT neck >> no acute findings  SIGNIFICANT EVENTS: 9/18 Admit, neurology consulted  HISTORY OF PRESENT ILLNESS:   Pt call her neurologist office saying she was feeling weird and worried she was going to have seizure.  She was advised to take additional depakote on 9/16.  On 9/18 she was noted to be confused, irritational, and combative.  Husband was advised to take pt to ER.  Prior to this she had syncopal event, and then seizure for about 3 to 5 minutes.  She was noted to have recurrent seizure in ER.  She was given keppra and ativan.  She was intubated.  Hx of obtained from husband and review of medical records.  PAST MEDICAL HISTORY :  Past Medical History  Diagnosis Date  . GERD (gastroesophageal reflux disease)   . Hyperlipidemia   . History of blood transfusion   . Chronic headaches   . Phlebitis   . History of colonic polyps   . 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 Tonic clonic seizures   . Anxiety     "situational stress"   Past Surgical History  Procedure Laterality Date  . Cholecystectomy    . Tonsillectomy  1970  . Appendectomy  1975  . Lumbar fusion  1999    L5-S1  . Back surgery    . Abdominal hysterectomy  1988  . Portacath placement Left 2008  . Stone extraction with basket     Prior to Admission medications   Medication Sig Start Date End Date Taking? Authorizing Provider  ALPRAZolam Prudy Feeler) 0.5 MG tablet Take 1 tablet (0.5 mg total) by mouth 3 (three) times daily as needed for anxiety. 08/28/14  Yes  Lorre Munroe, NP  cyclophosphamide (CYTOXAN) 25 MG tablet Take 75 mg by mouth daily. Give on an empty stomach 1 hour before or 2 hours after meals.   Yes Historical Provider, MD  diazepam (VALIUM) 5 MG tablet Take 5 mg by mouth every 6 (six) hours as needed for anxiety.   Yes Historical Provider, MD  divalproex (DEPAKOTE) 250 MG DR tablet Take 3 tablets (750 mg total) by mouth 3 (three) times daily. 07/30/14  Yes Lorre Munroe, NP  gabapentin (NEURONTIN) 300 MG capsule Take 3 capsules (900 mg total) by mouth 3 (three) times daily. 08/29/14  Yes Lorre Munroe, NP  HYDROcodone-acetaminophen (NORCO/VICODIN) 5-325 MG per tablet Take 1 tablet by mouth 3 (three) times daily as needed for moderate pain. 08/09/14  Yes Lorre Munroe, NP  Lacosamide (VIMPAT) 100 MG TABS Take 1 tablet (100 mg total) by mouth 2 (two) times daily. 06/01/14  Yes Calvert Cantor, MD  promethazine (PHENERGAN) 50 MG tablet Take 0.5 tablets (25 mg total) by mouth every 6 (six) hours as needed for nausea or vomiting. 08/29/14  Yes Lorre Munroe, NP  topiramate (TOPAMAX) 25 MG tablet Take 25 mg by mouth daily.   Yes Historical Provider, MD   Allergies  Allergen Reactions  . Hepatitis B Vaccine Hives  and Shortness Of Breath    Causes wheezing   . Imitrex [Sumatriptan] Other (See Comments)    Makes her have SVT and chest pain  . Iohexol Anaphylaxis, Hives and Shortness Of Breath  . Codeine Itching and Rash  . Keppra Xr [Levetiracetam] Nausea Only    FAMILY HISTORY:  Family History  Problem Relation Age of Onset  . Diabetes Mother   . Hyperlipidemia Mother   . Stroke Mother   . Hypertension Mother   . Colon cancer Mother   . Breast cancer Mother   . Early death Father   . Heart attack Father   . Heart disease Father   . Hyperlipidemia Father   . COPD Sister    SOCIAL HISTORY:  reports that she has never smoked. She has never used smokeless tobacco. She reports that she does not drink alcohol or use illicit  drugs.  REVIEW OF SYSTEMS:   Unable to obtain  SUBJECTIVE:   VITAL SIGNS: Temp:  [98.7 F (37.1 C)] 98.7 F (37.1 C) (09/18 1642) Pulse Rate:  [83-110] 86 (09/18 1855) Resp:  [11-31] 14 (09/18 1855) BP: (84-143)/(51-110) 99/74 mmHg (09/18 1855) SpO2:  [82 %-100 %] 100 % (09/18 1855) FiO2 (%):  [40 %-100 %] 40 % (09/18 1852) Weight:  [184 lb (83.462 kg)] 184 lb (83.462 kg) (09/18 1800) HEMODYNAMICS:   VENTILATOR SETTINGS: Vent Mode:  [-] PRVC FiO2 (%):  [40 %-100 %] 40 % Set Rate:  [14 bmp] 14 bmp Vt Set:  [580 mL] 580 mL PEEP:  [5 cmH20] 5 cmH20 Plateau Pressure:  [17 cmH20] 17 cmH20 INTAKE / OUTPUT: No intake or output data in the 24 hours ending 08/31/14 1912  PHYSICAL EXAMINATION: General: no distress Neuro:  RASS -4 HEENT:  Pupils reactive, ETT in place, C collar in place Cardiovascular:  Regular, no murmur Lungs:  No wheeze Abdomen:  Soft, nontender Musculoskeletal:  No edema Skin:  No rashes  LABS:  CBC  Recent Labs Lab 08/31/14 1629  WBC 4.4  HGB 12.4  HCT 36.3  PLT 207   BMET  Recent Labs Lab 08/31/14 1629  NA 142  K 4.2  CL 103  CO2 25  BUN 13  CREATININE 0.76  GLUCOSE 83   Electrolytes  Recent Labs Lab 08/31/14 1629  CALCIUM 8.9   ABG  Recent Labs Lab 08/31/14 1846  PHART 7.437  PCO2ART 38.0  PO2ART 363.0*   Liver Enzymes No results found for this basename: AST, ALT, ALKPHOS, BILITOT, ALBUMIN,  in the last 168 hours  Glucose No results found for this basename: GLUCAP,  in the last 168 hours  Imaging Ct Head Wo Contrast  08/31/2014   CLINICAL DATA:  Found unresponsive.  Hematoma the forehead.  EXAM: CT HEAD WITHOUT CONTRAST  CT CERVICAL SPINE WITHOUT CONTRAST  TECHNIQUE: Multidetector CT imaging of the head and cervical spine was performed following the standard protocol without intravenous contrast. Multiplanar CT image reconstructions of the cervical spine were also generated.  COMPARISON:  06/23/2014  FINDINGS: CT  HEAD FINDINGS  No acute intracranial abnormality. Specifically, no hemorrhage, hydrocephalus, mass lesion, acute infarction, or significant intracranial injury. No acute calvarial abnormality. Visualized paranasal sinuses and mastoids clear. Orbital soft tissues unremarkable.  CT CERVICAL SPINE FINDINGS  Alignment is normal. 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 Prevertebral soft tissues are normal. No fracture. No epidural or paraspinal hematoma.  C2 is rotated relative to C1, likely positional.  IMPRESSION: No acute intracranial abnormality.  No acute  bony abnormality within the cervical spine.   Electronically Signed   By: Charlett Nose M.D.   On: 08/31/2014 18:36   Ct Cervical Spine Wo Contrast  08/31/2014   CLINICAL DATA:  Found unresponsive.  Hematoma the forehead.  EXAM: CT HEAD WITHOUT CONTRAST  CT CERVICAL SPINE WITHOUT CONTRAST  TECHNIQUE: Multidetector CT imaging of the head and cervical spine was performed following the standard protocol without intravenous contrast. Multiplanar CT image reconstructions of the cervical spine were also generated.  COMPARISON:  06/23/2014  FINDINGS: CT HEAD FINDINGS  No acute intracranial abnormality. Specifically, no hemorrhage, hydrocephalus, mass lesion, acute infarction, or significant intracranial injury. No acute calvarial abnormality. Visualized paranasal sinuses and mastoids clear. Orbital soft tissues unremarkable.  CT CERVICAL SPINE FINDINGS  Alignment is normal. 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 Prevertebral soft tissues are normal. No fracture. No epidural or paraspinal hematoma.  C2 is rotated relative to C1, likely positional.  IMPRESSION: No acute intracranial abnormality.  No acute bony abnormality within the cervical spine.   Electronically Signed   By: Charlett Nose M.D.   On: 08/31/2014 18:36   Dg Chest Portable 1 View  08/31/2014   CLINICAL DATA:  Post intubation, seizures  EXAM: PORTABLE CHEST -  1 VIEW  COMPARISON:  05/30/2014  FINDINGS: Lungs are clear. No focal consolidation. No pleural effusion or pneumothorax.  The heart is normal in size.  Endotracheal tube terminates 3.5 cm above the carina.  Left chest port terminates at the cavoatrial junction.  Enteric tube courses into the stomach.  IMPRESSION: Endotracheal tube terminates 3.5 cm above the carina.  Additional support apparatus as above.  No evidence of acute cardiopulmonary disease.   Electronically Signed   By: Charline Bills M.D.   On: 08/31/2014 18:14    ASSESSMENT / PLAN:  NEUROLOGIC A:  Status epilepticus. P:   AED's per neurology RASS goal -3 until seizures under control Keep C collar for now  PULMONARY OETT 9/18 >>  A: Acute respiratory failure 2nd to seizures and inability to protect airway. P:   Full vent support until neuro status stable F/u CXR  CARDIOVASCULAR A:  Hypotension related to sedation. P:  Monitor hemodynamics  RENAL A:   No acute issues. P:   Monitor renal fx, urine outpt, electrolytes Goal even fluid balance  GASTROINTESTINAL A:   Nutrition. P:   NPO Tube feeds if unable to extubate soon Protonix for SUP  HEMATOLOGIC A:   No acute issues. P:  F/u CBC SQ heparin for DVT prevention  INFECTIOUS A:   No evidence for infection. P:   Monitor clinically  ENDOCRINE A:   No acute issues.   P:   Monitor blood sugar on BMET  RHEUMATOLOGY A: Hx of Behcet's syndrome. P: Hold outpt cytoxan for now  TODAY'S SUMMARY:  Updated pt's husband at bedside.  CC time 45 minutes.  Coralyn Helling, MD [facility] Pulmonary/Critical Care 08/31/2014, 7:58 PM Pager:  [phone] After 3pm call: [phone]

## 2014-08-31 NOTE — Progress Notes (Signed)
Patient's husband requesting patient's XXX status be removed. Confusion in ED regarding patient's husband just wanting a password set up, not XXX status. Patient placement notified and XXX was removed.

## 2014-08-31 NOTE — ED Notes (Signed)
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  Patient found unresponsive, hematoma on forehead, patient totally unresponsive to pain  Heard thud, found patient on ground, totally unresponsive, seizure activity en route, 3-5 minutes, 5 mg versed given en route, patient stopped seizing, totally unresponsive with EMS and upon presentation to the ED.  VSS, spontaneous respirations, 12 lead unremarkable. HR 104 BP: 114/78 CBG 78

## 2014-08-31 NOTE — Telephone Encounter (Signed)
Called patient and notified of Dr. Rosalyn Gess advisement. She is going to go ahead and take dose of Depakote now. Advised to call us back if anything changed or got worse.

## 2014-08-31 NOTE — ED Notes (Signed)
Patient having another seizure, Dr. Ophelia Charter notified

## 2014-08-31 NOTE — Progress Notes (Signed)
eLink Physician-Brief Progress Note Patient Name: Keylen Uzelac DOB: [birth year] MRN: [id number]   Date of Service  08/31/2014  HPI/Events of Note  Pt with status epilepticus and on vent. Asked to admit.    eICU Interventions  See orders.     Intervention Category Evaluation Type: New Patient Evaluation  Shan Levans 08/31/2014, 6:29 PM

## 2014-09-01 ENCOUNTER — Inpatient Hospital Stay (HOSPITAL_COMMUNITY): Payer: Federal, State, Local not specified - PPO

## 2014-09-01 LAB — COMPREHENSIVE METABOLIC PANEL
ALBUMIN: 2.8 g/dL — AB (ref 3.5–5.2)
ALT: 9 U/L (ref 0–35)
ANION GAP: 10 (ref 5–15)
AST: 16 U/L (ref 0–37)
Alkaline Phosphatase: 51 U/L (ref 39–117)
BILIRUBIN TOTAL: 0.3 mg/dL (ref 0.3–1.2)
BUN: 9 mg/dL (ref 6–23)
CALCIUM: 7.7 mg/dL — AB (ref 8.4–10.5)
CHLORIDE: 112 meq/L (ref 96–112)
CO2: 23 mEq/L (ref 19–32)
CREATININE: 0.59 mg/dL (ref 0.50–1.10)
GFR calc Af Amer: >90 mL/min (ref >90)
GFR calc non Af Amer: >90 mL/min (ref >90)
Glucose, Bld: 76 mg/dL (ref 70–99)
Potassium: 3.2 mEq/L — ABNORMAL LOW (ref 3.7–5.3)
Sodium: 145 mEq/L (ref 137–147)
Total Protein: 5.1 g/dL — ABNORMAL LOW (ref 6.0–8.3)

## 2014-09-01 LAB — GLUCOSE, CAPILLARY
GLUCOSE-CAPILLARY: 96 mg/dL (ref 70–99)
GLUCOSE-CAPILLARY: 72 mg/dL (ref 70–99)
GLUCOSE-CAPILLARY: 95 mg/dL (ref 70–99)
Glucose-Capillary: 101 mg/dL — ABNORMAL HIGH (ref 70–99)
Glucose-Capillary: 63 mg/dL — ABNORMAL LOW (ref 70–99)
Glucose-Capillary: 63 mg/dL — ABNORMAL LOW (ref 70–99)
Glucose-Capillary: 68 mg/dL — ABNORMAL LOW (ref 70–99)
Glucose-Capillary: 82 mg/dL (ref 70–99)

## 2014-09-01 LAB — CBC
HCT: 27 % — ABNORMAL LOW (ref 36.0–46.0)
Hemoglobin: 9.4 g/dL — ABNORMAL LOW (ref 12.0–15.0)
MCH: 33 pg (ref 26.0–34.0)
MCHC: 34.8 g/dL (ref 30.0–36.0)
MCV: 94.7 fL (ref 78.0–100.0)
Platelets: 137 10*3/uL — ABNORMAL LOW (ref 150–400)
RBC: 2.85 MIL/uL — ABNORMAL LOW (ref 3.87–5.11)
RDW: 14.1 % (ref 11.5–15.5)
WBC: 4.4 10*3/uL (ref 4.0–10.5)

## 2014-09-01 LAB — MAGNESIUM: MAGNESIUM: 1.8 mg/dL (ref 1.5–2.5)

## 2014-09-01 MED ORDER — PANTOPRAZOLE SODIUM 40 MG PO PACK
40.0000 mg | PACK | Freq: Every day | ORAL | Status: DC
  Administered 2014-09-01: 40 mg
  Filled 2014-09-01 (×2): qty 20

## 2014-09-01 MED ORDER — SODIUM CHLORIDE 0.9 % IV BOLUS (SEPSIS)
1000.0000 mL | Freq: Once | INTRAVENOUS | Status: AC
  Administered 2014-09-01: 1000 mL via INTRAVENOUS

## 2014-09-01 MED ORDER — DEXTROSE 50 % IV SOLN
INTRAVENOUS | Status: AC
  Administered 2014-09-01: 25 mL
  Filled 2014-09-01: qty 50

## 2014-09-01 MED ORDER — FENTANYL CITRATE 0.05 MG/ML IJ SOLN
50.0000 ug | INTRAMUSCULAR | Status: DC | PRN
  Administered 2014-09-01 (×2): 50 ug via INTRAVENOUS
  Filled 2014-09-01 (×2): qty 2

## 2014-09-01 MED ORDER — POTASSIUM CHLORIDE 20 MEQ/15ML (10%) PO LIQD
40.0000 meq | Freq: Once | ORAL | Status: AC
  Administered 2014-09-01: 40 meq
  Filled 2014-09-01 (×2): qty 30

## 2014-09-01 MED ORDER — DEXTROSE-NACL 5-0.9 % IV SOLN
INTRAVENOUS | Status: DC
  Administered 2014-09-01 – 2014-09-02 (×2): via INTRAVENOUS

## 2014-09-01 MED ORDER — DEXTROSE 50 % IV SOLN
25.0000 mL | Freq: Once | INTRAVENOUS | Status: AC | PRN
  Administered 2014-09-01: 25 mL via INTRAVENOUS

## 2014-09-01 MED ORDER — DEXTROSE 50 % IV SOLN: 25.0000 mL | Freq: Once | INTRAVENOUS | Status: AC | PRN

## 2014-09-01 NOTE — Progress Notes (Signed)
Hypoglycemic Event  CBG: 48  Treatment: D50 IV 25 mL  Symptoms: None  Follow-up CBG: Time:0033 CBG Result:101  Possible Reasons for Event: inadequate meal intake  Comments/MD notified:Protocol placed, CBG raised.    [name] E  Remember to initiate Hypoglycemia Order Set & complete

## 2014-09-01 NOTE — Progress Notes (Signed)
Pt crying, covering her head with blankets, kicking her side rail. This nurse attempted to calm pt. She stopped kicking but continues to cry. Pt states that she wants her husband.

## 2014-09-01 NOTE — Progress Notes (Signed)
EEG completed; results pending.    

## 2014-09-01 NOTE — Procedures (Signed)
Extubation Procedure Note  Patient Details:   Name: Brenda Mcguire DOB: [birth year] MRN: [id number]   Airway Documentation:     Evaluation  O2 sats: stable throughout Complications: No apparent complications Patient did tolerate procedure well. Bilateral Breath Sounds: Clear Suctioning: Airway Yes. Extubated per Dr. Jarrett Ables to 2L [place]  Pt doing well  Mirza Kidney, Rudy Jew 09/01/2014, 4:14 PM

## 2014-09-01 NOTE — Progress Notes (Signed)
Chaplain Note: Reported to Trauma C to relieve [employer]. Met patient's husband in Consultation Room A and provided emotional support and ministry of presence. Engaged in active and reflective listening to encourage patient's wife to talk about what he and his wife were going through. Listened empathically as he shared their story. Provided encouragement, took him to see patient and later helped him find the cafeteria.  Follow up recommended. Dorris Fetch, Chaplain

## 2014-09-01 NOTE — Consult Note (Addendum)
ELECTROENCEPHALOGRAM REPORT  Patient: Brenda Mcguire       Room #: 1O10 EEG No. ID: [id number] Age: 53 y.o.        Sex: female Referring Physician: Jonni Sanger Report Date:  09/01/2014        Interpreting Physician: Aline Brochure  History: [name] is an 53 y.o. female history of seizures as well as seizures presented to the emergency room with multiple intractable spells and seizure-like activity. Patient was intubated for airway protection and was started on propofol for sedation as well as control of seizure-like activity.  Indications for study:  Rule out partial or generalized seizure activity.  Technique: This is an 18 channel routine scalp EEG performed at the bedside with bipolar and monopolar montages arranged in accordance to the international 10/20 system of electrode placement.   Description: Patient was intubated on mechanical ventilation at the time of this study. Background activity consisted of diffuse moderate to the times when the amplitude diffuse irregular delta activity with superimposed faster rhythms admixed requesting. As well there were numerous recordings of symmetrical sleep spindles, vertex waves and K-complexes. Photic stimulation was not performed. No focal or generalized epileptiform discharges were recorded.  Interpretation: This is a normal EEG recording within normal sleep pattern. No evidence of seizure activity was recorded.   Venetia Maxon M.D. [facility] [phone]

## 2014-09-01 NOTE — Progress Notes (Signed)
PULMONARY / CRITICAL CARE MEDICINE   Name: Brenda Mcguire MRN: [id number] DOB: [birth year]    ADMISSION DATE:  08/31/2014  REFERRING MD :  ER  CHIEF COMPLAINT:  Feels weird  INITIAL PRESENTATION:  53 yo female with status epilepticus.  Intubated for airway protection.  PCCM asked to admit to ICU.  She has hx of Behcet's disease & pseudoseizures.  She has several prior admissions for status epilepticus and intubation. she had syncopal event, and then seizure for about 3 to 5 minutes.  She was noted to have recurrent seizure in ER.  She was given keppra and ativan.  She was intubated  STUDIES:  9/18 CT head >> no acute findings 9/18 CT neck >> no acute findings  SIGNIFICANT EVENTS: 9/18 Admit, neurology consulted  SUBJECTIVE: Unresponsive on propofol gtt Was responding last night per RN Low sugars afebrile  VITAL SIGNS: Temp:  [93.4 F (34.1 C)-98.7 F (37.1 C)] 98.2 F (36.8 C) (09/19 0317) Pulse Rate:  [52-110] 69 (09/19 0755) Resp:  [11-31] 12 (09/19 0755) BP: (81-143)/(50-110) 125/74 mmHg (09/19 0755) SpO2:  [82 %-100 %] 100 % (09/19 0755) FiO2 (%):  [40 %-100 %] 40 % (09/19 0755) Weight:  [83.1 kg (183 lb 3.2 oz)-85.1 kg (187 lb 9.8 oz)] 85.1 kg (187 lb 9.8 oz) (09/19 0500) HEMODYNAMICS:   VENTILATOR SETTINGS: Vent Mode:  [-] PRVC FiO2 (%):  [40 %-100 %] 40 % Set Rate:  [14 bmp] 14 bmp Vt Set:  [580 mL] 580 mL PEEP:  [5 cmH20] 5 cmH20 Plateau Pressure:  [16 cmH20-17 cmH20] 16 cmH20 INTAKE / OUTPUT:  Intake/Output Summary (Last 24 hours) at 09/01/14 0758 Last data filed at 09/01/14 0700  Gross per 24 hour  Intake 3312.91 ml  Output    850 ml  Net 2462.91 ml    PHYSICAL EXAMINATION: General: no distress, sedated on propofol gtt Neuro:  RASS -4 HEENT:  Pupils reactive, ETT in place, C collar in place Cardiovascular:  Regular, no murmur Lungs:  No wheeze Abdomen:  Soft, nontender Musculoskeletal:  No edema Skin:  No rashes  LABS:  CBC  Recent  Labs Lab 08/31/14 1629 09/01/14 0615  WBC 4.4 4.4  HGB 12.4 9.4*  HCT 36.3 27.0*  PLT 207 137*   BMET  Recent Labs Lab 08/31/14 1629  NA 142  K 4.2  CL 103  CO2 25  BUN 13  CREATININE 0.76  GLUCOSE 83   Electrolytes  Recent Labs Lab 08/31/14 1629  CALCIUM 8.9   ABG  Recent Labs Lab 08/31/14 1846  PHART 7.437  PCO2ART 38.0  PO2ART 363.0*   Liver Enzymes No results found for this basename: AST, ALT, ALKPHOS, BILITOT, ALBUMIN,  in the last 168 hours  Glucose  Recent Labs Lab 08/31/14 2027 08/31/14 2348 09/01/14 0033 09/01/14 0316 09/01/14 0500  GLUCAP 73 48* 101* 63* 82    Imaging Ct Head Wo Contrast  08/31/2014   CLINICAL DATA:  Found unresponsive.  Hematoma the forehead.  EXAM: CT HEAD WITHOUT CONTRAST  CT CERVICAL SPINE WITHOUT CONTRAST  TECHNIQUE: Multidetector CT imaging of the head and cervical spine was performed following the standard protocol without intravenous contrast. Multiplanar CT image reconstructions of the cervical spine were also generated.  COMPARISON:  06/23/2014  FINDINGS: CT HEAD FINDINGS  No acute intracranial abnormality. Specifically, no hemorrhage, hydrocephalus, mass lesion, acute infarction, or significant intracranial injury. No acute calvarial abnormality. Visualized paranasal sinuses and mastoids clear. Orbital soft tissues unremarkable.  CT CERVICAL SPINE FINDINGS  Alignment is normal. 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 Prevertebral soft tissues are normal. No fracture. No epidural or paraspinal hematoma.  C2 is rotated relative to C1, likely positional.  IMPRESSION: No acute intracranial abnormality.  No acute bony abnormality within the cervical spine.   Electronically Signed   By: Charlett Nose M.D.   On: 08/31/2014 18:36   Ct Cervical Spine Wo Contrast  08/31/2014   CLINICAL DATA:  Found unresponsive.  Hematoma the forehead.  EXAM: CT HEAD WITHOUT CONTRAST  CT CERVICAL SPINE WITHOUT CONTRAST   TECHNIQUE: Multidetector CT imaging of the head and cervical spine was performed following the standard protocol without intravenous contrast. Multiplanar CT image reconstructions of the cervical spine were also generated.  COMPARISON:  06/23/2014  FINDINGS: CT HEAD FINDINGS  No acute intracranial abnormality. Specifically, no hemorrhage, hydrocephalus, mass lesion, acute infarction, or significant intracranial injury. No acute calvarial abnormality. Visualized paranasal sinuses and mastoids clear. Orbital soft tissues unremarkable.  CT CERVICAL SPINE FINDINGS  Alignment is normal. 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 Prevertebral soft tissues are normal. No fracture. No epidural or paraspinal hematoma.  C2 is rotated relative to C1, likely positional.  IMPRESSION: No acute intracranial abnormality.  No acute bony abnormality within the cervical spine.   Electronically Signed   By: Charlett Nose M.D.   On: 08/31/2014 18:36   Dg Chest Port 1 View  09/01/2014   CLINICAL DATA:  Respiratory failure.  Status epilepticus.  EXAM: PORTABLE CHEST - 1 VIEW  COMPARISON:  08/31/2014  FINDINGS: Endotracheal tube remains in place with tip approximately 2.5 cm above the carina. Left subclavian Port-A-Cath is unchanged with tip overlying the lower SVC. Enteric tube courses into the left upper abdomen with side hole projecting over the expected location of the stomach and tip not imaged. Cardiomediastinal silhouette is within normal limits. There is a small amount of patchy retrocardiac opacity in the left lower lobe. Right lung remains clear. No pleural effusion or pneumothorax is identified.  IMPRESSION: 1. Endotracheal tube 2.5 cm above the carina. 2. Minimal left basilar atelectasis.   Electronically Signed   By: Sebastian Ache   On: 09/01/2014 07:23   Dg Chest Portable 1 View  08/31/2014   CLINICAL DATA:  Post intubation, seizures  EXAM: PORTABLE CHEST - 1 VIEW  COMPARISON:  05/30/2014  FINDINGS:  Lungs are clear. No focal consolidation. No pleural effusion or pneumothorax.  The heart is normal in size.  Endotracheal tube terminates 3.5 cm above the carina.  Left chest port terminates at the cavoatrial junction.  Enteric tube courses into the stomach.  IMPRESSION: Endotracheal tube terminates 3.5 cm above the carina.  Additional support apparatus as above.  No evidence of acute cardiopulmonary disease.   Electronically Signed   By: Charline Bills M.D.   On: 08/31/2014 18:14    ASSESSMENT / PLAN:  NEUROLOGIC A:  Status epilepticus. P:   AED's per neurology RASS goal 0 now that seizures under control Keep C collar for now  PULMONARY OETT 9/18 >>  A: Acute respiratory failure 2nd to seizures and inability to protect airway. P:   SBTs with goal extubation once fuly awake & following commands F/u CXR  CARDIOVASCULAR A:  Hypotension related to sedation -resolved P:  Monitor hemodynamics  RENAL A:   hypokalemia P:   Monitor renal fx, urine outpt, electrolytes Goal even fluid balance  GASTROINTESTINAL A:   Nutrition. P:   NPO Tube feeds if unable to  extubate soon Protonix for SUP  HEMATOLOGIC A:   No acute issues. P:  F/u CBC SQ heparin for DVT prevention  INFECTIOUS A:   No evidence for infection. P:   Monitor clinically  ENDOCRINE A:  Hypoglycemia  P:   CBGs, add dextrose to IVFs  RHEUMATOLOGY A: Hx of Behcet's syndrome. P: Hold outpt cytoxan for now  TODAY'S SUMMARY:  Updated pt's husband at bedside.  CC time 35 minutes.  Cyril Mourning MD. Tonny Bollman. [facility] Pager [phone] If no response call [phone]    09/01/2014, 7:58 AM

## 2014-09-01 NOTE — Progress Notes (Addendum)
eLink Physician-Brief Progress Note Patient Name: Brenda Mcguire DOB: [birth year] MRN: [id number]  This note was placed in error. The text has been deleted but the original note is accessible through the "versions" function of [employer].               Intervention Category Minor Interventions: OtherCurt Bears, Muneer Leider R. 09/01/2014, 8:29 PM

## 2014-09-01 NOTE — Progress Notes (Signed)
INITIAL NUTRITION ASSESSMENT  DOCUMENTATION CODES Per approved criteria  -Not Applicable   INTERVENTION: If pt unable to be extubated, recomend initiation of Vital HP @ 20 ml/hr via OGT and increase by 10 ml every 4 hours to goal rate of 55 ml/hr.   Tube feeding regimen with propofol provides 1650 kcal (101% of needs), 115 grams of protein, and 1109 ml of H2O.   NUTRITION DIAGNOSIS: Inadequate oral intake related to inability to eat as evidenced by NPO.   Goal: Pt to meet >/= 90% of their estimated nutrition needs   Monitor:  Weight trend, respiratory status, TF initiation and toleration, labs  Reason for Assessment: New Vent  53 y.o. female  Admitting Dx: Status epilepticus  ASSESSMENT: 53 yo female with status epilepticus. Intubated for airway protection. PCCM asked to admit to ICU. She has hx of Behcet's disease. She has several prior admissions for status epilepticus and intubation.  - Pt with several hypoglycemic events this am.  - Per RN, may be able to extubate today.  Patient is currently intubated on ventilator support MV: 7.3 L/min Temp (24hrs), Avg:97.1 F (36.2 C), Min:93.4 F (34.1 C), Max:98.7 F (37.1 C)  Propofol: 12.5 ml/hr, provides additional 330 kcal  Labs: CBGs: 63-82 K low Na and BUN WNL  Height: Ht Readings from Last 1 Encounters:  08/31/14  (1.702 m)    Weight: Wt Readings from Last 1 Encounters:  09/01/14 187 lb 9.8 oz (85.1 kg)    Ideal Body Weight: 61.6 kg  % Ideal Body Weight: 138%  Wt Readings from Last 10 Encounters:  09/01/14 187 lb 9.8 oz (85.1 kg)  07/18/14 184 lb 12 oz (83.802 kg)  06/27/14 181 lb (82.101 kg)  06/13/14 182 lb 3.2 oz (82.645 kg)  06/07/14 184 lb (83.462 kg)  05/31/14 182 lb 8.7 oz (82.8 kg)  03/16/14 189 lb (85.73 kg)  03/13/14 190 lb (86.183 kg)  02/14/14 183 lb (83.008 kg)  11/30/13 190 lb (86.183 kg)    Usual Body Weight: unknown, weight appears stable per chart history  % Usual Body  Weight: n/a  BMI:  Body mass index is 29.38 kg/(m^2).  Estimated Nutritional Needs: Kcal: 1631 Protein: 100-115 g Fluid: Per MD  Skin: Intact  Diet Order: NPO  EDUCATION NEEDS: -Education not appropriate at this time   Intake/Output Summary (Last 24 hours) at 09/01/14 1413 Last data filed at 09/01/14 1300  Gross per 24 hour  Intake 3720.56 ml  Output   2025 ml  Net 1695.56 ml    Last BM: prior to admission   Labs:   Recent Labs Lab 08/31/14 1629 09/01/14 0615  NA 142 145  K 4.2 3.2*  CL 103 112  CO2 25 23  BUN 13 9  CREATININE 0.76 0.59  CALCIUM 8.9 7.7*  MG  --  1.8  GLUCOSE 83 76    CBG (last 3)   Recent Labs  09/01/14 0759 09/01/14 0838 09/01/14 1211  GLUCAP 63* 96 72    Scheduled Meds: . chlorhexidine  15 mL Mouth Rinse BID  . heparin subcutaneous  5,000 Units Subcutaneous 3 times per day  . insulin aspart  2-6 Units Subcutaneous 6 times per day  . lacosamide (VIMPAT) IV  100 mg Intravenous Q12H  . pantoprazole sodium  40 mg Per Tube Q1200    Continuous Infusions: . dextrose 5 % and 0.9% NaCl 75 mL/hr at 09/01/14 0824  . propofol 25 mcg/kg/min (09/01/14 1209)    Past Medical History  Diagnosis Date  . GERD (gastroesophageal reflux disease)   . Hyperlipidemia   . History of blood transfusion   . Chronic headaches   . Phlebitis   . History of colonic polyps   . 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 Tonic clonic seizures   . Anxiety     "situational stress"    Past Surgical History  Procedure Laterality Date  . Cholecystectomy    . Tonsillectomy  1970  . Appendectomy  1975  . Lumbar fusion  1999    L5-S1  . Back surgery    . Abdominal hysterectomy  1988  . Portacath placement Left 2008  . Stone extraction with basket      Ebbie Latus RD, LDN

## 2014-09-01 NOTE — Progress Notes (Signed)
Pts husband arrived to unit asking to speak to RN.  Update was given to husband that EEG was currently being completed, the patient was lightly sedated and restrained for safety purposes.  When husband was told that pt was restrained, he became visibly angered and demanded the restraints be removed.  He stated "my lawyer has papers that say that she is never to be restrained." It was explained to the patient's husband that the patient had been restrained to protect her and prevent self extubation with known seizure activity.  The plan of care was explained to the pts husband; the patient was currently having an EEG completed and, depending on the results, could possibly be extubated later today.  The pts husband demanded the restraints be removed regardless of plan of care or explanation of need for restraints.  Earnest Rosier, NP with CCM and the [facility] were notified of husband's demands.  The pts husband was instructed that the restraints would be removed against the MD's advice.  The patient's husband then stated "well just leave them on then."  The pts husband then left the unit and stated that he would "be back in 30 minutes to get the results of the EEG."  It was explained to the husband that the neurologist who was reading EEGs was also seeing patients and it would be read as quickly as possible.  The pts husband returned later in the afternoon, requesting the results of the EEG.  Dr. Roseanne Reno was paged.  The patient's husband became upset at having to wait for the MD to return my calls.  He requested to speak to the [facility] about his concerns.  The [facility] was paged but was involved in direct patient care and was unable to speak to him directly at that time.  The EEG results were noted, CCM was contacted for assessment of extubation.  Per MD, ok to extubate patient to 2L [place].  Information was relayed to the patients husband who was thankful for responsiveness.  Will continue to monitor and follow. Nakima Fluegge, [facility]

## 2014-09-01 NOTE — ED Provider Notes (Signed)
Medical screening examination/treatment/procedure(s) were conducted as a shared visit with non-physician practitioner(s) or resident and myself. I personally evaluated the patient during the encounter and agree with the findings.  I have personally reviewed any xrays and/ or EKG's with the provider and I agree with interpretation.  Patient with history of seizure disorder on Depakote, Keppra, Topamax for which he says she's taking regularly, no recent infection or new meds or drugs or alcohol presents with seizures and altered mental status. Patient had one seizure prior to arrival lasting 5 minutes and then had 2 seizures in ER. Ordered keppra IV 1000 mg, neuro consult and plan for medicine admission. EOMFI, mild fatigue/ post ictal appearance, moves all ext equal bilateral with 5 + strength, mild dry mm, mild tachycardia, abd soft./ NT.  Patient continued to have recurrent seizures and not returning to baseline the ER. Neurology evaluated in ER. ICU admission. CRITICAL CARE Performed by: Enid Skeens  Total critical care time: 70 min  Critical care time was exclusive of separately billable procedures and treating other patients.  Critical care was necessary to treat or prevent imminent or life-threatening deterioration.  Critical care was time spent personally by me on the following activities: development of treatment plan with patient and/or surrogate as well as nursing, discussions with consultants, evaluation of patient's response to treatment, examination of patient, obtaining history from patient or surrogate, ordering and performing treatments and interventions, ordering and review of laboratory studies, ordering and review of radiographic studies, pulse oximetry and re-evaluation of patient's condition.   I was present for emergent intubation for status epilepticus/ airway protection.   Status epilepticus, AMS   Enid Skeens, MD 09/01/14 [phone]

## 2014-09-01 NOTE — Progress Notes (Signed)
Utilization Review Completed.Brenda Mcguire T9/19/2015  

## 2014-09-01 NOTE — Progress Notes (Signed)
Hypoglycemic Event  CBG: 63  Treatment: D50 IV 25 mL  Symptoms: None  Follow-up CBG: Time: 0839 CBG Result: 96  Possible Reasons for Event: Unknown  Comments/MD notified:Dr. Vassie Loll.  Orders rec'd/    Hameed Kolar Hope  Remember to initiate Hypoglycemia Order Set & complete

## 2014-09-01 NOTE — Progress Notes (Signed)
Hypoglycemic Event  CBG: 63  Treatment: D50 IV 25 mL  Symptoms: None  Follow-up CBG: Time:500  CBG Result:83  Possible Reasons for Event: Inadequate meal intake  Comments/MD notified:Raised with D50 Will pass along to dayRn to notify rounding team    [name] E  Remember to initiate Hypoglycemia Order Set & complete

## 2014-09-01 NOTE — Progress Notes (Signed)
eLink Physician-Brief Progress Note Patient Name: Shelva Hetzer DOB: [birth year] MRN: [id number]   Date of Service  09/01/2014  HPI/Events of Note   Pt demanding to leave AMA. RN unclear on whether patient has capacity.    eICU Interventions   Pysch consult to evaluate capacity.      Intervention Category Major Interventions: OtherCurt Bears, Valree Feild R. 09/01/2014, 6:17 PM

## 2014-09-01 NOTE — Progress Notes (Signed)
Pt attempting to get OOB. When questioned about where she was going she stated "home."  Pt stated that she would like to leave AMA and "knows her rights."  Elink MD notified of patient's request and events of the day.  MD ordered psych consult to determine competency in making decision to leave AMA.  Pt notified and then notified husband of MD's order. Will cont to monitor. Haylei Cobin, [facility]

## 2014-09-01 NOTE — Progress Notes (Signed)
Dr. Belinda Block notified of patient's BP 77/49. Order received for bolus. Will monitor.

## 2014-09-01 NOTE — Progress Notes (Signed)
Subjective: Patient remains intubated and on mechanical ventilation. She has spontaneous respirations however. No recurrent seizures reported overnight. She has been agitated intermittently and required upper extremity restraints.  Objective: Current vital signs: BP 131/69  Pulse 63  Temp(Src) 100.1 F (37.8 C) (Oral)  Resp 18  Ht  (1.702 m)  Wt 85.1 kg (187 lb 9.8 oz)  BMI 29.38 kg/m2  SpO2 100%  Neurologic Exam: Patient was only minimally responsive to noxious stimuli.  Pupils were equal and reacted normally to light. Extraocular movements were intact oculocephalic maneuvers. Face symmetrical with no focal weakness. Extremities were flaccid throughout. Chest occasional spontaneous extremity movements which were nonpurposeful. Deep tendon reflexes are normal and symmetrical. Plantar responses were mute bilaterally.  EKG performed today shows a pattern of activity consistent with normal sleep. There was no evidence of seizure activity recorded.  Medications: I have reviewed the patient's current medications.  Assessment/Plan: 53 year old lady with a history of seizures and pseudoseizures admitted with apparent status epilepticus. She was intubated for airway protection and further management of seizure-like activity. She's had no seizure activity reported overnight. EEG today showed no seizure activity as well.  Recommend changes in current management of the extubation this is possible. We will continue to follow this patient with you. No changes in current antiepileptic drugs recommended.  C.R. Roseanne Reno, MD [facility] [phone]  09/01/2014  4:09 PM

## 2014-09-02 DIAGNOSIS — F329 Major depressive disorder, single episode, unspecified: Secondary | ICD-10-CM

## 2014-09-02 DIAGNOSIS — F3289 Other specified depressive episodes: Secondary | ICD-10-CM

## 2014-09-02 LAB — BASIC METABOLIC PANEL
ANION GAP: 12 (ref 5–15)
BUN: 4 mg/dL — ABNORMAL LOW (ref 6–23)
CO2: 25 mEq/L (ref 19–32)
Calcium: 8.7 mg/dL (ref 8.4–10.5)
Chloride: 108 mEq/L (ref 96–112)
Creatinine, Ser: 0.6 mg/dL (ref 0.50–1.10)
GFR calc Af Amer: >90 mL/min (ref >90)
GLUCOSE: 103 mg/dL — AB (ref 70–99)
POTASSIUM: 3.6 meq/L — AB (ref 3.7–5.3)
Sodium: 145 mEq/L (ref 137–147)

## 2014-09-02 LAB — GLUCOSE, CAPILLARY
GLUCOSE-CAPILLARY: 94 mg/dL (ref 70–99)
Glucose-Capillary: 107 mg/dL — ABNORMAL HIGH (ref 70–99)
Glucose-Capillary: 103 mg/dL — ABNORMAL HIGH (ref 70–99)

## 2014-09-02 LAB — CBC
HCT: 30.4 % — ABNORMAL LOW (ref 36.0–46.0)
Hemoglobin: 10.5 g/dL — ABNORMAL LOW (ref 12.0–15.0)
MCH: 32 pg (ref 26.0–34.0)
MCHC: 34.5 g/dL (ref 30.0–36.0)
MCV: 92.7 fL (ref 78.0–100.0)
PLATELETS: 170 10*3/uL (ref 150–400)
RBC: 3.28 MIL/uL — AB (ref 3.87–5.11)
RDW: 13.9 % (ref 11.5–15.5)
WBC: 5.6 10*3/uL (ref 4.0–10.5)

## 2014-09-02 MED ORDER — DIVALPROEX SODIUM 250 MG PO DR TAB: 500.0000 mg | DELAYED_RELEASE_TABLET | Freq: Three times a day (TID) | ORAL | Status: DC

## 2014-09-02 MED ORDER — LEVETIRACETAM 500 MG PO TABS: 1500.0000 mg | ORAL_TABLET | Freq: Two times a day (BID) | ORAL | Status: DC

## 2014-09-02 MED ORDER — SERTRALINE HCL 50 MG PO TABS
50.0000 mg | ORAL_TABLET | Freq: Every day | ORAL | Status: DC
  Filled 2014-09-02: qty 1

## 2014-09-02 MED ORDER — HEPARIN SOD (PORK) LOCK FLUSH 100 UNIT/ML IV SOLN
500.0000 [IU] | INTRAVENOUS | Status: AC | PRN
  Administered 2014-09-02: 500 [IU]

## 2014-09-02 MED ORDER — SERTRALINE HCL 50 MG PO TABS: 50.0000 mg | ORAL_TABLET | Freq: Every day | ORAL | Status: DC

## 2014-09-02 NOTE — Discharge Summary (Signed)
Physician Discharge Summary  Patient ID: Brenda Mcguire MRN: [id number] DOB/AGE: June 04, 1961 53 y.o.  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 ADelice Leschfor outpatient seizure control  Resume xanax, valium, gabapentin, norco & phenergan as previously prescribed.  No new Rx given at discharge.   Pt to arrange outpatient counseling for herself and couples counseling   Acute Respiratory Failure  Discharge Plan: Resolved, no further follow up required.    Hypotension   Discharge Plan: In setting of sedation, resolved.  No further follow up required.   Hypokalemia  Discharge Plan: Follow up BMP with PCP PRN.    Hypoglycemia  Discharge Plan: In setting of NPO status, no acute follow up required.   Hx Behcet's Syndrome   Discharge Plan: Resume Cytoxan as previously prescribed                  DISCHARGE SUMMARY   WMeghan Warshawskyis a 53y.o. 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  She called her Neurologist's office 9/16 stating she "felt weird and was worried she was going to have a seizure".  She was instructed to take an additional depakote .  On 9/18 she was noted to be confused, irritational, and combative and her husband was advised to take her to the ER.  Prior to this she reportedly had a syncopal episode and then a 3-5 minute seizure.  After arrival to  the ER, she had a recurrent seizure and was medicated with keppra & ativan.  She required intubation for airway protection.  Patient was admitted to ICU for mechanical ventilation support and anti-epileptic stabilization.  She was extubated on 9/16 to 2L per [place] and ultimately weaned from oxygen.  Post extubation, the patient had noted labile mood with swings from agitation to crying / kicking the bed rails.  She was evaluated by Neurology.  Repeat EEG was within normal limits, no seizure activity recorded.  Given recurrent admissions, she was also evaluated by Psychiatry and was recommended for zoloft daily with outpatient counseling.    She has had multiple admissions for the same presentation. Most recent in 05/2014 with a continuous EEG 05/30/14 demonstrating multiple typical events on video w/o EEG correlation, no epileptiform discharges. Slowing and disorganization suggestive of mild encephalopathy likely related to sedation.  She is intubated and post extubation, immediately wants to go home / leave AMA.  She is medically cleared for discharge 09/02/14.  Instructions as above.    STUDIES:  9/18 CT head >> no acute findings  9/18 CT neck >> no acute findings   SIGNIFICANT  EVENTS:  9/18 Admit, neurology consulted  Discharge Exam: General: no distress  Neuro: AAOx4, speech clear, MAE HEENT:  Mm pink/moist Cardiovascular: Regular, no murmur  Lungs: No wheeze  Abdomen: Soft, nontender  Musculoskeletal: No edema  Skin: No rashes   Filed Vitals:   09/02/14 0700 09/02/14 0753 09/02/14 1300 09/02/14 1400  BP: 122/64  137/65 127/63  Pulse: 80  82 76  Temp:  99.7 F (37.6 C)    TempSrc:  Oral    Resp: _0 Height:      Weight:      SpO2: 95%  96% 100%     Discharge Labs  BMET  Recent Labs Lab 08/31/14 1629 09/01/14 0615 09/02/14 0500  NA 142 145 145  K 4.2 3.2* 3.6*  CL 103 112 108  CO2 _1 GLUCOSE 83 76 103*  BUN 13 9 4*  CREATININE 0.76 0.59 0.60  CALCIUM  8.9 7.7* 8.7  MG  --  1.8  --    CBC  Recent Labs Lab 08/31/14 1629 09/01/14 0615 09/02/14 0500  HGB 12.4 9.4* 10.5*  HCT 36.3 27.0* 30.4*  WBC 4.4 4.4 5.6  PLT 207 137* 170      Medication List    STOP taking these medications       Lacosamide 100 MG Tabs  Commonly known as:  VIMPAT     topiramate 25 MG tablet  Commonly known as:  TOPAMAX      TAKE these medications       ALPRAZolam 0.5 MG tablet  Commonly known as:  XANAX  Take 1 tablet (0.5 mg total) by mouth 3 (three) times daily as needed for anxiety.     cyclophosphamide 25 MG tablet  Commonly known as:  CYTOXAN  Take 75 mg by mouth daily. Give on an empty stomach 1 hour before or 2 hours after meals.     diazepam 5 MG tablet  Commonly known as:  VALIUM  Take 5 mg by mouth every 6 (six) hours as needed for anxiety.     divalproex 250 MG DR tablet  Commonly known as:  DEPAKOTE  Take 2 tablets (500 mg total) by mouth 3 (three) times daily.     gabapentin 300 MG capsule  Commonly known as:  NEURONTIN  Take 3 capsules (900 mg total) by mouth 3 (three) times daily.     HYDROcodone-acetaminophen 5-325 MG per tablet  Commonly known as:  NORCO/VICODIN  Take 1 tablet by mouth 3 (three) times daily as needed for moderate pain.     levETIRAcetam 500 MG tablet  Commonly known as:  KEPPRA  Take 3 tablets (1,500 mg total) by mouth 2 (two) times daily.     promethazine 50 MG tablet  Commonly known as:  PHENERGAN  Take 0.5 tablets (25 mg total) by mouth every 6 (six) hours as needed for nausea or vomiting.     sertraline 50 MG tablet  Commonly known as:  ZOLOFT  Take 1 tablet (50 mg total) by mouth daily.        Follow-up Information   Schedule an appointment as soon as possible for a visit with Cameron Sprang, MD. (To be seen within one week )    Specialty:  Neurology   Contact information:   [address] [place] [address] [phone]       Schedule an appointment as soon as  possible for a visit with Webb Silversmith, NP. (For  hospital follow up )    Specialty:  Internal Medicine   Contact information:   [address] [place] [address] [phone]        Disposition:  Home.  No new home needs identified.    Discharged Condition: Jelani Vreeland has met maximum benefit of inpatient care and is medically stable and cleared for discharge.  Patient is pending follow up as above.      Time spent on disposition:  Greater than 35 minutes.   Signed: Noe Gens, NP-C [facility] Pgr: [phone] Office: [phone]  Physician Statement:   The Patient was personally examined, the discharge assessment and plan has been personally reviewed and I agree with ACNP's assessment and plan. > 30 minutes of time have been dedicated to discharge assessment, planning and discharge instructions.    Rigoberto Noel MD

## 2014-09-02 NOTE — Progress Notes (Signed)
CSW Welford Roche met with pt and provided her with supportive counseling and list of f/u [facility] resources.

## 2014-09-02 NOTE — Progress Notes (Signed)
Patient D/C.  Taken via wheelchair with possessions and husband at side to entrance and assisted into vehicle. Desarie Feild C

## 2014-09-02 NOTE — Plan of Care (Signed)
Problem: Phase I Progression Outcomes Goal: Seizure activity controlled Outcome: Completed/Met Date Met:  09/02/14 No seizure activity noted on EEG  Problem: Discharge Progression Outcomes Goal: Barriers To Progression Addressed/Resolved Outcome: Completed/Met Date Met:  09/02/14 Orders for Psych consult

## 2014-09-02 NOTE — Progress Notes (Addendum)
Chaplain met pt's husband in hall who asked for a visit.  Chaplain had history with pt and husband from ED when admitted.  Chaplain visited with MD and RN before entering room.  Relational dynamics between pt and husband appear to be problematic and unhelpful in pt's care.  Pt was crying as well as verbalizing anger towards husband.  Husband's replies to pt's comments were observed by chaplain to be unhelpful to RN and pt so chaplain asked husband to walk together off unit.  Chaplain offered empathetic listening to husband who expressed earlier separation with pt and current financial stress factors.  Chaplain returned to pt room to offer emotional, spiritual support as well as ministry of presence.  Pt asked for husband to return and chaplain encouraged couple to seek peace in room for the sake of pt's healing.  Chaplain remained close by room to observe ongoing dynamics, seemed peaceful at time so chaplain departed unit.  Chaplain will follow up as needed.  Brenda Mcguire

## 2014-09-02 NOTE — Progress Notes (Signed)
Subjective: Patient is experienced recurrent seizure since extubation. She admitted that she has not been taking Vimpat from the past 1-1/2 months because of expenses as well as sedation when she takes it.  Objective: Current vital signs: BP 122/64  Pulse 80  Temp(Src) 99.7 F (37.6 C) (Oral)  Resp 15  Ht  (1.702 m)  Wt 82.2 kg (181 lb 3.5 oz)  BMI 28.38 kg/m2  SpO2 95%  Neurologic Exam: Alert and in no acute distress. Patient was moderately agitated and argumentative. Speech was normal. The patient moved extremities equally with no signs of focal weakness.  Medications: I have reviewed the patient's current medications.  Assessment/Plan: Patient is doing well at this point and stable with no recurrence of seizure activity. No objection to the patient being discharged home at this point.  Recommendations: 1. Continue Depakote at 500 mg 3 times a day 2. Increase Keppra to 1500 mg twice a day. 3. Followup with Dr. Karel Jarvis at [facility] by followup appointment.  C.R. Roseanne Reno, MD [facility] [phone]  09/02/2014  1:18 PM

## 2014-09-02 NOTE — Progress Notes (Signed)
Patient ID: Brenda Mcguire, female   DOB: 1961/02/23, 53 y.o.   MRN: [id number] Called to [facility] and spoke with patient's nurse to initiate tele-psych. The nurse states earlier in the shift patient was trying to leave the hospital against medical advice. At this time, the patient is not attempting to leave the hospital. The patient is sleeping and tele-psych evaluation is unable to be completed at this time.   Alberteen Sam, FNP-BC [facility]

## 2014-09-02 NOTE — Consult Note (Addendum)
[facility] Face-to-Face Psychiatry Consult   Reason for Consult:  Depression, establish capacity  Referring Physician: Dr. Dorann Lodge is an 53 y.o. female. Total Time spent with patient: 30 minutes  Assessment: AXIS I:  Depression - consider Adjustment Disorder with Depressed Mood and Anxiety, versus Depression Secondary  To Seizure Disorder  AXIS II:  Deferred AXIS III:   Past Medical History  Diagnosis Date  . GERD (gastroesophageal reflux disease)   . Hyperlipidemia   . History of blood transfusion   . Chronic headaches   . Phlebitis   . History of colonic polyps   . 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 Tonic clonic seizures   . Anxiety     "situational stress"   AXIS IV:   Chronic medical illness, marital strain, adult daughter having financial difficulties AXIS V:  51-60 moderate symptoms  Plan:  No evidence of imminent risk to self or others at present.   Patient does not meet criteria for psychiatric inpatient admission.  Subjective:   Brenda Mcguire is a 53 y.o. female patient admitted with seizures .  HPI:  Patient is a 53 year old woman, who has a history of seizure disorder. She states she normally has an aura prior to onset of seizures and she recently contacted her Neurologist to report increased concern about imminent seizure, due to which Depakote was increased. On 9/18 she was admitted due being found unresponsive, with  Forehead laceration, and had seizure en route to hospital. She has had no further seizure activity and EEG was negative for seizure activity At this time patient is , as discussed with Nursing Staff, stabilized and considered medically ready for discharge. She has been noted to appear depressed and staff also reports frequent verbal arguments and clear tension between her and her husband when he is visiting her. Chaplain has been visiting her and providing support  For her and her husband. At the present time patient describes depression  which she relates mostly to marital tension. She denies physical abuse, but states that they do have frequent arguments, particularly about finances and about her decision to help her adult daughter out financially. Patient feels criticized by husband. She states "  He makes me upset, but I don't want to leave him, I want [place] to work it out, I want to stay married ".  She states that a few days prior to admission she was feeling "OK", and denies chronic depression  Or any recent neuro-vegetative symptoms of depression. Patient describes no formal psychiatric history , but does state she has been feeling depressed and anxious, which she attributes to psychosocial stressors as above. She has never attempted suicide, she denies any history of mania, any history of psychosis, and at this time denies any history of self injurious behaviors, or any history of anger or explosiveness. She states she has never been on any psychiatric medications. She denies alcohol or drug abuse. With regards to upcoming discharge, patient is aware of her neurological condition, risks associated with seizures, importance of  Complying  with anti-seizure medications and with neurology outpatient appointments. She appears fully alert , attentive, and there is no current evidence of delirium or confusion. At this time patient is not suicidal , not homicidal , not psychotic , and is future oriented, hoping she will be able to go back to work soon. She states she is willing to start an antidepressant medication, wants to go for outpatient therapy, and is hoping her  husband would agree to couples therapy.  HPI Elements:   Acute , significant Depression  In the context of seizure disorder  And marital tension.   Past Psychiatric History: Past Medical History  Diagnosis Date  . GERD (gastroesophageal reflux disease)   . Hyperlipidemia   . History of blood transfusion   . Chronic headaches   . Phlebitis   . History of colonic polyps    . 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 Tonic clonic seizures   . Anxiety     "situational stress"    reports that she has never smoked. She has never used smokeless tobacco. She reports that she does not drink alcohol or use illicit drugs. Family History  Problem Relation Age of Onset  . Diabetes Mother   . Hyperlipidemia Mother   . Stroke Mother   . Hypertension Mother   . Colon cancer Mother   . Breast cancer Mother   . Early death Father   . Heart attack Father   . Heart disease Father   . Hyperlipidemia Father   . COPD Sister            Allergies:   Allergies  Allergen Reactions  . Hepatitis B Vaccine Hives and Shortness Of Breath    Causes wheezing   . Imitrex [Sumatriptan] Other (See Comments)    Makes her have SVT and chest pain  . Iohexol Anaphylaxis, Hives and Shortness Of Breath  . Codeine Itching and Rash  . Keppra Xr [Levetiracetam] Nausea Only     Objective: Blood pressure 127/63, pulse 76, temperature 99.7 F (37.6 C), temperature source Oral, resp. rate 14, height _0  (1.702 m), weight 82.2 kg (181 lb 3.5 oz), SpO2 100.00%.Body mass index is 28.38 kg/(m^2). Results for orders placed during the hospital encounter of 08/31/14 (from the past 72 hour(s))  VALPROIC ACID LEVEL     Status: Abnormal   Collection Time    08/31/14  4:29 PM      Result Value Ref Range   Valproic Acid Lvl 197.6 (*) 50.0 - 100.0 ug/mL   Comment: RESULTS CONFIRMED BY MANUAL DILUTION     CRITICAL RESULT CALLED TO, READ BACK BY AND VERIFIED WITH:     S SYNDER,RN 1818 08/31/14 WBOND  BASIC METABOLIC PANEL     Status: None   Collection Time    08/31/14  4:29 PM      Result Value Ref Range   Sodium 142  137 - 147 mEq/L   Potassium 4.2  3.7 - 5.3 mEq/L   Chloride 103  96 - 112 mEq/L   CO2 25  19 - 32 mEq/L   Glucose, Bld 83  70 - 99 mg/dL   BUN 13  6 - 23 mg/dL   Creatinine, Ser 0.76  0.50 - 1.10 mg/dL   Calcium 8.9  8.4 - 10.5 mg/dL   GFR calc non Af Amer >90  >90  mL/min   GFR calc Af Amer >90  >90 mL/min   Comment: (NOTE)     The eGFR has been calculated using the CKD EPI equation.     This calculation has not been validated in all clinical situations.     eGFR's persistently <90 mL/min signify possible Chronic Kidney     Disease.   Anion gap 14  5 - 15  CBC     Status: None   Collection Time    08/31/14  4:29 PM      Result  Value Ref Range   WBC 4.4  4.0 - 10.5 K/uL   RBC 3.87  3.87 - 5.11 MIL/uL   Hemoglobin 12.4  12.0 - 15.0 g/dL   HCT 36.3  36.0 - 46.0 %   MCV 93.8  78.0 - 100.0 fL   MCH 32.0  26.0 - 34.0 pg   MCHC 34.2  30.0 - 36.0 g/dL   RDW 13.9  11.5 - 15.5 %   Platelets 207  150 - 400 K/uL  DIFFERENTIAL     Status: None   Collection Time    08/31/14  4:29 PM      Result Value Ref Range   Neutrophils Relative % 58  43 - 77 %   Neutro Abs 2.4  1.7 - 7.7 K/uL   Lymphocytes Relative 30  12 - 46 %   Lymphs Abs 1.3  0.7 - 4.0 K/uL   Monocytes Relative 7  3 - 12 %   Monocytes Absolute 0.3  0.1 - 1.0 K/uL   Eosinophils Relative 4  0 - 5 %   Eosinophils Absolute 0.2  0.0 - 0.7 K/uL   Basophils Relative 1  0 - 1 %   Basophils Absolute 0.0  0.0 - 0.1 K/uL  URINALYSIS, ROUTINE W REFLEX MICROSCOPIC     Status: Abnormal   Collection Time    08/31/14  5:10 PM      Result Value Ref Range   Color, Urine YELLOW  YELLOW   APPearance CLEAR  CLEAR   Specific Gravity, Urine 1.025  1.005 - 1.030   pH 6.0  5.0 - 8.0   Glucose, UA NEGATIVE  NEGATIVE mg/dL   Hgb urine dipstick NEGATIVE  NEGATIVE   Bilirubin Urine NEGATIVE  NEGATIVE   Ketones, ur 15 (*) NEGATIVE mg/dL   Protein, ur NEGATIVE  NEGATIVE mg/dL   Urobilinogen, UA 1.0  0.0 - 1.0 mg/dL   Nitrite NEGATIVE  NEGATIVE   Leukocytes, UA NEGATIVE  NEGATIVE   Comment: MICROSCOPIC NOT DONE ON URINES WITH NEGATIVE PROTEIN, BLOOD, LEUKOCYTES, NITRITE, OR GLUCOSE <1000 mg/dL.  URINE RAPID DRUG SCREEN (HOSP PERFORMED)     Status: Abnormal   Collection Time    08/31/14  5:10 PM      Result  Value Ref Range   Opiates NONE DETECTED  NONE DETECTED   Cocaine NONE DETECTED  NONE DETECTED   Benzodiazepines POSITIVE (*) NONE DETECTED   Amphetamines NONE DETECTED  NONE DETECTED   Tetrahydrocannabinol NONE DETECTED  NONE DETECTED   Barbiturates NONE DETECTED  NONE DETECTED   Comment:            DRUG SCREEN FOR MEDICAL PURPOSES     ONLY.  IF CONFIRMATION IS NEEDED     FOR ANY PURPOSE, NOTIFY LAB     WITHIN 5 DAYS.                LOWEST DETECTABLE LIMITS     FOR URINE DRUG SCREEN     Drug Class       Cutoff (ng/mL)     Amphetamine      1000     Barbiturate      200     Benzodiazepine   409     Tricyclics       811     Opiates          300     Cocaine          300     THC  50  I-STAT ARTERIAL BLOOD GAS, ED     Status: Abnormal   Collection Time    08/31/14  6:46 PM      Result Value Ref Range   pH, Arterial 7.437  7.350 - 7.450   pCO2 arterial 38.0  35.0 - 45.0 mmHg   pO2, Arterial 363.0 (*) 80.0 - 100.0 mmHg   Bicarbonate 25.6 (*) 20.0 - 24.0 mEq/L   TCO2 27  0 - 100 mmol/L   O2 Saturation 100.0     Acid-Base Excess 1.0  0.0 - 2.0 mmol/L   Patient temperature 98.7 F     Collection site RADIAL, ALLEN'S TEST ACCEPTABLE     Drawn by Operator     Sample type ARTERIAL    MRSA PCR SCREENING     Status: None   Collection Time    08/31/14  8:15 PM      Result Value Ref Range   MRSA by PCR NEGATIVE  NEGATIVE   Comment:            The GeneXpert MRSA Assay (FDA     approved for NASAL specimens     only), is one component of a     comprehensive MRSA colonization     surveillance program. It is not     intended to diagnose MRSA     infection nor to guide or     monitor treatment for     MRSA infections.  GLUCOSE, CAPILLARY     Status: None   Collection Time    08/31/14  8:27 PM      Result Value Ref Range   Glucose-Capillary 73  70 - 99 mg/dL  TRIGLYCERIDES     Status: None   Collection Time    08/31/14  9:24 PM      Result Value Ref Range    Triglycerides 130  <150 mg/dL  GLUCOSE, CAPILLARY     Status: Abnormal   Collection Time    08/31/14 11:48 PM      Result Value Ref Range   Glucose-Capillary 48 (*) 70 - 99 mg/dL   Comment 1 Documented in Chart     Comment 2 Notify RN    GLUCOSE, CAPILLARY     Status: Abnormal   Collection Time    09/01/14 12:33 AM      Result Value Ref Range   Glucose-Capillary 101 (*) 70 - 99 mg/dL  GLUCOSE, CAPILLARY     Status: Abnormal   Collection Time    09/01/14  3:16 AM      Result Value Ref Range   Glucose-Capillary 63 (*) 70 - 99 mg/dL   Comment 1 Documented in Chart     Comment 2 Notify RN    GLUCOSE, CAPILLARY     Status: None   Collection Time    09/01/14  5:00 AM      Result Value Ref Range   Glucose-Capillary 82  70 - 99 mg/dL   Comment 1 Documented in Chart     Comment 2 Notify RN    COMPREHENSIVE METABOLIC PANEL     Status: Abnormal   Collection Time    09/01/14  6:15 AM      Result Value Ref Range   Sodium 145  137 - 147 mEq/L   Potassium 3.2 (*) 3.7 - 5.3 mEq/L   Comment: DELTA CHECK NOTED   Chloride 112  96 - 112 mEq/L   Comment: DELTA CHECK NOTED   CO2 23  19 -  32 mEq/L   Glucose, Bld 76  70 - 99 mg/dL   BUN 9  6 - 23 mg/dL   Creatinine, Ser 0.59  0.50 - 1.10 mg/dL   Calcium 7.7 (*) 8.4 - 10.5 mg/dL   Total Protein 5.1 (*) 6.0 - 8.3 g/dL   Albumin 2.8 (*) 3.5 - 5.2 g/dL   AST 16  0 - 37 U/L   ALT 9  0 - 35 U/L   Alkaline Phosphatase 51  39 - 117 U/L   Total Bilirubin 0.3  0.3 - 1.2 mg/dL   GFR calc non Af Amer >90  >90 mL/min   GFR calc Af Amer >90  >90 mL/min   Comment: (NOTE)     The eGFR has been calculated using the CKD EPI equation.     This calculation has not been validated in all clinical situations.     eGFR's persistently <90 mL/min signify possible Chronic Kidney     Disease.   Anion gap 10  5 - 15  MAGNESIUM     Status: None   Collection Time    09/01/14  6:15 AM      Result Value Ref Range   Magnesium 1.8  1.5 - 2.5 mg/dL  CBC     Status:  Abnormal   Collection Time    09/01/14  6:15 AM      Result Value Ref Range   WBC 4.4  4.0 - 10.5 K/uL   RBC 2.85 (*) 3.87 - 5.11 MIL/uL   Hemoglobin 9.4 (*) 12.0 - 15.0 g/dL   Comment: DELTA CHECK NOTED     REPEATED TO VERIFY   HCT 27.0 (*) 36.0 - 46.0 %   MCV 94.7  78.0 - 100.0 fL   MCH 33.0  26.0 - 34.0 pg   MCHC 34.8  30.0 - 36.0 g/dL   RDW 14.1  11.5 - 15.5 %   Platelets 137 (*) 150 - 400 K/uL   Comment: DELTA CHECK NOTED     REPEATED TO VERIFY  GLUCOSE, CAPILLARY     Status: Abnormal   Collection Time    09/01/14  7:59 AM      Result Value Ref Range   Glucose-Capillary 63 (*) 70 - 99 mg/dL  GLUCOSE, CAPILLARY     Status: None   Collection Time    09/01/14  8:38 AM      Result Value Ref Range   Glucose-Capillary 96  70 - 99 mg/dL  GLUCOSE, CAPILLARY     Status: None   Collection Time    09/01/14 12:11 PM      Result Value Ref Range   Glucose-Capillary 72  70 - 99 mg/dL  GLUCOSE, CAPILLARY     Status: Abnormal   Collection Time    09/01/14  3:45 PM      Result Value Ref Range   Glucose-Capillary 68 (*) 70 - 99 mg/dL  GLUCOSE, CAPILLARY     Status: None   Collection Time    09/01/14  7:43 PM      Result Value Ref Range   Glucose-Capillary 95  70 - 99 mg/dL  GLUCOSE, CAPILLARY     Status: None   Collection Time    09/01/14 11:21 PM      Result Value Ref Range   Glucose-Capillary 94  70 - 99 mg/dL   Comment 1 Documented in Chart     Comment 2 Notify RN    GLUCOSE, CAPILLARY     Status:  Abnormal   Collection Time    09/02/14  3:43 AM      Result Value Ref Range   Glucose-Capillary 107 (*) 70 - 99 mg/dL   Comment 1 Documented in Chart     Comment 2 Notify RN    BASIC METABOLIC PANEL     Status: Abnormal   Collection Time    09/02/14  5:00 AM      Result Value Ref Range   Sodium 145  137 - 147 mEq/L   Potassium 3.6 (*) 3.7 - 5.3 mEq/L   Chloride 108  96 - 112 mEq/L   CO2 25  19 - 32 mEq/L   Glucose, Bld 103 (*) 70 - 99 mg/dL   BUN 4 (*) 6 - 23 mg/dL    Creatinine, Ser 0.60  0.50 - 1.10 mg/dL   Calcium 8.7  8.4 - 10.5 mg/dL   GFR calc non Af Amer >90  >90 mL/min   GFR calc Af Amer >90  >90 mL/min   Comment: (NOTE)     The eGFR has been calculated using the CKD EPI equation.     This calculation has not been validated in all clinical situations.     eGFR's persistently <90 mL/min signify possible Chronic Kidney     Disease.   Anion gap 12  5 - 15  CBC     Status: Abnormal   Collection Time    09/02/14  5:00 AM      Result Value Ref Range   WBC 5.6  4.0 - 10.5 K/uL   RBC 3.28 (*) 3.87 - 5.11 MIL/uL   Hemoglobin 10.5 (*) 12.0 - 15.0 g/dL   HCT 30.4 (*) 36.0 - 46.0 %   MCV 92.7  78.0 - 100.0 fL   MCH 32.0  26.0 - 34.0 pg   MCHC 34.5  30.0 - 36.0 g/dL   RDW 13.9  11.5 - 15.5 %   Platelets 170  150 - 400 K/uL  GLUCOSE, CAPILLARY     Status: Abnormal   Collection Time    09/02/14  7:50 AM      Result Value Ref Range   Glucose-Capillary 103 (*) 70 - 99 mg/dL   Labs are reviewed and are pertinent for  Elevated valproic acid serum level upon admission, anemia  Current Facility-Administered Medications  Medication Dose Route Frequency Provider Last Rate Last Dose  . chlorhexidine (PERIDEX) 0.12 % solution 15 mL  15 mL Mouth Rinse BID Chesley Mires, MD   15 mL at 09/02/14 0821  . dextrose 5 %-0.9 % sodium chloride infusion   Intravenous Continuous Kara Mead V, MD      . docusate (COLACE) 50 MG/5ML liquid 100 mg  100 mg Oral BID PRN Elsie Stain, MD       Or  . docusate (COLACE) 50 MG/5ML liquid 100 mg  100 mg Per Tube BID PRN Elsie Stain, MD      . fentaNYL (SUBLIMAZE) injection 50 mcg  50 mcg Intravenous Q1H PRN Guy Begin, MD   50 mcg at 09/01/14 0836  . heparin injection 5,000 Units  5,000 Units Subcutaneous 3 times per day Chesley Mires, MD   5,000 Units at 09/01/14 2245  . insulin aspart (novoLOG) injection 2-6 Units  2-6 Units Subcutaneous 6 times per day Elsie Stain, MD      . lacosamide (VIMPAT) 100 mg in  sodium chloride 0.9 % 25 mL IVPB  100 mg Intravenous Q12H Kelby Aline,  MD   100 mg at 09/02/14 1024  . midazolam (VERSED) injection 2 mg  2 mg Intravenous Q2H PRN Chesley Mires, MD      . pantoprazole sodium (PROTONIX) 40 mg/20 mL oral suspension 40 mg  40 mg Per Tube Q1200 Kara Mead V, MD   40 mg at 09/01/14 1226  . propofol (DIPRIVAN) 10 mg/ml infusion  0-50 mcg/kg/min Intravenous Continuous Chesley Mires, MD   10 mcg/kg/min at 09/01/14 1430    Psychiatric Specialty Exam:     Blood pressure 127/63, pulse 76, temperature 99.7 F (37.6 C), temperature source Oral, resp. rate 14, height _0  (1.702 m), weight 82.2 kg (181 lb 3.5 oz), SpO2 100.00%.Body mass index is 28.38 kg/(m^2).  General Appearance: Fairly Groomed  Engineer, water::  Good  Speech:  Normal Rate  Volume:  Normal  Mood:  Depressed  Affect:  constricted but reactive  Thought Process:  Goal Directed and Linear  Orientation:  Other:  fully alert and attentive   Thought Content:  denies hallucinations, no delusions, does not appear interanlly preoccupied   Suicidal Thoughts:  No- denies any suicidal or homicidal ideations, denies any thoughts of violence towards husband.  Homicidal Thoughts:  No  Memory:  remote and recent grossly intact- some amnesia of peri ictal periods    Judgement:  Fair  Insight:  Fair  Psychomotor Activity:  Normal  Concentration:  Good  Recall:  Good  Fund of Knowledge:Good  Language: Good  Akathisia:  Negative  Handed:  Right  AIMS (if indicated):     Assets:  Desire for Improvement Resilience Vocational/Educational  Sleep:      Musculoskeletal: Strength & Muscle Tone: within normal limits Gait & Station: NA- patient in bed, gait not examined Patient leans: N/A  Treatment Plan Summary: 1. At this time no grounds for involuntary commitment . Patient is looking forward to discharge from hospital later today. 2.  An antidepressant medication  Is warranted, patient agrees.  Patient agreeing   With   ZOLOFT , to start at 50 mgrs Daily at first.   Note that combination of Zoloft and Depakote can potentially increase risk of hyponatremia, so  Would consider periodic monitoring .  3. Patient would benefit from outpatient psychiatric management, psychotherapy, and couples therapy with husband, although she states she is unsure if husband would agree to it. I have contacted Education officer, museum, who will come see patient prior to discharge to provide referral options for the above. 4. At this time I do think that patient has capacity to participate in decision making regarding discharge planning  Clairissa Valvano, [facility] 09/02/2014 2:55 PM

## 2014-09-02 NOTE — Discharge Instructions (Signed)
Please note that NO REFILLS for any discharge medications will be authorized once you are discharged, as it is imperative that you return to your primary care physician (or establish a relationship with a primary care physician if you do not have one) for your aftercare needs so that they can reassess your need for medications and monitor your lab values.   1.  Review your medications carefully as they have changed.  2.  Take your medications as prescribed.  3.  Call to arranged outpatient counseling for yourself and couples counseling.

## 2014-09-04 LAB — TOPIRAMATE LEVEL

## 2014-09-09 ENCOUNTER — Encounter: Payer: Self-pay | Admitting: Internal Medicine

## 2014-09-12 ENCOUNTER — Encounter: Payer: Self-pay | Admitting: Internal Medicine

## 2014-09-12 ENCOUNTER — Telehealth: Payer: Self-pay | Admitting: Neurology

## 2014-09-12 ENCOUNTER — Telehealth: Payer: Self-pay

## 2014-09-12 MED ORDER — HYDROCODONE-ACETAMINOPHEN 5-325 MG PO TABS: 1.0000 | ORAL_TABLET | Freq: Three times a day (TID) | ORAL | Status: DC | PRN

## 2014-09-12 NOTE — Telephone Encounter (Signed)
Pt called to cancel her f/u appt for tomorrow 09/13/14. SHe stated that she has a conflict with her schedule.  She will call once she has her calender to r/s.

## 2014-09-12 NOTE — Telephone Encounter (Signed)
Rx has been printed and placed in your inbox for signature--pt came in to pick up Rx but told her to redo [employer] contract and can pick up Rx tomorrow

## 2014-09-13 ENCOUNTER — Ambulatory Visit: Payer: Federal, State, Local not specified - PPO | Admitting: Neurology

## 2014-09-13 MED ORDER — HYDROCODONE-ACETAMINOPHEN 5-325 MG PO TABS: 1.0000 | ORAL_TABLET | Freq: Three times a day (TID) | ORAL | Status: DC | PRN

## 2014-09-13 NOTE — Telephone Encounter (Signed)
Rx left in front office for pick up and pt is aware  

## 2014-09-13 NOTE — Addendum Note (Signed)
Addended by: Roena MaladyEVONTENNO, Zsazsa Bahena Y on: 09/13/2014 08:30 AM   Modules accepted: Orders

## 2014-09-13 NOTE — Telephone Encounter (Signed)
Ok, will sign and give back to you

## 2014-10-02 ENCOUNTER — Encounter: Payer: Self-pay | Admitting: Internal Medicine

## 2014-10-07 ENCOUNTER — Encounter: Payer: Self-pay | Admitting: Internal Medicine

## 2014-10-08 ENCOUNTER — Encounter: Payer: Self-pay | Admitting: Internal Medicine

## 2014-10-09 ENCOUNTER — Other Ambulatory Visit: Payer: Self-pay

## 2014-10-09 ENCOUNTER — Encounter: Payer: Self-pay | Admitting: Internal Medicine

## 2014-10-09 MED ORDER — ALPRAZOLAM 0.5 MG PO TABS: 0.5000 mg | ORAL_TABLET | Freq: Three times a day (TID) | ORAL | Status: DC | PRN

## 2014-10-09 MED ORDER — GABAPENTIN 300 MG PO CAPS: 900.0000 mg | ORAL_CAPSULE | Freq: Three times a day (TID) | ORAL | Status: DC

## 2014-10-09 NOTE — Telephone Encounter (Signed)
Rx sent through e-scribe and Rx called into pharmacy Per Nicki Reaperegina Baity request as instructed

## 2014-10-14 ENCOUNTER — Encounter: Payer: Self-pay | Admitting: Internal Medicine

## 2014-10-15 ENCOUNTER — Other Ambulatory Visit: Payer: Self-pay

## 2014-10-15 ENCOUNTER — Other Ambulatory Visit: Payer: Self-pay | Admitting: Internal Medicine

## 2014-10-15 MED ORDER — HYDROCODONE-ACETAMINOPHEN 5-325 MG PO TABS: 1.0000 | ORAL_TABLET | Freq: Three times a day (TID) | ORAL | Status: DC | PRN

## 2014-10-16 ENCOUNTER — Encounter: Payer: Self-pay | Admitting: Internal Medicine

## 2014-10-17 MED ORDER — GABAPENTIN 300 MG PO CAPS: 900.0000 mg | ORAL_CAPSULE | Freq: Three times a day (TID) | ORAL | Status: DC

## 2014-10-18 ENCOUNTER — Encounter: Payer: Self-pay | Admitting: Internal Medicine

## 2014-10-19 ENCOUNTER — Encounter: Payer: Self-pay | Admitting: Internal Medicine

## 2014-10-19 NOTE — Telephone Encounter (Signed)
I called the pharmacy and they stated she did pick up Rx for Xanax on 10/09/14 but the pharmacy states there was only #30 called in, i stated wed had not called it in for 30 however they state that is what they had in the system--I called in the additional #60 pills--so problem has been resolved--we continue to have problems with this [facility] not sure what to do--i will speak to pharmacist from now on for call ins here

## 2014-10-22 ENCOUNTER — Other Ambulatory Visit: Payer: Self-pay | Admitting: Internal Medicine

## 2014-10-22 ENCOUNTER — Encounter: Payer: Self-pay | Admitting: Internal Medicine

## 2014-10-22 MED ORDER — ZOLPIDEM TARTRATE 10 MG PO TABS: 10.0000 mg | ORAL_TABLET | Freq: Every evening | ORAL | Status: DC | PRN

## 2014-10-22 MED ORDER — PROMETHAZINE HCL 50 MG PO TABS: 25.0000 mg | ORAL_TABLET | Freq: Four times a day (QID) | ORAL | Status: DC | PRN

## 2014-10-23 ENCOUNTER — Inpatient Hospital Stay (HOSPITAL_COMMUNITY)
Admission: EM | Admit: 2014-10-23 | Discharge: 2014-10-26 | DRG: 125 | Disposition: A | Discharge status: Home / Self Care | Payer: Federal, State, Local not specified - PPO | Attending: Internal Medicine | Admitting: Internal Medicine

## 2014-10-23 ENCOUNTER — Telehealth: Payer: Self-pay

## 2014-10-23 ENCOUNTER — Other Ambulatory Visit: Payer: Self-pay | Admitting: Internal Medicine

## 2014-10-23 ENCOUNTER — Emergency Department (HOSPITAL_COMMUNITY): Payer: Federal, State, Local not specified - PPO

## 2014-10-23 ENCOUNTER — Encounter (HOSPITAL_COMMUNITY): Payer: Self-pay | Admitting: Emergency Medicine

## 2014-10-23 DIAGNOSIS — Z79899 Other long term (current) drug therapy: Secondary | ICD-10-CM

## 2014-10-23 DIAGNOSIS — H5702 Anisocoria: Secondary | ICD-10-CM

## 2014-10-23 DIAGNOSIS — R51 Headache: Secondary | ICD-10-CM | POA: Diagnosis present

## 2014-10-23 DIAGNOSIS — H547 Unspecified visual loss: Secondary | ICD-10-CM | POA: Diagnosis not present

## 2014-10-23 DIAGNOSIS — H53139 Sudden visual loss, unspecified eye: Secondary | ICD-10-CM | POA: Diagnosis present

## 2014-10-23 DIAGNOSIS — R569 Unspecified convulsions: Secondary | ICD-10-CM

## 2014-10-23 DIAGNOSIS — F419 Anxiety disorder, unspecified: Secondary | ICD-10-CM | POA: Diagnosis present

## 2014-10-23 DIAGNOSIS — R531 Weakness: Secondary | ICD-10-CM | POA: Diagnosis not present

## 2014-10-23 DIAGNOSIS — H538 Other visual disturbances: Secondary | ICD-10-CM | POA: Diagnosis present

## 2014-10-23 DIAGNOSIS — G629 Polyneuropathy, unspecified: Secondary | ICD-10-CM | POA: Diagnosis present

## 2014-10-23 DIAGNOSIS — E785 Hyperlipidemia, unspecified: Secondary | ICD-10-CM | POA: Diagnosis present

## 2014-10-23 DIAGNOSIS — Z7952 Long term (current) use of systemic steroids: Secondary | ICD-10-CM

## 2014-10-23 DIAGNOSIS — Z981 Arthrodesis status: Secondary | ICD-10-CM

## 2014-10-23 DIAGNOSIS — G4089 Other seizures: Secondary | ICD-10-CM | POA: Diagnosis present

## 2014-10-23 DIAGNOSIS — K219 Gastro-esophageal reflux disease without esophagitis: Secondary | ICD-10-CM | POA: Diagnosis present

## 2014-10-23 DIAGNOSIS — H5461 Unqualified visual loss, right eye, normal vision left eye: Secondary | ICD-10-CM | POA: Diagnosis not present

## 2014-10-23 DIAGNOSIS — M352 Behcet's disease: Secondary | ICD-10-CM | POA: Diagnosis present

## 2014-10-23 HISTORY — DX: Calculus of kidney: N20.0

## 2014-10-23 LAB — CBC WITH DIFFERENTIAL/PLATELET
Basophils Absolute: 0 10*3/uL (ref 0.0–0.1)
Basophils Relative: 0 % (ref 0–1)
EOS ABS: 0.2 10*3/uL (ref 0.0–0.7)
Eosinophils Relative: 3 % (ref 0–5)
HEMATOCRIT: 35 % — AB (ref 36.0–46.0)
Hemoglobin: 12 g/dL (ref 12.0–15.0)
LYMPHS PCT: 24 % (ref 12–46)
Lymphs Abs: 1.9 10*3/uL (ref 0.7–4.0)
MCH: 32.2 pg (ref 26.0–34.0)
MCHC: 34.3 g/dL (ref 30.0–36.0)
MCV: 93.8 fL (ref 78.0–100.0)
MONO ABS: 0.4 10*3/uL (ref 0.1–1.0)
Monocytes Relative: 5 % (ref 3–12)
Neutro Abs: 5.2 10*3/uL (ref 1.7–7.7)
Neutrophils Relative %: 68 % (ref 43–77)
Platelets: 268 10*3/uL (ref 150–400)
RBC: 3.73 MIL/uL — ABNORMAL LOW (ref 3.87–5.11)
RDW: 12.1 % (ref 11.5–15.5)
WBC: 7.7 10*3/uL (ref 4.0–10.5)

## 2014-10-23 LAB — BASIC METABOLIC PANEL
Anion gap: 16 — ABNORMAL HIGH (ref 5–15)
BUN: 10 mg/dL (ref 6–23)
CO2: 19 meq/L (ref 19–32)
CREATININE: 0.64 mg/dL (ref 0.50–1.10)
Calcium: 9.4 mg/dL (ref 8.4–10.5)
Chloride: 104 mEq/L (ref 96–112)
GFR calc Af Amer: >90 mL/min (ref >90)
Glucose, Bld: 91 mg/dL (ref 70–99)
Potassium: 4.3 mEq/L (ref 3.7–5.3)
Sodium: 139 mEq/L (ref 137–147)

## 2014-10-23 LAB — TSH: TSH: 0.725 u[IU]/mL (ref 0.350–4.500)

## 2014-10-23 LAB — SEDIMENTATION RATE: Sed Rate: 20 mm/hr (ref 0–22)

## 2014-10-23 LAB — C-REACTIVE PROTEIN: CRP: <0.5 mg/dL — ABNORMAL LOW (ref <0.60)

## 2014-10-23 MED ORDER — MORPHINE SULFATE 2 MG/ML IJ SOLN
1.0000 mg | INTRAMUSCULAR | Status: DC | PRN
  Administered 2014-10-23 – 2014-10-24 (×6): 1 mg via INTRAVENOUS
  Filled 2014-10-23 (×6): qty 1

## 2014-10-23 MED ORDER — BISACODYL 10 MG RE SUPP: 10.0000 mg | Freq: Every day | RECTAL | Status: DC | PRN

## 2014-10-23 MED ORDER — CYCLOPHOSPHAMIDE 25 MG PO CAPS
75.0000 mg | ORAL_CAPSULE | Freq: Every day | ORAL | Status: DC
  Administered 2014-10-23 – 2014-10-25 (×2): 75 mg via ORAL
  Filled 2014-10-23 (×4): qty 3

## 2014-10-23 MED ORDER — CYCLOPHOSPHAMIDE 25 MG PO CAPS
75.0000 mg | ORAL_CAPSULE | Freq: Every day | ORAL | Status: DC
  Filled 2014-10-23: qty 3

## 2014-10-23 MED ORDER — ADULT MULTIVITAMIN W/MINERALS CH
1.0000 | ORAL_TABLET | Freq: Every day | ORAL | Status: DC
  Administered 2014-10-24 – 2014-10-26 (×3): 1 via ORAL
  Filled 2014-10-23 (×3): qty 1

## 2014-10-23 MED ORDER — ONDANSETRON HCL 4 MG/2ML IJ SOLN
4.0000 mg | Freq: Four times a day (QID) | INTRAMUSCULAR | Status: DC | PRN
  Administered 2014-10-24 – 2014-10-25 (×5): 4 mg via INTRAVENOUS
  Filled 2014-10-23 (×6): qty 2

## 2014-10-23 MED ORDER — FENTANYL CITRATE 0.05 MG/ML IJ SOLN
50.0000 ug | Freq: Once | INTRAMUSCULAR | Status: AC
  Administered 2014-10-23: 50 ug via INTRAVENOUS
  Filled 2014-10-23: qty 2

## 2014-10-23 MED ORDER — CYCLOPHOSPHAMIDE 50 MG PO TABS: 75.0000 mg | ORAL_TABLET | Freq: Every day | ORAL | Status: DC

## 2014-10-23 MED ORDER — SODIUM CHLORIDE 0.9 % IV SOLN
250.0000 mg | Freq: Four times a day (QID) | INTRAVENOUS | Status: DC
  Administered 2014-10-23 – 2014-10-25 (×7): 250 mg via INTRAVENOUS
  Filled 2014-10-23 (×9): qty 2

## 2014-10-23 MED ORDER — OXYCODONE HCL 5 MG PO TABS
5.0000 mg | ORAL_TABLET | ORAL | Status: DC | PRN
  Administered 2014-10-24 – 2014-10-26 (×5): 5 mg via ORAL
  Filled 2014-10-23 (×6): qty 1

## 2014-10-23 MED ORDER — DIVALPROEX SODIUM 500 MG PO DR TAB
500.0000 mg | DELAYED_RELEASE_TABLET | Freq: Three times a day (TID) | ORAL | Status: DC
  Administered 2014-10-23 – 2014-10-24 (×3): 500 mg via ORAL
  Filled 2014-10-23 (×4): qty 1

## 2014-10-23 MED ORDER — DOCUSATE SODIUM 100 MG PO CAPS
100.0000 mg | ORAL_CAPSULE | Freq: Two times a day (BID) | ORAL | Status: DC
  Administered 2014-10-23 – 2014-10-26 (×5): 100 mg via ORAL
  Filled 2014-10-23 (×6): qty 1

## 2014-10-23 MED ORDER — FOLIC ACID 1 MG PO TABS
1.0000 mg | ORAL_TABLET | Freq: Every day | ORAL | Status: DC
  Administered 2014-10-24 – 2014-10-26 (×3): 1 mg via ORAL
  Filled 2014-10-23 (×3): qty 1

## 2014-10-23 MED ORDER — ALUM & MAG HYDROXIDE-SIMETH 200-200-20 MG/5ML PO SUSP: 30.0000 mL | Freq: Four times a day (QID) | ORAL | Status: DC | PRN

## 2014-10-23 MED ORDER — HYDROCODONE-ACETAMINOPHEN 5-325 MG PO TABS: 1.0000 | ORAL_TABLET | Freq: Three times a day (TID) | ORAL | Status: DC | PRN

## 2014-10-23 MED ORDER — ASPIRIN EC 325 MG PO TBEC
325.0000 mg | DELAYED_RELEASE_TABLET | Freq: Every day | ORAL | Status: DC
  Administered 2014-10-23 – 2014-10-26 (×4): 325 mg via ORAL
  Filled 2014-10-23 (×4): qty 1

## 2014-10-23 MED ORDER — ZOLPIDEM TARTRATE 5 MG PO TABS
5.0000 mg | ORAL_TABLET | Freq: Every evening | ORAL | Status: DC | PRN
  Administered 2014-10-23 – 2014-10-25 (×3): 5 mg via ORAL
  Filled 2014-10-23 (×3): qty 1

## 2014-10-23 MED ORDER — GABAPENTIN 300 MG PO CAPS
900.0000 mg | ORAL_CAPSULE | Freq: Three times a day (TID) | ORAL | Status: DC
  Administered 2014-10-23 – 2014-10-26 (×7): 900 mg via ORAL
  Filled 2014-10-23 (×8): qty 3

## 2014-10-23 MED ORDER — SODIUM CHLORIDE 0.9 % IV SOLN
INTRAVENOUS | Status: DC
  Administered 2014-10-23 – 2014-10-26 (×2): via INTRAVENOUS

## 2014-10-23 MED ORDER — POLYETHYLENE GLYCOL 3350 17 G PO PACK: 17.0000 g | PACK | Freq: Every day | ORAL | Status: DC | PRN

## 2014-10-23 MED ORDER — LORAZEPAM 2 MG/ML IJ SOLN
2.0000 mg | Freq: Once | INTRAMUSCULAR | Status: AC
  Administered 2014-10-23: 2 mg via INTRAVENOUS
  Filled 2014-10-23: qty 1

## 2014-10-23 MED ORDER — LEVETIRACETAM 750 MG PO TABS
1500.0000 mg | ORAL_TABLET | Freq: Two times a day (BID) | ORAL | Status: DC
  Administered 2014-10-23 – 2014-10-24 (×2): 1500 mg via ORAL
  Filled 2014-10-23 (×2): qty 2
  Filled 2014-10-23: qty 6

## 2014-10-23 MED ORDER — HEPARIN SODIUM (PORCINE) 5000 UNIT/ML IJ SOLN
5000.0000 [IU] | Freq: Three times a day (TID) | INTRAMUSCULAR | Status: DC
  Administered 2014-10-23 – 2014-10-26 (×9): 5000 [IU] via SUBCUTANEOUS
  Filled 2014-10-23 (×9): qty 1

## 2014-10-23 MED ORDER — ONDANSETRON HCL 4 MG PO TABS: 4.0000 mg | ORAL_TABLET | Freq: Four times a day (QID) | ORAL | Status: DC | PRN

## 2014-10-23 MED ORDER — ALPRAZOLAM 0.5 MG PO TABS
0.5000 mg | ORAL_TABLET | Freq: Three times a day (TID) | ORAL | Status: DC | PRN
  Administered 2014-10-24 – 2014-10-26 (×4): 0.5 mg via ORAL
  Filled 2014-10-23 (×5): qty 1

## 2014-10-23 MED ORDER — SULFAMETHOXAZOLE-TRIMETHOPRIM 800-160 MG PO TABS
1.0000 | ORAL_TABLET | ORAL | Status: DC
  Administered 2014-10-24 – 2014-10-26 (×2): 1 via ORAL
  Filled 2014-10-23 (×2): qty 1

## 2014-10-23 MED ORDER — VITAMIN B-1 100 MG PO TABS
100.0000 mg | ORAL_TABLET | Freq: Every day | ORAL | Status: DC
  Administered 2014-10-24 – 2014-10-26 (×3): 100 mg via ORAL
  Filled 2014-10-23 (×3): qty 1

## 2014-10-23 MED ORDER — MAGNESIUM CITRATE PO SOLN
1.0000 | Freq: Once | ORAL | Status: AC | PRN
  Filled 2014-10-23: qty 296

## 2014-10-23 NOTE — ED Notes (Signed)
Phlebotomy at bedside to redraw labs. Dr. Criss AlvineGoldston requesting respiratory arterial stick for labs if unsuccessful.

## 2014-10-23 NOTE — ED Provider Notes (Signed)
CSN: [id number]     Arrival date & time 10/23/14  1246 History   First MD Initiated Contact with Patient 10/23/14 1433     Chief Complaint  Patient presents with  . Blurred Vision     (Consider location/radiation/quality/duration/timing/severity/associated sxs/prior Treatment) HPI  53 year old female with a history of Behcet's disease presents with acute blurry vision since last night. She states for the past 4 days she's been having atypical headache that she gets fairly often. Last night she was turning in her computer screen and knows that her right eye transiently had decreased vision for approximately 2 minutes. Patient denies any weakness or numbness in her extremity. No neck pain or neck injuries. No facial weakness or trouble speaking. She's not noticed increased or decreased sweating. 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 She's currently on low-dose prednisone daily. She does have some eye pain when moving her right eye. She states the blurry vision is a "fuzziness". Denies double vision.  Past Medical History  Diagnosis Date  . GERD (gastroesophageal reflux disease)   . Hyperlipidemia   . History of blood transfusion   . Chronic headaches   . Phlebitis   . History of colonic polyps   . 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 Tonic clonic seizures   . Anxiety     "situational stress"   Past Surgical History  Procedure Laterality Date  . Cholecystectomy    . Tonsillectomy  1970  . Appendectomy  1975  . Lumbar fusion  1999    L5-S1  . Back surgery    . Abdominal hysterectomy  1988  . Portacath placement Left 2008  . Stone extraction with basket     Family History  Problem Relation Age of Onset  . Diabetes Mother   . Hyperlipidemia Mother   . Stroke Mother   . Hypertension Mother   . Colon cancer Mother   . Breast cancer Mother   . Early death Father   . Heart attack Father   . Heart  disease Father   . Hyperlipidemia Father   . COPD Sister    History  Substance Use Topics  . Smoking status: Never Smoker   . Smokeless tobacco: Never Used  . Alcohol Use: No   OB History    No data available     Review of Systems  Constitutional: Negative for fever.  Eyes: Positive for photophobia, pain and visual disturbance.  Neurological: Positive for headaches. Negative for weakness and numbness.  All other systems reviewed and are negative.     Allergies  Hepatitis b vaccine; Imitrex; Iohexol; Codeine; and Keppra xr  Home Medications   Prior to Admission medications   Medication Sig Start Date End Date Taking? Authorizing Provider  ALPRAZolam Prudy Feeler) 0.5 MG tablet Take 1 tablet (0.5 mg total) by mouth 3 (three) times daily as needed for anxiety. 10/09/14   Lorre Munroe, NP  cyclophosphamide (CYTOXAN) 25 MG tablet Take 75 mg by mouth daily. Give on an empty stomach 1 hour before or 2 hours after meals.    Historical Provider, MD  diazepam (VALIUM) 5 MG tablet Take 5 mg by mouth every 6 (six) hours as needed for anxiety.    Historical Provider, MD  divalproex (DEPAKOTE) 250 MG DR tablet Take 2 tablets (500 mg total) by mouth 3 (three) times daily. 09/02/14   Jeanella Craze, NP  gabapentin (NEURONTIN)  300 MG capsule Take 3 capsules (900 mg total) by mouth 3 (three) times daily. 10/17/14   Lorre Munroeegina W Baity, NP  HYDROcodone-acetaminophen (NORCO/VICODIN) 5-325 MG per tablet Take 1 tablet by mouth 3 (three) times daily as needed for moderate pain. 10/15/14   Lorre Munroeegina W Baity, NP  HYDROcodone-acetaminophen (NORCO/VICODIN) 5-325 MG per tablet Take 1 tablet by mouth 3 (three) times daily as needed for moderate pain. 10/15/14   Lorre Munroeegina W Baity, NP  levETIRAcetam (KEPPRA) 500 MG tablet Take 3 tablets (1,500 mg total) by mouth 2 (two) times daily. 09/02/14   Jeanella CrazeBrandi L Ollis, NP  promethazine (PHENERGAN) 50 MG tablet Take 0.5 tablets (25 mg total) by mouth every 6 (six) hours as needed for  nausea or vomiting. 10/22/14   Lorre Munroeegina W Baity, NP  sertraline (ZOLOFT) 50 MG tablet Take 1 tablet (50 mg total) by mouth daily. 09/02/14   Jeanella CrazeBrandi L Ollis, NP  zolpidem (AMBIEN) 10 MG tablet Take 1 tablet (10 mg total) by mouth at bedtime as needed for sleep. 10/22/14 11/21/14  Lorre Munroeegina W Baity, NP   BP 143/102 mmHg  Pulse 114  Temp(Src) 98.3 F (36.8 C) (Oral)  Resp 18  Ht 5' 10.5" (1.791 m)  Wt 176 lb (79.833 kg)  BMI 24.89 kg/m2  SpO2 100% Physical Exam  Constitutional: She is oriented to person, place, and time. She appears well-developed and well-nourished.  HENT:  Head: Normocephalic and atraumatic.  Right Ear: External ear normal.  Left Ear: External ear normal.  Nose: Nose normal.  Eyes: Conjunctivae and EOM are normal. Right eye exhibits no discharge. Left eye exhibits no discharge. Right pupil is not reactive. Left pupil is round and reactive. Pupils are unequal.  Left eye normal sized with normal reactivity. Right eye significantly dilated and not completely round. Does not react to light. Very photophobic, unable to get fundoscopic exam due to this. Decreased vision in right lateral field  Cardiovascular: Normal rate, regular rhythm and normal heart sounds.   Pulmonary/Chest: Effort normal and breath sounds normal.  Abdominal: Soft. She exhibits no distension. There is no tenderness.  Neurological: She is alert and oriented to person, place, and time.  CN 3-12 grossly intact. Slight decrease in strength in RUE/RLE compared to left but both 5/5  Skin: Skin is warm and dry.  Nursing note and vitals reviewed.   ED Course  Procedures (including critical care time) Labs Review Labs Reviewed - No data to display  Imaging Review Ct Head Wo Contrast  10/23/2014   CLINICAL DATA:  Five day history of headache. Intermittent blurred vision. Unequal pupils. Patient with Behcet's disease  EXAM: CT HEAD WITHOUT CONTRAST  TECHNIQUE: Contiguous axial images were obtained from the base of  the skull through the vertex without intravenous contrast.  COMPARISON:  August 31, 2014  FINDINGS: The ventricles are normal in size and configuration. There is no mass, hemorrhage, extra-axial fluid collection, or midline shift. Gray-white compartments are normal. There is no demonstrable acute infarct. Bony calvarium appears intact. The mastoid air cells are clear.  IMPRESSION: Study within normal limits. No intracranial mass, hemorrhage, or focal gray -white compartment lesions/acute appearing infarct.   Electronically Signed   By: Bretta BangWilliam  Woodruff M.D.   On: 10/23/2014 15:08     EKG Interpretation None      MDM   Final diagnoses:  Unequal pupils    Patient with acute dilation of her right pupil with subsequent blurry vision and photophobia. Has headache but not different than normal headaches.  CT head normal. Neuro consulted, they recommend MRIs and will likely need to start on steroids. Will need admission to medicine, hospitalist consulted.     Audree CamelScott T Gwenith Tschida, MD 10/23/14 [phone]

## 2014-10-23 NOTE — Telephone Encounter (Signed)
Brenda Mcguire with [facility] said pt had blurred vision in rt eye last night while on computer but did not really bother her; this morning blurred vision is worse to the point pt cannot see out of rt eye except blurred images; rt pupil is fully dilated , hurts to move rt eye and moderate h/a. Pt has Behcet's disease, hx of optic neuritis and pt is taking chemotherapy. [facility] disposition is ED. [facility] or [place]. Advised pt to go to ED for eval.

## 2014-10-23 NOTE — ED Notes (Signed)
Pt unsuccessful with MRI

## 2014-10-23 NOTE — Telephone Encounter (Signed)
Patient Information:  Caller Name: Burna MortimerWanda  Phone: [phone]  Patient: Brenda Mcguire, Ericah P  Gender: Female  DOB: 03-31-1961  Age: [age] Years  PCP: Nicki ReaperBaity, Regina  Pregnant: No  Office Follow Up:  Does the office need to follow up with this patient?: No  Instructions For The Office: N/A  RN Note:  Vision blurred in right eye since last night (10/22/14) and right eye is painful when moves eyeball. Generalized moderate headache present. Hx of Behcet's disease with optic neuritis.  MD at work told her she may need CT scan. Currently on chemotherapy. No appointments remain [facility] or [place].  Call provider now per nursing judgement for loss of vision in one eye with eye pain and fully dilated pupil. Disposition discussed with Rena/office triage nurse, who agreed pt should be evaluted in ED now.  [facility]  Verbalized understanding.  Symptoms  Reason For Call & Symptoms: Called from work.  On oral chemo for Behcet's disease.  Reports new onset of blurred vision right eye and right pupil is dilated.  History of optic neuritis.  Reviewed Health History In EMR: Yes  Reviewed Medications In EMR: Yes  Reviewed Allergies In EMR: Yes  Reviewed Surgeries / Procedures: Yes  Date of Onset of Symptoms: 10/22/2014 OB / GYN:  LMP: Unknown  Guideline(s) Used:  Eye Pain  Disposition Per Guideline:   Go to ED Now  Reason For Disposition Reached:   Complete loss of vision in one or both eyes  Advice Given:  N/A  RN Overrode Recommendation:  Go To ED  Per approval Rena/office nurse

## 2014-10-23 NOTE — Progress Notes (Signed)
Late Entry: Pt arrived to floor around 2011. Pt complaining of headache 9/10. Still having loss of vision in right eye, otherwise pt is doing well. Brenda OxfordJessica Marlette Curvin, RN 10/23/2014 2229

## 2014-10-23 NOTE — ED Notes (Signed)
Pt c/o blurred vision x 2 days with HA x 4 days; pt noted unequal pupils today; pt taking oral chemo at present

## 2014-10-23 NOTE — ED Notes (Signed)
Pt in CT.

## 2014-10-23 NOTE — ED Notes (Signed)
Unable to get blood from port. Phlebotomy notified.

## 2014-10-23 NOTE — Telephone Encounter (Signed)
agree

## 2014-10-23 NOTE — Consult Note (Addendum)
NEURO HOSPITALIST CONSULT NOTE    Reason for Consult: dilated right pupil with blurred vision.   HPI:                                                                                                                                          Brenda Mcguire is an 53 y.o. female with history of Bechet's disease, seizures believed to be secondary to Bechet's with sudden onset of right dilated pupil and decreased vision.  Patient was on her computer last night when she had a sudden onset of loss of vision in her right eye.  She states it was only seconds then came back to normal.  She did not seek medical attention as her vision had returned.  This AM she noted her vision in her right eye was blurry.  She covered both eyes and noted it was only in the right eye.  She went to the bathroom and looked in the mirror and noted her right pupil was very large.  Due to fear, she called a MD she works with and had him look at her eye.  She was immediately sent to the ED.  She states she has had optic neuritis in her right eye in the past which was thought to be secondary to her Behcet's. She was placed on Prednisone and IV Cytoxin at that time and her symptoms resolved. She does have seizure history and on Depakote, Neurontin, Keppra and recently placed back on PO Currently she is back on PO Cytoxin fer her Bechet's   Past Medical History  Diagnosis Date  . GERD (gastroesophageal reflux disease)   . Hyperlipidemia   . History of blood transfusion   . Chronic headaches   . Phlebitis   . History of colonic polyps   . 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 Tonic clonic seizures   . Anxiety     "situational stress"    Past Surgical History  Procedure Laterality Date  . Cholecystectomy    . Tonsillectomy  1970  . Appendectomy  1975  . Lumbar fusion  1999    L5-S1  . Back surgery    . Abdominal hysterectomy  1988  . Portacath placement Left 2008  . Stone extraction with basket       Family History  Problem Relation Age of Onset  . Diabetes Mother   . Hyperlipidemia Mother   . Stroke Mother   . Hypertension Mother   . Colon cancer Mother   . Breast cancer Mother   . Early death Father   . Heart attack Father   . Heart disease Father   . Hyperlipidemia Father   . COPD Sister      Social History:  reports that she has never  smoked. She has never used smokeless tobacco. She reports that she does not drink alcohol or use illicit drugs.  Allergies  Allergen Reactions  . Hepatitis B Vaccine Hives and Shortness Of Breath    Causes wheezing   . Imitrex [Sumatriptan] Other (See Comments)    Makes her have SVT and chest pain  . Iohexol Anaphylaxis, Hives and Shortness Of Breath  . Codeine Itching and Rash  . Keppra Xr [Levetiracetam] Nausea Only    MEDICATIONS:                                                                                                                     Current Facility-Administered Medications  Medication Dose Route Frequency Provider Last Rate Last Dose  . fentaNYL (SUBLIMAZE) injection 50 mcg  50 mcg Intravenous Once Ephraim Hamburger, MD       Current Outpatient Prescriptions  Medication Sig Dispense Refill  . ALPRAZolam (XANAX) 0.5 MG tablet Take 1 tablet (0.5 mg total) by mouth 3 (three) times daily as needed for anxiety. 90 tablet 0  . cyclophosphamide (CYTOXAN) 25 MG tablet Take 75 mg by mouth daily. Give on an empty stomach 1 hour before or 2 hours after meals.    . divalproex (DEPAKOTE) 250 MG DR tablet Take 2 tablets (500 mg total) by mouth 3 (three) times daily. 60 tablet 0  . gabapentin (NEURONTIN) 300 MG capsule Take 3 capsules (900 mg total) by mouth 3 (three) times daily. 90 capsule 2  . HYDROcodone-acetaminophen (NORCO/VICODIN) 5-325 MG per tablet Take 1 tablet by mouth 3 (three) times daily as needed for moderate pain. 90 tablet 0  . levETIRAcetam (KEPPRA) 500 MG tablet Take 3 tablets (1,500 mg total) by mouth 2 (two)  times daily. 90 tablet 0  . predniSONE (DELTASONE) 10 MG tablet Take 10 mg by mouth daily with breakfast.    . promethazine (PHENERGAN) 50 MG tablet Take 0.5 tablets (25 mg total) by mouth every 6 (six) hours as needed for nausea or vomiting. 30 tablet 2  . sulfamethoxazole-trimethoprim (BACTRIM DS) 800-160 MG per tablet Take 1 tablet by mouth every other day. Take every day because she is on Chemo Per patient    . diazepam (VALIUM) 5 MG tablet Take 5 mg by mouth every 6 (six) hours as needed for anxiety.    Marland Kitchen HYDROcodone-acetaminophen (NORCO/VICODIN) 5-325 MG per tablet Take 1 tablet by mouth 3 (three) times daily as needed for moderate pain. (Patient not taking: Reported on 10/23/2014) 90 tablet 0  . sertraline (ZOLOFT) 50 MG tablet Take 1 tablet (50 mg total) by mouth daily. (Patient not taking: Reported on 10/23/2014) 30 tablet 0  . zolpidem (AMBIEN) 10 MG tablet Take 1 tablet (10 mg total) by mouth at bedtime as needed for sleep. (Patient not taking: Reported on 10/23/2014) 30 tablet 0      ROS:  History obtained from the patient  General ROS: negative for - chills, fatigue, fever, night sweats, weight gain or weight loss Psychological ROS: negative for - behavioral disorder, hallucinations, memory difficulties, mood swings or suicidal ideation Ophthalmic ROS: negative for - blurry vision, double vision, eye pain or loss of vision ENT ROS: negative for - epistaxis, nasal discharge, oral lesions, sore throat, tinnitus or vertigo Allergy and Immunology ROS: negative for - hives or itchy/watery eyes Hematological and Lymphatic ROS: negative for - bleeding problems, bruising or swollen lymph nodes Endocrine ROS: negative for - galactorrhea, hair pattern changes, polydipsia/polyuria or temperature intolerance Respiratory ROS: negative for - cough, hemoptysis,  shortness of breath or wheezing Cardiovascular ROS: negative for - chest pain, dyspnea on exertion, edema or irregular heartbeat Gastrointestinal ROS: negative for - abdominal pain, diarrhea, hematemesis, nausea/vomiting or stool incontinence Genito-Urinary ROS: negative for - dysuria, hematuria, incontinence or urinary frequency/urgency Musculoskeletal ROS: negative for - joint swelling or muscular weakness Neurological ROS: as noted in HPI Dermatological ROS: negative for rash and skin lesion changes   Blood pressure 127/78, pulse 83, temperature 98.3 F (36.8 C), temperature source Oral, resp. rate 18, height 5' 10.5" (1.791 m), weight 79.833 kg (176 lb), SpO2 93 %.   Neurologic Examination:                                                                                                      General: NAD Mental Status: Alert, oriented, thought content appropriate.  Speech fluent without evidence of aphasia.  Able to follow 3 step commands without difficulty. Cranial Nerves: II: Left disc sharp.  Right disc blurred; Visual fields grossly normal in left eye, in right eye she exhibits a right inferior quadrantinopia.  Pupil (right--6 MM and non-reactive) (left --37m and reactive) , round, right pupil does no contrict when light shown in left pupil but left pupil reacts when light shown in right pupil. Loss of color vision in the right eye. III,IV, VI: ptosis not present, extra-ocular motions intact bilaterally V,VII: smile symmetric, facial light touch sensation normal bilaterally VIII: hearing normal bilaterally IX,X: gag reflex present XI: bilateral shoulder shrug XII: midline tongue extension without atrophy or fasciculations  Motor: Right : Upper extremity   5/5    Left:     Upper extremity   5/5  Lower extremity   5/5     Lower extremity   5/5 Tone and bulk:normal tone throughout; no atrophy noted Sensory: Pinprick and light touch intact throughout, bilaterally Deep Tendon  Reflexes:  Right: Upper Extremity   Left: Upper extremity   biceps (C-5 to C-6) 2/4   biceps (C-5 to C-6) 2/4 tricep (C7) 2/4    triceps (C7) 2/4 Brachioradialis (C6) 2/4  Brachioradialis (C6) 2/4  Lower Extremity Lower Extremity  quadriceps (L-2 to L-4) 2/4   quadriceps (L-2 to L-4) 2/4 Achilles (S1) 2/4   Achilles (S1) 2/4  Plantars: Right: downgoing   Left: downgoing Cerebellar: normal finger-to-nose,  normal heel-to-shin test Gait: not tested due to safety concerns.  CV: pulses palpable throughout    Lab Results:  Basic Metabolic Panel: No results for input(s): NA, K, CL, CO2, GLUCOSE, BUN, CREATININE, CALCIUM, MG, PHOS in the last 168 hours.  Liver Function Tests: No results for input(s): AST, ALT, ALKPHOS, BILITOT, PROT, ALBUMIN in the last 168 hours. No results for input(s): LIPASE, AMYLASE in the last 168 hours. No results for input(s): AMMONIA in the last 168 hours.  CBC: No results for input(s): WBC, NEUTROABS, HGB, HCT, MCV, PLT in the last 168 hours.  Cardiac Enzymes: No results for input(s): CKTOTAL, CKMB, CKMBINDEX, TROPONINI in the last 168 hours.  Lipid Panel: No results for input(s): CHOL, TRIG, HDL, CHOLHDL, VLDL, LDLCALC in the last 168 hours.  CBG: No results for input(s): GLUCAP in the last 168 hours.  Microbiology: Results for orders placed or performed during the hospital encounter of 08/31/14  MRSA PCR Screening     Status: None   Collection Time: 08/31/14  8:15 PM  Result Value Ref Range Status   MRSA by PCR NEGATIVE NEGATIVE Final    Comment:        The GeneXpert MRSA Assay (FDA approved for NASAL specimens only), is one component of a comprehensive MRSA colonization surveillance program. It is not intended to diagnose MRSA infection nor to guide or monitor treatment for MRSA infections.    Coagulation Studies: No results for input(s): LABPROT, INR in the last 72 hours.  Imaging: Ct Head Wo Contrast  10/23/2014   CLINICAL DATA:   Five day history of headache. Intermittent blurred vision. Unequal pupils. Patient with Behcet's disease  EXAM: CT HEAD WITHOUT CONTRAST  TECHNIQUE: Contiguous axial images were obtained from the base of the skull through the vertex without intravenous contrast.  COMPARISON:  August 31, 2014  FINDINGS: The ventricles are normal in size and configuration. There is no mass, hemorrhage, extra-axial fluid collection, or midline shift. Gray-white compartments are normal. There is no demonstrable acute infarct. Bony calvarium appears intact. The mastoid air cells are clear.  IMPRESSION: Study within normal limits. No intracranial mass, hemorrhage, or focal gray -white compartment lesions/acute appearing infarct.   Electronically Signed   By: Lowella Grip M.D.   On: 10/23/2014 15:08    Etta Quill PA-C [facility] [phone]  10/23/2014, 4:15 PM  Patient seen and examined.  Clinical course and management discussed.  Necessary edits performed.  I agree with the above.  Assessment and plan of care developed and discussed below.    Assessment/Plan: 53 year old female with a history of Bechet's who presents with loss of vision in the right eye.  Patient with disc blurring on opthalmic examination.  Inferior quadrantinopia noted as well.  Concern is for a recurrent optic neuritis (patient has had optic neuritis in the past).  Will rule out a vascular lesion as well.    Recommendations: 1.  MRI, MRA, MRV of the brain 2.  ESR, CRP 3.  Solumedrol 219m q 6 hours for 3 days 4.  ECASA 3262mdaily 5.  Ophthalmology consult   LeAlexis GoodellMD [facility] [phone]/09/2014  6:00 PM

## 2014-10-23 NOTE — H&P (Signed)
[facility] History and Physical  Brenda Mcguire ZOX:[id number] DOB: 1960/12/25 DOA: 10/23/2014  Referring physician: Pricilla Loveless, MD PCP: Nicki Reaper, NP   Chief Complaint: Loss of Vision in Right Eye  HPI: Brenda Mcguire is a 53 y.o. female with prior history of Bechets Syndrome presents with sudden loss of vision in the right eye. Patient states that she was her usual self yesterday when she experienced a loss of vision in the right eye. Patient states that it only lasted a few seconds so did not think much more about it.Today however she noted that she had increased bluriness in her right eye. 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 She states that she also noted that her right pupil looked blown. She apparently has a prior history of similar symptoms and was treated with steroids. In the ED Neurology has been asked to see her and they have suggested further diagnostic testing as well as admit for high dose steroids.   Review of Systems:  Complete 12 point ROS is negative other than what is noted above in HPI  Past Medical History  Diagnosis Date  . GERD (gastroesophageal reflux disease)   . Hyperlipidemia   . History of blood transfusion   . Chronic headaches   . Phlebitis   . History of colonic polyps   . 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 Tonic clonic seizures   . Anxiety     "situational stress"   Past Surgical History  Procedure Laterality Date  . Cholecystectomy    . Tonsillectomy  1970  . Appendectomy  1975  . Lumbar fusion  1999    L5-S1  . Back surgery    . Abdominal hysterectomy  1988  . Portacath placement Left 2008  . Stone extraction with basket     Social History:  reports that she has never smoked. She has never used smokeless tobacco. She reports that she does not drink alcohol or use illicit drugs.  Allergies  Allergen Reactions  . Hepatitis B Vaccine Hives  and Shortness Of Breath    Causes wheezing   . Imitrex [Sumatriptan] Other (See Comments)    Makes her have SVT and chest pain  . Iohexol Anaphylaxis, Hives and Shortness Of Breath  . Codeine Itching and Rash  . Keppra Xr [Levetiracetam] Nausea Only    Family History  Problem Relation Age of Onset  . Diabetes Mother   . Hyperlipidemia Mother   . Stroke Mother   . Hypertension Mother   . Colon cancer Mother   . Breast cancer Mother   . Early death Father   . Heart attack Father   . Heart disease Father   . Hyperlipidemia Father   . COPD Sister      Prior to Admission medications   Medication Sig Start Date End Date Taking? Authorizing Provider  ALPRAZolam Prudy Feeler) 0.5 MG tablet Take 1 tablet (0.5 mg total) by mouth 3 (three) times daily as needed for anxiety. 10/09/14  Yes Lorre Munroe, NP  cyclophosphamide (CYTOXAN) 25 MG tablet Take 75 mg by mouth daily. Give on an empty stomach 1 hour before or 2 hours after meals.   Yes Historical Provider, MD  divalproex (DEPAKOTE) 250 MG DR tablet Take 2 tablets (500 mg total) by mouth 3 (three) times daily. 09/02/14  Yes Jeanella Craze, NP  gabapentin (NEURONTIN) 300 MG capsule  Take 3 capsules (900 mg total) by mouth 3 (three) times daily. 10/17/14  Yes Lorre Munroeegina W Baity, NP  HYDROcodone-acetaminophen (NORCO/VICODIN) 5-325 MG per tablet Take 1 tablet by mouth 3 (three) times daily as needed for moderate pain. 10/15/14  Yes Lorre Munroeegina W Baity, NP  levETIRAcetam (KEPPRA) 500 MG tablet Take 3 tablets (1,500 mg total) by mouth 2 (two) times daily. 09/02/14  Yes Jeanella CrazeBrandi L Ollis, NP  predniSONE (DELTASONE) 10 MG tablet Take 10 mg by mouth daily with breakfast.   Yes Historical Provider, MD  promethazine (PHENERGAN) 50 MG tablet Take 0.5 tablets (25 mg total) by mouth every 6 (six) hours as needed for nausea or vomiting. 10/22/14  Yes Lorre Munroeegina W Baity, NP  sulfamethoxazole-trimethoprim (BACTRIM DS) 800-160 MG per tablet Take 1 tablet by mouth every other day. Take  every day because she is on Chemo Per patient   Yes Historical Provider, MD  diazepam (VALIUM) 5 MG tablet Take 5 mg by mouth every 6 (six) hours as needed for anxiety.    Historical Provider, MD  HYDROcodone-acetaminophen (NORCO/VICODIN) 5-325 MG per tablet Take 1 tablet by mouth 3 (three) times daily as needed for moderate pain. Patient not taking: Reported on 10/23/2014 10/15/14   Lorre Munroeegina W Baity, NP  sertraline (ZOLOFT) 50 MG tablet Take 1 tablet (50 mg total) by mouth daily. Patient not taking: Reported on 10/23/2014 09/02/14   Jeanella CrazeBrandi L Ollis, NP  zolpidem (AMBIEN) 10 MG tablet Take 1 tablet (10 mg total) by mouth at bedtime as needed for sleep. Patient not taking: Reported on 10/23/2014 10/22/14 11/21/14  Lorre Munroeegina W Baity, NP   Physical Exam: Filed Vitals:   10/23/14 1709 10/23/14 1730 10/23/14 1745 10/23/14 1815  BP: 127/93 131/88 109/83 126/66  Pulse: 82 73  74  Temp: 98.6 F (37 C)     TempSrc: Oral     Resp: 20     Height:      Weight:      SpO2: 99% 99%  99%    Wt Readings from Last 3 Encounters:  10/23/14 79.833 kg (176 lb)  09/02/14 82.2 kg (181 lb 3.5 oz)  07/18/14 83.802 kg (184 lb 12 oz)    General:  Appears anxious Eyes: Light pupil fully dilated compared to left. Increased sensitivity to light noted on exam ENT: grossly normal hearing, lips & tongue Neck: no LAD, masses or thyromegaly Cardiovascular: RRR, no m/r/g. No LE edema. Telemetry: SR, no arrhythmias  Respiratory: CTA bilaterally, no w/r/r. Normal respiratory effort. Abdomen: soft, ntnd Skin: no rash or induration seen on limited exam Musculoskeletal: grossly normal tone BUE/BLE Psychiatric: grossly normal mood and affect, speech fluent and appropriate Neurologic: grossly non-focal.          Labs on Admission:  Basic Metabolic Panel:  Recent Labs Lab 10/23/14 1741  NA 139  K 4.3  CL 104  CO2 19  GLUCOSE 91  BUN 10  CREATININE 0.64  CALCIUM 9.4   Liver Function Tests: No results for  input(s): AST, ALT, ALKPHOS, BILITOT, PROT, ALBUMIN in the last 168 hours. No results for input(s): LIPASE, AMYLASE in the last 168 hours. No results for input(s): AMMONIA in the last 168 hours. CBC:  Recent Labs Lab 10/23/14 1741  WBC 7.7  NEUTROABS 5.2  HGB 12.0  HCT 35.0*  MCV 93.8  PLT 268   Cardiac Enzymes: No results for input(s): CKTOTAL, CKMB, CKMBINDEX, TROPONINI in the last 168 hours.  BNP (last 3 results) No results for input(s): PROBNP in  the last 8760 hours. CBG: No results for input(s): GLUCAP in the last 168 hours.  Radiological Exams on Admission: Ct Head Wo Contrast  10/23/2014   CLINICAL DATA:  Five day history of headache. Intermittent blurred vision. Unequal pupils. Patient with Behcet's disease  EXAM: CT HEAD WITHOUT CONTRAST  TECHNIQUE: Contiguous axial images were obtained from the base of the skull through the vertex without intravenous contrast.  COMPARISON:  August 31, 2014  FINDINGS: The ventricles are normal in size and configuration. There is no mass, hemorrhage, extra-axial fluid collection, or midline shift. Gray-white compartments are normal. There is no demonstrable acute infarct. Bony calvarium appears intact. The mastoid air cells are clear.  IMPRESSION: Study within normal limits. No intracranial mass, hemorrhage, or focal gray -white compartment lesions/acute appearing infarct.   Electronically Signed   By: Bretta BangWilliam  Woodruff M.D.   On: 10/23/2014 15:08      Assessment/Plan Principal Problem:   Vision loss of right eye Active Problems:   Seizures   Blurred vision   Acute loss of vision   1. Acute vision loss of the right eye -seen by neurology -will get MRI MRA MRV -sed rate and CRP also ordered -will be started on high dose IV steroids Solumedrol 250 mg q6 x3days  2. Seizures -will continue on home medications -monitor for recurrence  3. GERD -will continue with home medications  4. Headaches -likely related to above -no  prior history of migraines per patient    Code Status: Full Code (must indicate code status--if unknown or must be presumed, indicate so) DVT Prophylaxis:Heparin Family Communication: none (indicate person spoken with, if applicable, with phone number if by telephone) Disposition Plan: Home (indicate anticipated LOS)  Time spent: 70min  [facility] Medical CenterKHAN,Joaquim Tolen A [facility] Pager [phone]

## 2014-10-23 NOTE — ED Notes (Signed)
MD at bedside. 

## 2014-10-24 ENCOUNTER — Inpatient Hospital Stay (HOSPITAL_COMMUNITY): Payer: Federal, State, Local not specified - PPO | Admitting: Certified Registered"

## 2014-10-24 ENCOUNTER — Inpatient Hospital Stay (HOSPITAL_COMMUNITY): Payer: Federal, State, Local not specified - PPO

## 2014-10-24 ENCOUNTER — Encounter (HOSPITAL_COMMUNITY)
Admission: EM | Disposition: A | Discharge status: Home / Self Care | Payer: Self-pay | Source: Home / Self Care | Attending: Internal Medicine

## 2014-10-24 ENCOUNTER — Encounter (HOSPITAL_COMMUNITY): Payer: Self-pay

## 2014-10-24 HISTORY — PX: RADIOLOGY WITH ANESTHESIA: SHX6223

## 2014-10-24 LAB — COMPREHENSIVE METABOLIC PANEL
ALK PHOS: 84 U/L (ref 39–117)
ALT: 55 U/L — ABNORMAL HIGH (ref 0–35)
ANION GAP: 16 — AB (ref 5–15)
AST: 40 U/L — ABNORMAL HIGH (ref 0–37)
Albumin: 3.5 g/dL (ref 3.5–5.2)
BUN: 15 mg/dL (ref 6–23)
CO2: 19 mEq/L (ref 19–32)
Calcium: 9.2 mg/dL (ref 8.4–10.5)
Chloride: 103 mEq/L (ref 96–112)
Creatinine, Ser: 0.63 mg/dL (ref 0.50–1.10)
GFR calc Af Amer: >90 mL/min (ref >90)
GLUCOSE: 176 mg/dL — AB (ref 70–99)
POTASSIUM: 4.2 meq/L (ref 3.7–5.3)
Sodium: 138 mEq/L (ref 137–147)
TOTAL PROTEIN: 6.8 g/dL (ref 6.0–8.3)
Total Bilirubin: 0.2 mg/dL — ABNORMAL LOW (ref 0.3–1.2)

## 2014-10-24 LAB — CBC
HEMATOCRIT: 34.2 % — AB (ref 36.0–46.0)
Hemoglobin: 11.7 g/dL — ABNORMAL LOW (ref 12.0–15.0)
MCH: 32 pg (ref 26.0–34.0)
MCHC: 34.2 g/dL (ref 30.0–36.0)
MCV: 93.4 fL (ref 78.0–100.0)
Platelets: 306 10*3/uL (ref 150–400)
RBC: 3.66 MIL/uL — ABNORMAL LOW (ref 3.87–5.11)
RDW: 12.2 % (ref 11.5–15.5)
WBC: 6 10*3/uL (ref 4.0–10.5)

## 2014-10-24 LAB — HEMOGLOBIN A1C
Hgb A1c MFr Bld: 5.8 % — ABNORMAL HIGH (ref <5.7)
Mean Plasma Glucose: 120 mg/dL — ABNORMAL HIGH (ref <117)

## 2014-10-24 LAB — GLUCOSE, CAPILLARY: Glucose-Capillary: 155 mg/dL — ABNORMAL HIGH (ref 70–99)

## 2014-10-24 SURGERY — RADIOLOGY WITH ANESTHESIA
Anesthesia: General

## 2014-10-24 MED ORDER — MORPHINE SULFATE 2 MG/ML IJ SOLN
1.0000 mg | INTRAMUSCULAR | Status: AC
  Administered 2014-10-24: 1 mg via INTRAVENOUS
  Filled 2014-10-24: qty 1

## 2014-10-24 MED ORDER — ONDANSETRON HCL 4 MG/2ML IJ SOLN
INTRAMUSCULAR | Status: DC | PRN
  Administered 2014-10-24: 4 mg via INTRAVENOUS

## 2014-10-24 MED ORDER — GLYCOPYRROLATE 0.2 MG/ML IJ SOLN
INTRAMUSCULAR | Status: DC | PRN
  Administered 2014-10-24: 0.3 mg via INTRAVENOUS

## 2014-10-24 MED ORDER — FENTANYL CITRATE 0.05 MG/ML IJ SOLN
INTRAMUSCULAR | Status: DC | PRN
  Administered 2014-10-24: 100 ug via INTRAVENOUS

## 2014-10-24 MED ORDER — ROCURONIUM BROMIDE 100 MG/10ML IV SOLN
INTRAVENOUS | Status: DC | PRN
  Administered 2014-10-24: 30 mg via INTRAVENOUS

## 2014-10-24 MED ORDER — PROMETHAZINE HCL 25 MG/ML IJ SOLN
12.5000 mg | Freq: Once | INTRAMUSCULAR | Status: AC
  Administered 2014-10-25: 12.5 mg via INTRAVENOUS
  Filled 2014-10-24: qty 1

## 2014-10-24 MED ORDER — ALTEPLASE 2 MG IJ SOLR
2.0000 mg | Freq: Once | INTRAMUSCULAR | Status: DC
  Filled 2014-10-24: qty 2

## 2014-10-24 MED ORDER — EPHEDRINE SULFATE 50 MG/ML IJ SOLN
INTRAMUSCULAR | Status: DC | PRN
  Administered 2014-10-24: 10 mg via INTRAVENOUS
  Administered 2014-10-24: 15 mg via INTRAVENOUS

## 2014-10-24 MED ORDER — SODIUM CHLORIDE 0.9 % IJ SOLN
10.0000 mL | INTRAMUSCULAR | Status: DC | PRN
  Administered 2014-10-24 (×2): 20 mL
  Administered 2014-10-26: 10 mL
  Filled 2014-10-24 (×3): qty 40

## 2014-10-24 MED ORDER — NEOSTIGMINE METHYLSULFATE 10 MG/10ML IV SOLN
INTRAVENOUS | Status: DC | PRN
  Administered 2014-10-24: 4 mg via INTRAVENOUS

## 2014-10-24 MED ORDER — ONDANSETRON HCL 4 MG/2ML IJ SOLN: 4.0000 mg | Freq: Four times a day (QID) | INTRAMUSCULAR | Status: DC | PRN

## 2014-10-24 MED ORDER — KETOROLAC TROMETHAMINE 30 MG/ML IJ SOLN
30.0000 mg | Freq: Once | INTRAMUSCULAR | Status: AC
  Administered 2014-10-25: 30 mg via INTRAVENOUS
  Filled 2014-10-24: qty 1

## 2014-10-24 MED ORDER — LORAZEPAM 2 MG/ML IJ SOLN
2.0000 mg | Freq: Four times a day (QID) | INTRAMUSCULAR | Status: DC | PRN
  Administered 2014-10-24 – 2014-10-25 (×3): 2 mg via INTRAVENOUS
  Filled 2014-10-24 (×3): qty 1

## 2014-10-24 MED ORDER — MIDAZOLAM HCL 5 MG/5ML IJ SOLN
INTRAMUSCULAR | Status: DC | PRN
  Administered 2014-10-24: 2 mg via INTRAVENOUS

## 2014-10-24 MED ORDER — LORAZEPAM 2 MG/ML IJ SOLN
1.0000 mg | INTRAMUSCULAR | Status: AC
  Administered 2014-10-24: 1 mg via INTRAVENOUS

## 2014-10-24 MED ORDER — DIPHENHYDRAMINE HCL 50 MG/ML IJ SOLN
25.0000 mg | Freq: Once | INTRAMUSCULAR | Status: AC
  Administered 2014-10-25: 25 mg via INTRAVENOUS
  Filled 2014-10-24: qty 1

## 2014-10-24 MED ORDER — SODIUM CHLORIDE 0.9 % IV SOLN
INTRAVENOUS | Status: DC | PRN
  Administered 2014-10-24: 11:00:00 via INTRAVENOUS

## 2014-10-24 MED ORDER — SUCCINYLCHOLINE CHLORIDE 20 MG/ML IJ SOLN
INTRAMUSCULAR | Status: DC | PRN
  Administered 2014-10-24: 100 mg via INTRAVENOUS

## 2014-10-24 MED ORDER — METOCLOPRAMIDE HCL 5 MG/ML IJ SOLN
10.0000 mg | Freq: Once | INTRAMUSCULAR | Status: AC
  Administered 2014-10-25: 10 mg via INTRAVENOUS
  Filled 2014-10-24: qty 2

## 2014-10-24 MED ORDER — PROPOFOL 10 MG/ML IV BOLUS
INTRAVENOUS | Status: DC | PRN
  Administered 2014-10-24: 150 mg via INTRAVENOUS

## 2014-10-24 MED ORDER — HYDROCORTISONE NA SUCCINATE PF 100 MG IJ SOLR
INTRAMUSCULAR | Status: DC | PRN
  Administered 2014-10-24: 100 mg via INTRAVENOUS

## 2014-10-24 MED ORDER — OXYCODONE HCL 5 MG PO TABS: 5.0000 mg | ORAL_TABLET | Freq: Once | ORAL | Status: DC

## 2014-10-24 MED ORDER — PHENYLEPHRINE HCL 10 MG/ML IJ SOLN
INTRAMUSCULAR | Status: DC | PRN
  Administered 2014-10-24: 120 ug via INTRAVENOUS
  Administered 2014-10-24: 160 ug via INTRAVENOUS
  Administered 2014-10-24: 120 ug via INTRAVENOUS

## 2014-10-24 MED ORDER — LORAZEPAM 2 MG/ML IJ SOLN
INTRAMUSCULAR | Status: AC
  Filled 2014-10-24: qty 1

## 2014-10-24 NOTE — Anesthesia Postprocedure Evaluation (Addendum)
  Anesthesia Post-op Note  Patient: Brenda MannanWanda Mcguire  Procedure(s) Performed: Procedure(s): RADIOLOGY WITH ANESTHESIA (N/A)  Patient Location: Nursing Unit  Anesthesia Type:General  Level of Consciousness: awake, alert  and oriented  Airway and Oxygen Therapy: Patient Spontanous Breathing and Patient connected to nasal cannula oxygen  Post-op Pain: moderate  Post-op Assessment: Post-op Vital signs reviewed, Patient's Cardiovascular Status Stable and Respiratory Function Stable  Post-op Vital Signs: Reviewed and stable  Last Vitals:  Filed Vitals:   10/24/14 0947  BP: 124/85  Pulse: 75  Temp: 36.9 C  Resp: 20    Complications: moderate to severe headache, minor nausea

## 2014-10-24 NOTE — Progress Notes (Signed)
Neuro on call notified regarding patients seizure activity.  Neuro agreed with Ativan and Valproic acid level draw.  Will continue to monitor patient.

## 2014-10-24 NOTE — Consult Note (Signed)
Reason for consult:  HPI: Brenda Mcguire is an 53 y.o. female with a hx of Bechet's who we are asked to evaluate for reports of loss of vision OD and anisocoria.   The patient reports that last night she noted a subacute episode of monocular vision loss OD.  She describes a "black out" of vision OD that lasted several minutes then subsided on its own.  Since that time she reports having retrobulbar pain OD with movement of the eye.  She has also reported three separate episodes of momentary loss of vision (lasting seconds) associated with sever eye pain since being hospitalized.   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 GERD (gastroesophageal reflux disease)   . Hyperlipidemia   . History of blood transfusion   . Chronic headaches   . Phlebitis   . History of colonic polyps   . 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 Tonic clonic seizures   . Anxiety     "situational stress"  . Kidney stones [phone]   Past Surgical History  Procedure Laterality Date  . Cholecystectomy    . Tonsillectomy  1970  . Appendectomy  1975  . Lumbar fusion  1999    L5-S1  . Abdominal hysterectomy  1988  . Portacath placement Left 2008  . Stone extraction with basket    . Back surgery      L5-S1 ALIF  . Adenoidectomy  at 53 years old  . Oophorectomy  1987   Family History  Problem Relation Age of Onset  . Diabetes Mother   . Hyperlipidemia Mother   . Stroke Mother   . Hypertension Mother   . Colon cancer Mother   . Breast cancer Mother   . Early death Father   . Heart attack Father   . Heart disease Father   . Hyperlipidemia Father   . COPD Sister    Current Facility-Administered Medications  Medication Dose Route Frequency Provider Last Rate Last Dose  . 0.9 %  sodium chloride infusion   Intravenous Continuous Allyne Gee, MD 50  mL/hr at 10/23/14 2023    . ALPRAZolam Duanne Moron) tablet 0.5 mg  0.5 mg Oral TID PRN Allyne Gee, MD   0.5 mg at 10/24/14 1653  . alteplase (CATHFLO ACTIVASE) injection 2 mg  2 mg Intracatheter Once Allyne Gee, MD   2 mg at 10/24/14 1624  . alum & mag hydroxide-simeth (MAALOX/MYLANTA) 200-200-20 MG/5ML suspension 30 mL  30 mL Oral Q6H PRN Allyne Gee, MD      . aspirin EC tablet 325 mg  325 mg Oral Daily Allyne Gee, MD   325 mg at 10/24/14 1409  . bisacodyl (DULCOLAX) suppository 10 mg  10 mg Rectal Daily PRN Allyne Gee, MD      . Cyclophosphamide CAPS 75 mg  75 mg Oral QHS Narda Bonds, RPH   75 mg at 10/23/14 2214  . divalproex (DEPAKOTE) DR tablet 500 mg  500 mg Oral TID Allyne Gee, MD   500 mg at 10/24/14 1408  . docusate sodium (COLACE) capsule 100 mg  100 mg Oral BID Allyne Gee, MD   100 mg at 10/24/14 1409  . folic acid (FOLVITE) tablet 1 mg  1 mg Oral Daily  Allyne Gee, MD   1 mg at 10/24/14 1408  . gabapentin (NEURONTIN) capsule 900 mg  900 mg Oral TID Allyne Gee, MD   900 mg at 10/24/14 1409  . heparin injection 5,000 Units  5,000 Units Subcutaneous 3 times per day Allyne Gee, MD   5,000 Units at 10/24/14 1403  . levETIRAcetam (KEPPRA) tablet 1,500 mg  1,500 mg Oral BID Allyne Gee, MD   1,500 mg at 10/24/14 1407  . methylPREDNISolone sodium succinate (SOLU-MEDROL) 250 mg in sodium chloride 0.9 % 50 mL IVPB  250 mg Intravenous Q6H Allyne Gee, MD   250 mg at 10/24/14 1403  . morphine 2 MG/ML injection 1 mg  1 mg Intravenous Q3H PRN Allyne Gee, MD   1 mg at 10/24/14 1641  . multivitamin with minerals tablet 1 tablet  1 tablet Oral Daily Allyne Gee, MD   1 tablet at 10/24/14 1408  . ondansetron (ZOFRAN) tablet 4 mg  4 mg Oral Q6H PRN Allyne Gee, MD       Or  . ondansetron [facility]) injection 4 mg  4 mg Intravenous Q6H PRN Allyne Gee, MD   4 mg at 10/24/14 1343  . oxyCODONE (Oxy IR/ROXICODONE) immediate release tablet 5 mg  5 mg Oral Q4H PRN Allyne Gee, MD   5 mg at 10/24/14 1534  . polyethylene glycol (MIRALAX / GLYCOLAX) packet 17 g  17 g Oral Daily PRN Allyne Gee, MD      . sodium chloride 0.9 % injection 10-40 mL  10-40 mL Intracatheter PRN Allyne Gee, MD   20 mL at 10/24/14 1620  . sulfamethoxazole-trimethoprim (BACTRIM DS,SEPTRA DS) 800-160 MG per tablet 1 tablet  1 tablet Oral QODAY Allyne Gee, MD   1 tablet at 10/24/14 1408  . thiamine (VITAMIN B-1) tablet 100 mg  100 mg Oral Daily Allyne Gee, MD   100 mg at 10/24/14 1409  . zolpidem (AMBIEN) tablet 5 mg  5 mg Oral QHS PRN Allyne Gee, MD   5 mg at 10/23/14 2219   Allergies  Allergen Reactions  . Hepatitis B Vaccine Hives and Shortness Of Breath    Causes wheezing   . Imitrex [Sumatriptan] Other (See Comments)    Makes her have SVT and chest pain  . Iohexol Anaphylaxis, Hives and Shortness Of Breath  . Codeine Itching and Rash  . Keppra Xr [Levetiracetam] Nausea Only   History   Social History  . Marital Status: Married    Spouse Name: Brenda Mcguire    Number of Children: 2  . Years of Education: 14   Occupational History  . Nurse     [employer]  .     Social History Main Topics  . Smoking status: Never Smoker   . Smokeless tobacco: Never Used  . Alcohol Use: No  . Drug Use: No  . Sexual Activity: Yes    Birth Control/ Protection: Surgical   Other Topics Concern  . Not on file   Social History Narrative   Lives with husband.  They have 2 grown children.   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   Physical Exam:  Blood pressure 108/70, pulse 81, temperature 98.5 F (  36.9 C), temperature source Oral, resp. rate 20, height 5' 10"  (1.778 m), weight 84.5 kg (186 lb 4.6 oz), SpO2 96 %.   VA cc (OTC rdrs):  OD 20/200  OS  20/25  Pupils:   OD  dilated, entirely non-reactive to light; no APD by reverse            OS round, reactive to light, no APD  Dark  8   5 Light   8  3  IOP (T pen)  OD 16   OS 15     Motility:  OD full ductions  OS full ductions  Balance/alignment:  Ortho by Luiz Ochoa   Bedside examination:                                 OD                                       External/adnexa: Normal                                      Lids/lashes:        Normal                                      Conjunctiva        White, quiet        Cornea:              Clear                  AC:                     Deep, quiet                                Iris:                     Normal        Lens:                  Clear                                       OS                                       External/adnexa: Normal                                      Lids/lashes:        Normal                                      Conjunctiva        White, quiet  Cornea:              Clear                  AC:                     Deep, quiet                                Iris:                     Normal        Lens:                  Clear       Dilated fundus exam: (OD dilated; view OS good with out dilating drops) 20D lens      OD Vitreous            Clear, quiet                                Optic Disc:       Normal, perfused; no edema; no pallor                      Macula:             Flat                                            Vessels:           Normal caliber,distribution         Periphery:         Flat, attached                                      OS Vitreous            Clear, quiet                                Optic Disc:       Normal, perfused; no edema; no pallor                      Macula:             Flat                                            Vessels:           Normal caliber,distribution         Periphery:         Flat, attached (view to mid periphery)    PILO 2% diluted 1:16 drops  applied OU;  20 mins later OD no change  OS no sig change PIOLO 2% applied OD; 20 minutes later; No change.   Labs/studies: Results for orders placed or performed during the hospital encounter of 10/23/14 (from the past 48 hour(s))  CBC with Differential  Status: Abnormal   Collection Time: 10/23/14  5:41 PM  Result Value Ref Range   WBC 7.7 4.0 - 10.5 K/uL   RBC 3.73 (L) 3.87 - 5.11 MIL/uL   Hemoglobin 12.0 12.0 - 15.0 g/dL   HCT 35.0 (L) 36.0 - 46.0 %   MCV 93.8 78.0 - 100.0 fL   MCH 32.2 26.0 - 34.0 pg   MCHC 34.3 30.0 - 36.0 g/dL   RDW 12.1 11.5 - 15.5 %   Platelets 268 150 - 400 K/uL   Neutrophils Relative % 68 43 - 77 %   Neutro Abs 5.2 1.7 - 7.7 K/uL   Lymphocytes Relative 24 12 - 46 %   Lymphs Abs 1.9 0.7 - 4.0 K/uL   Monocytes Relative 5 3 - 12 %   Monocytes Absolute 0.4 0.1 - 1.0 K/uL   Eosinophils Relative 3 0 - 5 %   Eosinophils Absolute 0.2 0.0 - 0.7 K/uL   Basophils Relative 0 0 - 1 %   Basophils Absolute 0.0 0.0 - 0.1 K/uL  Basic metabolic panel     Status: Abnormal   Collection Time: 10/23/14  5:41 PM  Result Value Ref Range   Sodium 139 137 - 147 mEq/L   Potassium 4.3 3.7 - 5.3 mEq/L   Chloride 104 96 - 112 mEq/L   CO2 19 19 - 32 mEq/L   Glucose, Bld 91 70 - 99 mg/dL   BUN 10 6 - 23 mg/dL   Creatinine, Ser 0.64 0.50 - 1.10 mg/dL   Calcium 9.4 8.4 - 10.5 mg/dL   GFR calc non Af Amer >90 >90 mL/min   GFR calc Af Amer >90 >90 mL/min    Comment: (NOTE) The eGFR has been calculated using the CKD EPI equation. This calculation has not been validated in all clinical situations. eGFR's persistently <90 mL/min signify possible Chronic Kidney Disease.    Anion gap 16 (H) 5 - 15  Sedimentation rate     Status: None   Collection Time: 10/23/14  6:08 PM  Result Value Ref Range   Sed Rate 20 0 - 22 mm/hr  C-reactive protein     Status: Abnormal   Collection Time: 10/23/14  6:08 PM  Result Value Ref Range   CRP <0.5 (L) <0.60 mg/dL    Comment: Performed at  [facility]  TSH     Status: None   Collection Time: 10/23/14  8:51 PM  Result Value Ref Range   TSH 0.725 0.350 - 4.500 uIU/mL  Hemoglobin A1c     Status: Abnormal   Collection Time: 10/23/14  8:51 PM  Result Value Ref Range   Hgb A1c MFr Bld 5.8 (H) <5.7 %    Comment: (NOTE)                                                                       According to the ADA Clinical Practice Recommendations for 2011, when HbA1c is used as a screening test:  >=6.5%   Diagnostic of Diabetes Mellitus           (if abnormal result is confirmed) 5.7-6.4%   Increased risk of developing Diabetes Mellitus References:Diagnosis and Classification of Diabetes Mellitus,Diabetes NIOE,7035,00(XFGHW 1):S62-S69 and Standards of Medical Care  in         Diabetes - 2011,Diabetes Care,2011,34 (Suppl 1):S11-S61.    Mean Plasma Glucose 120 (H) <117 mg/dL    Comment: Performed at [facility]  Comprehensive metabolic panel     Status: Abnormal   Collection Time: 10/24/14  7:15 AM  Result Value Ref Range   Sodium 138 137 - 147 mEq/L   Potassium 4.2 3.7 - 5.3 mEq/L   Chloride 103 96 - 112 mEq/L   CO2 19 19 - 32 mEq/L   Glucose, Bld 176 (H) 70 - 99 mg/dL   BUN 15 6 - 23 mg/dL   Creatinine, Ser 0.63 0.50 - 1.10 mg/dL   Calcium 9.2 8.4 - 10.5 mg/dL   Total Protein 6.8 6.0 - 8.3 g/dL   Albumin 3.5 3.5 - 5.2 g/dL   AST 40 (H) 0 - 37 U/L   ALT 55 (H) 0 - 35 U/L   Alkaline Phosphatase 84 39 - 117 U/L   Total Bilirubin 0.2 (L) 0.3 - 1.2 mg/dL   GFR calc non Af Amer >90 >90 mL/min   GFR calc Af Amer >90 >90 mL/min    Comment: (NOTE) The eGFR has been calculated using the CKD EPI equation. This calculation has not been validated in all clinical situations. eGFR's persistently <90 mL/min signify possible Chronic Kidney Disease.    Anion gap 16 (H) 5 - 15  CBC     Status: Abnormal   Collection Time: 10/24/14  7:15 AM  Result Value Ref Range   WBC 6.0 4.0 - 10.5 K/uL   RBC 3.66 (L) 3.87 - 5.11  MIL/uL   Hemoglobin 11.7 (L) 12.0 - 15.0 g/dL   HCT 34.2 (L) 36.0 - 46.0 %   MCV 93.4 78.0 - 100.0 fL   MCH 32.0 26.0 - 34.0 pg   MCHC 34.2 30.0 - 36.0 g/dL   RDW 12.2 11.5 - 15.5 %   Platelets 306 150 - 400 K/uL  Glucose, capillary     Status: Abnormal   Collection Time: 10/24/14  7:58 AM  Result Value Ref Range   Glucose-Capillary 155 (H) 70 - 99 mg/dL   Comment 1 Notify RN    Comment 2 Documented in Chart    Ct Head Wo Contrast  10/23/2014   CLINICAL DATA:  Five day history of headache. Intermittent blurred vision. Unequal pupils. Patient with Behcet's disease  EXAM: CT HEAD WITHOUT CONTRAST  TECHNIQUE: Contiguous axial images were obtained from the base of the skull through the vertex without intravenous contrast.  COMPARISON:  August 31, 2014  FINDINGS: The ventricles are normal in size and configuration. There is no mass, hemorrhage, extra-axial fluid collection, or midline shift. Gray-white compartments are normal. There is no demonstrable acute infarct. Bony calvarium appears intact. The mastoid air cells are clear.  IMPRESSION: Study within normal limits. No intracranial mass, hemorrhage, or focal gray -white compartment lesions/acute appearing infarct.   Electronically Signed   By: Lowella Grip M.D.   On: 10/23/2014 15:08   Mr Jodene Nam Head Wo Contrast  10/24/2014   CLINICAL DATA:  Personal history of a Behcet's syndrome with sudden transient loss of vision in the right eye. Vision the right eye remains blurry. P biliary dilation scratch the the right pupil is dilated. During the course in the emergency department patient developed right-sided weakness in the upper and lower extremity.  EXAM: MRI HEAD WITHOUT CONTRAST  MRA HEAD WITHOUT CONTRAST  MRV HEAD WITHOUT CONTRAST  TECHNIQUE: Multiplanar, multiecho  pulse sequences of the brain and surrounding structures were obtained without intravenous contrast. Angiographic images of the head were obtained using MRA technique without  contrast. Angiographic images of the intracranial venous structures were obtained using MRV technique without intravenous contrast.  COMPARISON:  CT head without contrast 10/23/2014.  FINDINGS: MRI HEAD FINDINGS  The diffusion-weighted images demonstrate no evidence for acute or subacute infarction. No acute hemorrhage or mass lesion is evident. Scattered subcortical T2 hyperintensities in the anterior frontal lobes bilaterally are slightly greater than expected for age no confluent white matter changes are evident. Flow is present in the major intracranial arteries. The globes and orbits are intact. There is symmetric size and signal of the optic nerves bilaterally. The extraocular muscles are within normal limits.  The paranasal sinuses and mastoid air cells are clear. Flow is present in the major intracranial arteries. Midline structures are within normal limits.  MRA HEAD FINDINGS  The internal carotid arteries are within normal limits from the high cervical segments through the ICA termini bilaterally. The A1 and M1 segments are normal. The MCA bifurcations are within normal limits. ACA and MCA branch vessels are within normal limits.  The left vertebral artery is the dominant vessel. The PICA origins are visualized and normal bilaterally. The basilar artery is normal. Both posterior cerebral arteries originate from the basilar tip. The PCA branch vessels are within normal limits.  MRV HEAD FINDINGS  Major dural sinuses are patent. There is however, very limited flow within the left transverse sinus. The superior sagittal sinus an inferior sagittal sinus is patent. The right transverse sinus is patent. The torcula herophilii is midline suggesting non dominance of the transverse sinus. The left sigmoid sinus and internal jugular vein are present, the smaller than on the right. The dural veins are patent.  IMPRESSION: 1. Poor visualization of the left transverse sinus may represent stenosis of the sinus. There is  no definite thrombus. 2. Minimal scattered subcortical hyperintensities anteriorly are slightly greater than expected for age. These are not classic for a Behcet's. The finding is nonspecific but can be seen in the setting of chronic microvascular ischemia, a demyelinating process such as multiple sclerosis, vasculitis, complicated migraine headaches, or as the sequelae of a prior infectious or inflammatory process. 3. Otherwise normal MRI of the brain. 4. Normal variant MRA circle of Willis without evidence for significant proximal stenosis, aneurysm, or branch vessel occlusion. 5. Otherwise normal MRV without evidence for focal thrombosis.   Electronically Signed   By: Lawrence Santiago M.D.   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 Vision the right eye remains blurry. P biliary dilation scratch the the right pupil is dilated. During the course in the emergency department patient developed right-sided weakness in the upper and lower extremity.  EXAM: MRI HEAD WITHOUT CONTRAST  MRA HEAD WITHOUT CONTRAST  MRV HEAD WITHOUT CONTRAST  TECHNIQUE: Multiplanar, multiecho pulse sequences of the brain and surrounding structures were obtained without intravenous contrast. Angiographic images of the head were obtained using MRA technique without contrast. Angiographic images of the intracranial venous structures were obtained using MRV technique without intravenous contrast.  COMPARISON:  CT head without contrast 10/23/2014.  FINDINGS: MRI HEAD FINDINGS  The diffusion-weighted images demonstrate no evidence for acute or subacute infarction. No acute hemorrhage or mass lesion is evident. Scattered subcortical T2 hyperintensities in the anterior  frontal lobes bilaterally are slightly greater than expected for age no confluent white matter changes are evident. Flow is present in the major intracranial  arteries. The globes and orbits are intact. There is symmetric size and signal of the optic nerves bilaterally. The extraocular muscles are within normal limits.  The paranasal sinuses and mastoid air cells are clear. Flow is present in the major intracranial arteries. Midline structures are within normal limits.  MRA HEAD FINDINGS  The internal carotid arteries are within normal limits from the high cervical segments through the ICA termini bilaterally. The A1 and M1 segments are normal. The MCA bifurcations are within normal limits. ACA and MCA branch vessels are within normal limits.  The left vertebral artery is the dominant vessel. The PICA origins are visualized and normal bilaterally. The basilar artery is normal. Both posterior cerebral arteries originate from the basilar tip. The PCA branch vessels are within normal limits.  MRV HEAD FINDINGS  Major dural sinuses are patent. There is however, very limited flow within the left transverse sinus. The superior sagittal sinus an inferior sagittal sinus is patent. The right transverse sinus is patent. The torcula herophilii is midline suggesting non dominance of the transverse sinus. The left sigmoid sinus and internal jugular vein are present, the smaller than on the right. The dural veins are patent.  IMPRESSION: 1. Poor visualization of the left transverse sinus may represent stenosis of the sinus. There is no definite thrombus. 2. Minimal scattered subcortical hyperintensities anteriorly are slightly greater than expected for age. These are not classic for a Behcet's. The finding is nonspecific but can be seen in the setting of chronic microvascular ischemia, a demyelinating process such as multiple sclerosis, vasculitis, complicated migraine headaches, or as the sequelae of a prior infectious or inflammatory process. 3. Otherwise normal MRI of the brain. 4. Normal variant MRA circle of Willis without evidence for significant proximal stenosis, aneurysm, or  branch vessel occlusion. 5. Otherwise normal MRV without evidence for focal thrombosis.   Electronically Signed   By: Lawrence Santiago M.D.   On: 10/24/2014 13:15   Mr Hilary Hertz  10/24/2014   CLINICAL DATA:  Personal history of a Behcet's syndrome with sudden transient loss of vision in the right eye. Vision the right eye remains blurry. P biliary dilation scratch the the right pupil is dilated. During the course in the emergency department patient developed right-sided weakness in the upper and lower extremity.  EXAM: MRI HEAD WITHOUT CONTRAST  MRA HEAD WITHOUT CONTRAST  MRV HEAD WITHOUT CONTRAST  TECHNIQUE: Multiplanar, multiecho pulse sequences of the brain and surrounding structures were obtained without intravenous contrast. Angiographic images of the head were obtained using MRA technique without contrast. Angiographic images of the intracranial venous structures were obtained using MRV technique without intravenous contrast.  COMPARISON:  CT head without contrast 10/23/2014.  FINDINGS: MRI HEAD FINDINGS  The diffusion-weighted images demonstrate no evidence for acute or subacute infarction. No acute hemorrhage or mass lesion is evident. Scattered subcortical T2 hyperintensities in the anterior frontal lobes bilaterally are slightly greater than expected for age no confluent white matter changes are evident. Flow is present in the major intracranial arteries. The globes and orbits are intact. There is symmetric size and signal of the optic nerves bilaterally. The extraocular muscles are within normal limits.  The paranasal sinuses and mastoid air cells are clear. Flow is present in the major intracranial arteries. Midline structures are within normal limits.  MRA HEAD FINDINGS  The internal carotid  arteries are within normal limits from the high cervical segments through the ICA termini bilaterally. The A1 and M1 segments are normal. The MCA bifurcations are within normal limits. ACA and MCA branch  vessels are within normal limits.  The left vertebral artery is the dominant vessel. The PICA origins are visualized and normal bilaterally. The basilar artery is normal. Both posterior cerebral arteries originate from the basilar tip. The PCA branch vessels are within normal limits.  MRV HEAD FINDINGS  Major dural sinuses are patent. There is however, very limited flow within the left transverse sinus. The superior sagittal sinus an inferior sagittal sinus is patent. The right transverse sinus is patent. The torcula herophilii is midline suggesting non dominance of the transverse sinus. The left sigmoid sinus and internal jugular vein are present, the smaller than on the right. The dural veins are patent.  IMPRESSION: 1. Poor visualization of the left transverse sinus may represent stenosis of the sinus. There is no definite thrombus. 2. Minimal scattered subcortical hyperintensities anteriorly are slightly greater than expected for age. These are not classic for a Behcet's. The finding is nonspecific but can be seen in the setting of chronic microvascular ischemia, a demyelinating process such as multiple sclerosis, vasculitis, complicated migraine headaches, or as the sequelae of a prior infectious or inflammatory process. 3. Otherwise normal MRI of the brain. 4. Normal variant MRA circle of Willis without evidence for significant proximal stenosis, aneurysm, or branch vessel occlusion. 5. Otherwise normal MRV without evidence for focal thrombosis.   Electronically Signed   By: Lawrence Santiago M.D.   On: 10/24/2014 13:15                             Assessment and Plan:   Tida Saner is an 53 y.o. female with a hx of Bechet's who we are asked to evaluate for reports of loss of vision OD and anisocoria with:   -- Likely pharmacologic pupillary dilation OD  -- Self reported multiple episodes of amaurosis OD and decreased vision OD on my testing with normal oph exam and normal MRI.   Recommend:    Medical eval for amaurosis with carotid doppler, EKG (if not done already), and MRI/MRA (done already).   Out patient follow up with ophthalmology following discharge to further evaluation potential causes of decreased vision OD given the above normal exam and MRI/MRA findings.     All of the above information was relayed to the patient and/or patient family.  Ophthalmic warning signs and symptoms were reviewed, and clear instructions for immediate phone contact.   Follow up contact information was provided.  All questions were answered.   Katy Apo 10/24/2014, 5:19 PM  [facility] [phone]

## 2014-10-24 NOTE — Progress Notes (Signed)
Subjective: Patient reports that she has worsened overnight and is now having right sided weakness including the arm and leg.  Continued blurry vision from the right eye and photosensitivity.  Tolerating Solumedrol.  Patient remains anxious and tearful.    Objective: Current vital signs: BP 109/67 mmHg  Pulse 69  Temp(Src) 98.6 F (37 C) (Oral)  Resp 20  Ht 5\' 10"  (1.778 m)  Wt 84.5 kg (186 lb 4.6 oz)  BMI 26.73 kg/m2  SpO2 95% Vital signs in last 24 hours: Temp:  [98.3 F (36.8 C)-98.8 F (37.1 C)] 98.6 F (37 C) (11/11 0641) Pulse Rate:  [69-114] 69 (11/11 0641) Resp:  [18-20] 20 (11/11 0641) BP: (109-143)/(60-102) 109/67 mmHg (11/11 0641) SpO2:  [93 %-100 %] 95 % (11/11 0641) Weight:  [79.833 kg (176 lb)-84.5 kg (186 lb 4.6 oz)] 84.5 kg (186 lb 4.6 oz) (11/10 2012)  Intake/Output from previous day:   Intake/Output this shift:   Nutritional status:    Neurologic Exam: Mental Status: Alert, oriented, thought content appropriate.  Speech fluent without evidence of aphasia.  Able to follow 3 step commands without difficulty. Cranial Nerves: II: Due to photosensitivity patient will not allow me to view her eyes.  From a distance right eye remains dilated.   III,IV, VI: ptosis not present, extra-ocular motions intact bilaterally V,VII: smile symmetric, facial light touch sensation normal bilaterally VIII: hearing normal bilaterally IX,X: gag reflex present XI: bilateral shoulder shrug XII: midline tongue extension Motor: Right 4/5 hand grip. Give-way weakness with formal testing of right upper extremity.  Has right drift without pronation on pronator drift testing.  Give-way weakness noted in the RLE as well.  Able to hold both legs in the air symmetrically.  Gives minimal downward movement of the LLE when the RLE is lifted in isolation.    Deep Tendon Reflexes: 2+ and symmetric throughout Plantars: Right: downgoing   Left: downgoing Cerebellar: normal finger-to-nose,  normal rapid alternating movements and normal heel-to-shin test  Lab Results: Basic Metabolic Panel:  Recent Labs Lab 10/23/14 1741 10/24/14 0715  NA 139 138  K 4.3 4.2  CL 104 103  CO2 19 19  GLUCOSE 91 176*  BUN 10 15  CREATININE 0.64 0.63  CALCIUM 9.4 9.2    Liver Function Tests:  Recent Labs Lab 10/24/14 0715  AST 40*  ALT 55*  ALKPHOS 84  BILITOT 0.2*  PROT 6.8  ALBUMIN 3.5   No results for input(s): LIPASE, AMYLASE in the last 168 hours. No results for input(s): AMMONIA in the last 168 hours.  CBC:  Recent Labs Lab 10/23/14 1741 10/24/14 0715  WBC 7.7 6.0  NEUTROABS 5.2  --   HGB 12.0 11.7*  HCT 35.0* 34.2*  MCV 93.8 93.4  PLT 268 306    Cardiac Enzymes: No results for input(s): CKTOTAL, CKMB, CKMBINDEX, TROPONINI in the last 168 hours.  Lipid Panel: No results for input(s): CHOL, TRIG, HDL, CHOLHDL, VLDL, LDLCALC in the last 168 hours.  CBG:  Recent Labs Lab 10/24/14 0758  GLUCAP 155*    Microbiology: Results for orders placed or performed during the hospital encounter of 08/31/14  MRSA PCR Screening     Status: None   Collection Time: 08/31/14  8:15 PM  Result Value Ref Range Status   MRSA by PCR NEGATIVE NEGATIVE Final    Comment:        The GeneXpert MRSA Assay (FDA approved for NASAL specimens only), is one component of a comprehensive MRSA colonization surveillance program.  It is not intended to diagnose MRSA infection nor to guide or monitor treatment for MRSA infections.    Coagulation Studies: No results for input(s): LABPROT, INR in the last 72 hours.  Imaging: Ct Head Wo Contrast  10/23/2014   CLINICAL DATA:  Five day history of headache. Intermittent blurred vision. Unequal pupils. Patient with Behcet's disease  EXAM: CT HEAD WITHOUT CONTRAST  TECHNIQUE: Contiguous axial images were obtained from the base of the skull through the vertex without intravenous contrast.  COMPARISON:  August 31, 2014  FINDINGS: The  ventricles are normal in size and configuration. There is no mass, hemorrhage, extra-axial fluid collection, or midline shift. Gray-white compartments are normal. There is no demonstrable acute infarct. Bony calvarium appears intact. The mastoid air cells are clear.  IMPRESSION: Study within normal limits. No intracranial mass, hemorrhage, or focal gray -white compartment lesions/acute appearing infarct.   Electronically Signed   By: Bretta BangWilliam  Woodruff M.D.   On: 10/23/2014 15:08    Medications:  I have reviewed the patient's current medications. Scheduled: . alteplase  2 mg Intracatheter Once  . aspirin EC  325 mg Oral Daily  . Cyclophosphamide  75 mg Oral QHS  . divalproex  500 mg Oral TID  . docusate sodium  100 mg Oral BID  . folic acid  1 mg Oral Daily  . gabapentin  900 mg Oral TID  . heparin  5,000 Units Subcutaneous 3 times per day  . levETIRAcetam  1,500 mg Oral BID  . methylPREDNISolone (SOLU-MEDROL) injection  250 mg Intravenous Q6H  . multivitamin with minerals  1 tablet Oral Daily  . sulfamethoxazole-trimethoprim  1 tablet Oral QODAY  . thiamine  100 mg Oral Daily    Assessment/Plan: Patient today with complaints of right sided weakness.  Exam with multiple functional features.  Eye remains unchanged.  Patient receiving Solumedrol.  Remains on Cytoxan po.  Unable to have MRI's on yesterday with Ativan.  Will attempt under conscious sedation today.    Recommendations: 1.  MRI, MRA, MRV under conscious sedation.   2.  Continue Solumedrol   3.  Ophthalmology consult pending.     LOS: 1 day   Thana FarrLeslie Isamar Wellbrock, MD [facility] [phone] 10/24/2014  9:40 AM

## 2014-10-24 NOTE — Plan of Care (Signed)
Problem: Phase I Progression Outcomes Goal: OOB as tolerated unless otherwise ordered Outcome: Progressing     

## 2014-10-24 NOTE — Transfer of Care (Signed)
Immediate Anesthesia Transfer of Care Note  Patient: Brenda MannanWanda Mcguire  Procedure(s) Performed: Procedure(s): RADIOLOGY WITH ANESTHESIA (N/A)  Patient Location: Nursing Unit  Anesthesia Type:General  Level of Consciousness: awake, alert  and oriented  Airway & Oxygen Therapy: Patient Spontanous Breathing and Patient connected to nasal cannula oxygen  Post-op Assessment: report given to RN, Amil AmenJulia on [phone]  Post vital signs: Reviewed and stable  Complications: No apparent anesthesia complications

## 2014-10-24 NOTE — Plan of Care (Signed)
Problem: Phase I Progression Outcomes Goal: Voiding-avoid urinary catheter unless indicated Outcome: Progressing     

## 2014-10-24 NOTE — Anesthesia Procedure Notes (Signed)
Procedure Name: Intubation Date/Time: 10/24/2014 11:15 AM Performed by: Jefm MilesENNIE, Alondria Mousseau E Pre-anesthesia Checklist: Patient identified, Emergency Drugs available, Suction available and Patient being monitored Patient Re-evaluated:Patient Re-evaluated prior to inductionOxygen Delivery Method: Circle system utilized Preoxygenation: Pre-oxygenation with 100% oxygen Intubation Type: IV induction Ventilation: Mask ventilation without difficulty Laryngoscope Size: Mac and 3 Grade View: Grade I Tube type: Oral Tube size: 7.0 mm Number of attempts: 1 Airway Equipment and Method: Stylet Placement Confirmation: ETT inserted through vocal cords under direct vision,  positive ETCO2 and breath sounds checked- equal and bilateral Secured at: 22 cm Tube secured with: Tape Dental Injury: Teeth and Oropharynx as per pre-operative assessment

## 2014-10-24 NOTE — Anesthesia Preprocedure Evaluation (Addendum)
Anesthesia Evaluation  Patient identified by MRN, date of birth, ID band  Reviewed: Allergy & Precautions, H&P , NPO status , Patient's Chart, lab work & pertinent test results  Airway Mallampati: II  TM Distance: >3 FB Neck ROM: Full    Dental  (+) Teeth Intact, Missing, Dental Advisory Given   Pulmonary          Cardiovascular     Neuro/Psych  Headaches, Seizures -,  Anxiety    GI/Hepatic   Endo/Other    Renal/GU      Musculoskeletal   Abdominal   Peds  Hematology   Anesthesia Other Findings   Reproductive/Obstetrics                             Anesthesia Physical Anesthesia Plan  ASA: III  Anesthesia Plan: General   Post-op Pain Management:    Induction:   Airway Management Planned:   Additional Equipment:   Intra-op Plan:   Post-operative Plan:   Informed Consent:   Plan Discussed with:   Anesthesia Plan Comments:         Anesthesia Quick Evaluation

## 2014-10-24 NOTE — Progress Notes (Signed)
Pt also reports a complete loss of vision in her right eye, after she feels intense pain in that eye. This loss of vision only lasts a few seconds at a time, and it has happened 3 times: once at home, once in the ED, and once here on the floor.

## 2014-10-24 NOTE — Progress Notes (Signed)
Pt very dizzy when walking to the BR this afternoon. Pt was dizzy earlier, but it subsided when she sat on the side of the bed for a few minutes. This time, the dizziness did not subside, even after sitting on the side of the bed for a few minutes.

## 2014-10-24 NOTE — Progress Notes (Addendum)
Patient Demographics  Brenda Mcguire, is a 53 y.o. female, DOB - 03-29-1961, ZOX:[id number]  Admit date - 10/23/2014   Admitting Physician Yevonne PaxSaadat A Khan, MD  Outpatient Primary MD for the patient is Nicki ReaperBAITY, REGINA, NP  LOS - 1   Chief Complaint  Patient presents with  . Blurred Vision        Subjective:   Brenda Mcguire today has, No headache, No chest pain, No abdominal pain - No Nausea, No new weakness tingling or numbness, No Cough - SOB. Still has blurred vision in the right eye along with some tingling numbness in the right arm.  Assessment & Plan    1. 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 Per rheumatology continue oral Cytoxan along with IV Solu-Medrol at present dose. Check MRI to rule out stroke. Have also consulted ophthalmology Dr. Randon GoldsmithLyles.   2. Bechet's - discussed treatment with Dr. Dierdre ForthBeekman, currently continue oral Cytoxan and IV Solu-Medrol at present dose. Follow with primary rheumatologist outpatient.   3.Seizures - on Keppra and Depakote continue.   4.Neuropathy - continue Neurontin.     Code Status: full  Family Communication: none present  Disposition Plan: to be decided likely home   Procedures CT scan head, MRI brain under conscious sedation   Consults  Neuro, rheumatologist Dr. Dierdre ForthBeekman over the phone, ophthalmology Dr. Randon GoldsmithLyles   Medications  Scheduled Meds: . alteplase  2 mg Intracatheter Once  . aspirin EC  325 mg Oral Daily  . Cyclophosphamide  75 mg Oral QHS  . divalproex  500 mg Oral TID  . docusate sodium  100 mg Oral BID  . folic acid  1 mg Oral Daily  . gabapentin  900 mg Oral TID  . heparin   5,000 Units Subcutaneous 3 times per day  . levETIRAcetam  1,500 mg Oral BID  . methylPREDNISolone (SOLU-MEDROL) injection  250 mg Intravenous Q6H  . multivitamin with minerals  1 tablet Oral Daily  . sulfamethoxazole-trimethoprim  1 tablet Oral QODAY  . thiamine  100 mg Oral Daily   Continuous Infusions: . sodium chloride 50 mL/hr at 10/23/14 2023   PRN Meds:.ALPRAZolam, alum & mag hydroxide-simeth, bisacodyl, morphine injection, ondansetron **OR** ondansetron (ZOFRAN) IV, oxyCODONE, polyethylene glycol, sodium chloride, zolpidem  DVT Prophylaxis    Heparin    Lab Results  Component Value Date   PLT 306 10/24/2014    Antibiotics    Anti-infectives    Start     Dose/Rate Route Frequency Ordered Stop   10/24/14 1000  sulfamethoxazole-trimethoprim (BACTRIM DS,SEPTRA DS) 800-160 MG per tablet 1 tablet     1 tablet Oral Every other day 10/23/14 2011            Objective:   Filed Vitals:   10/23/14 2239 10/24/14 0213 10/24/14 0641 10/24/14 0947  BP: 118/60 123/73 109/67 124/85  Pulse: 73 82 69 75  Temp:  98.4 F (36.9 C) 98.6 F (37 C) 98.5 F (36.9 C)  TempSrc:  Oral Oral Oral  Resp: 18 18 20 20   Height:      Weight:      SpO2:  99% 93% 95% 98%    Wt Readings from Last 3 Encounters:  10/23/14 84.5 kg (186 lb 4.6 oz)  09/02/14 82.2 kg (181 lb 3.5 oz)  07/18/14 83.802 kg (184 lb 12 oz)    No intake or output data in the 24 hours ending 10/24/14 1126   Physical Exam  Awake Alert, Oriented X 3, No new F.N deficits, Normal affect, blurred vision in right eye, some right-sided tingling numbness [place].AT,right pupil is sluggish to react to light, which were lost in the right lateral visual field, vision in the right eye is blurry throughout Supple Neck,No JVD, No cervical lymphadenopathy appriciated.  Symmetrical Chest wall movement, Good air movement bilaterally, CTAB RRR,No Gallops,Rubs or new Murmurs, No Parasternal Heave +ve B.Sounds, Abd Soft, No tenderness, No  organomegaly appriciated, No rebound - guarding or rigidity. No Cyanosis, Clubbing or edema, No new Rash or bruise      Data Review   Micro Results No results found for this or any previous visit (from the past 240 hour(s)).  Radiology Reports Ct Head Wo Contrast  10/23/2014   CLINICAL DATA:  Five day history of headache. Intermittent blurred vision. Unequal pupils. Patient with Behcet's disease  EXAM: CT HEAD WITHOUT CONTRAST  TECHNIQUE: Contiguous axial images were obtained from the base of the skull through the vertex without intravenous contrast.  COMPARISON:  August 31, 2014  FINDINGS: The ventricles are normal in size and configuration. There is no mass, hemorrhage, extra-axial fluid collection, or midline shift. Gray-white compartments are normal. There is no demonstrable acute infarct. Bony calvarium appears intact. The mastoid air cells are clear.  IMPRESSION: Study within normal limits. No intracranial mass, hemorrhage, or focal gray -white compartment lesions/acute appearing infarct.   Electronically Signed   By: Bretta BangWilliam  Woodruff M.D.   On: 10/23/2014 15:08     CBC  Recent Labs Lab 10/23/14 1741 10/24/14 0715  WBC 7.7 6.0  HGB 12.0 11.7*  HCT 35.0* 34.2*  PLT 268 306  MCV 93.8 93.4  MCH 32.2 32.0  MCHC 34.3 34.2  RDW 12.1 12.2  LYMPHSABS 1.9  --   MONOABS 0.4  --   EOSABS 0.2  --   BASOSABS 0.0  --     Chemistries   Recent Labs Lab 10/23/14 1741 10/24/14 0715  NA 139 138  K 4.3 4.2  CL 104 103  CO2 19 19  GLUCOSE 91 176*  BUN 10 15  CREATININE 0.64 0.63  CALCIUM 9.4 9.2  AST  --  40*  ALT  --  55*  ALKPHOS  --  84  BILITOT  --  0.2*   ------------------------------------------------------------------------------------------------------------------ estimated creatinine clearance is 98.4 mL/min (by C-G formula based on Cr of  0.63). ------------------------------------------------------------------------------------------------------------------  Recent Labs  10/23/14 2051  HGBA1C 5.8*   ------------------------------------------------------------------------------------------------------------------ No results for input(s): CHOL, HDL, LDLCALC, TRIG, CHOLHDL, LDLDIRECT in the last 72 hours. ------------------------------------------------------------------------------------------------------------------  Recent Labs  10/23/14 2051  TSH 0.725   ------------------------------------------------------------------------------------------------------------------ No results for input(s): VITAMINB12, FOLATE, FERRITIN, TIBC, IRON, RETICCTPCT in the last 72 hours.  Coagulation profile No results for input(s): INR, PROTIME in the last 168 hours.  No results for input(s): DDIMER in the last 72 hours.  Cardiac Enzymes No results for input(s): CKMB, TROPONINI, MYOGLOBIN in the last 168 hours.  Invalid input(s): CK ------------------------------------------------------------------------------------------------------------------ Invalid input(s): POCBNP     Time Spent in minutes   35   SINGH,PRASHANT K M.D on 10/24/2014 at 11:26 AM  Between 7am to 7pm - Pager -  [phone]  After 7pm go to [email or web address] - password [email or web address]  And look for the night coverage person covering for me after hours  [facility] Office  [phone]

## 2014-10-24 NOTE — Progress Notes (Signed)
Nurse witnessed patient actively seizing.  Seizure lasted for 2 min 36 seconds.  Pt turned to side, oxygen via nasal canula started at 2L, vitals obtained, MD notified.  Orders received for Ativan 2mg  and Valproic acid level to be obtained.  Will continue to monitor.

## 2014-10-24 NOTE — Progress Notes (Signed)
Pt not dizzy when she went to the BR this evening. Will continue to monitor.

## 2014-10-24 NOTE — Telephone Encounter (Signed)
Last filled 08/29/14 with 1 refill--please advise

## 2014-10-24 NOTE — Progress Notes (Addendum)
RN paged secondary to pt having generalized seizure activity. Has a hx of seizures on Depakote and Keppra. Ativan 2mg  IV given stat. Depakote level ordered as none on chart since Sept. Pt is already followed by neuro for admit problem of vision loss OD and weakness and is presently being worked up for that. Spoke to neuro on call, Camillo, who agreed with Depakote level and "go from there". This NP called my [facility] and [facility] pt.  Kirby-Graham, NP [facility]  Addendum:  Valproic acid level back at 54.8. Neuro ordered change in dose and route for Depakote.

## 2014-10-24 NOTE — Plan of Care (Signed)
Problem: Phase I Progression Outcomes Goal: Pain controlled with appropriate interventions Outcome: Progressing     

## 2014-10-25 ENCOUNTER — Encounter (HOSPITAL_COMMUNITY): Payer: Self-pay | Admitting: Radiology

## 2014-10-25 ENCOUNTER — Telehealth: Payer: Self-pay | Admitting: Internal Medicine

## 2014-10-25 DIAGNOSIS — I6789 Other cerebrovascular disease: Secondary | ICD-10-CM

## 2014-10-25 LAB — VALPROIC ACID LEVEL: VALPROIC ACID LVL: 54.8 ug/mL (ref 50.0–100.0)

## 2014-10-25 MED ORDER — METHYLPREDNISOLONE SODIUM SUCC 1000 MG IJ SOLR
500.0000 mg | Freq: Two times a day (BID) | INTRAMUSCULAR | Status: DC
  Administered 2014-10-25 – 2014-10-26 (×3): 500 mg via INTRAVENOUS
  Filled 2014-10-25 (×5): qty 4

## 2014-10-25 MED ORDER — DEXTROSE 5 % IV SOLN
500.0000 mg | Freq: Three times a day (TID) | INTRAVENOUS | Status: DC
  Administered 2014-10-25 – 2014-10-26 (×6): 500 mg via INTRAVENOUS
  Filled 2014-10-25 (×8): qty 5

## 2014-10-25 MED ORDER — LEVETIRACETAM IN NACL 1500 MG/100ML IV SOLN
1500.0000 mg | Freq: Two times a day (BID) | INTRAVENOUS | Status: DC
  Administered 2014-10-25 – 2014-10-26 (×4): 1500 mg via INTRAVENOUS
  Filled 2014-10-25 (×5): qty 100

## 2014-10-25 MED ORDER — MORPHINE SULFATE 2 MG/ML IJ SOLN
1.0000 mg | INTRAMUSCULAR | Status: DC | PRN
Start: 2014-10-25 — End: 2014-10-26
  Administered 2014-10-25 – 2014-10-26 (×5): 1 mg via INTRAVENOUS
  Filled 2014-10-25 (×6): qty 1

## 2014-10-25 NOTE — Progress Notes (Signed)
Patient Demographics  Brenda Mcguire, is a 53 y.o. female, DOB - 1961-11-05, ZOX:[id number]  Admit date - 10/23/2014   Admitting Physician Yevonne Pax, MD  Outpatient Primary MD for the patient is Nicki Reaper, NP  LOS - 2   Chief Complaint  Patient presents with  . Blurred Vision        Subjective:   Tyeesha Riker today has, No headache, No chest pain, No abdominal pain - No Nausea, No new weakness tingling or numbness, No Cough - SOB.   Still has blurred vision in the right eye along with some tingling numbness in the right arm.  Assessment & Plan    1. 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 Per rheumatology continue oral Cytoxan along with IV Solu-Medrol at present dose. Non Acute MRI ruled out stroke. Seen by ophthalmology Dr. Randon Goldsmith who wants outpatient follow-up. Discussed with Dr. 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    2. Bechet's - discussed treatment with Dr. Dierdre Forth, currently continue oral Cytoxan and IV Solu-Medrol at present dose. Follow with primary rheumatologist outpatient Dr Concha Se.    3.Seizures - On Keppra and Depakote continue. 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    4.Neuropathy - continue Neurontin.      Code Status: full  Family Communication: none present  Disposition Plan: to be decided likely home    Procedures CT scan head, MRI brain under conscious sedation, pending carotid and echogram   Consults  Neuro, rheumatologist Dr. Dierdre Forth over the phone, ophthalmology Dr. Randon Goldsmith   Medications  Scheduled Meds: . alteplase  2 mg  Intracatheter Once  . aspirin EC  325 mg Oral Daily  . Cyclophosphamide  75 mg Oral QHS  . docusate sodium  100 mg Oral BID  . folic acid  1 mg Oral Daily  . gabapentin  900 mg Oral TID  . heparin  5,000 Units Subcutaneous 3 times per day  . levETIRAcetam  1,500 mg Intravenous Q12H  . LORazepam      . methylPREDNISolone (SOLU-MEDROL) injection  250 mg Intravenous Q6H  . multivitamin with minerals  1 tablet Oral Daily  . sulfamethoxazole-trimethoprim  1 tablet Oral QODAY  . thiamine  100 mg Oral Daily  . valproate sodium  500 mg Intravenous 3 times per day   Continuous Infusions: . sodium chloride 50 mL/hr at 10/23/14 2023   PRN Meds:.ALPRAZolam, alum & mag hydroxide-simeth, bisacodyl, LORazepam, morphine injection, ondansetron **OR** ondansetron (ZOFRAN) IV, oxyCODONE, polyethylene glycol, sodium chloride, zolpidem  DVT Prophylaxis    Heparin    Lab Results  Component Value Date   PLT 306 10/24/2014    Antibiotics    Anti-infectives    Start     Dose/Rate Route Frequency Ordered Stop   10/24/14 1000  sulfamethoxazole-trimethoprim (BACTRIM DS,SEPTRA DS) 800-160 MG per tablet 1 tablet     1 tablet Oral Every other day 10/23/14 2011            Objective:   Filed Vitals:   10/24/14 2301 10/24/14 2315 10/25/14 0131 10/25/14 0528  BP: 140/92  111/66 117/65  Pulse: 115  96 85  Temp:  99.4 F (37.4 C) 98.4 F (36.9 C) 98.5 F (36.9 C)  TempSrc:  Rectal Oral Oral  Resp: 16  16 20   Height:      Weight:      SpO2: 96%  92% 98%    Wt Readings from Last 3 Encounters:  10/23/14 84.5 kg (186 lb 4.6 oz)  09/02/14 82.2 kg (181 lb 3.5 oz)  07/18/14 83.802 kg (184 lb 12 oz)     Intake/Output Summary (Last 24 hours) at 10/25/14 0936 Last data filed at 10/24/14 1245  Gross per 24 hour  Intake    300 ml  Output      0 ml  Net    300 ml     Physical Exam  Awake Alert, Oriented X 3, No new F.N deficits, Normal affect, blurred vision in right eye, some right-sided  tingling numbness [place].AT,right pupil isdilated and sluggish to react to light, which were lost in the right lateral visual field, vision in the right eye is blurry throughout  Supple Neck,No JVD, No cervical lymphadenopathy appriciated.  Symmetrical Chest wall movement, Good air movement bilaterally, CTAB RRR,No Gallops,Rubs or new Murmurs, No Parasternal Heave +ve B.Sounds, Abd Soft, No tenderness, No organomegaly appriciated, No rebound - guarding or rigidity. No Cyanosis, Clubbing or edema, No new Rash or bruise      Data Review   Micro Results No results found for this or any previous visit (from the past 240 hour(s)).  Radiology Reports Ct Head Wo Contrast  10/23/2014   CLINICAL DATA:  Five day history of headache. Intermittent blurred vision. Unequal pupils. Patient with Behcet's disease  EXAM: CT HEAD WITHOUT CONTRAST  TECHNIQUE: Contiguous axial images were obtained from the base of the skull through the vertex without intravenous contrast.  COMPARISON:  August 31, 2014  FINDINGS: The ventricles are normal in size and configuration. There is no mass, hemorrhage, extra-axial fluid collection, or midline shift. Gray-white compartments are normal. There is no demonstrable acute infarct. Bony calvarium appears intact. The mastoid air cells are clear.  IMPRESSION: Study within normal limits. No intracranial mass, hemorrhage, or focal gray -white compartment lesions/acute appearing infarct.   Electronically Signed   By: Bretta BangWilliam  Woodruff M.D.   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 Vision the right eye remains blurry. P biliary dilation scratch the the right pupil is dilated. During the course in the emergency department patient developed right-sided weakness in the upper and lower extremity.  EXAM: MRI HEAD WITHOUT CONTRAST  MRA HEAD WITHOUT CONTRAST  MRV  HEAD WITHOUT CONTRAST  TECHNIQUE: Multiplanar, multiecho pulse sequences of the brain and surrounding structures were obtained without intravenous contrast. Angiographic images of the head were obtained using MRA technique without contrast. Angiographic images of the intracranial venous structures were obtained using MRV technique without intravenous contrast.  COMPARISON:  CT head without contrast 10/23/2014.  FINDINGS: MRI HEAD FINDINGS  The diffusion-weighted images demonstrate no evidence for acute or subacute infarction. No acute hemorrhage or mass lesion is evident. Scattered subcortical T2 hyperintensities in the anterior frontal lobes bilaterally are slightly greater than expected for age no confluent white matter changes are evident. Flow is present in the major intracranial arteries. The globes and orbits are intact. There is symmetric size and signal of the optic  nerves bilaterally. The extraocular muscles are within normal limits.  The paranasal sinuses and mastoid air cells are clear. Flow is present in the major intracranial arteries. Midline structures are within normal limits.  MRA HEAD FINDINGS  The internal carotid arteries are within normal limits from the high cervical segments through the ICA termini bilaterally. The A1 and M1 segments are normal. The MCA bifurcations are within normal limits. ACA and MCA branch vessels are within normal limits.  The left vertebral artery is the dominant vessel. The PICA origins are visualized and normal bilaterally. The basilar artery is normal. Both posterior cerebral arteries originate from the basilar tip. The PCA branch vessels are within normal limits.  MRV HEAD FINDINGS  Major dural sinuses are patent. There is however, very limited flow within the left transverse sinus. The superior sagittal sinus an inferior sagittal sinus is patent. The right transverse sinus is patent. The torcula herophilii is midline suggesting non dominance of the transverse sinus.  The left sigmoid sinus and internal jugular vein are present, the smaller than on the right. The dural veins are patent.  IMPRESSION: 1. Poor visualization of the left transverse sinus may represent stenosis of the sinus. There is no definite thrombus. 2. Minimal scattered subcortical hyperintensities anteriorly are slightly greater than expected for age. These are not classic for a Behcet's. The finding is nonspecific but can be seen in the setting of chronic microvascular ischemia, a demyelinating process such as multiple sclerosis, vasculitis, complicated migraine headaches, or as the sequelae of a prior infectious or inflammatory process. 3. Otherwise normal MRI of the brain. 4. Normal variant MRA circle of Willis without evidence for significant proximal stenosis, aneurysm, or branch vessel occlusion. 5. Otherwise normal MRV without evidence for focal thrombosis.   Electronically Signed   By: Gennette Pac M.D.   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 Vision the right eye remains blurry. P biliary dilation scratch the the right pupil is dilated. During the course in the emergency department patient developed right-sided weakness in the upper and lower extremity.  EXAM: MRI HEAD WITHOUT CONTRAST  MRA HEAD WITHOUT CONTRAST  MRV HEAD WITHOUT CONTRAST  TECHNIQUE: Multiplanar, multiecho pulse sequences of the brain and surrounding structures were obtained without intravenous contrast. Angiographic images of the head were obtained using MRA technique without contrast. Angiographic images of the intracranial venous structures were obtained using MRV technique without intravenous contrast.  COMPARISON:  CT head without contrast 10/23/2014.  FINDINGS: MRI HEAD FINDINGS  The diffusion-weighted images demonstrate no evidence for acute or subacute infarction. No acute hemorrhage or mass  lesion is evident. Scattered subcortical T2 hyperintensities in the anterior frontal lobes bilaterally are slightly greater than expected for age no confluent white matter changes are evident. Flow is present in the major intracranial arteries. The globes and orbits are intact. There is symmetric size and signal of the optic nerves bilaterally. The extraocular muscles are within normal limits.  The paranasal sinuses and mastoid air cells are clear. Flow is present in the major intracranial arteries. Midline structures are within normal limits.  MRA HEAD FINDINGS  The internal carotid arteries are within normal limits from the high cervical segments through the ICA termini bilaterally. The A1 and M1 segments are normal. The MCA bifurcations are within normal limits. ACA and MCA branch vessels are within normal limits.  The left vertebral artery is the dominant vessel. The PICA origins are visualized and normal bilaterally. The basilar artery is normal. Both posterior cerebral arteries originate from the basilar tip. The PCA branch vessels are within normal limits.  MRV HEAD FINDINGS  Major dural sinuses are patent. There is however, very limited flow within the left transverse sinus. The superior sagittal sinus an inferior sagittal sinus is patent. The right transverse sinus is patent. The torcula herophilii is midline suggesting non dominance of the transverse sinus. The left sigmoid sinus and internal jugular vein are present, the smaller than on the right. The dural veins are patent.  IMPRESSION: 1. Poor visualization of the left transverse sinus may represent stenosis of the sinus. There is no definite thrombus. 2. Minimal scattered subcortical hyperintensities anteriorly are slightly greater than expected for age. These are not classic for a Behcet's. The finding is nonspecific but can be seen in the setting of chronic microvascular ischemia, a demyelinating process such as multiple sclerosis, vasculitis,  complicated migraine headaches, or as the sequelae of a prior infectious or inflammatory process. 3. Otherwise normal MRI of the brain. 4. Normal variant MRA circle of Willis without evidence for significant proximal stenosis, aneurysm, or branch vessel occlusion. 5. Otherwise normal MRV without evidence for focal thrombosis.   Electronically Signed   By: Gennette Pac M.D.   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 Vision the right eye remains blurry. P biliary dilation scratch the the right pupil is dilated. During the course in the emergency department patient developed right-sided weakness in the upper and lower extremity.  EXAM: MRI HEAD WITHOUT CONTRAST  MRA HEAD WITHOUT CONTRAST  MRV HEAD WITHOUT CONTRAST  TECHNIQUE: Multiplanar, multiecho pulse sequences of the brain and surrounding structures were obtained without intravenous contrast. Angiographic images of the head were obtained using MRA technique without contrast. Angiographic images of the intracranial venous structures were obtained using MRV technique without intravenous contrast.  COMPARISON:  CT head without contrast 10/23/2014.  FINDINGS: MRI HEAD FINDINGS  The diffusion-weighted images demonstrate no evidence for acute or subacute infarction. No acute hemorrhage or mass lesion is evident. Scattered subcortical T2 hyperintensities in the anterior frontal lobes bilaterally are slightly greater than expected for age no confluent white matter changes are evident. Flow is present in the major intracranial arteries. The globes and orbits are intact. There is symmetric size and signal of the optic nerves bilaterally. The extraocular muscles are within normal limits.  The paranasal sinuses and mastoid air cells are clear. Flow is present in the major intracranial arteries. Midline structures are within normal limits.  MRA HEAD  FINDINGS  The internal carotid arteries are within normal limits from the high cervical segments through the ICA termini bilaterally. The A1 and M1 segments are normal. The MCA bifurcations are within normal limits. ACA and MCA branch vessels are within normal limits.  The left vertebral artery is the dominant vessel. The PICA origins are visualized and normal bilaterally. The basilar artery is normal. Both posterior cerebral arteries originate from the basilar tip. The PCA branch vessels are within normal limits.  MRV HEAD FINDINGS  Major dural sinuses are patent. There is however, very limited flow within the left transverse sinus. The superior sagittal sinus an inferior sagittal sinus is patent. The right transverse sinus is patent. The torcula herophilii is midline suggesting  non dominance of the transverse sinus. The left sigmoid sinus and internal jugular vein are present, the smaller than on the right. The dural veins are patent.  IMPRESSION: 1. Poor visualization of the left transverse sinus may represent stenosis of the sinus. There is no definite thrombus. 2. Minimal scattered subcortical hyperintensities anteriorly are slightly greater than expected for age. These are not classic for a Behcet's. The finding is nonspecific but can be seen in the setting of chronic microvascular ischemia, a demyelinating process such as multiple sclerosis, vasculitis, complicated migraine headaches, or as the sequelae of a prior infectious or inflammatory process. 3. Otherwise normal MRI of the brain. 4. Normal variant MRA circle of Willis without evidence for significant proximal stenosis, aneurysm, or branch vessel occlusion. 5. Otherwise normal MRV without evidence for focal thrombosis.   Electronically Signed   By: Gennette Pachris  Mattern M.D.   On: 10/24/2014 13:15     CBC  Recent Labs Lab 10/23/14 1741 10/24/14 0715  WBC 7.7 6.0  HGB 12.0 11.7*  HCT 35.0* 34.2*  PLT 268 306  MCV 93.8 93.4  MCH 32.2 32.0  MCHC  34.3 34.2  RDW 12.1 12.2  LYMPHSABS 1.9  --   MONOABS 0.4  --   EOSABS 0.2  --   BASOSABS 0.0  --     Chemistries   Recent Labs Lab 10/23/14 1741 10/24/14 0715  NA 139 138  K 4.3 4.2  CL 104 103  CO2 19 19  GLUCOSE 91 176*  BUN 10 15  CREATININE 0.64 0.63  CALCIUM 9.4 9.2  AST  --  40*  ALT  --  55*  ALKPHOS  --  84  BILITOT  --  0.2*   ------------------------------------------------------------------------------------------------------------------ estimated creatinine clearance is 98.4 mL/min (by C-G formula based on Cr of 0.63). ------------------------------------------------------------------------------------------------------------------  Recent Labs  10/23/14 2051  HGBA1C 5.8*   ------------------------------------------------------------------------------------------------------------------ No results for input(s): CHOL, HDL, LDLCALC, TRIG, CHOLHDL, LDLDIRECT in the last 72 hours. ------------------------------------------------------------------------------------------------------------------  Recent Labs  10/23/14 2051  TSH 0.725   ------------------------------------------------------------------------------------------------------------------ No results for input(s): VITAMINB12, FOLATE, FERRITIN, TIBC, IRON, RETICCTPCT in the last 72 hours.  Coagulation profile No results for input(s): INR, PROTIME in the last 168 hours.  No results for input(s): DDIMER in the last 72 hours.  Cardiac Enzymes No results for input(s): CKMB, TROPONINI, MYOGLOBIN in the last 168 hours.  Invalid input(s): CK ------------------------------------------------------------------------------------------------------------------ Invalid input(s): POCBNP     Time Spent in minutes   35   SINGH,PRASHANT K M.D on 10/25/2014 at 9:36 AM  Between 7am to 7pm - Pager - [phone]  After 7pm go to [email or web address] - password [email or web address]  And look for the night coverage person  covering for me after hours  [facility] Office  [phone]

## 2014-10-25 NOTE — Telephone Encounter (Signed)
Pt has been admitted to hospital

## 2014-10-25 NOTE — Progress Notes (Signed)
Pt had another seizure, lasting 14 minutes. Ativan 2 mg x1 given. Vital signs obtained. MD notified; awaiting further orders. Will continue to monitor.

## 2014-10-25 NOTE — Telephone Encounter (Signed)
Pt called in today to notify is that she is now in the [facility] She had a flair up, had 3 seizures, and her right pupil "is blown". She was previously seeing Dr Zelphia Cairoreslow for rheumatology but does not like him at all and would like to start seeing Cheryll CockayneAngela Hawkins and wants to try to be seen asap by her.  They are planning to put her on high dose steroids and start IV chemo.

## 2014-10-25 NOTE — Telephone Encounter (Signed)
I have done 3 previous referral for her to rheumatology in the past. I am not placing another referral for her.

## 2014-10-25 NOTE — Progress Notes (Signed)
Dr Leroy Kennedyamilo notified of Valproic Acid level of 54.8.  Received orders to change Depakote and Keppra from PO to IV and to notify MD of any changes in patients condition.

## 2014-10-25 NOTE — Progress Notes (Signed)
Pt still complaining of head pain 10/10 after 1mg  Morphine and 5mg  PO Oxy IR.  Pt also complaining of nausea and vomiting not relieved by Zofran.  MD notified and order received for one time dose of morphine 1mg .  Will continue to monitor.

## 2014-10-25 NOTE — Progress Notes (Signed)
Subjective: Patient reports that she is unchanged.  Has been evaluated by Ophthalmology who report a normal eye exam.  No disc edema or evidence of hemorrhage noted.  Unclear etiology of decreased vision.  Pupil remains dilated.   Overnight with two episodes of "seizure".  These were witnessed by staff and felt to be of nonepileptic origin.  Depakote continued.    Objective: Current vital signs: BP 117/65 mmHg  Pulse 85  Temp(Src) 98.5 F (36.9 C) (Oral)  Resp 20  Ht 5\' 10"  (1.778 m)  Wt 84.5 kg (186 lb 4.6 oz)  BMI 26.73 kg/m2  SpO2 98% Vital signs in last 24 hours: Temp:  [98.4 F (36.9 C)-99.4 F (37.4 C)] 98.5 F (36.9 C) (11/12 0528) Pulse Rate:  [75-115] 85 (11/12 0528) Resp:  [16-22] 20 (11/12 0528) BP: (108-140)/(65-92) 117/65 mmHg (11/12 0528) SpO2:  [92 %-98 %] 98 % (11/12 0528)  Intake/Output from previous day: 11/11 0701 - 11/12 0700 In: 300 [I.V.:300] Out: -  Intake/Output this shift:   Nutritional status: Diet Carb Modified  Neurologic Exam: Mental Status: Alert, oriented, thought content appropriate. Speech fluent without evidence of aphasia. Able to follow 3 step commands without difficulty. Cranial Nerves: II: Due to photosensitivity patient will not allow me to view her eyes. Right eye 6mm and unreactive.  Left eye 4mm and reactive.  III,IV, VI: ptosis not present, extra-ocular motions intact bilaterally V,VII: smile symmetric, facial light touch sensation normal bilaterally VIII: hearing normal bilaterally IX,X: gag reflex present XI: bilateral shoulder shrug XII: midline tongue extension Motor: 5/5 throughout  Deep Tendon Reflexes: 2+ and symmetric throughout Plantars: Right: downgoingLeft: downgoing Cerebellar: normal finger-to-nose and normal heel-to-shin test   Lab Results: Basic Metabolic Panel:  Recent Labs Lab 10/23/14 1741 10/24/14 0715  NA 139 138  K 4.3 4.2  CL 104 103  CO2 19 19   GLUCOSE 91 176*  BUN 10 15  CREATININE 0.64 0.63  CALCIUM 9.4 9.2    Liver Function Tests:  Recent Labs Lab 10/24/14 0715  AST 40*  ALT 55*  ALKPHOS 84  BILITOT 0.2*  PROT 6.8  ALBUMIN 3.5   No results for input(s): LIPASE, AMYLASE in the last 168 hours. No results for input(s): AMMONIA in the last 168 hours.  CBC:  Recent Labs Lab 10/23/14 1741 10/24/14 0715  WBC 7.7 6.0  NEUTROABS 5.2  --   HGB 12.0 11.7*  HCT 35.0* 34.2*  MCV 93.8 93.4  PLT 268 306    Cardiac Enzymes: No results for input(s): CKTOTAL, CKMB, CKMBINDEX, TROPONINI in the last 168 hours.  Lipid Panel: No results for input(s): CHOL, TRIG, HDL, CHOLHDL, VLDL, LDLCALC in the last 168 hours.  CBG:  Recent Labs Lab 10/24/14 0758  GLUCAP 155*    Microbiology: Results for orders placed or performed during the hospital encounter of 08/31/14  MRSA PCR Screening     Status: None   Collection Time: 08/31/14  8:15 PM  Result Value Ref Range Status   MRSA by PCR NEGATIVE NEGATIVE Final    Comment:        The GeneXpert MRSA Assay (FDA approved for NASAL specimens only), is one component of a comprehensive MRSA colonization surveillance program. It is not intended to diagnose MRSA infection nor to guide or monitor treatment for MRSA infections.    Coagulation Studies: No results for input(s): LABPROT, INR in the last 72 hours.  Imaging: Ct Head Wo Contrast  10/23/2014   CLINICAL DATA:  Five day history  of headache. Intermittent blurred vision. Unequal pupils. Patient with Behcet's disease  EXAM: CT HEAD WITHOUT CONTRAST  TECHNIQUE: Contiguous axial images were obtained from the base of the skull through the vertex without intravenous contrast.  COMPARISON:  August 31, 2014  FINDINGS: The ventricles are normal in size and configuration. There is no mass, hemorrhage, extra-axial fluid collection, or midline shift. Gray-white compartments are normal. There is no demonstrable acute infarct.  Bony calvarium appears intact. The mastoid air cells are clear.  IMPRESSION: Study within normal limits. No intracranial mass, hemorrhage, or focal gray -white compartment lesions/acute appearing infarct.   Electronically Signed   By: Bretta BangWilliam  Woodruff M.D.   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 Vision the right eye remains blurry. P biliary dilation scratch the the right pupil is dilated. During the course in the emergency department patient developed right-sided weakness in the upper and lower extremity.  EXAM: MRI HEAD WITHOUT CONTRAST  MRA HEAD WITHOUT CONTRAST  MRV HEAD WITHOUT CONTRAST  TECHNIQUE: Multiplanar, multiecho pulse sequences of the brain and surrounding structures were obtained without intravenous contrast. Angiographic images of the head were obtained using MRA technique without contrast. Angiographic images of the intracranial venous structures were obtained using MRV technique without intravenous contrast.  COMPARISON:  CT head without contrast 10/23/2014.  FINDINGS: MRI HEAD FINDINGS  The diffusion-weighted images demonstrate no evidence for acute or subacute infarction. No acute hemorrhage or mass lesion is evident. Scattered subcortical T2 hyperintensities in the anterior frontal lobes bilaterally are slightly greater than expected for age no confluent white matter changes are evident. Flow is present in the major intracranial arteries. The globes and orbits are intact. There is symmetric size and signal of the optic nerves bilaterally. The extraocular muscles are within normal limits.  The paranasal sinuses and mastoid air cells are clear. Flow is present in the major intracranial arteries. Midline structures are within normal limits.  MRA HEAD FINDINGS  The internal carotid arteries are within normal limits from the high cervical segments through the ICA  termini bilaterally. The A1 and M1 segments are normal. The MCA bifurcations are within normal limits. ACA and MCA branch vessels are within normal limits.  The left vertebral artery is the dominant vessel. The PICA origins are visualized and normal bilaterally. The basilar artery is normal. Both posterior cerebral arteries originate from the basilar tip. The PCA branch vessels are within normal limits.  MRV HEAD FINDINGS  Major dural sinuses are patent. There is however, very limited flow within the left transverse sinus. The superior sagittal sinus an inferior sagittal sinus is patent. The right transverse sinus is patent. The torcula herophilii is midline suggesting non dominance of the transverse sinus. The left sigmoid sinus and internal jugular vein are present, the smaller than on the right. The dural veins are patent.  IMPRESSION: 1. Poor visualization of the left transverse sinus may represent stenosis of the sinus. There is no definite thrombus. 2. Minimal scattered subcortical hyperintensities anteriorly are slightly greater than expected for age. These are not classic for a Behcet's. The finding is nonspecific but can be seen in the setting of chronic microvascular ischemia, a demyelinating process such as multiple sclerosis, vasculitis, complicated migraine headaches, or as the sequelae of a prior infectious or inflammatory process. 3. Otherwise normal MRI of the brain. 4. Normal variant MRA circle  of Willis without evidence for significant proximal stenosis, aneurysm, or branch vessel occlusion. 5. Otherwise normal MRV without evidence for focal thrombosis.   Electronically Signed   By: Gennette Pac M.D.   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 Vision the right eye remains blurry. P biliary dilation scratch the the right pupil is dilated. During the course in the  emergency department patient developed right-sided weakness in the upper and lower extremity.  EXAM: MRI HEAD WITHOUT CONTRAST  MRA HEAD WITHOUT CONTRAST  MRV HEAD WITHOUT CONTRAST  TECHNIQUE: Multiplanar, multiecho pulse sequences of the brain and surrounding structures were obtained without intravenous contrast. Angiographic images of the head were obtained using MRA technique without contrast. Angiographic images of the intracranial venous structures were obtained using MRV technique without intravenous contrast.  COMPARISON:  CT head without contrast 10/23/2014.  FINDINGS: MRI HEAD FINDINGS  The diffusion-weighted images demonstrate no evidence for acute or subacute infarction. No acute hemorrhage or mass lesion is evident. Scattered subcortical T2 hyperintensities in the anterior frontal lobes bilaterally are slightly greater than expected for age no confluent white matter changes are evident. Flow is present in the major intracranial arteries. The globes and orbits are intact. There is symmetric size and signal of the optic nerves bilaterally. The extraocular muscles are within normal limits.  The paranasal sinuses and mastoid air cells are clear. Flow is present in the major intracranial arteries. Midline structures are within normal limits.  MRA HEAD FINDINGS  The internal carotid arteries are within normal limits from the high cervical segments through the ICA termini bilaterally. The A1 and M1 segments are normal. The MCA bifurcations are within normal limits. ACA and MCA branch vessels are within normal limits.  The left vertebral artery is the dominant vessel. The PICA origins are visualized and normal bilaterally. The basilar artery is normal. Both posterior cerebral arteries originate from the basilar tip. The PCA branch vessels are within normal limits.  MRV HEAD FINDINGS  Major dural sinuses are patent. There is however, very limited flow within the left transverse sinus. The superior sagittal sinus  an inferior sagittal sinus is patent. The right transverse sinus is patent. The torcula herophilii is midline suggesting non dominance of the transverse sinus. The left sigmoid sinus and internal jugular vein are present, the smaller than on the right. The dural veins are patent.  IMPRESSION: 1. Poor visualization of the left transverse sinus may represent stenosis of the sinus. There is no definite thrombus. 2. Minimal scattered subcortical hyperintensities anteriorly are slightly greater than expected for age. These are not classic for a Behcet's. The finding is nonspecific but can be seen in the setting of chronic microvascular ischemia, a demyelinating process such as multiple sclerosis, vasculitis, complicated migraine headaches, or as the sequelae of a prior infectious or inflammatory process. 3. Otherwise normal MRI of the brain. 4. Normal variant MRA circle of Willis without evidence for significant proximal stenosis, aneurysm, or branch vessel occlusion. 5. Otherwise normal MRV without evidence for focal thrombosis.   Electronically Signed   By: Gennette Pac M.D.   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 Vision the right eye remains blurry. P biliary dilation scratch the the right pupil  is dilated. During the course in the emergency department patient developed right-sided weakness in the upper and lower extremity.  EXAM: MRI HEAD WITHOUT CONTRAST  MRA HEAD WITHOUT CONTRAST  MRV HEAD WITHOUT CONTRAST  TECHNIQUE: Multiplanar, multiecho pulse sequences of the brain and surrounding structures were obtained without intravenous contrast. Angiographic images of the head were obtained using MRA technique without contrast. Angiographic images of the intracranial venous structures were obtained using MRV technique without intravenous contrast.  COMPARISON:  CT head without contrast  10/23/2014.  FINDINGS: MRI HEAD FINDINGS  The diffusion-weighted images demonstrate no evidence for acute or subacute infarction. No acute hemorrhage or mass lesion is evident. Scattered subcortical T2 hyperintensities in the anterior frontal lobes bilaterally are slightly greater than expected for age no confluent white matter changes are evident. Flow is present in the major intracranial arteries. The globes and orbits are intact. There is symmetric size and signal of the optic nerves bilaterally. The extraocular muscles are within normal limits.  The paranasal sinuses and mastoid air cells are clear. Flow is present in the major intracranial arteries. Midline structures are within normal limits.  MRA HEAD FINDINGS  The internal carotid arteries are within normal limits from the high cervical segments through the ICA termini bilaterally. The A1 and M1 segments are normal. The MCA bifurcations are within normal limits. ACA and MCA branch vessels are within normal limits.  The left vertebral artery is the dominant vessel. The PICA origins are visualized and normal bilaterally. The basilar artery is normal. Both posterior cerebral arteries originate from the basilar tip. The PCA branch vessels are within normal limits.  MRV HEAD FINDINGS  Major dural sinuses are patent. There is however, very limited flow within the left transverse sinus. The superior sagittal sinus an inferior sagittal sinus is patent. The right transverse sinus is patent. The torcula herophilii is midline suggesting non dominance of the transverse sinus. The left sigmoid sinus and internal jugular vein are present, the smaller than on the right. The dural veins are patent.  IMPRESSION: 1. Poor visualization of the left transverse sinus may represent stenosis of the sinus. There is no definite thrombus. 2. Minimal scattered subcortical hyperintensities anteriorly are slightly greater than expected for age. These are not classic for a Behcet's. The  finding is nonspecific but can be seen in the setting of chronic microvascular ischemia, a demyelinating process such as multiple sclerosis, vasculitis, complicated migraine headaches, or as the sequelae of a prior infectious or inflammatory process. 3. Otherwise normal MRI of the brain. 4. Normal variant MRA circle of Willis without evidence for significant proximal stenosis, aneurysm, or branch vessel occlusion. 5. Otherwise normal MRV without evidence for focal thrombosis.   Electronically Signed   By: Gennette Pac M.D.   On: 10/24/2014 13:15    Medications:  I have reviewed the patient's current medications. Scheduled: . alteplase  2 mg Intracatheter Once  . aspirin EC  325 mg Oral Daily  . Cyclophosphamide  75 mg Oral QHS  . docusate sodium  100 mg Oral BID  . folic acid  1 mg Oral Daily  . gabapentin  900 mg Oral TID  . heparin  5,000 Units Subcutaneous 3 times per day  . levETIRAcetam  1,500 mg Intravenous Q12H  . LORazepam      . methylPREDNISolone (SOLU-MEDROL) injection  250 mg Intravenous Q6H  . multivitamin with minerals  1 tablet Oral Daily  . sulfamethoxazole-trimethoprim  1 tablet Oral QODAY  . thiamine  100 mg  Oral Daily  . valproate sodium  500 mg Intravenous 3 times per day    Assessment/Plan: Patient not felt to have evidence of optic neuritis on ophthalmological examination.  Concerns remains for decreased vision.  Patient remains on Solumedrol.  Has received 6/12 doses.  Remains on Cyclophosphamide as well.    Recommendations: 1.  Would continue Solumedrol for 3 days. Last dose tomorrow afternoon.  Patient will not require an oral taper.   2.  Patient to follow up with neurology on an outpatient basis.     LOS: 2 days   Thana FarrLeslie Melyna Huron, MD [facility] [phone] 10/25/2014  9:21 AM

## 2014-10-25 NOTE — Progress Notes (Signed)
Chaplain responded to spiritual care consult and page from pt nurse. Pt narrated her life story and illness journey to chaplain. Chaplain encouraged focus on needs of the self. Pt found chaplain visit very helpful and is considering counseling services. Chaplain encouraged pt to page chaplains as she needs them. Pt and chaplain explored spiritual themes of God's love and God wanting a light burden. Chaplain read Bible passage and encouraged pt to have relaxed and focused breathing during the reading. Chaplain and pt prayed together. Chaplain intends to follow up on Monday is pt is here. Page on-call chaplain as needed [phone].   10/25/14 2100  Clinical Encounter Type  Visited With Patient  Visit Type Spiritual support;Initial  Referral From Nurse  Consult/Referral To Chaplain  Recommendations Follow Up  Spiritual Encounters  Spiritual Needs Emotional;Grief support;Prayer;Sacred text  Stress Factors  Patient Stress Factors Family relationships;Health changes;Major life changes;Loss of control  Ivin Rosenbloom, Loa SocksCourtney F, Chaplain 10/25/2014 9:45 PM

## 2014-10-25 NOTE — Progress Notes (Addendum)
Pt had second seizure episode, lasting 7 minutes. Bedside RN, MD at bedside. Vital signs obtained. No further orders given at this time. Will continue to monitor.

## 2014-10-25 NOTE — Progress Notes (Signed)
RN witnessed second tonic/clonic type seizure by patient that lasted roughly 3min.  Pt was turned to her side and Rapid Response as well as Dr Leroy Kennedyamilo in Neurology were notified.  Received order for 1mg  Ativan if patient seized again and a follow up with lab to draw valproic acid levels stat.

## 2014-10-25 NOTE — Progress Notes (Signed)
Event: Notified by my colleague on [facility] campus re: pt having seizure activity. Pt received Ativan 2 mg IV and a Depakote level was drawn. Neurology already following for admission problem, loss of vision in OD. NP to bedside.  Subjective: RN reported pt had a tonic/clonic type seizure that lasted approx 2.5 minutes which was followed by a post-ictal phase. Pt had reported (R) sided H/A prior to onset of seizure per RN and continued to report persistent (R) sided H/A at the time of my arrival to bedside. Also reported some persistent nausea. Pt states she takes her seizure medication as directed and has not had a seizure since September.  Objective: Wandra MannanWanda Brummond is a 53 y.o. 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 The loss of vision was proceeded by an atypical (R) sided h/a that she had been experiencing over a 4 day period. Neurology and Opthalmology are following. At bedside pt is noted drowsy but easily aroused and oriented to person and place. She moves all extremities x 4 and can follow commands. VSS.  Assessment/Plan: 1. 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 Depakote level is low normal at 54.8. At the time of this note informed by RN that pt has had two additional episodes of seizure activity. Dr Cyril Mourningamillo w/ neurology service saw pt at bedside and has changed her po seizure medications to IV. Will give IV Benadryl, Reglan and Toradol for h/a. Will continue to monitor closely.   Leanne ChangKatherine P. Khaden Gater, NP-C [facility] Pager [phone]

## 2014-10-25 NOTE — Progress Notes (Addendum)
Pt had a third episode of seizure activity, lasting 8 minutes. Sats were in the mid to high 90s the whole time; HR went into the 130s, then came back into the low 100s. Pt had small amount of emesis at the end of the seizure episode. Pt states that she has a terrible headache and doesn't know what happened. She doesn't remember taking her medicine just a couple of minutes before the first seizure, or any of the seizure activity. MD notified. Will continue to monitor.

## 2014-10-25 NOTE — Progress Notes (Signed)
Dr Leroy Kennedyamilo on floor to see patient regarding seizure activity.

## 2014-10-25 NOTE — Progress Notes (Signed)
  Echocardiogram 2D Echocardiogram has been performed.  Oshay Stranahan FRANCES 10/25/2014, 12:42 PM

## 2014-10-25 NOTE — Progress Notes (Signed)
*  PRELIMINARY RESULTS* Vascular Ultrasound Carotid Duplex (Doppler) has been completed.  Preliminary findings: Bilateral: 1-39% ICA stenosis. Right vertebral artery: atypical flow. Left vertebral artery: antegrade flow.   Brenda Mcguire FRANCES 10/25/2014, 12:43 PM

## 2014-10-25 NOTE — Progress Notes (Signed)
RN and Rapid Response nurse witnessed patients third tonic/clonic type seizure that lasted roughly 1 min 30 seconds.  1mg  of Ativan was immediately pulled and given to patient. Pt was turned to side and vitals obtained.  Will continue to monitor.

## 2014-10-26 ENCOUNTER — Telehealth: Payer: Self-pay | Admitting: Internal Medicine

## 2014-10-26 LAB — GLUCOSE, CAPILLARY: GLUCOSE-CAPILLARY: 207 mg/dL — AB (ref 70–99)

## 2014-10-26 MED ORDER — HEPARIN SOD (PORK) LOCK FLUSH 100 UNIT/ML IV SOLN
500.0000 [IU] | INTRAVENOUS | Status: AC | PRN
  Administered 2014-10-26: 500 [IU]

## 2014-10-26 MED ORDER — ATORVASTATIN CALCIUM 10 MG PO TABS: 10.0000 mg | ORAL_TABLET | Freq: Every day | ORAL | Status: DC

## 2014-10-26 NOTE — Progress Notes (Addendum)
Patient discharged home with spouse at bedside. Discharged instructions given to patient and spouse. Patient stated "My topamax is not included in this list of medications- I told the guy in the ED about my topamax and it is still not included, this is why I had 7 seizures because I missed my medication since Monday and nobody is listening to me. So i just want to go home". Charge nurse was notified about the situation. Charge nurse also informed patient and spouse about medication not being on her home medication list. Patient was upset about the problem not being resolved. Patient stated "I just want to go home". RN and NT transferred patient via wheelchair to the car. Marin RobertsAisha Kimisha Eunice RN

## 2014-10-26 NOTE — Telephone Encounter (Signed)
She can make a follow up to discuss

## 2014-10-26 NOTE — Plan of Care (Signed)
Problem: Consults Goal: General Medical Patient Education See Patient Education Module for specific education.  Outcome: Completed/Met Date Met:  10/26/14

## 2014-10-26 NOTE — Progress Notes (Signed)
Subjective: Patient  Remains tearful and upset.  Wants to be started on chemotherapy.  Is on her last day of steroids today.  Has tolerated well but has not noted improvement.  Reports that she wants a note to return to work on Monday.  She feels that at work she just gets in a zone and she feels she will be able to handle it.    Objective: Current vital signs: BP 135/75 mmHg  Pulse 71  Temp(Src) 98.8 F (37.1 C) (Oral)  Resp 18  Ht 5\' 10"  (1.778 m)  Wt 84.5 kg (186 lb 4.6 oz)  BMI 26.73 kg/m2  SpO2 92% Vital signs in last 24 hours: Temp:  [97.4 F (36.3 C)-99.1 F (37.3 C)] 98.8 F (37.1 C) (11/13 1426) Pulse Rate:  [51-105] 71 (11/13 1426) Resp:  [16-20] 18 (11/13 1426) BP: (112-169)/(57-105) 135/75 mmHg (11/13 1426) SpO2:  [92 %-100 %] 92 % (11/13 1426)  Intake/Output from previous day:   Intake/Output this shift: Total I/O In: 80 [P.O.:80] Out: -  Nutritional status: Diet Carb Modified Diet - low sodium heart healthy  Neurologic Exam: Mental Status: Alert, oriented, thought content appropriate. Speech fluent without evidence of aphasia. Able to follow 3 step commands without difficulty. Cranial Nerves: II: Due to photosensitivity patient will not allow me to view her eyes. Right eye 6mm and unreactive. Left eye 4mm and reactive.  III,IV, VI: ptosis not present, extra-ocular motions intact bilaterally V,VII: smile symmetric, facial light touch sensation normal bilaterally VIII: hearing normal bilaterally IX,X: gag reflex present XI: bilateral shoulder shrug XII: midline tongue extension Motor: 5/5 throughout  Deep Tendon Reflexes: 2+ and symmetric throughout Plantars: Right: downgoingLeft: downgoing Cerebellar: normal finger-to-nose and normal heel-to-shin test   Lab Results: Basic Metabolic Panel:  Recent Labs Lab 10/23/14 1741 10/24/14 0715  NA 139 138  K 4.3 4.2  CL 104 103  CO2 19 19  GLUCOSE 91 176*  BUN  10 15  CREATININE 0.64 0.63  CALCIUM 9.4 9.2    Liver Function Tests:  Recent Labs Lab 10/24/14 0715  AST 40*  ALT 55*  ALKPHOS 84  BILITOT 0.2*  PROT 6.8  ALBUMIN 3.5   No results for input(s): LIPASE, AMYLASE in the last 168 hours. No results for input(s): AMMONIA in the last 168 hours.  CBC:  Recent Labs Lab 10/23/14 1741 10/24/14 0715  WBC 7.7 6.0  NEUTROABS 5.2  --   HGB 12.0 11.7*  HCT 35.0* 34.2*  MCV 93.8 93.4  PLT 268 306    Cardiac Enzymes: No results for input(s): CKTOTAL, CKMB, CKMBINDEX, TROPONINI in the last 168 hours.  Lipid Panel: No results for input(s): CHOL, TRIG, HDL, CHOLHDL, VLDL, LDLCALC in the last 168 hours.  CBG:  Recent Labs Lab 10/24/14 0758 10/26/14 0814  GLUCAP 155* 207*    Microbiology: Results for orders placed or performed during the hospital encounter of 08/31/14  MRSA PCR Screening     Status: None   Collection Time: 08/31/14  8:15 PM  Result Value Ref Range Status   MRSA by PCR NEGATIVE NEGATIVE Final    Comment:        The GeneXpert MRSA Assay (FDA approved for NASAL specimens only), is one component of a comprehensive MRSA colonization surveillance program. It is not intended to diagnose MRSA infection nor to guide or monitor treatment for MRSA infections.    Coagulation Studies: No results for input(s): LABPROT, INR in the last 72 hours.  Imaging: No results found.  Medications:  I have reviewed the patient's current medications. Scheduled: . alteplase  2 mg Intracatheter Once  . aspirin EC  325 mg Oral Daily  . Cyclophosphamide  75 mg Oral QHS  . docusate sodium  100 mg Oral BID  . folic acid  1 mg Oral Daily  . gabapentin  900 mg Oral TID  . heparin  5,000 Units Subcutaneous 3 times per day  . levETIRAcetam  1,500 mg Intravenous Q12H  . methylPREDNISolone (SOLU-MEDROL) injection  500 mg Intravenous Q12H  . multivitamin with minerals  1 tablet Oral Daily  . sulfamethoxazole-trimethoprim  1  tablet Oral QODAY  . thiamine  100 mg Oral Daily  . valproate sodium  500 mg Intravenous 3 times per day    Assessment/Plan: Patient will complete course of steroids today.  No further inpatient treatment indicated at this time.  Patient made aware that she amy receive additional benefit from the steroids even though she is no longer receiving the infusions.    Recommendations: 1.  No steroid taper required at discharge 2.  Follow up with ophthalmology and rheumatology at discharge.   3.  Patient to continue home anticonvulsant therapy.      LOS: 3 days   Thana FarrLeslie Kelsha Older, MD [facility] [phone] 10/26/2014  2:41 PM

## 2014-10-26 NOTE — Progress Notes (Signed)
CM CONSULT  Talked to patient about DCP/ HHC choices; patient is requesting to got to a nursing facility for rehab and is [place] [facility] [place], Soc Worker made aware; patient's PCP is Dr Nicki Reaperegina Baity and the patient is happy with her care and does not want to change PCP, stated " I am going to change my Rheumatologist but not my PCP; Abelino DerrickB Lelah Rennaker RN,BSN,MHA [phone]

## 2014-10-26 NOTE — Evaluation (Addendum)
Occupational Therapy Evaluation Patient Details Name: Brenda MannanWanda Mcguire MRN: [id number] DOB: 03/13/1961 Today's Date: 10/26/2014    History of Present Illness 53 yo female admitted with transient R eye blindness. CT (-) MRI (-) PMH: Bechets syndrome, HA, sz, back surg   Past Medical History  Diagnosis Date  . GERD (gastroesophageal reflux disease)   . Hyperlipidemia   . History of blood transfusion   . Chronic headaches   . Phlebitis   . History of colonic polyps   . 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 Tonic clonic seizures   . Anxiety     "situational stress"  . Kidney stones [id number],2001   Past Surgical History  Procedure Laterality Date  . Cholecystectomy    . Tonsillectomy  1970  . Appendectomy  1975  . Lumbar fusion  1999    L5-S1  . Abdominal hysterectomy  1988  . Portacath placement Left 2008  . Stone extraction with basket    . Back surgery      L5-S1 ALIF  . Adenoidectomy  at 53 years old  . Oophorectomy  1987  . Radiology with anesthesia N/A 10/24/2014    Procedure: RADIOLOGY WITH ANESTHESIA;  Surgeon: Medication Radiologist, MD;  Location: [facility] OR;  Service: Radiology;  Laterality: N/A;      Clinical Impression   PT admitted with R eye blindness with negative MRI and CT workup. Pt currently with functional limitiations due to the deficits listed below (see OT problem list). PTA patient was working and independent with all adls. Pt declining all offered resources of therapy HHOT and outpatient OT. 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 Pt plans to have coworker pick her up and drive her to work.  Pt will benefit from skilled OT to increase their independence and safety with adls and balance to allow discharge HHOT but will be denied if patient returns to work immediately due to no longer home bound. Pt declines outpatient stating that she can not get a ride to therapy.  Spouse and patient verbally arguing  during evaluation and pt very liable. Pt states "he is stressing me out. Why now?" Spouse verbally demanding therapist inform him of "why is she going home if she is not ready? What is wrong with her eye? Why does she not care that I have a heart problem? I was told at urgent care to go via EMS to the emergency room but I am here to pick her up instead. She just doesnt care about anything anymore." OT asked directly if the spouse felt he required immediate medical care. Spouse reports "No i have to take her home. What am I suppose to do?" Spouse declines (A) from staff to help him to the emergency department. OT advised spouse to speak to RN and doctor regarding medical workup and further description. Pt states several times "I just want to go home" OT returning to room to answer patients questions. Spouse no longer in the room. Spouse walked to the 1st floor to get food. Pt tearful and states "he says he has a heart problem and needs to get help but he is going to get food? What? Why is he getting food? He says he has a hernia and we have all this workup at [facility] and the doctor said it was nothing and he wouldn't operate on him for that little thing. I can't take care of me because of him. WHy  now?" OT offering to call [facility] but pt denies.  Pt advised of fall risk and need for assistance if d/c home alone. Pt reports inability to have assistance. Pt understands fall risk. 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 Pt insist on return to work tomorrow because they will take care of her there." MD aware of patients request to return to work and fall risk.       Follow Up Recommendations  Home health OT;Other (comment) (question need for psychology follow up- pt appears very flat affect and tearful, pt verbalizes "being stressed out" "i dont know when I was happy last" "i can't remember the last time I decided  what I needed")    Equipment Recommendations  Tub/shower seat;Other (comment) (RW) both delivered to room during OT session   Recommendations for Other Services       Precautions / Restrictions Precautions Precautions: Fall Precaution Comments: R eye patch ordered for patient for ambulation only.       Mobility Bed Mobility Overal bed mobility: Modified Independent Bed Mobility: Sit to Supine       Sit to supine: Supervision   General bed mobility comments: assist for safety due to fatigue and imbalance after ambulation and stairs  Transfers Overall transfer level: Needs assistance Equipment used: Rolling walker (2 wheeled) Transfers: Sit to/from Stand Sit to Stand: Supervision;Min guard         General transfer comment: very slow moving    Balance Overall balance assessment: Needs assistance   Sitting balance-Leahy Scale: Good Sitting balance - Comments: able to sit unsupported and cross leg to fix sock without loss of balance   Standing balance support: Bilateral upper extremity supported;During functional activity Standing balance-Leahy Scale: Poor Standing balance comment: Pt leaning on sink bil UE due to sudden pain HA. Pt was able to initially static stand no UE support min guard (A)                            ADL Overall ADL's : Needs assistance/impaired Eating/Feeding: Modified independent   Grooming: Wash/dry hands;Minimal assistance;Standing Grooming Details (indicate cue type and reason): Min (A) due to buckle at sink level and bil Ue resting on sink. Pt states "I can't " but declines to sit on 3n1             Lower Body Dressing: Supervision/safety;Sit to/from stand Lower Body Dressing Details (indicate cue type and reason): able to reach bil socks and adjust sitting EOB Toilet Transfer: Min guard;Ambulation;RW   Toileting- Clothing Manipulation and Hygiene: Supervision/safety;Sit to/from stand Toileting - Clothing Manipulation  Details (indicate cue type and reason): able to complete static standing to pull up underwear with bil hands             Vision Eye Alignment: Within Functional Limits   Ocular Range of Motion: Impaired-to be further tested in functional context Tracking/Visual Pursuits: Right eye does not track laterally;Right eye does not track medially;Decreased smoothness of horizontal tracking;Impaired - to be further tested in functional context   Convergence: Impaired (comment)     Additional Comments: pt reaching for objects with accurate location of all objects throughout session. pt with fixed R eye central vision.pt with no blink to threat. Pt with R eye occluded decr smooth pursuit of L eye. Pt reports with R eye  occluded - normal L eye vision "NO its fine" . Pt reports all vision in R eye is blurry denies diplopia during OT assessment. Pt with patch ordered during PT assessment by OT to help with ambulation. pt reports "the patch helps me walking" Pt at end of session with sudden onset instant pain HA above R eye. pt reports "Oh my vision its dark again" The RN Aisha present and providing medication. OT called back to room within 10 minutes pt tracking therapist around the room with BIL eyes no deficits. Pt with ptosis notice at beginning and end of session of R eye .    Perception     Praxis      Pertinent Vitals/Pain Pain Assessment: 0-10 Pain Score: 10-Worst pain ever Pain Location: headache sudden onset over R eye Pain Descriptors / Indicators: Headache;Shooting;Sharp Pain Intervention(s): Repositioned;RN gave pain meds during session     Hand Dominance Right   Extremity/Trunk Assessment Upper Extremity Assessment Upper Extremity Assessment: Generalized weakness RUE Deficits / Details: reports inability to hold cup. Pt observed holding cell phone in L hand and texting/ typing phone numbers with R UE without deficit. pt with poor return demo of fine motor on assessment but not in  functional use. Pt able to turn on water at sink and pincher grasp paper towel to dry hands RUE Coordination: decreased fine motor   Lower Extremity Assessment Lower Extremity Assessment: Defer to PT evaluation RLE Deficits / Details: strength hip flexion 3+/5, knee extension 4=/5, ankle DF 3+/5 LLE Deficits / Details: strength hip flexion 4-/5, knee extension 4+/5, ankle DF 4+/5   Cervical / Trunk Assessment Cervical / Trunk Assessment: Normal   Communication Communication Communication: No difficulties   Cognition Arousal/Alertness: Awake/alert Behavior During Therapy: Flat affect;Anxious Overall Cognitive Status: Impaired/Different from baseline Area of Impairment: Safety/judgement;Awareness;Problem solving;Memory     Memory: Decreased short-term memory   Safety/Judgement: Decreased awareness of deficits Awareness: Emergent Problem Solving: Slow processing General Comments: Pt reports being at [facility] for 6 days intubated last month. Chart reviewd- pt was admitted on 9/18 with seizure with intubation to protect airwary on arrival. Pt was extubated within 24 hours 9/19 and combative at times. Pt was cleared medically and d/c home 9/20 with recommendations to follow up with outpatient psychology. Pt reports "no one to help me. I have to take care of him and never me. I dont have a choice I have to go to work. " Pt also reports "I am not allowed to drive my friend drives me" Pt reports inabilty to seek help from anyone she knows but then reports she plans to go to work 10/27/14 via a coworker picking her up at home. Pt verbalized understanding that she can not receive home health if she returns to work but called therapist back to the room to ask about therapy on R UE and what can be done at home. 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 Pt very inconsistent in requests and reporting information to staff   General Comments       Exercises       Shoulder Instructions      Home Living Family/patient expects to be  discharged to:: Private residence Living Arrangements: Spouse/significant other Available Help at Discharge: Family Type of Home: House Home Access: Stairs to enter Secretary/administrator of Steps: 2 Entrance Stairs-Rails: None Home Layout: Two level;1/2 bath on main level Alternate Level Stairs-Number of Steps: 16 Alternate Level Stairs-Rails: Right;Left Bathroom Shower/Tub: Producer, television/film/video: Standard     Home Equipment: None   Additional Comments: Pt reports "i need to stay on the first floor". 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    OT Diagnosis: Generalized weakness;Cognitive deficits;Acute pain   OT Problem List:     OT Treatment/Interventions:      OT Goals(Current goals can be found in the care plan section) Acute Rehab OT Goals Patient Stated Goal: To return to work tomorrow  OT Frequency:     Barriers to D/C:            Co-evaluation              End of Session Equipment Utilized During Treatment: Rolling walker;Gait belt Nurse Communication: Mobility status;Precautions  Activity Tolerance: Patient limited by pain;Other (comment) (extremely anxious- husband present and fighting verbally) Patient left: in bed;with call bell/phone within reach;with family/visitor present;with nursing/sitter in room   Time: 1321-1425 OT Time Calculation (min): 64 min Charges:  OT General Charges $OT Visit: 1 Procedure OT Evaluation $Initial OT Evaluation Tier I: 1 Procedure OT  Treatments $Self Care/Home Management : 53-67 mins G-Codes:    Boone Master B November 14, 2014, 3:07 PM  Pager: [phone]

## 2014-10-26 NOTE — Plan of Care (Signed)
Problem: Consults Goal: Diabetes Guidelines if Diabetic/Glucose > 140 If diabetic or lab glucose is > 140 mg/dl - Initiate Diabetes/Hyperglycemia Guidelines & Document Interventions  Outcome: Not Applicable Date Met:  28/24/17

## 2014-10-26 NOTE — Plan of Care (Signed)
Problem: Consults Goal: Skin Care Protocol Initiated - if Braden Score 18 or less If consults are not indicated, leave blank or document N/A  Outcome: Not Applicable Date Met:  74/12/87

## 2014-10-26 NOTE — Progress Notes (Signed)
Pt had another episode of generalized seizure activity witnessed by Scientist, physiologicalN and Charge Nurse lasting 10 minutes.  Vitals were obtained and 2mg  Ativan given.  MD notified and awaiting further orders.  Will continue to monitor.

## 2014-10-26 NOTE — Progress Notes (Signed)
Reported to me by patients RN, Arley Phenixisha, husband called and stated, "All hell is going to breaks loose when I get there!"  Security and police on the unit.  Both officer and charge RN called Mr. Gangwer(suggested by the officer) and explained that threats could not be tolerated.  The officer stated he was apologetic.  He denied to the charge RN making any threat stating "I said I dont WANT to have all hell break loose!"

## 2014-10-26 NOTE — Progress Notes (Signed)
Talked to patient again about DCP, [facility];  Miranda with [facility] called for arrangements;   Patient also requested that I call and make an apt for a Rheumatologist  Dr Zenovia JordanAngela Hawkes [phone]( [phone]) message left an the referral line and they will call the patient with an apt date and timeAbelino Derrick; B Cheronda Erck RN,BSN,MHN [phone]

## 2014-10-26 NOTE — Discharge Instructions (Signed)
Follow with Primary MD Nicki ReaperBAITY, REGINA, NP in 2-3 days   Get CBC, CMP, 2 view Chest X ray checked  by Primary MD next visit.    Activity: As tolerated with Full fall precautions use walker/cane & assistance as needed   Disposition Home     Diet: Heart Healthy    For Heart failure patients - Check your Weight same time everyday, if you gain over 2 pounds, or you develop in leg swelling, experience more shortness of breath or chest pain, call your Primary MD immediately. Follow Cardiac Low Salt Diet and 1.8 lit/day fluid restriction.   On your next visit with your primary care physician please Get Medicines reviewed and adjusted.   Please request your Prim.MD to go over all Hospital Tests and Procedure/Radiological results at the follow up, please get all Hospital records sent to your Prim MD by signing hospital release before you go home.   If you experience worsening of your admission symptoms, develop shortness of breath, life threatening emergency, suicidal or homicidal thoughts you must seek medical attention immediately by calling 911 or calling your MD immediately  if symptoms less severe.  You Must read complete instructions/literature along with all the possible adverse reactions/side effects for all the Medicines you take and that have been prescribed to you. Take any new Medicines after you have completely understood and accpet all the possible adverse reactions/side effects.   Do not drive, operating heavy machinery, perform activities at heights, swimming or participation in water activities or provide baby sitting services if your were admitted for syncope or siezures until you have seen your Neurologist and advised to do so again.  Do not drive when taking Pain medications.    Do not take more than prescribed Pain, Sleep and Anxiety Medications  Special Instructions: If you have smoked or chewed Tobacco  in the last 2 yrs please stop smoking, stop any regular Alcohol  and  or any Recreational drug use.  Wear Seat belts while driving.   Please note  You were cared for by a hospitalist during your hospital stay. If you have any questions about your discharge medications or the care you received while you were in the hospital after you are discharged, you can call the unit and asked to speak with the hospitalist on call if the hospitalist that took care of you is not available. Once you are discharged, your primary care physician will handle any further medical issues. Please note that NO REFILLS for any discharge medications will be authorized once you are discharged, as it is imperative that you return to your primary care physician (or establish a relationship with a primary care physician if you do not have one) for your aftercare needs so that they can reassess your need for medications and monitor your lab values.                                                      Brenda Mcguire was admitted to the Hospital on 10/23/2014 and Discharged  10/26/2014 and should be excused from work/school   for 5  days starting 10/23/2014 , may return to work after this timeframe as appropriate. 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  Call Brenda RaringPrashant Santino Kinsella MD,  [facility] [phone] with questions.  Brenda Mcguire,Brenda Mcguire K M.D on 10/26/2014,at 10:17 AM  [facility]   Office  [phone]

## 2014-10-26 NOTE — Discharge Summary (Addendum)
Brenda Mcguire, is a 53 y.o. female  DOB Feb 15, 1961  MRN [id number].  Admission date:  10/23/2014  Admitting Physician  Yevonne Pax, MD  Discharge Date:  10/26/2014   Primary MD  Nicki Reaper, NP  Recommendations for primary care physician for things to follow:   Needs close outpatient rheumatology and ophthalmology follow-up.  Monitor secondary to his factors for stroke   Admission Diagnosis  Vision loss [H54.7] Unequal pupils [H57.02]   Discharge Diagnosis  Vision loss [H54.7] Unequal pupils [H57.02]    Principal Problem:   Vision loss of right eye Active Problems:   Seizures   Blurred vision   Acute loss of vision      Past Medical History  Diagnosis Date  . GERD (gastroesophageal reflux disease)   . Hyperlipidemia   . History of blood transfusion   . Chronic headaches   . Phlebitis   . History of colonic polyps   . 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 Tonic clonic seizures   . Anxiety     "situational stress"  . Kidney stones 8295,6213    Past Surgical History  Procedure Laterality Date  . Cholecystectomy    . Tonsillectomy  1970  . Appendectomy  1975  . Lumbar fusion  1999    L5-S1  . Abdominal hysterectomy  1988  . Portacath placement Left 2008  . Stone extraction with basket    . Back surgery      L5-S1 ALIF  . Adenoidectomy  at 53 years old  . Oophorectomy  1987  . Radiology with anesthesia N/A 10/24/2014    Procedure: RADIOLOGY WITH ANESTHESIA;  Surgeon: Medication Radiologist, MD;  Location: [facility] OR;  Service: Radiology;  Laterality: N/A;       History of present illness and  Hospital Course:     Kindly see H&P for history of present illness and admission details, please review complete Labs, Consult reports and Test reports for all details in brief  HPI  from the history and  physical done on the day of admission  Brenda Mcguire is a 53 y.o. female with prior history of Bechets Syndrome presents with sudden loss of vision in the right eye. Patient states that she was her usual self yesterday when she experienced a loss of vision in the right eye. Patient states that it only lasted a few seconds so did not think much more about it.Today however she noted that she had increased bluriness in her right eye. 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 She states that she also noted that her right pupil looked blown. She apparently has a prior history of similar symptoms and was treated with steroids. In the ED Neurology has been asked to see her and they have suggested further diagnostic testing as well as admit for high dose steroids.    Hospital Course    1. 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 Per rheumatology continue oral Cytoxan along with IV Solu-Medrol which she received here under the guidance of neurology, He is chronically on prednisone at home which will be continued at home dose along with home dose Cytoxan. Non Acute MRI ruled out stroke, stable echogram and carotid duplex.. Seen by ophthalmology Dr. Randon GoldsmithLyles who wants outpatient follow-up.    Discussed with Dr. Randon GoldsmithLyles right pupillary dilation and fixed right pupil is most consistent with pharmacological pupillary dilation, she denies access to eyedrops. She will require close outpatient follow-up with ophthalmology and rheumatology.    2. Bechet's - discussed treatment with Dr. Dierdre ForthBeekman, currently continue oral Cytoxan and IV Solu-Medrol at present dose. Follow with primary rheumatologist outpatient Dr Kellie Simmeringruslow, patient not satisfied with the rheumatologist and wants to switch dermatologist,  case manager will help her find a local rheumatologist. Diagnosis sebaceous disease is questionable. Patient does not give exact details how this was diagnosed. We'll request rheumatologist to look into diagnosis of Behcet's disease in detail.    3.Seizures - On Keppra and Depakote continue. 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    4.Neuropathy - continue Neurontin.    5.Dyslipidemia. Placed on statin low-dose.    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   Soon after discharge patient claims that she feels very weak and will now like to be placed to a nursing home, home PT was already arranged and PT is to see her here, she has no focal deficits, she continues to provide bizarre history and very inconsistent exam, I think she might benefit from a outpatient psych evaluation as well. She is currently not suicidal or homicidal and denies any depression.      Discharge Condition: stable   Follow UP  Follow-up Information    Follow up with Nicki ReaperBAITY, REGINA, NP. Schedule an appointment as soon as possible for a visit in 3 days.   Specialty:  Internal Medicine   Why:  and your rheumatologist   Contact information:   [address] [place] [address] [phone]       Follow up with Antony ContrasLYLES, GRAHAM, MD. Schedule an appointment as soon as possible for a visit in 1 week.   Specialty:  Ophthalmology   Contact information:   [address] [place] [address] [phone]       Follow up with [facility]. Schedule an appointment as soon as possible for a visit in 2 weeks.   Contact information:   [address]     [address] [place] [phone]      Follow up with Donnal MoatRUSLOW,WILLIAM WORTH, MD. Schedule an appointment as soon as possible for a visit in  3 days.   Specialty:  Rheumatology   Contact information:   [place] [place] [address] [phone]         Discharge Instructions  and  Discharge Medications          Discharge Instructions    Diet - low sodium heart healthy    Complete by:  As directed      Discharge instructions    Complete by:  As directed   Follow with Primary MD Nicki ReaperBAITY, REGINA, NP  in 2-3 days   Get CBC, CMP, 2 view Chest X ray checked  by Primary MD next visit.    Activity: As tolerated with Full fall precautions use walker/cane & assistance as needed   Disposition Home     Diet: Heart Healthy    For Heart failure patients - Check your Weight same time everyday, if you gain over 2 pounds, or you develop in leg swelling, experience more shortness of breath or chest pain, call your Primary MD immediately. Follow Cardiac Low Salt Diet and 1.8 lit/day fluid restriction.   On your next visit with your primary care physician please Get Medicines reviewed and adjusted.   Please request your Prim.MD to go over all Hospital Tests and Procedure/Radiological results at the follow up, please get all Hospital records sent to your Prim MD by signing hospital release before you go home.   If you experience worsening of your admission symptoms, develop shortness of breath, life threatening emergency, suicidal or homicidal thoughts you must seek medical attention immediately by calling 911 or calling your MD immediately  if symptoms less severe.  You Must read complete instructions/literature along with all the possible adverse reactions/side effects for all the Medicines you take and that have been prescribed to you. Take any new Medicines after you have completely understood and accpet all the possible adverse reactions/side effects.   Do not drive, operating heavy machinery, perform activities at heights, swimming or participation in water activities or provide baby sitting services if your were  admitted for syncope or siezures until you have seen your Neurologist and advised to do so again.  Do not drive when taking Pain medications.    Do not take more than prescribed Pain, Sleep and Anxiety Medications  Special Instructions: If you have smoked or chewed Tobacco  in the last 2 yrs please stop smoking, stop any regular Alcohol  and or any Recreational drug use.  Wear Seat belts while driving.   Please note  You were cared for by a hospitalist during your hospital stay. If you have any questions about your discharge medications or the care you received while you were in the hospital after you are discharged, you can call the unit and asked to speak with the hospitalist on call if the hospitalist that took care of you is not available. Once you are discharged, your primary care physician will handle any further medical issues. Please note that NO REFILLS for any discharge medications will be authorized once you are discharged, as it is imperative that you return to your primary care physician (or establish a relationship with a primary care physician if you do not have one) for your aftercare needs so that they can reassess your need for medications and monitor your lab values.                                                      Rupinder Livingston was admitted to the Hospital on 10/23/2014 and Discharged  10/26/2014 and should be excused from work/school   for 5  days starting 10/23/2014 , may return to work after this timeframe as appropriate. 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  Call Susa Raring MD, [facility] [phone] with questions.  Susa Raring  K M.D on 10/26/2014,at 10:17 AM  [facility]   Office  [phone]     Increase activity slowly    Complete by:  As directed             Medication List    TAKE these medications        ALPRAZolam 0.5 MG tablet    Commonly known as:  XANAX  Take 1 tablet (0.5 mg total) by mouth 3 (three) times daily as needed for anxiety.     amitriptyline 25 MG tablet  Commonly known as:  ELAVIL  TAKE 1 TABLET (25 MG TOTAL) BY MOUTH AT BEDTIME.     atorvastatin 10 MG tablet  Commonly known as:  LIPITOR  Take 1 tablet (10 mg total) by mouth daily.     BACTRIM DS 800-160 MG per tablet  Generic drug:  sulfamethoxazole-trimethoprim  Take 1 tablet by mouth every other day. Take every day because she is on Chemo Per patient     cyclophosphamide 25 MG tablet  Commonly known as:  CYTOXAN  Take 75 mg by mouth daily. Give on an empty stomach 1 hour before or 2 hours after meals.     diazepam 5 MG tablet  Commonly known as:  VALIUM  Take 5 mg by mouth every 6 (six) hours as needed for anxiety.     divalproex 250 MG DR tablet  Commonly known as:  DEPAKOTE  Take 2 tablets (500 mg total) by mouth 3 (three) times daily.     gabapentin 300 MG capsule  Commonly known as:  NEURONTIN  Take 3 capsules (900 mg total) by mouth 3 (three) times daily.     HYDROcodone-acetaminophen 5-325 MG per tablet  Commonly known as:  NORCO/VICODIN  Take 1 tablet by mouth 3 (three) times daily as needed for moderate pain.     HYDROcodone-acetaminophen 5-325 MG per tablet  Commonly known as:  NORCO/VICODIN  Take 1 tablet by mouth 3 (three) times daily as needed for moderate pain.     levETIRAcetam 500 MG tablet  Commonly known as:  KEPPRA  Take 3 tablets (1,500 mg total) by mouth 2 (two) times daily.     predniSONE 10 MG tablet  Commonly known as:  DELTASONE  Take 10 mg by mouth daily with breakfast.     promethazine 50 MG tablet  Commonly known as:  PHENERGAN  Take 0.5 tablets (25 mg total) by mouth every 6 (six) hours as needed for nausea or vomiting.     sertraline 50 MG tablet  Commonly known as:  ZOLOFT  Take 1 tablet (50 mg total) by mouth daily.     zolpidem 10 MG tablet  Commonly known as:  AMBIEN  Take 1 tablet  (10 mg total) by mouth at bedtime as needed for sleep.          Diet and Activity recommendation: See Discharge Instructions above   Consults obtained - Neuro, opthalmology   Major procedures and Radiology Reports - PLEASE review detailed and final reports for all details, in brief -   Echo  Left ventricle: The cavity size was normal. Systolic function was normal. The estimated ejection fraction was in the range of 55% to 60%. Wall motion was normal; there were no regional wall motion abnormalities. Left ventricular diastolic function parameters were normal.    Carotids  Carotid Duplex (Doppler) has been completed. Preliminary findings: Bilateral: 1-39% ICA stenosis. Right vertebral artery: atypical flow. Left vertebral artery: antegrade flow.  Ct Head Wo Contrast  10/23/2014   CLINICAL DATA:  Five day history of headache. Intermittent blurred vision. Unequal pupils. Patient with Behcet's disease  EXAM: CT HEAD WITHOUT CONTRAST  TECHNIQUE: Contiguous axial images were obtained from the base of the skull through the vertex without intravenous contrast.  COMPARISON:  August 31, 2014  FINDINGS: The ventricles are normal in size and configuration. There is no mass, hemorrhage, extra-axial fluid collection, or midline shift. Gray-white compartments are normal. There is no demonstrable acute infarct. Bony calvarium appears intact. The mastoid air cells are clear.  IMPRESSION: Study within normal limits. No intracranial mass, hemorrhage, or focal gray -white compartment lesions/acute appearing infarct.   Electronically Signed   By: Bretta Bang M.D.   On: 10/23/2014 15:08   Mr Maxine Glenn Head Wo Contrast  10/24/2014   CLINICAL DATA:  Personal history of a Behcet's syndrome with sudden transient loss of vision in the right eye. Vision the right eye remains blurry. P biliary dilation scratch the the right pupil is dilated. During the course in the emergency department patient developed  right-sided weakness in the upper and lower extremity.  EXAM: MRI HEAD WITHOUT CONTRAST  MRA HEAD WITHOUT CONTRAST  MRV HEAD WITHOUT CONTRAST  TECHNIQUE: Multiplanar, multiecho pulse sequences of the brain and surrounding structures were obtained without intravenous contrast. Angiographic images of the head were obtained using MRA technique without contrast. Angiographic images of the intracranial venous structures were obtained using MRV technique without intravenous contrast.  COMPARISON:  CT head without contrast 10/23/2014.  FINDINGS: MRI HEAD FINDINGS  The diffusion-weighted images demonstrate no evidence for acute or subacute infarction. No acute hemorrhage or mass lesion is evident. Scattered subcortical T2 hyperintensities in the anterior frontal lobes bilaterally are slightly greater than expected for age no confluent white matter changes are evident. Flow is present in the major intracranial arteries. The globes and orbits are intact. There is symmetric size and signal of the optic nerves bilaterally. The extraocular muscles are within normal limits.  The paranasal sinuses and mastoid air cells are clear. Flow is present in the major intracranial arteries. Midline structures are within normal limits.  MRA HEAD FINDINGS  The internal carotid arteries are within normal limits from the high cervical segments through the ICA termini bilaterally. The A1 and M1 segments are normal. The MCA bifurcations are within normal limits. ACA and MCA branch vessels are within normal limits.  The left vertebral artery is the dominant vessel. The PICA origins are visualized and normal bilaterally. The basilar artery is normal. Both posterior cerebral arteries originate from the basilar tip. The PCA branch vessels are within normal limits.  MRV HEAD FINDINGS  Major dural sinuses are patent. There is however, very limited flow within the left transverse sinus. The superior sagittal sinus an inferior sagittal sinus is patent.  The right transverse sinus is patent. The torcula herophilii is midline suggesting non dominance of the transverse sinus. The left sigmoid sinus and internal jugular vein are present, the smaller than on the right. The dural veins are patent.  IMPRESSION: 1. Poor visualization of the left transverse sinus may represent stenosis of the sinus. There is no definite thrombus. 2. Minimal scattered subcortical hyperintensities anteriorly are slightly greater than expected for age. These are not classic for a Behcet's. The finding is nonspecific but can be seen in the setting of chronic microvascular ischemia, a demyelinating process such as multiple sclerosis, vasculitis, complicated migraine headaches, or as the sequelae of a prior infectious or  inflammatory process. 3. Otherwise normal MRI of the brain. 4. Normal variant MRA circle of Willis without evidence for significant proximal stenosis, aneurysm, or branch vessel occlusion. 5. Otherwise normal MRV without evidence for focal thrombosis.   Electronically Signed   By: Gennette Pac M.D.   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 Vision the right eye remains blurry. P biliary dilation scratch the the right pupil is dilated. During the course in the emergency department patient developed right-sided weakness in the upper and lower extremity.  EXAM: MRI HEAD WITHOUT CONTRAST  MRA HEAD WITHOUT CONTRAST  MRV HEAD WITHOUT CONTRAST  TECHNIQUE: Multiplanar, multiecho pulse sequences of the brain and surrounding structures were obtained without intravenous contrast. Angiographic images of the head were obtained using MRA technique without contrast. Angiographic images of the intracranial venous structures were obtained using MRV technique without intravenous contrast.  COMPARISON:  CT head without contrast 10/23/2014.  FINDINGS: MRI HEAD  FINDINGS  The diffusion-weighted images demonstrate no evidence for acute or subacute infarction. No acute hemorrhage or mass lesion is evident. Scattered subcortical T2 hyperintensities in the anterior frontal lobes bilaterally are slightly greater than expected for age no confluent white matter changes are evident. Flow is present in the major intracranial arteries. The globes and orbits are intact. There is symmetric size and signal of the optic nerves bilaterally. The extraocular muscles are within normal limits.  The paranasal sinuses and mastoid air cells are clear. Flow is present in the major intracranial arteries. Midline structures are within normal limits.  MRA HEAD FINDINGS  The internal carotid arteries are within normal limits from the high cervical segments through the ICA termini bilaterally. The A1 and M1 segments are normal. The MCA bifurcations are within normal limits. ACA and MCA branch vessels are within normal limits.  The left vertebral artery is the dominant vessel. The PICA origins are visualized and normal bilaterally. The basilar artery is normal. Both posterior cerebral arteries originate from the basilar tip. The PCA branch vessels are within normal limits.  MRV HEAD FINDINGS  Major dural sinuses are patent. There is however, very limited flow within the left transverse sinus. The superior sagittal sinus an inferior sagittal sinus is patent. The right transverse sinus is patent. The torcula herophilii is midline suggesting non dominance of the transverse sinus. The left sigmoid sinus and internal jugular vein are present, the smaller than on the right. The dural veins are patent.  IMPRESSION: 1. Poor visualization of the left transverse sinus may represent stenosis of the sinus. There is no definite thrombus. 2. Minimal scattered subcortical hyperintensities anteriorly are slightly greater than expected for age. These are not classic for a Behcet's. The finding is nonspecific but can be  seen in the setting of chronic microvascular ischemia, a demyelinating process such as multiple sclerosis, vasculitis, complicated migraine headaches, or as the sequelae of a prior infectious or inflammatory process. 3. Otherwise normal MRI of the brain. 4. Normal variant MRA circle of Willis without evidence for significant proximal stenosis, aneurysm, or branch vessel occlusion. 5. Otherwise normal MRV without evidence for focal thrombosis.   Electronically Signed   By: Gennette Pac M.D.   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  Vision the right eye remains blurry. P biliary dilation scratch the the right pupil is dilated. During the course in the emergency department patient developed right-sided weakness in the upper and lower extremity.  EXAM: MRI HEAD WITHOUT CONTRAST  MRA HEAD WITHOUT CONTRAST  MRV HEAD WITHOUT CONTRAST  TECHNIQUE: Multiplanar, multiecho pulse sequences of the brain and surrounding structures were obtained without intravenous contrast. Angiographic images of the head were obtained using MRA technique without contrast. Angiographic images of the intracranial venous structures were obtained using MRV technique without intravenous contrast.  COMPARISON:  CT head without contrast 10/23/2014.  FINDINGS: MRI HEAD FINDINGS  The diffusion-weighted images demonstrate no evidence for acute or subacute infarction. No acute hemorrhage or mass lesion is evident. Scattered subcortical T2 hyperintensities in the anterior frontal lobes bilaterally are slightly greater than expected for age no confluent white matter changes are evident. Flow is present in the major intracranial arteries. The globes and orbits are intact. There is symmetric size and signal of the optic nerves bilaterally. The extraocular muscles are within normal limits.  The paranasal sinuses and mastoid air cells  are clear. Flow is present in the major intracranial arteries. Midline structures are within normal limits.  MRA HEAD FINDINGS  The internal carotid arteries are within normal limits from the high cervical segments through the ICA termini bilaterally. The A1 and M1 segments are normal. The MCA bifurcations are within normal limits. ACA and MCA branch vessels are within normal limits.  The left vertebral artery is the dominant vessel. The PICA origins are visualized and normal bilaterally. The basilar artery is normal. Both posterior cerebral arteries originate from the basilar tip. The PCA branch vessels are within normal limits.  MRV HEAD FINDINGS  Major dural sinuses are patent. There is however, very limited flow within the left transverse sinus. The superior sagittal sinus an inferior sagittal sinus is patent. The right transverse sinus is patent. The torcula herophilii is midline suggesting non dominance of the transverse sinus. The left sigmoid sinus and internal jugular vein are present, the smaller than on the right. The dural veins are patent.  IMPRESSION: 1. Poor visualization of the left transverse sinus may represent stenosis of the sinus. There is no definite thrombus. 2. Minimal scattered subcortical hyperintensities anteriorly are slightly greater than expected for age. These are not classic for a Behcet's. The finding is nonspecific but can be seen in the setting of chronic microvascular ischemia, a demyelinating process such as multiple sclerosis, vasculitis, complicated migraine headaches, or as the sequelae of a prior infectious or inflammatory process. 3. Otherwise normal MRI of the brain. 4. Normal variant MRA circle of Willis without evidence for significant proximal stenosis, aneurysm, or branch vessel occlusion. 5. Otherwise normal MRV without evidence for focal thrombosis.   Electronically Signed   By: Gennette Pac M.D.   On: 10/24/2014 13:15    Micro Results      No results found  for this or any previous visit (from the past 240 hour(s)).     Today   Subjective:   Kerra Guilfoil today has no headache,no chest abdominal pain,no new weakness tingling or numbness, feels much better wants to go home today.   Objective:   Blood pressure 148/82, pulse 71, temperature 98.6 F (37 C), temperature source Axillary, resp. rate 18, height 5\' 10"  (1.778 m), weight 84.5 kg (186 lb 4.6 oz), SpO2 96 %.   Intake/Output Summary (Last 24 hours) at 10/26/14 1026 Last data filed at 10/26/14 [phone]  Gross per 24 hour  Intake     80 ml  Output      0 ml  Net     80 ml    Exam Awake Alert, Oriented x 3, No new F.N deficits, Normal affect [place].AT, right pupil is slightly dilated and sluggish to react Supple Neck,No JVD, No cervical lymphadenopathy appriciated.  Symmetrical Chest wall movement, Good air movement bilaterally, CTAB RRR,No Gallops,Rubs or new Murmurs, No Parasternal Heave +ve B.Sounds, Abd Soft, Non tender, No organomegaly appriciated, No rebound -guarding or rigidity. No Cyanosis, Clubbing or edema, No new Rash or bruise  Data Review   CBC w Diff:  Lab Results  Component Value Date   WBC 6.0 10/24/2014   HGB 11.7* 10/24/2014   HCT 34.2* 10/24/2014   PLT 306 10/24/2014   LYMPHOPCT 24 10/23/2014   MONOPCT 5 10/23/2014   EOSPCT 3 10/23/2014   BASOPCT 0 10/23/2014    CMP:  Lab Results  Component Value Date   NA 138 10/24/2014   K 4.2 10/24/2014   CL 103 10/24/2014   CO2 19 10/24/2014   BUN 15 10/24/2014   CREATININE 0.63 10/24/2014   PROT 6.8 10/24/2014   ALBUMIN 3.5 10/24/2014   BILITOT 0.2* 10/24/2014   ALKPHOS 84 10/24/2014   AST 40* 10/24/2014   ALT 55* 10/24/2014  . Lab Results  Component Value Date   HGBA1C 5.8* 10/23/2014    Lab Results  Component Value Date   CHOL 234* 03/02/2013   HDL 45.90 03/02/2013   LDLDIRECT 186.4 03/02/2013   TRIG 130 08/31/2014   CHOLHDL 5 03/02/2013     Total Time in preparing paper work, data  evaluation and todays exam - 35 minutes  Leroy Sea M.D on 10/26/2014 at 10:26 AM  [facility] Office  [phone]

## 2014-10-26 NOTE — Evaluation (Signed)
Physical Therapy Evaluation Patient Details Name: Brenda MannanWanda Mcguire MRN: [id number] DOB: September 20, 1961 Today's Date: 10/26/2014   History of Present Illness  Patient is a 53 y.o. female admitted with transient right eye blindness.  Has history of Bechet's syndrome, headaches, seizures, and back surgery.  Clinical Impression  Patient presents with decreased independence with mobility due to deficits listed in PT problem list.  She will benefit from skilled PT in the acute setting to address these issues and maximize independence for safe d/c home with spouse assist and HHPT.  Educated patient in safer techniques with walker and on stairs (but mainly to wait to attempt stairs for HHPT.)  She insists she must return to work on Monday though I warned her HHPT would d/c her if she went to work.  She was very fatigued after walking and negotiating the stairs today.  Right LE weakness and imbalance associated with fatigue and dizziness mainly contributing.  Concern for organic cause of symptoms more the weakness and lack of coordination than just vision issues.  Feel patient needs outpatient neuro follow up in addition to the planned rheumatology follow up.   Follow Up Recommendations Home health PT;Supervision for mobility/OOB    Equipment Recommendations  Rolling walker with 5" wheels    Recommendations for Other Services       Precautions / Restrictions Precautions Precautions: Fall Precaution Comments: patched right eye due to patient report of double vision and dizziness when trying to mobilize with binocular vision      Mobility  Bed Mobility Overal bed mobility: Needs Assistance Bed Mobility: Sit to Supine       Sit to supine: Supervision   General bed mobility comments: assist for safety due to fatigue and imbalance after ambulation and stairs  Transfers Overall transfer level: Needs assistance   Transfers: Sit to/from Stand Sit to Stand: Supervision;Min guard         General  transfer comment: very slow to rise without UE support reaching for IV pole and arm on chair; slight improvement after eye patch  Ambulation/Gait Ambulation/Gait assistance: Min assist;Mod assist Ambulation Distance (Feet): 300 Feet Assistive device: Rolling walker (2 wheeled) Gait Pattern/deviations: Step-through pattern;Decreased dorsiflexion - right;Wide base of support;Staggering right     General Gait Details: decreased foot clearance with right dragging it at times, but able to plant heel first with cues, with fatigue increased support for safety as pt putting lot of weight on walker and leaning with risk for anterior loss of balance, also at times stepping past walker with cues to keep walker ahead of feet to prevent posterior loss of balance  Stairs Stairs: Yes     Number of Stairs: 12 General stair comments: mod frequent cues for sequence for stepping up with left foot first and down with right first; two assist due to still with IV lines and needed at times mod assist for safety due to loss of balance with fatigue and c/o dizziness on stairs; patient cautioned not to attempt stairs until HHPT can educate her spouse how to assist her safely.  Wheelchair Mobility    Modified Rankin (Stroke Patients Only)       Balance Overall balance assessment: Needs assistance   Sitting balance-Leahy Scale: Good Sitting balance - Comments: able to sit unsupported and cross leg to fix sock without loss of balance     Standing balance-Leahy Scale: Poor Standing balance comment: reliant on UE assist for balance and seemed more imbalanced with c/o dizziness with double vision prior to  patching right eye                             Pertinent Vitals/Pain Pain Assessment: No/denies pain (no pain currently, has headaches when eye bothers her.)    Home Living Family/patient expects to be discharged to:: Private residence Living Arrangements: Spouse/significant other Available  Help at Discharge: Family Type of Home: House Home Access: Stairs to enter Entrance Stairs-Rails: None Secretary/administratorntrance Stairs-Number of Steps: 2 Home Layout: Two level Home Equipment: None      Prior Function Level of Independence: Independent         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      Hand Dominance   Dominant Hand: Right    Extremity/Trunk Assessment   Upper Extremity Assessment: RUE deficits/detail RUE Deficits / Details: decreased grip compared to left, slowed coordination with finger opposition and dysmetria with finger to nose to finger; othewise defer to OT         Lower Extremity Assessment: Generalized weakness;RLE deficits/detail;LLE deficits/detail RLE Deficits / Details: strength hip flexion 3+/5, knee extension 4=/5, ankle DF 3+/5 LLE Deficits / Details: strength hip flexion 4-/5, knee extension 4+/5, ankle DF 4+/5     Communication   Communication: No difficulties  Cognition Arousal/Alertness: Awake/alert Behavior During Therapy: Anxious (tearful at times) Overall Cognitive Status: No family/caregiver present to determine baseline cognitive functioning (poor judgement; plas to return to work Monday, but unable to walk with walker and assistance safely)                      General Comments      Exercises        Assessment/Plan    PT Assessment Patient needs continued PT services  PT Diagnosis Abnormality of gait;Generalized weakness   PT Problem List Decreased strength;Decreased activity tolerance;Decreased balance;Decreased coordination;Decreased mobility;Decreased safety awareness;Impaired sensation  PT Treatment Interventions DME instruction;Gait training;Therapeutic exercise;Stair training;Balance training;Functional mobility training;Therapeutic activities;Patient/family education   PT Goals (Current goals can be found in the Care Plan section) Acute Rehab PT Goals Patient Stated Goal: To return to work monday PT Goal  Formulation: With patient Time For Goal Achievement: 11/02/14 Potential to Achieve Goals: Fair    Frequency Min 3X/week   Barriers to discharge        Co-evaluation               End of Session Equipment Utilized During Treatment: Gait belt;Other (comment) (gauze patch taped to right eye) Activity Tolerance: Patient limited by fatigue Patient left: in bed;with call bell/phone within reach Nurse Communication: Mobility status         Time: [id number] PT Time Calculation (min) (ACUTE ONLY): 58 min   Charges:   PT Evaluation $Initial PT Evaluation Tier I: 1 Procedure PT Treatments $Gait Training: 23-37 mins $Self Care/Home Management: 8-22   PT G Codes:          Rael Tilly,CYNDI 10/26/2014, 12:41 PM Sheran Lawlessyndi Mateen Franssen, PT [phone] 10/26/2014

## 2014-10-26 NOTE — Progress Notes (Signed)
Pt had an episode of generalized seizure activity witnessed by RN lasting approximately 13 minutes.  Pt was turned to left side, oxygen applied at 2L, and vitals were obtained.   MD notified.

## 2014-10-26 NOTE — Progress Notes (Signed)
Patient ambulating with PT. RN noted patient's phone ringing in the room and RN answered the phone. The caller stated " I want to talk to my wife in room 4N27. And the RN stated she is ambulating with PT-do you want to leave a message. The caller stated " I am her husband and I want you to tell the nurse supervisor I am on my way and when I get there all hell will break loose" and RN stated I will let her know. Charge nurse was informed and security were called to the unit for safety precaution. Marin RobertsAisha Arieal Cuoco RN

## 2014-10-26 NOTE — Telephone Encounter (Signed)
[facility]. Pt very upset and crying because she says she is afraid she will loose her eyesight. Says "they don't know what they are doing here." Pt wants a referral to another Rheumatalogist. Says Dr Lacretia NicksW. Kellie Simmeringruslow does not know what he is doing. Pt wants referral to Dr Cheryll CockayneAngela Hawkins. Pt was informed of previous notes that she would not be given a referral. Says she does not know why.

## 2014-10-26 NOTE — Progress Notes (Signed)
Talked to patient again about DCP again,patient works in a long term care facility and stated that her co workers can take care of her at work and provide the therapy that she needs; CM informed the patient that at work, her co workers need to do their jobs and not take care of her at work; patient stated that her co workers do not do patient care - CM informed her that her co workers have a job to perform whether its paper work or what have you but in the work environment they cannot provide the therapy that she needs. CM offered to arrange Outpatient therapy - patient stated that she does not have anyone to take her to therapy or the time; HHC cannot be arranged if the patient is returning to work and not homebound. Miranda with [facility] talked to the patient also and informed her that if she is not at home and plans to return to work she will not qualify of [facility]. Patient stated "the doctor wrote me a note for me returning to work," Physical Therapist in room stated - " you asked the doctor to do this for you." CM instructed the patient that if she decides to have HHC services to call her PCP to have it arranged.  CM questioned patient about her home environment, patient stated at home everything is about him; CM offered to involve the Soc Worker for options but the patient refused. CM informed the patient that we can help her and give her resources that can help her - patient refused. Abelino DerrickB Dorrine Montone RN,BSN,MHA [phone]

## 2014-10-26 NOTE — Plan of Care (Signed)
Problem: Consults Goal: Nutrition Consult-if indicated Outcome: Not Applicable Date Met:  10/26/14     

## 2014-10-27 ENCOUNTER — Inpatient Hospital Stay (HOSPITAL_COMMUNITY)
Admission: EM | Admit: 2014-10-27 | Discharge: 2014-10-29 | DRG: 100 | Disposition: A | Discharge status: Home / Self Care | Payer: Federal, State, Local not specified - PPO | Attending: Internal Medicine | Admitting: Internal Medicine

## 2014-10-27 ENCOUNTER — Inpatient Hospital Stay (HOSPITAL_COMMUNITY): Payer: Federal, State, Local not specified - PPO

## 2014-10-27 ENCOUNTER — Emergency Department (HOSPITAL_COMMUNITY): Payer: Federal, State, Local not specified - PPO

## 2014-10-27 ENCOUNTER — Encounter (HOSPITAL_COMMUNITY): Payer: Self-pay | Admitting: *Deleted

## 2014-10-27 DIAGNOSIS — G40901 Epilepsy, unspecified, not intractable, with status epilepticus: Secondary | ICD-10-CM | POA: Diagnosis present

## 2014-10-27 DIAGNOSIS — Z888 Allergy status to other drugs, medicaments and biological substances status: Secondary | ICD-10-CM

## 2014-10-27 DIAGNOSIS — R001 Bradycardia, unspecified: Secondary | ICD-10-CM | POA: Diagnosis present

## 2014-10-27 DIAGNOSIS — E785 Hyperlipidemia, unspecified: Secondary | ICD-10-CM | POA: Diagnosis present

## 2014-10-27 DIAGNOSIS — Z887 Allergy status to serum and vaccine status: Secondary | ICD-10-CM | POA: Diagnosis not present

## 2014-10-27 DIAGNOSIS — M352 Behcet's disease: Secondary | ICD-10-CM | POA: Diagnosis present

## 2014-10-27 DIAGNOSIS — Z7952 Long term (current) use of systemic steroids: Secondary | ICD-10-CM | POA: Diagnosis not present

## 2014-10-27 DIAGNOSIS — K219 Gastro-esophageal reflux disease without esophagitis: Secondary | ICD-10-CM | POA: Diagnosis present

## 2014-10-27 DIAGNOSIS — J9600 Acute respiratory failure, unspecified whether with hypoxia or hypercapnia: Secondary | ICD-10-CM | POA: Diagnosis present

## 2014-10-27 DIAGNOSIS — J9601 Acute respiratory failure with hypoxia: Secondary | ICD-10-CM

## 2014-10-27 DIAGNOSIS — F419 Anxiety disorder, unspecified: Secondary | ICD-10-CM | POA: Diagnosis present

## 2014-10-27 DIAGNOSIS — Z800 Family history of malignant neoplasm of digestive organs: Secondary | ICD-10-CM | POA: Diagnosis not present

## 2014-10-27 DIAGNOSIS — I959 Hypotension, unspecified: Secondary | ICD-10-CM | POA: Diagnosis not present

## 2014-10-27 DIAGNOSIS — T380X5A Adverse effect of glucocorticoids and synthetic analogues, initial encounter: Secondary | ICD-10-CM | POA: Diagnosis present

## 2014-10-27 DIAGNOSIS — Z885 Allergy status to narcotic agent status: Secondary | ICD-10-CM | POA: Diagnosis not present

## 2014-10-27 DIAGNOSIS — T41295A Adverse effect of other general anesthetics, initial encounter: Secondary | ICD-10-CM | POA: Diagnosis present

## 2014-10-27 DIAGNOSIS — I214 Non-ST elevation (NSTEMI) myocardial infarction: Secondary | ICD-10-CM

## 2014-10-27 DIAGNOSIS — R569 Unspecified convulsions: Secondary | ICD-10-CM

## 2014-10-27 DIAGNOSIS — E2740 Unspecified adrenocortical insufficiency: Secondary | ICD-10-CM | POA: Diagnosis present

## 2014-10-27 DIAGNOSIS — Z833 Family history of diabetes mellitus: Secondary | ICD-10-CM | POA: Diagnosis not present

## 2014-10-27 DIAGNOSIS — Z825 Family history of asthma and other chronic lower respiratory diseases: Secondary | ICD-10-CM

## 2014-10-27 DIAGNOSIS — D72829 Elevated white blood cell count, unspecified: Secondary | ICD-10-CM | POA: Diagnosis present

## 2014-10-27 DIAGNOSIS — G40301 Generalized idiopathic epilepsy and epileptic syndromes, not intractable, with status epilepticus: Secondary | ICD-10-CM

## 2014-10-27 DIAGNOSIS — Z823 Family history of stroke: Secondary | ICD-10-CM

## 2014-10-27 DIAGNOSIS — Z8249 Family history of ischemic heart disease and other diseases of the circulatory system: Secondary | ICD-10-CM | POA: Diagnosis not present

## 2014-10-27 DIAGNOSIS — J9602 Acute respiratory failure with hypercapnia: Secondary | ICD-10-CM | POA: Diagnosis present

## 2014-10-27 DIAGNOSIS — I248 Other forms of acute ischemic heart disease: Secondary | ICD-10-CM | POA: Diagnosis present

## 2014-10-27 DIAGNOSIS — Z803 Family history of malignant neoplasm of breast: Secondary | ICD-10-CM

## 2014-10-27 DIAGNOSIS — I952 Hypotension due to drugs: Secondary | ICD-10-CM

## 2014-10-27 LAB — COMPREHENSIVE METABOLIC PANEL
ALBUMIN: 3.5 g/dL (ref 3.5–5.2)
ALK PHOS: 68 U/L (ref 39–117)
ALT: 28 U/L (ref 0–35)
AST: 14 U/L (ref 0–37)
Anion gap: 16 — ABNORMAL HIGH (ref 5–15)
BILIRUBIN TOTAL: 0.2 mg/dL — AB (ref 0.3–1.2)
BUN: 25 mg/dL — ABNORMAL HIGH (ref 6–23)
CHLORIDE: 103 meq/L (ref 96–112)
CO2: 23 meq/L (ref 19–32)
Calcium: 9.1 mg/dL (ref 8.4–10.5)
Creatinine, Ser: 0.72 mg/dL (ref 0.50–1.10)
GFR calc Af Amer: >90 mL/min (ref >90)
Glucose, Bld: 115 mg/dL — ABNORMAL HIGH (ref 70–99)
POTASSIUM: 4 meq/L (ref 3.7–5.3)
Sodium: 142 mEq/L (ref 137–147)
Total Protein: 6.7 g/dL (ref 6.0–8.3)

## 2014-10-27 LAB — MRSA PCR SCREENING: MRSA BY PCR: NEGATIVE

## 2014-10-27 LAB — I-STAT CHEM 8, ED
BUN: 23 mg/dL (ref 6–23)
CHLORIDE: 109 meq/L (ref 96–112)
CREATININE: 0.8 mg/dL (ref 0.50–1.10)
Calcium, Ion: 1.11 mmol/L — ABNORMAL LOW (ref 1.12–1.23)
GLUCOSE: 116 mg/dL — AB (ref 70–99)
HCT: 34 % — ABNORMAL LOW (ref 36.0–46.0)
HEMOGLOBIN: 11.6 g/dL — AB (ref 12.0–15.0)
POTASSIUM: 3.9 meq/L (ref 3.7–5.3)
SODIUM: 141 meq/L (ref 137–147)
TCO2: 24 mmol/L (ref 0–100)

## 2014-10-27 LAB — TROPONIN I: Troponin I: 0.68 ng/mL (ref <0.30)

## 2014-10-27 LAB — I-STAT VENOUS BLOOD GAS, ED
Acid-Base Excess: 2 mmol/L (ref 0.0–2.0)
BICARBONATE: 28.1 meq/L — AB (ref 20.0–24.0)
O2 Saturation: 72 %
PO2 VEN: 40 mmHg (ref 30.0–45.0)
TCO2: 29 mmol/L (ref 0–100)
pCO2, Ven: 47.8 mmHg (ref 45.0–50.0)
pH, Ven: 7.376 — ABNORMAL HIGH (ref 7.250–7.300)

## 2014-10-27 LAB — CBC WITH DIFFERENTIAL/PLATELET
BASOS ABS: 0 10*3/uL (ref 0.0–0.1)
BASOS PCT: 0 % (ref 0–1)
Eosinophils Absolute: 0 10*3/uL (ref 0.0–0.7)
Eosinophils Relative: 0 % (ref 0–5)
HEMATOCRIT: 34.1 % — AB (ref 36.0–46.0)
Hemoglobin: 11.5 g/dL — ABNORMAL LOW (ref 12.0–15.0)
LYMPHS PCT: 18 % (ref 12–46)
Lymphs Abs: 2.3 10*3/uL (ref 0.7–4.0)
MCH: 32.4 pg (ref 26.0–34.0)
MCHC: 33.7 g/dL (ref 30.0–36.0)
MCV: 96.1 fL (ref 78.0–100.0)
MONO ABS: 1 10*3/uL (ref 0.1–1.0)
Monocytes Relative: 8 % (ref 3–12)
NEUTROS ABS: 9.1 10*3/uL — AB (ref 1.7–7.7)
NEUTROS PCT: 74 % (ref 43–77)
Platelets: 320 10*3/uL (ref 150–400)
RBC: 3.55 MIL/uL — ABNORMAL LOW (ref 3.87–5.11)
RDW: 12.5 % (ref 11.5–15.5)
WBC: 12.4 10*3/uL — AB (ref 4.0–10.5)

## 2014-10-27 LAB — GLUCOSE, CAPILLARY
GLUCOSE-CAPILLARY: 154 mg/dL — AB (ref 70–99)
GLUCOSE-CAPILLARY: 180 mg/dL — AB (ref 70–99)
Glucose-Capillary: 166 mg/dL — ABNORMAL HIGH (ref 70–99)

## 2014-10-27 LAB — I-STAT BETA HCG BLOOD, ED (MC, WL, AP ONLY)

## 2014-10-27 LAB — RAPID URINE DRUG SCREEN, HOSP PERFORMED
Amphetamines: NOT DETECTED
BENZODIAZEPINES: POSITIVE — AB
Barbiturates: NOT DETECTED
Cocaine: NOT DETECTED
Opiates: NOT DETECTED
Tetrahydrocannabinol: NOT DETECTED

## 2014-10-27 LAB — URINALYSIS, ROUTINE W REFLEX MICROSCOPIC
Bilirubin Urine: NEGATIVE
GLUCOSE, UA: NEGATIVE mg/dL
Hgb urine dipstick: NEGATIVE
Ketones, ur: NEGATIVE mg/dL
LEUKOCYTES UA: NEGATIVE
Nitrite: NEGATIVE
PROTEIN: NEGATIVE mg/dL
SPECIFIC GRAVITY, URINE: 1.017 (ref 1.005–1.030)
Urobilinogen, UA: 1 mg/dL (ref 0.0–1.0)
pH: 7 (ref 5.0–8.0)

## 2014-10-27 LAB — I-STAT ARTERIAL BLOOD GAS, ED
Bicarbonate: 24.3 mEq/L — ABNORMAL HIGH (ref 20.0–24.0)
O2 Saturation: 100 %
PCO2 ART: 34.3 mmHg — AB (ref 35.0–45.0)
TCO2: 25 mmol/L (ref 0–100)
pH, Arterial: 7.456 — ABNORMAL HIGH (ref 7.350–7.450)
pO2, Arterial: 331 mmHg — ABNORMAL HIGH (ref 80.0–100.0)

## 2014-10-27 LAB — PROTIME-INR
INR: 1.09 (ref 0.00–1.49)
Prothrombin Time: 14.2 seconds (ref 11.6–15.2)

## 2014-10-27 LAB — PREGNANCY, URINE: Preg Test, Ur: NEGATIVE

## 2014-10-27 LAB — LIPASE, BLOOD: Lipase: 10 U/L — ABNORMAL LOW (ref 11–59)

## 2014-10-27 LAB — CK: CK TOTAL: 41 U/L (ref 7–177)

## 2014-10-27 LAB — I-STAT TROPONIN, ED: TROPONIN I, POC: 0.01 ng/mL (ref 0.00–0.08)

## 2014-10-27 LAB — I-STAT CG4 LACTIC ACID, ED: Lactic Acid, Venous: 2.27 mmol/L — ABNORMAL HIGH (ref 0.5–2.2)

## 2014-10-27 LAB — CBG MONITORING, ED: GLUCOSE-CAPILLARY: 114 mg/dL — AB (ref 70–99)

## 2014-10-27 LAB — ETHANOL

## 2014-10-27 MED ORDER — VITAL HIGH PROTEIN PO LIQD
1000.0000 mL | ORAL | Status: DC
  Administered 2014-10-27: 1000 mL
  Filled 2014-10-27 (×4): qty 1000

## 2014-10-27 MED ORDER — ASPIRIN 325 MG PO TABS
325.0000 mg | ORAL_TABLET | Freq: Every day | ORAL | Status: DC
  Administered 2014-10-27 – 2014-10-29 (×3): 325 mg via ORAL
  Filled 2014-10-27 (×3): qty 1

## 2014-10-27 MED ORDER — CETYLPYRIDINIUM CHLORIDE 0.05 % MT LIQD
7.0000 mL | Freq: Four times a day (QID) | OROMUCOSAL | Status: DC
  Administered 2014-10-27 – 2014-10-28 (×5): 7 mL via OROMUCOSAL

## 2014-10-27 MED ORDER — LIDOCAINE HCL (CARDIAC) 20 MG/ML IV SOLN
INTRAVENOUS | Status: AC
  Filled 2014-10-27: qty 5

## 2014-10-27 MED ORDER — FENTANYL CITRATE 0.05 MG/ML IJ SOLN
100.0000 ug | INTRAMUSCULAR | Status: DC | PRN
  Administered 2014-10-27: 100 ug via INTRAVENOUS
  Filled 2014-10-27: qty 2

## 2014-10-27 MED ORDER — PROPOFOL 10 MG/ML IV EMUL
5.0000 ug/kg/min | Freq: Once | INTRAVENOUS | Status: AC
  Administered 2014-10-27: 10 ug/kg/min via INTRAVENOUS
  Filled 2014-10-27: qty 100

## 2014-10-27 MED ORDER — LEVETIRACETAM IN NACL 1500 MG/100ML IV SOLN
1500.0000 mg | Freq: Two times a day (BID) | INTRAVENOUS | Status: DC
  Administered 2014-10-27 – 2014-10-28 (×2): 1500 mg via INTRAVENOUS
  Filled 2014-10-27 (×4): qty 100

## 2014-10-27 MED ORDER — GABAPENTIN 600 MG PO TABS
300.0000 mg | ORAL_TABLET | Freq: Three times a day (TID) | ORAL | Status: DC
  Administered 2014-10-27 (×2): 300 mg
  Filled 2014-10-27 (×5): qty 0.5

## 2014-10-27 MED ORDER — SUCCINYLCHOLINE CHLORIDE 20 MG/ML IJ SOLN
INTRAMUSCULAR | Status: AC
  Filled 2014-10-27: qty 1

## 2014-10-27 MED ORDER — LORAZEPAM 2 MG/ML IJ SOLN
INTRAMUSCULAR | Status: AC
  Filled 2014-10-27: qty 1

## 2014-10-27 MED ORDER — SERTRALINE HCL 50 MG PO TABS
50.0000 mg | ORAL_TABLET | Freq: Every day | ORAL | Status: DC
  Administered 2014-10-27 – 2014-10-29 (×3): 50 mg via ORAL
  Filled 2014-10-27 (×3): qty 1

## 2014-10-27 MED ORDER — SODIUM CHLORIDE 0.9 % IV SOLN: 250.0000 mL | INTRAVENOUS | Status: DC | PRN

## 2014-10-27 MED ORDER — PROPOFOL 10 MG/ML IV EMUL
INTRAVENOUS | Status: AC
  Filled 2014-10-27: qty 100

## 2014-10-27 MED ORDER — SODIUM CHLORIDE 0.9 % IV BOLUS (SEPSIS)
1000.0000 mL | Freq: Once | INTRAVENOUS | Status: AC
  Administered 2014-10-27: 1000 mL via INTRAVENOUS

## 2014-10-27 MED ORDER — SODIUM CHLORIDE 0.9 % IV SOLN
25.0000 ug/h | INTRAVENOUS | Status: DC
  Administered 2014-10-27: 100 ug/h via INTRAVENOUS
  Filled 2014-10-27 (×2): qty 50

## 2014-10-27 MED ORDER — VALPROATE SODIUM 500 MG/5ML IV SOLN
500.0000 mg | Freq: Three times a day (TID) | INTRAVENOUS | Status: DC
  Administered 2014-10-27 – 2014-10-29 (×6): 500 mg via INTRAVENOUS
  Filled 2014-10-27 (×9): qty 5

## 2014-10-27 MED ORDER — MIDAZOLAM HCL 2 MG/2ML IJ SOLN
2.0000 mg | Freq: Once | INTRAMUSCULAR | Status: AC
  Administered 2014-10-27: 2 mg via INTRAVENOUS

## 2014-10-27 MED ORDER — PANTOPRAZOLE SODIUM 40 MG IV SOLR
40.0000 mg | Freq: Every day | INTRAVENOUS | Status: DC
  Administered 2014-10-27 – 2014-10-28 (×2): 40 mg via INTRAVENOUS
  Filled 2014-10-27 (×2): qty 40

## 2014-10-27 MED ORDER — SODIUM CHLORIDE 0.9 % IV SOLN: INTRAVENOUS | Status: DC | PRN

## 2014-10-27 MED ORDER — SODIUM CHLORIDE 0.9 % IV SOLN
1000.0000 mg | Freq: Once | INTRAVENOUS | Status: AC
  Administered 2014-10-27: 1000 mg via INTRAVENOUS
  Filled 2014-10-27: qty 20

## 2014-10-27 MED ORDER — ROCURONIUM BROMIDE 50 MG/5ML IV SOLN
INTRAVENOUS | Status: AC
  Filled 2014-10-27: qty 2

## 2014-10-27 MED ORDER — LEVETIRACETAM 750 MG PO TABS
1500.0000 mg | ORAL_TABLET | Freq: Two times a day (BID) | ORAL | Status: DC
  Administered 2014-10-27: 1500 mg via ORAL
  Filled 2014-10-27 (×2): qty 2

## 2014-10-27 MED ORDER — PHENYLEPHRINE HCL 10 MG/ML IJ SOLN
30.0000 ug/min | INTRAVENOUS | Status: DC
  Administered 2014-10-28: 30 ug/min via INTRAVENOUS
  Filled 2014-10-27: qty 1

## 2014-10-27 MED ORDER — INSULIN ASPART 100 UNIT/ML ~~LOC~~ SOLN
1.0000 [IU] | SUBCUTANEOUS | Status: DC
  Administered 2014-10-27 – 2014-10-28 (×2): 2 [IU] via SUBCUTANEOUS
  Administered 2014-10-28: 1 [IU] via SUBCUTANEOUS
  Administered 2014-10-28: 2 [IU] via SUBCUTANEOUS
  Administered 2014-10-28: 1 [IU] via SUBCUTANEOUS

## 2014-10-27 MED ORDER — TETANUS-DIPHTH-ACELL PERTUSSIS 5-2.5-18.5 LF-MCG/0.5 IM SUSP
0.5000 mL | Freq: Once | INTRAMUSCULAR | Status: AC
  Administered 2014-10-27: 0.5 mL via INTRAMUSCULAR
  Filled 2014-10-27: qty 0.5

## 2014-10-27 MED ORDER — PRO-STAT SUGAR FREE PO LIQD
30.0000 mL | Freq: Three times a day (TID) | ORAL | Status: DC
  Administered 2014-10-27 – 2014-10-28 (×4): 30 mL
  Filled 2014-10-27 (×8): qty 30

## 2014-10-27 MED ORDER — HEPARIN SODIUM (PORCINE) 5000 UNIT/ML IJ SOLN
5000.0000 [IU] | Freq: Three times a day (TID) | INTRAMUSCULAR | Status: DC
  Administered 2014-10-27 – 2014-10-29 (×6): 5000 [IU] via SUBCUTANEOUS
  Filled 2014-10-27 (×10): qty 1

## 2014-10-27 MED ORDER — TOPIRAMATE 100 MG PO TABS
100.0000 mg | ORAL_TABLET | Freq: Two times a day (BID) | ORAL | Status: DC
  Administered 2014-10-27 – 2014-10-29 (×5): 100 mg via NASOGASTRIC
  Filled 2014-10-27 (×6): qty 1

## 2014-10-27 MED ORDER — ETOMIDATE 2 MG/ML IV SOLN
INTRAVENOUS | Status: AC
  Filled 2014-10-27: qty 20

## 2014-10-27 MED ORDER — ALBUTEROL SULFATE (2.5 MG/3ML) 0.083% IN NEBU: 2.5000 mg | INHALATION_SOLUTION | RESPIRATORY_TRACT | Status: DC | PRN

## 2014-10-27 MED ORDER — VALPROATE SODIUM 500 MG/5ML IV SOLN
1000.0000 mg | Freq: Once | INTRAVENOUS | Status: AC
  Administered 2014-10-27: 1000 mg via INTRAVENOUS
  Filled 2014-10-27: qty 10

## 2014-10-27 MED ORDER — DIVALPROEX SODIUM 500 MG PO DR TAB
500.0000 mg | DELAYED_RELEASE_TABLET | Freq: Three times a day (TID) | ORAL | Status: DC
  Administered 2014-10-27: 500 mg via ORAL
  Filled 2014-10-27 (×3): qty 1

## 2014-10-27 MED ORDER — SULFAMETHOXAZOLE-TRIMETHOPRIM 800-160 MG PO TABS
1.0000 | ORAL_TABLET | ORAL | Status: DC
  Administered 2014-10-27 – 2014-10-29 (×2): 1 via ORAL
  Filled 2014-10-27 (×2): qty 1

## 2014-10-27 MED ORDER — HYDROCORTISONE NA SUCCINATE PF 100 MG IJ SOLR
50.0000 mg | Freq: Four times a day (QID) | INTRAMUSCULAR | Status: DC
Start: 2014-10-27 — End: 2014-10-29
  Administered 2014-10-27 – 2014-10-29 (×9): 50 mg via INTRAVENOUS
  Filled 2014-10-27 (×12): qty 1

## 2014-10-27 MED ORDER — CHLORHEXIDINE GLUCONATE 0.12 % MT SOLN
15.0000 mL | Freq: Two times a day (BID) | OROMUCOSAL | Status: DC
  Administered 2014-10-27 – 2014-10-28 (×3): 15 mL via OROMUCOSAL
  Filled 2014-10-27 (×6): qty 15

## 2014-10-27 MED ORDER — SODIUM CHLORIDE 0.9 % IV SOLN
0.0000 mg/h | INTRAVENOUS | Status: DC
  Administered 2014-10-27: 8 mg/h via INTRAVENOUS
  Administered 2014-10-27: 3 mg/h via INTRAVENOUS
  Administered 2014-10-27: 6 mg/h via INTRAVENOUS
  Filled 2014-10-27 (×3): qty 10

## 2014-10-27 MED ORDER — LORAZEPAM 2 MG/ML IJ SOLN
1.0000 mg | Freq: Once | INTRAMUSCULAR | Status: AC
  Administered 2014-10-27: 1 mg via INTRAVENOUS

## 2014-10-27 MED ORDER — PREDNISONE 20 MG PO TABS: 40.0000 mg | ORAL_TABLET | Freq: Every day | ORAL | Status: DC

## 2014-10-27 NOTE — ED Notes (Signed)
Preparing to intubate.  The pt had a c-t of the head   Within 15 minutes of her arrival.  She was actively seizing on arrival  Generalized seizure  Lasting 1-2 minutes.  Unconscious.  No incontinence tongue damage.

## 2014-10-27 NOTE — H&P (Signed)
PULMONARY / CRITICAL CARE MEDICINE   Name: Brenda MannanWanda Fagin MRN: [id number] DOB: Mar 25, 1961    ADMISSION DATE:  10/27/2014 CONSULTATION DATE:  10/27/2014  REFERRING MD :  Hyacinth MeekerMiller EDP  CHIEF COMPLAINT:  Seizure  INITIAL PRESENTATION: 53 y/o female with multiple medical problems including seizure disorder [facility] for seizure activity.  STUDIES:  11/14 CT head > motion degraded exam, no AICP 11/14 CT c-spine > No acute osseous injury of c-spine; some spondylosis  SIGNIFICANT EVENTS:    HISTORY OF PRESENT ILLNESS:  This is a 53 y/o female with a past medical history significant for Behcet's disease and seizure disorder who was just discharged from our facility for possible optic neuritis who was brought back by EMS on 11/14 AM for seizure activity.history was obtained by discussing the case with the ER staff as well as reviewing the chart as the patient was intubated when I was present. By history it sounds like she called her husband and reported that she "felt like she was going to have a seizure". EMS was called and she was witnessed having a tonic-clonic seizure. EMS administered multiple rounds of benzodiazepines without good result. In the emergency department she continued to have seizure activity. She was intubated for airway protection, placed on a propofol infusion, and given Dilantin. A CT of the cervical spine and head was negative.    PAST MEDICAL HISTORY :   has a past medical history of GERD (gastroesophageal reflux disease); Hyperlipidemia; History of blood transfusion; Chronic headaches; Phlebitis; History of colonic polyps; Behcet's disease; Tonic clonic seizures; Anxiety; and Kidney stones (1191,4782(1999,2001).  has past surgical history that includes Cholecystectomy; Tonsillectomy (1970); Appendectomy (1975); Lumbar fusion (1999); Abdominal hysterectomy (1988); Portacath placement (Left, 2008); Stone extraction with basket; Back surgery; Adenoidectomy (at 53 years  old); Oophorectomy (1987); and Radiology with anesthesia (N/A, 10/24/2014). Prior to Admission medications   Medication Sig Start Date End Date Taking? Authorizing Provider  ALPRAZolam Prudy Feeler(XANAX) 0.5 MG tablet Take 1 tablet (0.5 mg total) by mouth 3 (three) times daily as needed for anxiety. 10/09/14   Lorre Munroeegina W Baity, NP  amitriptyline (ELAVIL) 25 MG tablet TAKE 1 TABLET (25 MG TOTAL) BY MOUTH AT BEDTIME. 10/24/14   Lorre Munroeegina W Baity, NP  atorvastatin (LIPITOR) 10 MG tablet Take 1 tablet (10 mg total) by mouth daily. 10/26/14   Leroy SeaPrashant K Singh, MD  cyclophosphamide (CYTOXAN) 25 MG tablet Take 75 mg by mouth daily. Give on an empty stomach 1 hour before or 2 hours after meals.    Historical Provider, MD  diazepam (VALIUM) 5 MG tablet Take 5 mg by mouth every 6 (six) hours as needed for anxiety.    Historical Provider, MD  divalproex (DEPAKOTE) 250 MG DR tablet Take 2 tablets (500 mg total) by mouth 3 (three) times daily. 09/02/14   Jeanella CrazeBrandi L Ollis, NP  gabapentin (NEURONTIN) 300 MG capsule Take 3 capsules (900 mg total) by mouth 3 (three) times daily. 10/17/14   Lorre Munroeegina W Baity, NP  HYDROcodone-acetaminophen (NORCO/VICODIN) 5-325 MG per tablet Take 1 tablet by mouth 3 (three) times daily as needed for moderate pain. 10/15/14   Lorre Munroeegina W Baity, NP  HYDROcodone-acetaminophen (NORCO/VICODIN) 5-325 MG per tablet Take 1 tablet by mouth 3 (three) times daily as needed for moderate pain. Patient not taking: Reported on 10/23/2014 10/15/14   Lorre Munroeegina W Baity, NP  levETIRAcetam (KEPPRA) 500 MG tablet Take 3 tablets (1,500 mg total) by mouth 2 (two) times daily. 09/02/14   Brandi  Assunta Curtis, NP  predniSONE (DELTASONE) 10 MG tablet Take 10 mg by mouth daily with breakfast.    Historical Provider, MD  promethazine (PHENERGAN) 50 MG tablet Take 0.5 tablets (25 mg total) by mouth every 6 (six) hours as needed for nausea or vomiting. 10/22/14   Lorre Munroe, NP  sertraline (ZOLOFT) 50 MG tablet Take 1 tablet (50 mg total) by mouth  daily. Patient not taking: Reported on 10/23/2014 09/02/14   Jeanella Craze, NP  sulfamethoxazole-trimethoprim (BACTRIM DS) 800-160 MG per tablet Take 1 tablet by mouth every other day. Take every day because she is on Chemo Per patient    Historical Provider, MD  zolpidem (AMBIEN) 10 MG tablet Take 1 tablet (10 mg total) by mouth at bedtime as needed for sleep. Patient not taking: Reported on 10/23/2014 10/22/14 11/21/14  Lorre Munroe, NP   Allergies  Allergen Reactions  . Hepatitis B Vaccine Hives and Shortness Of Breath    Causes wheezing   . Imitrex [Sumatriptan] Other (See Comments)    Makes her have SVT and chest pain  . Iohexol Anaphylaxis, Hives and Shortness Of Breath  . Codeine Itching and Rash  . Keppra Xr [Levetiracetam] Nausea Only    FAMILY HISTORY:  indicated that her mother is alive. She indicated that her father is deceased. She indicated that her sister is deceased. She indicated that her brother is deceased.  SOCIAL HISTORY:  reports that she has never smoked. She has never used smokeless tobacco. She reports that she does not drink alcohol or use illicit drugs.  REVIEW OF SYSTEMS:  Cannot obtain due to altered mental status and intubation  SUBJECTIVE:   VITAL SIGNS: Temp:  [97.9 F (36.6 C)-98.8 F (37.1 C)] 97.9 F (36.6 C) (11/14 0714) Pulse Rate:  [51-87] 56 (11/14 0714) Resp:  [14-18] 14 (11/14 0714) BP: (51-149)/(33-96) 66/47 mmHg (11/14 0714) SpO2:  [92 %-100 %] 100 % (11/14 0714) FiO2 (%):  [100 %] 100 % (11/14 0607) Weight:  [88.451 kg (195 lb)] 88.451 kg (195 lb) (11/14 0601) HEMODYNAMICS:   VENTILATOR SETTINGS: Vent Mode:  [-] PRVC FiO2 (%):  [100 %] 100 % Set Rate:  [14 bmp] 14 bmp Vt Set:  [550 mL] 550 mL PEEP:  [5 cmH20] 5 cmH20 Plateau Pressure:  [18 cmH20] 18 cmH20 INTAKE / OUTPUT: No intake or output data in the 24 hours ending 10/27/14 0740  PHYSICAL EXAMINATION: General:  Sedated on vent Neuro:  Sedated on ventilator HEENT:   Neck collar in place right pupil dilated, left normal size and reactive to light endotracheal tube in place Cardiovascular:  Bradycardic rate and rhythm no murmurs gallops or rubs Lungs:  Clear to auscultation bilaterally Abdomen:  Infrequent bowel sounds, soft, nondistended Musculoskeletal:  Normal bulk and tone Skin:  Tattoos noted, no obvious skin breakdown or rash  LABS:  CBC  Recent Labs Lab 10/23/14 1741 10/24/14 0715 10/27/14 0552 10/27/14 0603  WBC 7.7 6.0 12.4*  --   HGB 12.0 11.7* 11.5* 11.6*  HCT 35.0* 34.2* 34.1* 34.0*  PLT 268 306 320  --    Coag's  Recent Labs Lab 10/27/14 0552  INR 1.09   BMET  Recent Labs Lab 10/23/14 1741 10/24/14 0715 10/27/14 0552 10/27/14 0603  NA 139 138 142 141  K 4.3 4.2 4.0 3.9  CL 104 103 103 109  CO2 19 19 23   --   BUN 10 15 25* 23  CREATININE 0.64 0.63 0.72 0.80  GLUCOSE 91 176*  115* 116*   Electrolytes  Recent Labs Lab 10/23/14 1741 10/24/14 0715 10/27/14 0552  CALCIUM 9.4 9.2 9.1   Sepsis Markers  Recent Labs Lab 10/27/14 0602  LATICACIDVEN 2.27*   ABG  Recent Labs Lab 10/27/14 0716  PHART 7.456*  PCO2ART 34.3*  PO2ART 331.0*   Liver Enzymes  Recent Labs Lab 10/24/14 0715 10/27/14 0552  AST 40* 14  ALT 55* 28  ALKPHOS 84 68  BILITOT 0.2* 0.2*  ALBUMIN 3.5 3.5   Cardiac Enzymes No results for input(s): TROPONINI, PROBNP in the last 168 hours. Glucose  Recent Labs Lab 10/24/14 0758 10/26/14 0814 10/27/14 0606  GLUCAP 155* 207* 114*    Imaging No results found.   ASSESSMENT / PLAN:  PULMONARY OETT November 14>  A: Acute respiratory failure with hypercarbia due to inability to protect airway P:   -full vent support -Tidal volume 8 mL per kilogram -daily chest x-ray -Daily wake up assessment -Daily spontaneous breathing trial  CARDIOVASCULAR CVL not indicated A: Sinus bradycardia due to propofol  Hypotension due to propofol  P:  -stop  propofol -telemetry -fluid resuscitation -[name]-Synephrine titrated for MAP > 65 -hydrocortisone 50 mg every 6 hours  RENAL A:  No acute issues P:   -Monitor BMET and UOP -Replace electrolytes as needed   GASTROINTESTINAL A:  No acute issues P:   -OG-tube -Pantoprazole for stress ulcer prophylaxis  HEMATOLOGIC A:  No acute issues P:  -intermittent CBC  INFECTIOUS A:  Leukocytosis due to recent steroid administration, no sign of infection P:   Monitor for fever  ENDOCRINE A:  Likely relative adrenal insufficiency considering recent steroid use P:   -hydrocortisone 50 mg IV every 6 hours  NEUROLOGIC A:  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 Unclear if this is actual seizure activity or if she has had psychogenic convulsive activity. Currently intubated. Will maintain antiepileptic drugs and follow-up neurology recommendation.    CC time by me 45 minutes  Heber CarolinaBrent McQuaid, MD [facility] Pager: [phone] Cell: [phone] If no response, call [phone]  10/27/2014, 7:40 AM

## 2014-10-27 NOTE — ED Notes (Signed)
Lactic acid results given to Dr. Mora Bellmanni in trauma room C

## 2014-10-27 NOTE — ED Notes (Signed)
MD Reynolds at bedside. 

## 2014-10-27 NOTE — Procedures (Signed)
ELECTROENCEPHALOGRAM REPORT   Patient: Brenda Mcguire       Room #: 1O103M09 EEG No. ID: [id number] Age: 53 y.o.        Sex: female Referring Physician: Vassie LollAlva Report Date:  10/27/2014        Interpreting Physician: Thana FarrEYNOLDS,Alira Fretwell D  History: Brenda MannanWanda Ishikawa is an 53 y.o. female presenting with seizure-like activity.  History of pseudoseizures.  Medications:  Scheduled: . antiseptic oral rinse  7 mL Mouth Rinse QID  . chlorhexidine  15 mL Mouth Rinse BID  . etomidate      . feeding supplement (VITAL HIGH PROTEIN)  1,000 mL Per Tube Q24H  . gabapentin  300 mg Per Tube TID  . heparin  5,000 Units Subcutaneous 3 times per day  . hydrocortisone sodium succinate  50 mg Intravenous Q6H  . levETIRAcetam  1,500 mg Intravenous Q12H  . lidocaine (cardiac) 100 mg/215ml      . LORazepam      . midazolam  2 mg Intravenous Once  . pantoprazole (PROTONIX) IV  40 mg Intravenous Daily  . rocuronium      . sertraline  50 mg Oral Daily  . succinylcholine      . sulfamethoxazole-trimethoprim  1 tablet Oral QODAY  . topiramate  100 mg Per NG tube BID  . valproate sodium  1,000 mg Intravenous Once  . valproate sodium  500 mg Intravenous 3 times per day    Conditions of Recording:  This is a 16 channel EEG carried out with the patient in the ventilated and sedated state.  Description:  The background activity consists of a irregular poorly organized slow activity.  There is superimposed symmetrical sleep spindles and vertex central sharp transients.  The patient is alerted briefly during the recording and shows evidence of normal drowse with a poorly organized background rhythm consisting of a mixture of theta and delta activity.   Wakefulness could not be evaluated.   No epileptiform activity was noted.    Hyperventilation and intermittent photic stimulation were not performed.  IMPRESSION: This is a normal asleep electroencephalogram.  No epileptiform activity is noted.     Thana FarrLeslie Tylee Newby, MD [facility] [phone] 10/27/2014, 2:16 PM

## 2014-10-27 NOTE — ED Notes (Signed)
Io lt tibia by someone in the room.  Lost  Her rt forearm ems iv

## 2014-10-27 NOTE — ED Notes (Signed)
Etomidate 20mg  iv   Jon cook iv

## 2014-10-27 NOTE — Consult Note (Signed)
Reason for Consult:Status Epilepticus Referring Physician: Vassie Loll  CC: Seizure  HPI: Brenda Mcguire is an 53 y.o. female recently discharged on 10/26/14 after being admitted for decreased vision in the right eye with a dilated and fixed pupil and administration of steroids who presented with seizure-like activity.  Per chart it seems that the patient told her husband that she was going to have a seizure.  She was then noted to have what was documented as a tonic-clonic seizure.  EMS was called at that time and the patient was administered Versed 5mg .  She continued to have "seizure-like" activity and on presentation to the ED was intubated.  Seizure was described by ED staff as patient being unresponsive with tonic posturing.   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  During most recent hospitalization had multiple events with features highly suggestive of nonepileptic events.    Past Medical History  Diagnosis Date  . GERD (gastroesophageal reflux disease)   . Hyperlipidemia   . History of blood transfusion   . Chronic headaches   . Phlebitis   . History of colonic polyps   . 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 Tonic clonic seizures   . Anxiety     "situational stress"  . Kidney stones 1610,9604    Past Surgical History  Procedure Laterality Date  . Cholecystectomy    . Tonsillectomy  1970  . Appendectomy  1975  . Lumbar fusion  1999    L5-S1  . Abdominal hysterectomy  1988  . Portacath placement Left 2008  . Stone extraction with basket    . Back surgery      L5-S1 ALIF  . Adenoidectomy  at 53 years old  . Oophorectomy  1987  . Radiology with anesthesia N/A 10/24/2014    Procedure: RADIOLOGY WITH ANESTHESIA;  Surgeon: Medication Radiologist, MD;  Location: [facility] OR;  Service: Radiology;  Laterality: N/A;    Family History  Problem Relation Age of Onset  . Diabetes Mother   . Hyperlipidemia Mother   .  Stroke Mother   . Hypertension Mother   . Colon cancer Mother   . Breast cancer Mother   . Early death Father   . Heart attack Father   . Heart disease Father   . Hyperlipidemia Father   . COPD Sister     Social History:  reports that she has never smoked. She has never used smokeless tobacco. She reports that she does not drink alcohol or use illicit drugs.  Allergies  Allergen Reactions  . Hepatitis B Vaccine Hives and Shortness Of Breath    Causes wheezing   . Imitrex [Sumatriptan] Other (See Comments)    Makes her have SVT and chest pain  . Iohexol Anaphylaxis, Hives and Shortness Of Breath  . Codeine Itching and Rash  . Keppra Xr [Levetiracetam] Nausea Only    Medications:  I have reviewed the patient's current medications. Prior to Admission:  Prescriptions prior to admission  Medication Sig Dispense Refill Last Dose  . ALPRAZolam (XANAX) 0.5 MG tablet Take 1 tablet (0.5 mg total) by mouth 3 (three) times daily as needed for anxiety. 90 tablet 0 10/22/2014 at Unknown time  . amitriptyline (ELAVIL) 25 MG tablet TAKE 1 TABLET (25 MG TOTAL) BY MOUTH AT BEDTIME. 30 tablet 5   . atorvastatin (LIPITOR) 10 MG tablet Take 1 tablet (10 mg total) by mouth daily. 30 tablet 0   . cyclophosphamide (CYTOXAN)  25 MG tablet Take 75 mg by mouth daily. Give on an empty stomach 1 hour before or 2 hours after meals.   10/23/2014 at Unknown time  . diazepam (VALIUM) 5 MG tablet Take 5 mg by mouth every 6 (six) hours as needed for anxiety.   08/30/2014 at Unknown time  . divalproex (DEPAKOTE) 250 MG DR tablet Take 2 tablets (500 mg total) by mouth 3 (three) times daily. 60 tablet 0 10/23/2014 at Unknown time  . gabapentin (NEURONTIN) 300 MG capsule Take 3 capsules (900 mg total) by mouth 3 (three) times daily. 90 capsule 2 10/23/2014 at Unknown time  . HYDROcodone-acetaminophen (NORCO/VICODIN) 5-325 MG per tablet Take 1 tablet by mouth 3 (three) times daily as needed for moderate pain. 90 tablet 0  Past Week at Unknown time  . HYDROcodone-acetaminophen (NORCO/VICODIN) 5-325 MG per tablet Take 1 tablet by mouth 3 (three) times daily as needed for moderate pain. (Patient not taking: Reported on 10/23/2014) 90 tablet 0   . levETIRAcetam (KEPPRA) 500 MG tablet Take 3 tablets (1,500 mg total) by mouth 2 (two) times daily. 90 tablet 0 10/23/2014 at Unknown time  . predniSONE (DELTASONE) 10 MG tablet Take 10 mg by mouth daily with breakfast.   10/23/2014 at Unknown time  . promethazine (PHENERGAN) 50 MG tablet Take 0.5 tablets (25 mg total) by mouth every 6 (six) hours as needed for nausea or vomiting. 30 tablet 2 Past Week at Unknown time  . sertraline (ZOLOFT) 50 MG tablet Take 1 tablet (50 mg total) by mouth daily. (Patient not taking: Reported on 10/23/2014) 30 tablet 0   . sulfamethoxazole-trimethoprim (BACTRIM DS) 800-160 MG per tablet Take 1 tablet by mouth every other day. Take every day because she is on Chemo Per patient   10/22/2014 at Unknown time  . zolpidem (AMBIEN) 10 MG tablet Take 1 tablet (10 mg total) by mouth at bedtime as needed for sleep. (Patient not taking: Reported on 10/23/2014) 30 tablet 0    Scheduled: . antiseptic oral rinse  7 mL Mouth Rinse QID  . chlorhexidine  15 mL Mouth Rinse BID  . divalproex  500 mg Oral TID  . etomidate      . feeding supplement (VITAL HIGH PROTEIN)  1,000 mL Per Tube Q24H  . heparin  5,000 Units Subcutaneous 3 times per day  . hydrocortisone sodium succinate  50 mg Intravenous Q6H  . levETIRAcetam  1,500 mg Oral BID  . lidocaine (cardiac) 100 mg/75ml      . LORazepam      . midazolam  2 mg Intravenous Once  . pantoprazole (PROTONIX) IV  40 mg Intravenous Daily  . rocuronium      . sertraline  50 mg Oral Daily  . succinylcholine      . sulfamethoxazole-trimethoprim  1 tablet Oral QODAY    ROS: Unable to obtain  Physical Examination: Blood pressure 104/72, pulse 84, temperature 98.8 F (37.1 C), resp. rate 22, height 5\' 6"  (1.676 m),  weight 88.451 kg (195 lb), SpO2 100 %.  Neurologic Examination Mental Status: Patient does not respond to verbal stimuli.  Does not respond to deep sternal rub.  Does not follow commands.  No verbalizations noted.  Cranial Nerves: II: patient does not respond confrontation bilaterally, pupils right 6 mm and unreactive, left 3 mm,and reactive bilaterally III,IV,VI: doll's response absent bilaterally.  V,VII: corneal reflex absent bilaterally  VIII: patient does not respond to verbal stimuli IX,X: gag reflex reduced, XI: trapezius strength unable to  test bilaterally XII: tongue strength unable to test Motor: Extremities flaccid throughout.  Some spontaneous movement noted in the LUE.  No purposeful movements noted. Sensory: Does not respond to noxious stimuli in any extremity. Deep Tendon Reflexes:  Absent throughout. Plantars: mute bilaterally Cerebellar: Unable to perform    Laboratory Studies:   Basic Metabolic Panel:  Recent Labs Lab 10/23/14 1741 10/24/14 0715 10/27/14 0552 10/27/14 0603  NA 139 138 142 141  K 4.3 4.2 4.0 3.9  CL 104 103 103 109  CO2 19 19 23   --   GLUCOSE 91 176* 115* 116*  BUN 10 15 25* 23  CREATININE 0.64 0.63 0.72 0.80  CALCIUM 9.4 9.2 9.1  --     Liver Function Tests:  Recent Labs Lab 10/24/14 0715 10/27/14 0552  AST 40* 14  ALT 55* 28  ALKPHOS 84 68  BILITOT 0.2* 0.2*  PROT 6.8 6.7  ALBUMIN 3.5 3.5    Recent Labs Lab 10/27/14 0552  LIPASE 10*   No results for input(s): AMMONIA in the last 168 hours.  CBC:  Recent Labs Lab 10/23/14 1741 10/24/14 0715 10/27/14 0552 10/27/14 0603  WBC 7.7 6.0 12.4*  --   NEUTROABS 5.2  --  9.1*  --   HGB 12.0 11.7* 11.5* 11.6*  HCT 35.0* 34.2* 34.1* 34.0*  MCV 93.8 93.4 96.1  --   PLT 268 306 320  --     Cardiac Enzymes:  Recent Labs Lab 10/27/14 0552  CKTOTAL 41    BNP: Invalid input(s): POCBNP  CBG:  Recent Labs Lab 10/24/14 0758 10/26/14 0814 10/27/14 0606   GLUCAP 155* 207* 114*    Microbiology: Results for orders placed or performed during the hospital encounter of 10/27/14  MRSA PCR Screening     Status: None   Collection Time: 10/27/14  8:54 AM  Result Value Ref Range Status   MRSA by PCR NEGATIVE NEGATIVE Final    Comment:        The GeneXpert MRSA Assay (FDA approved for NASAL specimens only), is one component of a comprehensive MRSA colonization surveillance program. It is not intended to diagnose MRSA infection nor to guide or monitor treatment for MRSA infections.     Coagulation Studies:  Recent Labs  10/27/14 0552  LABPROT 14.2  INR 1.09    Urinalysis:  Recent Labs Lab 10/27/14 0708  COLORURINE YELLOW  LABSPEC 1.017  PHURINE 7.0  GLUCOSEU NEGATIVE  HGBUR NEGATIVE  BILIRUBINUR NEGATIVE  KETONESUR NEGATIVE  PROTEINUR NEGATIVE  UROBILINOGEN 1.0  NITRITE NEGATIVE  LEUKOCYTESUR NEGATIVE    Lipid Panel:     Component Value Date/Time   CHOL 234* 03/02/2013 1115   TRIG 130 08/31/2014 2124   HDL 45.90 03/02/2013 1115   CHOLHDL 5 03/02/2013 1115   VLDL 16.6 03/02/2013 1115    HgbA1C:  Lab Results  Component Value Date   HGBA1C 5.8* 10/23/2014    Urine Drug Screen:     Component Value Date/Time   LABOPIA NONE DETECTED 10/27/2014 0708   LABOPIA NEGATIVE 11/11/2013 0600   COCAINSCRNUR NONE DETECTED 10/27/2014 0708   COCAINSCRNUR NEGATIVE 11/11/2013 0600   LABBENZ POSITIVE* 10/27/2014 0708   LABBENZ POSITIVE* 11/11/2013 0600   AMPHETMU NONE DETECTED 10/27/2014 0708   AMPHETMU NEGATIVE 11/11/2013 0600   THCU NONE DETECTED 10/27/2014 0708   LABBARB NONE DETECTED 10/27/2014 0708    Alcohol Level:  Recent Labs Lab 10/27/14 0552  ETH <11    Other results: EKG: normal sinus rhythm at 74  bpm.  Imaging: Ct Head Wo Contrast  10/27/2014   CLINICAL DATA:  Seizure disorder, patient unable to provide additional information.  EXAM: CT HEAD WITHOUT CONTRAST  TECHNIQUE: Contiguous axial images were  obtained from the base of the skull through the vertex without intravenous contrast.  COMPARISON:  MRI of the brain October 24, 2014 and CT of the head October 23, 2014  FINDINGS: Moderately motion degraded examination required Re imaging with mild image quality improvement.  The ventricles and sulci are normal. No intraparenchymal hemorrhage, mass effect nor midline shift. No acute large vascular territory infarcts.  No abnormal extra-axial fluid collections. Basal cisterns are patent.  No skull fracture. The included ocular globes and orbital contents are non-suspicious. The mastoid aircells and included paranasal sinuses are well-aerated. Patient is intubated severe RIGHT knee areas.  IMPRESSION: Motion degraded examination without definite acute intracranial process.   Electronically Signed   By: Awilda Metro   On: 10/27/2014 05:56   Ct Cervical Spine Wo Contrast  10/27/2014   CLINICAL DATA:  Seizures, hit left side of the head  EXAM: CT CERVICAL SPINE WITHOUT CONTRAST  TECHNIQUE: Multidetector CT imaging of the cervical spine was performed without intravenous contrast. Multiplanar CT image reconstructions were also generated.  COMPARISON:  None.  FINDINGS: The alignment is anatomic. The vertebral body heights are maintained. There is no acute fracture. There is no static listhesis. The prevertebral soft tissues are normal. The intraspinal soft tissues are not fully imaged on this examination due to poor soft tissue contrast, but there is no gross soft tissue abnormality.  The disc spaces are relatively well maintained. There is a small central disc protrusion at C4-5. There is a mild broad-based disc osteophyte complex at C5-6 and C6-7. There are bilateral uncovertebral degenerative changes at C5-6 resulting in foraminal encroachment bilaterally. There is bilateral uncovertebral degenerative changes C6-7 with mild bilateral foraminal encroachment.  The visualized portions of the lung apices  demonstrate no focal abnormality.  Endotracheal tube with the distal tip not visualized. Orogastric tube noted.  IMPRESSION: 1. No acute osseous injury of the cervical spine. 2. Cervical spine spondylosis as described above.   Electronically Signed   By: Elige Ko   On: 10/27/2014 07:03   Dg Chest Port 1 View  10/27/2014   CLINICAL DATA:  Seizures  EXAM: PORTABLE CHEST - 1 VIEW  COMPARISON:  09/01/2014  FINDINGS: Endotracheal tube with the tip 3 cm above the carina. Left-sided Port-A-Cath in satisfactory position. There is no focal parenchymal opacity, pleural effusion, or pneumothorax. The heart and mediastinal contours are unremarkable. The osseous structures are unremarkable.  IMPRESSION: 1. No active cardiopulmonary disease. 2. Endotracheal tube with the tip 3 cm above the carina.   Electronically Signed   By: Elige Ko   On: 10/27/2014 06:15     Assessment/Plan: 53 year old female admitted with seizure-like activity.  Currently patient intubated and sedated.  No further clinical seizure actvity noted.  Patient on anticonvulsant therapy at home.  Will re-institute these medications.  Recommendations: 1.  D/C Dilantin 2.  Depakote 1000mg  IV now then start 500mg  TID  3.  Once able to take po would start Neurontin and Topamax but for now will administer per NGT 4.  Keppra 1500mg  IV q12hours to start now 5.  Seizure precautions 6.  Will likely need to continue to give the patient some type of benzodiazepine since on xanax and Valium at home  This patient is critically ill and at significant risk  of neurological worsening, death and care requires constant monitoring of vital signs, hemodynamics,respiratory and cardiac monitoring, neurological assessment, discussion with family, other specialists and medical decision making of high complexity. I spent 60 minutes of neurocritical care time  in the care of  this patient.   Thana FarrLeslie Shahidah Nesbitt, MD [facility] [phone] 10/27/2014, 12:31 PM

## 2014-10-27 NOTE — Progress Notes (Signed)
Patient noted having clonic seizure activity, where the upper extremities extended in a decerebrate posturing position. Dr. Thad Rangereynolds from Neurology and Dr. Kendrick FriesMcQuaid from Critical Care contacted concerning the seizure activity. The patient had three seizures within fifteen minutes. Orders received for a bolus of Versed 2mg  from continuous Versed infusion and increase Versed rate to 8 mg/hr. Will continue to monitor.

## 2014-10-27 NOTE — Progress Notes (Signed)
EEG Completed; Results Pending  

## 2014-10-27 NOTE — ED Notes (Signed)
Pt entubated by dr Mora Bellmanoni

## 2014-10-27 NOTE — ED Notes (Signed)
The [pt arrived by gems from home with seizures and seizure history

## 2014-10-27 NOTE — ED Notes (Signed)
The pt   Was verbal a few  Seconds after she returned from c-t  She   Stated she had a porta-cath.  Iv rt thumb by bobby rn

## 2014-10-27 NOTE — Progress Notes (Signed)
eLink Physician-Brief Progress Note Patient Name: Wandra MannanWanda Pattillo DOB: 01-Nov-1961 MRN: [id number]   Date of Service  10/27/2014  HPI/Events of Note   On steroids   eICU Interventions   SSI ordered      Intervention Category Minor Interventions: Routine modifications to care plan (e.g. PRN medications for pain, fever)  Johnavon Mcclafferty R. 10/27/2014, 8:27 PM

## 2014-10-27 NOTE — ED Notes (Signed)
Lt port accessed with 19 gauge by jon cook rn

## 2014-10-27 NOTE — Progress Notes (Signed)
Pt arrives to 3M09 from ED at this time on current settings.

## 2014-10-27 NOTE — Progress Notes (Signed)
[facility]  Complaining of chest pain, radiates to left chest EKG normal Troponin slightly elevated No history of CAD that I can find in chart review  Also, seized this afternoon again when weaning versed  Requiring frequent fentanyl for sedation  Plan: -give ASA 325mg  now and daily -EKG daily -repeat troponin later today, may need heparin gtt but will hold off -versed gtt per neurology -fentanyl gtt for RASS -1  Heber CarolinaBrent McQuaid, MD [facility] Pager: [phone] Cell: [phone] If no response, call [phone]

## 2014-10-27 NOTE — ED Notes (Signed)
The pt arricved with unequal pupils lt  4.0 rt 8.0 non-reactive.  She was found in the floor at home.  Swelling to her rt temple area.  Abrasion to the lt forehead.   16 og by [employer] Corporationjon cook rn.

## 2014-10-27 NOTE — Progress Notes (Signed)
INITIAL NUTRITION ASSESSMENT  DOCUMENTATION CODES Per approved criteria  -Obesity Unspecified   INTERVENTION: Initiate Vital HP @ 20 ml/hr via OGT and increase by 10 ml every 4 hours to goal rate of 40 ml/hr.   Add 30 ml Prostat TID   Tube feeding regimen provides 1260 kcal (97% energy and 100% of protein needs), 129 grams of protein, and 803 ml of H2O.   Propofol providing additional 330 kcal q 24hr at current rate  NUTRITION DIAGNOSIS: Inadequate oral intake related to inability to eat as evidenced by NPO status .   Goal: Enteral nutrition to provide 60-70% of estimated calorie needs (22-25 kcals/kg ideal body weight) and 100% of estimated protein needs, based on ASPEN guidelines for hypocaloric, high protein feeding in critically ill obese individuals  Monitor:  Respiratory status, nutrition support measures, labs and weight changes  Reason for Assessment: consult to initiate and manage tube feeding  53 y.o. female   ASSESSMENT: 53 y/o female with multiple medical problems including seizure disorder [facility] seizure activity.  Patient is currently intubated for airway protection on ventilator support.  MV: 7.1 L/min Temp (24hrs), Avg:98.2 F (36.8 C), Min:96.8 F (36 C), Max:98.8 F (37.1 C)  Propofol: 12.5 ml/hr (provides 330 kcal lipids q 24 hr at current rate)  Height: Ht Readings from Last 1 Encounters:  10/27/14 5\' 6"  (1.676 m)    Weight: Wt Readings from Last 1 Encounters:  10/27/14 195 lb (88.451 kg)    Ideal Body Weight: 130# (59 kg)  % Ideal Body Weight: 150%  Wt Readings from Last 10 Encounters:  10/27/14 195 lb (88.451 kg)  10/23/14 186 lb 4.6 oz (84.5 kg)  09/02/14 181 lb 3.5 oz (82.2 kg)  07/18/14 184 lb 12 oz (83.802 kg)  06/27/14 181 lb (82.101 kg)  06/13/14 182 lb 3.2 oz (82.645 kg)  06/07/14 184 lb (83.462 kg)  05/31/14 182 lb 8.7 oz (82.8 kg)  03/16/14 189 lb (85.73 kg)  03/13/14 190 lb (86.183 kg)     Usual Body Weight: 180-190#  % Usual Body Weight: 103%  BMI:  Body mass index is 31.49 kg/(m^2). obesity class I  Estimated Nutritional Needs: Kcal: 1610 (permissive underfeeding goal:1298-1475 kcal) Protein: 118-135 gr Fluid: 1.6 liters daily  Skin: intact  Diet Order: Diet NPO time specified  EDUCATION NEEDS: -Education not appropriate at this time   Intake/Output Summary (Last 24 hours) at 10/27/14 1358 Last data filed at 10/27/14 1300  Gross per 24 hour  Intake     62 ml  Output   1775 ml  Net  -1713 ml    Last BM: 11/11 small  Labs:   Recent Labs Lab 10/23/14 1741 10/24/14 0715 10/27/14 0552 10/27/14 0603  NA 139 138 142 141  K 4.3 4.2 4.0 3.9  CL 104 103 103 109  CO2 19 19 23   --   BUN 10 15 25* 23  CREATININE 0.64 0.63 0.72 0.80  CALCIUM 9.4 9.2 9.1  --   GLUCOSE 91 176* 115* 116*    CBG (last 3)   Recent Labs  10/26/14 0814 10/27/14 0606  GLUCAP 207* 114*    Scheduled Meds: . antiseptic oral rinse  7 mL Mouth Rinse QID  . chlorhexidine  15 mL Mouth Rinse BID  . etomidate      . feeding supplement (VITAL HIGH PROTEIN)  1,000 mL Per Tube Q24H  . gabapentin  300 mg Per Tube TID  . heparin  5,000 Units Subcutaneous 3 times per day  . hydrocortisone sodium succinate  50 mg Intravenous Q6H  . levETIRAcetam  1,500 mg Intravenous Q12H  . lidocaine (cardiac) 100 mg/105ml      . LORazepam      . midazolam  2 mg Intravenous Once  . pantoprazole (PROTONIX) IV  40 mg Intravenous Daily  . rocuronium      . sertraline  50 mg Oral Daily  . succinylcholine      . sulfamethoxazole-trimethoprim  1 tablet Oral QODAY  . topiramate  100 mg Per NG tube BID  . valproate sodium  1,000 mg Intravenous Once  . valproate sodium  500 mg Intravenous 3 times per day    Continuous Infusions: . midazolam (VERSED) infusion 8 mg/hr (10/27/14 1346)  . phenylephrine ([name]-SYNEPHRINE) Adult infusion      Past Medical History  Diagnosis Date  . GERD  (gastroesophageal reflux disease)   . Hyperlipidemia   . History of blood transfusion   . Chronic headaches   . Phlebitis   . History of colonic polyps   . 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 Tonic clonic seizures   . Anxiety     "situational stress"  . Kidney stones [id number],2001    Past Surgical History  Procedure Laterality Date  . Cholecystectomy    . Tonsillectomy  1970  . Appendectomy  1975  . Lumbar fusion  1999    L5-S1  . Abdominal hysterectomy  1988  . Portacath placement Left 2008  . Stone extraction with basket    . Back surgery      L5-S1 ALIF  . Adenoidectomy  at 53 years old  . Oophorectomy  1987  . Radiology with anesthesia N/A 10/24/2014    Procedure: RADIOLOGY WITH ANESTHESIA;  Surgeon: Medication Radiologist, MD;  Location: [facility] OR;  Service: Radiology;  Laterality: N/A;    Royann ShiversLynn Goerge Mohr MS,RD,CSG,LDN Office: [phone] Pager: [phone]

## 2014-10-27 NOTE — ED Notes (Signed)
Lidocaine gven 100mg  by jon cook iv

## 2014-10-27 NOTE — Progress Notes (Signed)
CRITICAL VALUE ALERT  Critical value received:  Troponin I  Date of notification:  10/27/14   Time of notification:  1315  Critical value read back:Yes.    Nurse who received alert:  J.Daleen Squibbhomasson, RN  MD notified (1st page):  Dr. Kendrick FriesMcQuaid  Time of first page:  1316  MD notified (2nd page):  Time of second page:  Responding MD:  Dr. Kendrick FriesMcQuaid   Time MD responded:  [phone]

## 2014-10-27 NOTE — ED Notes (Signed)
Pt returned from c-t again for a c-spine.  .   Ac notified that the pts husband is a pt upstairs and the pt wanted him to know before she was intubated

## 2014-10-27 NOTE — ED Provider Notes (Signed)
CSN: [id number]     Arrival date & time 10/27/14  0522 History   First MD Initiated Contact with Patient 10/27/14 0534     Chief Complaint  Patient presents with  . Seizures     (Consider location/radiation/quality/duration/timing/severity/associated sxs/prior Treatment) Patient is a 53 y.o. female presenting with seizures.  Seizures  Brenda Mcguire is a 53 y.o. female with past medical history of tonic-clonic seizures, Behcet's disease, hyperlipidemia, GERD presenting today with seizures. History was obtained by EMS as the patient had a change in mental status. Per EMS, she told the husband that she felt like she was going to have a seizure followed by having a tonic-clonic seizure. Upon arrival EMS gave 5 mg of Versed IM. They were not initially able to obtain IV access. Despite this she seized again, they did have access at this time and she got another 5 mg of IV Versed. Upon arrival to the emergency department, patient was witnessed to have another tonic-clonic seizure. Quick physical exam revealed a blown pupil on the right side and patient was sent immediately to CT scan.   Past Medical History  Diagnosis Date  . GERD (gastroesophageal reflux disease)   . Hyperlipidemia   . History of blood transfusion   . Chronic headaches   . Phlebitis   . History of colonic polyps   . 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 Tonic clonic seizures   . Anxiety     "situational stress"  . Kidney stones [id number]   Past Surgical History  Procedure Laterality Date  . Cholecystectomy    . Tonsillectomy  1970  . Appendectomy  1975  . Lumbar fusion  1999    L5-S1  . Abdominal hysterectomy  1988  . Portacath placement Left 2008  . Stone extraction with basket    . Back surgery      L5-S1 ALIF  . Adenoidectomy  at 53 years old  . Oophorectomy  1987  . Radiology with anesthesia N/A 10/24/2014    Procedure: RADIOLOGY WITH ANESTHESIA;  Surgeon: Medication Radiologist, MD;  Location: [facility] OR;   Service: Radiology;  Laterality: N/A;   Family History  Problem Relation Age of Onset  . Diabetes Mother   . Hyperlipidemia Mother   . Stroke Mother   . Hypertension Mother   . Colon cancer Mother   . Breast cancer Mother   . Early death Father   . Heart attack Father   . Heart disease Father   . Hyperlipidemia Father   . COPD Sister    History  Substance Use Topics  . Smoking status: Never Smoker   . Smokeless tobacco: Never Used  . Alcohol Use: No   OB History    No data available     Review of Systems  Unable to perform ROS: Mental status change  Neurological: Positive for seizures.      Allergies  Hepatitis b vaccine; Imitrex; Iohexol; Codeine; and Keppra xr  Home Medications   Prior to Admission medications   Medication Sig Start Date End Date Taking? Authorizing Provider  ALPRAZolam Prudy Feeler(XANAX) 0.5 MG tablet Take 1 tablet (0.5 mg total) by mouth 3 (three) times daily as needed for anxiety. 10/09/14   Lorre Munroeegina W Baity, NP  amitriptyline (ELAVIL) 25 MG tablet TAKE 1 TABLET (25 MG TOTAL) BY MOUTH AT BEDTIME. 10/24/14   Lorre Munroeegina W Baity, NP  atorvastatin (LIPITOR) 10 MG tablet Take 1 tablet (10 mg total) by mouth daily. 10/26/14   Prashant  Curlene Labrum, MD  cyclophosphamide (CYTOXAN) 25 MG tablet Take 75 mg by mouth daily. Give on an empty stomach 1 hour before or 2 hours after meals.    Historical Provider, MD  diazepam (VALIUM) 5 MG tablet Take 5 mg by mouth every 6 (six) hours as needed for anxiety.    Historical Provider, MD  divalproex (DEPAKOTE) 250 MG DR tablet Take 2 tablets (500 mg total) by mouth 3 (three) times daily. 09/02/14   Jeanella Craze, NP  gabapentin (NEURONTIN) 300 MG capsule Take 3 capsules (900 mg total) by mouth 3 (three) times daily. 10/17/14   Lorre Munroe, NP  HYDROcodone-acetaminophen (NORCO/VICODIN) 5-325 MG per tablet Take 1 tablet by mouth 3 (three) times daily as needed for moderate pain. 10/15/14   Lorre Munroe, NP  HYDROcodone-acetaminophen  (NORCO/VICODIN) 5-325 MG per tablet Take 1 tablet by mouth 3 (three) times daily as needed for moderate pain. Patient not taking: Reported on 10/23/2014 10/15/14   Lorre Munroe, NP  levETIRAcetam (KEPPRA) 500 MG tablet Take 3 tablets (1,500 mg total) by mouth 2 (two) times daily. 09/02/14   Jeanella Craze, NP  predniSONE (DELTASONE) 10 MG tablet Take 10 mg by mouth daily with breakfast.    Historical Provider, MD  promethazine (PHENERGAN) 50 MG tablet Take 0.5 tablets (25 mg total) by mouth every 6 (six) hours as needed for nausea or vomiting. 10/22/14   Lorre Munroe, NP  sertraline (ZOLOFT) 50 MG tablet Take 1 tablet (50 mg total) by mouth daily. Patient not taking: Reported on 10/23/2014 09/02/14   Jeanella Craze, NP  sulfamethoxazole-trimethoprim (BACTRIM DS) 800-160 MG per tablet Take 1 tablet by mouth every other day. Take every day because she is on Chemo Per patient    Historical Provider, MD  zolpidem (AMBIEN) 10 MG tablet Take 1 tablet (10 mg total) by mouth at bedtime as needed for sleep. Patient not taking: Reported on 10/23/2014 10/22/14 11/21/14  Lorre Munroe, NP   BP 149/96 mmHg  Pulse 87  Resp 14  Wt 195 lb (88.451 kg)  SpO2 100% Physical Exam  Constitutional: She appears well-developed and well-nourished. She appears distressed.  HENT:  Head: Normocephalic and atraumatic.  Left periorbital bruising and swelling. Small abrasion,No laceration.  No bony step-offs or active hemorrhage.  Eyes: Conjunctivae and EOM are normal. No scleral icterus.  Right pupil is 6 mm and nonreactive, left pupil is 3 mm and reactive.  Neck: Normal range of motion. Neck supple. No JVD present. No tracheal deviation present. No thyromegaly present.  Cardiovascular: Normal rate, regular rhythm and normal heart sounds.  Exam reveals no gallop and no friction rub.   No murmur heard. Pulmonary/Chest: Effort normal and breath sounds normal. No respiratory distress. She has no wheezes. She exhibits no  tenderness.  Abdominal: Soft. Bowel sounds are normal. She exhibits no distension and no mass. There is no tenderness. There is no rebound and no guarding.  Soft tissue bruising noted in the lower abdomen.  Lymphadenopathy:    She has no cervical adenopathy.  Skin: Skin is warm and dry. No rash noted. She is not diaphoretic. No erythema. No pallor.  Nursing note and vitals reviewed.   ED Course  Procedures (including critical care time) Labs Review Labs Reviewed  CBC WITH DIFFERENTIAL - Abnormal; Notable for the following:    WBC 12.4 (*)    RBC 3.55 (*)    Hemoglobin 11.5 (*)    HCT 34.1 (*)  Neutro Abs 9.1 (*)    All other components within normal limits  COMPREHENSIVE METABOLIC PANEL - Abnormal; Notable for the following:    Glucose, Bld 115 (*)    BUN 25 (*)    Total Bilirubin 0.2 (*)    Anion gap 16 (*)    All other components within normal limits  LIPASE, BLOOD - Abnormal; Notable for the following:    Lipase 10 (*)    All other components within normal limits  URINE RAPID DRUG SCREEN (HOSP PERFORMED) - Abnormal; Notable for the following:    Benzodiazepines POSITIVE (*)    All other components within normal limits  I-STAT CHEM 8, ED - Abnormal; Notable for the following:    Glucose, Bld 116 (*)    Calcium, Ion 1.11 (*)    Hemoglobin 11.6 (*)    HCT 34.0 (*)    All other components within normal limits  I-STAT CG4 LACTIC ACID, ED - Abnormal; Notable for the following:    Lactic Acid, Venous 2.27 (*)    All other components within normal limits  CBG MONITORING, ED - Abnormal; Notable for the following:    Glucose-Capillary 114 (*)    All other components within normal limits  I-STAT VENOUS BLOOD GAS, ED - Abnormal; Notable for the following:    pH, Ven 7.376 (*)    Bicarbonate 28.1 (*)    All other components within normal limits  I-STAT ARTERIAL BLOOD GAS, ED - Abnormal; Notable for the following:    pH, Arterial 7.456 (*)    pCO2 arterial 34.3 (*)    pO2,  Arterial 331.0 (*)    Bicarbonate 24.3 (*)    All other components within normal limits  MRSA PCR SCREENING  URINE CULTURE  PROTIME-INR  URINALYSIS, ROUTINE W REFLEX MICROSCOPIC  PREGNANCY, URINE  CK  ETHANOL  BLOOD GAS, ARTERIAL  TROPONIN I  I-STAT TROPOININ, ED  I-STAT BETA HCG BLOOD, ED ([facility], [facility], [facility] ONLY)    Imaging Review Ct Head Wo Contrast  10/27/2014   CLINICAL DATA:  Seizure disorder, patient unable to provide additional information.  EXAM: CT HEAD WITHOUT CONTRAST  TECHNIQUE: Contiguous axial images were obtained from the base of the skull through the vertex without intravenous contrast.  COMPARISON:  MRI of the brain October 24, 2014 and CT of the head October 23, 2014  FINDINGS: Moderately motion degraded examination required Re imaging with mild image quality improvement.  The ventricles and sulci are normal. No intraparenchymal hemorrhage, mass effect nor midline shift. No acute large vascular territory infarcts.  No abnormal extra-axial fluid collections. Basal cisterns are patent.  No skull fracture. The included ocular globes and orbital contents are non-suspicious. The mastoid aircells and included paranasal sinuses are well-aerated. Patient is intubated severe RIGHT knee areas.  IMPRESSION: Motion degraded examination without definite acute intracranial process.   Electronically Signed   By: Awilda Metroourtnay  Bloomer   On: 10/27/2014 05:56   Dg Chest Port 1 View  10/27/2014   CLINICAL DATA:  Seizures  EXAM: PORTABLE CHEST - 1 VIEW  COMPARISON:  09/01/2014  FINDINGS: Endotracheal tube with the tip 3 cm above the carina. Left-sided Port-A-Cath in satisfactory position. There is no focal parenchymal opacity, pleural effusion, or pneumothorax. The heart and mediastinal contours are unremarkable. The osseous structures are unremarkable.  IMPRESSION: 1. No active cardiopulmonary disease. 2. Endotracheal tube with the tip 3 cm above the carina.   Electronically Signed   By: Elige KoHetal  Patel    On:  10/27/2014 06:15     EKG Interpretation None      MDM   Final diagnoses:  Seizure    Upon arrival to the emergency department, patient was witnessed seizing. Blown pupil was seen and patient sent to CT scan immediately. CT scan as read above is negative for increased intracranial pressure or herniation. 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 Due to patient having seizure-like activity for approximately one hour without coming back to baseline per EMS, the decision was made to intubate the patient for status. We did not initially know that the patient had a port and multiple attempts at an IV were failed.  I placed a LLE IO line for resuscitation and administration of intubation meds.  She will be kept sedated with propofol and also given a bolus of Dilantin as she has a Keppra allergy.  Infectious workup will be obtained. Critical care was called for admission. Patient will need EEG to evaluate for ongoing seizures, which I called neurology consultation for.  I was called to the bedside at change of shift for hypotension.  Advised to stop propofol drip and continue bolusing IVF.  SBP rose from 50s to 70s.  I believe this is transient due to all medication given and intubation, no need for pressors right now.  This was told to oncoming physician, Dr. Hyacinth Meeker.  INTUBATION Performed by: Tomasita Crumble  Required items: required blood products, implants, devices, and special equipment available Patient identity confirmed: provided demographic data and hospital-assigned identification number Time out: Immediately prior to procedure a "time out" was called to verify the correct patient, procedure, equipment, support staff and site/side marked as required.  Indications: status epilepticus  Intubation method: direct laryngoscopy  Preoxygenation: 100%BVMand 15 L nasal cannula  Sedatives: 20 mgEtomidate Paralytic: 100mg   Rocuronium  Tube Size: 8-0 cuffed  Post-procedure assessment: chest rise and ETCO2 monitor Breath sounds: equal and absent over the epigastrium Tube secured with: ETT holder Chest x-ray interpreted by radiologist and me.  Chest x-ray findings: endotracheal tube in appropriate position  Patient tolerated the procedure well with no immediate complications.     CRITICAL CARE Performed by: Tomasita Crumble   Total critical care time: 40 min  Critical care time was exclusive of separately billable procedures and treating other patients.  Critical care was necessary to treat or prevent imminent or life-threatening deterioration.  Critical care was time spent personally by me on the following activities: development of treatment plan with patient and/or surrogate as well as nursing, discussions with consultants, evaluation of patient's response to treatment, examination of patient, obtaining history from patient or surrogate, ordering and performing treatments and interventions, ordering and review of laboratory studies, ordering and review of radiographic studies, pulse oximetry and re-evaluation of patient's condition.      Tomasita Crumble, MD 10/27/14 1400

## 2014-10-27 NOTE — ED Notes (Signed)
Rocuronium 100mg  iv per jon cook rn iv

## 2014-10-28 ENCOUNTER — Encounter (HOSPITAL_COMMUNITY): Payer: Self-pay | Admitting: *Deleted

## 2014-10-28 DIAGNOSIS — I214 Non-ST elevation (NSTEMI) myocardial infarction: Secondary | ICD-10-CM

## 2014-10-28 DIAGNOSIS — J9601 Acute respiratory failure with hypoxia: Secondary | ICD-10-CM

## 2014-10-28 LAB — URINE CULTURE
CULTURE: NO GROWTH
Colony Count: NO GROWTH

## 2014-10-28 LAB — GLUCOSE, CAPILLARY
GLUCOSE-CAPILLARY: 149 mg/dL — AB (ref 70–99)
GLUCOSE-CAPILLARY: 106 mg/dL — AB (ref 70–99)
Glucose-Capillary: 159 mg/dL — ABNORMAL HIGH (ref 70–99)
Glucose-Capillary: 125 mg/dL — ABNORMAL HIGH (ref 70–99)

## 2014-10-28 MED ORDER — INSULIN ASPART 100 UNIT/ML ~~LOC~~ SOLN: 0.0000 [IU] | Freq: Every day | SUBCUTANEOUS | Status: DC

## 2014-10-28 MED ORDER — LEVETIRACETAM 750 MG PO TABS
1500.0000 mg | ORAL_TABLET | Freq: Two times a day (BID) | ORAL | Status: DC
  Administered 2014-10-28 – 2014-10-29 (×2): 1500 mg via ORAL
  Filled 2014-10-28 (×3): qty 2

## 2014-10-28 MED ORDER — GABAPENTIN 250 MG/5ML PO SOLN
300.0000 mg | Freq: Three times a day (TID) | ORAL | Status: DC
  Administered 2014-10-28: 300 mg via ORAL
  Filled 2014-10-28 (×4): qty 6

## 2014-10-28 MED ORDER — FENTANYL CITRATE 0.05 MG/ML IJ SOLN: 12.5000 ug | INTRAMUSCULAR | Status: DC | PRN

## 2014-10-28 MED ORDER — ACETAMINOPHEN 325 MG PO TABS
650.0000 mg | ORAL_TABLET | ORAL | Status: DC | PRN
Start: 2014-10-28 — End: 2014-10-29
  Administered 2014-10-28: 650 mg via ORAL
  Filled 2014-10-28: qty 2

## 2014-10-28 MED ORDER — IBUPROFEN 400 MG PO TABS
400.0000 mg | ORAL_TABLET | Freq: Once | ORAL | Status: AC
  Administered 2014-10-28: 400 mg via ORAL
  Filled 2014-10-28: qty 1

## 2014-10-28 MED ORDER — INSULIN ASPART 100 UNIT/ML ~~LOC~~ SOLN
0.0000 [IU] | Freq: Three times a day (TID) | SUBCUTANEOUS | Status: DC
  Administered 2014-10-28 – 2014-10-29 (×3): 2 [IU] via SUBCUTANEOUS

## 2014-10-28 MED ORDER — GABAPENTIN 300 MG PO CAPS
300.0000 mg | ORAL_CAPSULE | Freq: Three times a day (TID) | ORAL | Status: DC
  Administered 2014-10-28 – 2014-10-29 (×3): 300 mg via ORAL
  Filled 2014-10-28 (×5): qty 1

## 2014-10-28 NOTE — Procedures (Signed)
Extubation Procedure Note  Patient Details:   Name: Wandra MannanWanda Testerman DOB: Dec 30, 1960 MRN: [id number]   Airway Documentation:  Airway 8 mm (Active)  Secured at (cm) 24 cm 10/28/2014  7:47 AM  Measured From Lips 10/28/2014  7:47 AM  Secured Location Center 10/28/2014  7:47 AM  Secured By [employer] Tube Holder 10/28/2014  7:47 AM  Tube Holder Repositioned Yes 10/28/2014  7:47 AM  Cuff Pressure (cm H2O) 28 cm H2O 10/28/2014  3:15 AM  Site Condition Dry 10/28/2014  7:47 AM   Suctioned ett and oral cavity prior to extubation, placed on 4lpm [place].  Evaluation  O2 sats: stable throughout Complications: No apparent complications Patient did tolerate procedure well. Bilateral Breath Sounds: Clear Suctioning: Airway Yes  Renae FickleScott, Julaine Zimny Michelle 10/28/2014, 11:03 AM

## 2014-10-28 NOTE — Progress Notes (Signed)
Subjective: Patient without further seizure like activity overnight.  On home anticonvulsant therapy.  On Versed and Fentanyl but able to communicate by writing.    Objective: Current vital signs: BP 111/71 mmHg  Pulse 68  Temp(Src) 99 F (37.2 C) (Core (Comment))  Resp 14  Ht 5\' 6"  (1.676 m)  Wt 86.5 kg (190 lb 11.2 oz)  BMI 30.79 kg/m2  SpO2 99% Vital signs in last 24 hours: Temp:  [98.1 F (36.7 C)-99.1 F (37.3 C)] 99 F (37.2 C) (11/15 0900) Pulse Rate:  [52-103] 68 (11/15 0900) Resp:  [13-28] 14 (11/15 0900) BP: (76-129)/(48-88) 111/71 mmHg (11/15 0900) SpO2:  [97 %-100 %] 99 % (11/15 0900) FiO2 (%):  [40 %] 40 % (11/15 0800) Weight:  [86.5 kg (190 lb 11.2 oz)] 86.5 kg (190 lb 11.2 oz) (11/15 0400)  Intake/Output from previous day: 11/14 0701 - 11/15 0700 In: 1779.8 [I.V.:619.8; NG/GT:750; IV Piggyback:410] Out: 3510 [Urine:3310; Emesis/NG output:200] Intake/Output this shift: Total I/O In: 71.5 [I.V.:31.5; NG/GT:40] Out: 150 [Urine:150] Nutritional status: Diet NPO time specified  Neurologic Exam: Mental Status: Awake.  Follows commands.   Cranial Nerves: II: Discs flat bilaterally; Pupils unequal with the right being enlarged and unreactive.   III,IV, VI: ptosis not present, extra-ocular motions intact bilaterally V,VII: intact corneals bilaterally VIII: hearing normal bilaterally XI: bilateral shoulder shrug Motor: Right : Upper extremity   5/5    Left:     Upper extremity   5/5  Lower extremity   5/5     Lower extremity   5/5 Tone and bulk:normal tone throughout; no atrophy noted Sensory: Pinprick and light touch intact throughout, bilaterally Deep Tendon Reflexes: 2+ and symmetric throughout Plantars: Right: downgoing   Left: downgoing   Lab Results: Basic Metabolic Panel:  Recent Labs Lab 10/23/14 1741 10/24/14 0715 10/27/14 0552 10/27/14 0603  NA 139 138 142 141  K 4.3 4.2 4.0 3.9  CL 104 103 103 109  CO2 19 19 23   --   GLUCOSE 91 176*  115* 116*  BUN 10 15 25* 23  CREATININE 0.64 0.63 0.72 0.80  CALCIUM 9.4 9.2 9.1  --     Liver Function Tests:  Recent Labs Lab 10/24/14 0715 10/27/14 0552  AST 40* 14  ALT 55* 28  ALKPHOS 84 68  BILITOT 0.2* 0.2*  PROT 6.8 6.7  ALBUMIN 3.5 3.5    Recent Labs Lab 10/27/14 0552  LIPASE 10*   No results for input(s): AMMONIA in the last 168 hours.  CBC:  Recent Labs Lab 10/23/14 1741 10/24/14 0715 10/27/14 0552 10/27/14 0603  WBC 7.7 6.0 12.4*  --   NEUTROABS 5.2  --  9.1*  --   HGB 12.0 11.7* 11.5* 11.6*  HCT 35.0* 34.2* 34.1* 34.0*  MCV 93.8 93.4 96.1  --   PLT 268 306 320  --     Cardiac Enzymes:  Recent Labs Lab 10/27/14 0552 10/27/14 1315 10/27/14 1840  CKTOTAL 41  --   --   TROPONINI  --  0.68* <0.30    Lipid Panel: No results for input(s): CHOL, TRIG, HDL, CHOLHDL, VLDL, LDLCALC in the last 168 hours.  CBG:  Recent Labs Lab 10/27/14 0606 10/27/14 1635 10/27/14 2006 10/27/14 2340 10/28/14 0408  GLUCAP 114* 154* 180* 166* 159*    Microbiology: Results for orders placed or performed during the hospital encounter of 10/27/14  MRSA PCR Screening     Status: None   Collection Time: 10/27/14  8:54 AM  Result  Value Ref Range Status   MRSA by PCR NEGATIVE NEGATIVE Final    Comment:        The GeneXpert MRSA Assay (FDA approved for NASAL specimens only), is one component of a comprehensive MRSA colonization surveillance program. It is not intended to diagnose MRSA infection nor to guide or monitor treatment for MRSA infections.     Coagulation Studies:  Recent Labs  10/27/14 0552  LABPROT 14.2  INR 1.09    Imaging: Ct Head Wo Contrast  10/27/2014   CLINICAL DATA:  Seizure disorder, patient unable to provide additional information.  EXAM: CT HEAD WITHOUT CONTRAST  TECHNIQUE: Contiguous axial images were obtained from the base of the skull through the vertex without intravenous contrast.  COMPARISON:  MRI of the brain  October 24, 2014 and CT of the head October 23, 2014  FINDINGS: Moderately motion degraded examination required Re imaging with mild image quality improvement.  The ventricles and sulci are normal. No intraparenchymal hemorrhage, mass effect nor midline shift. No acute large vascular territory infarcts.  No abnormal extra-axial fluid collections. Basal cisterns are patent.  No skull fracture. The included ocular globes and orbital contents are non-suspicious. The mastoid aircells and included paranasal sinuses are well-aerated. Patient is intubated severe RIGHT knee areas.  IMPRESSION: Motion degraded examination without definite acute intracranial process.   Electronically Signed   By: Awilda Metroourtnay  Bloomer   On: 10/27/2014 05:56   Ct Cervical Spine Wo Contrast  10/27/2014   CLINICAL DATA:  Seizures, hit left side of the head  EXAM: CT CERVICAL SPINE WITHOUT CONTRAST  TECHNIQUE: Multidetector CT imaging of the cervical spine was performed without intravenous contrast. Multiplanar CT image reconstructions were also generated.  COMPARISON:  None.  FINDINGS: The alignment is anatomic. The vertebral body heights are maintained. There is no acute fracture. There is no static listhesis. The prevertebral soft tissues are normal. The intraspinal soft tissues are not fully imaged on this examination due to poor soft tissue contrast, but there is no gross soft tissue abnormality.  The disc spaces are relatively well maintained. There is a small central disc protrusion at C4-5. There is a mild broad-based disc osteophyte complex at C5-6 and C6-7. There are bilateral uncovertebral degenerative changes at C5-6 resulting in foraminal encroachment bilaterally. There is bilateral uncovertebral degenerative changes C6-7 with mild bilateral foraminal encroachment.  The visualized portions of the lung apices demonstrate no focal abnormality.  Endotracheal tube with the distal tip not visualized. Orogastric tube noted.  IMPRESSION:  1. No acute osseous injury of the cervical spine. 2. Cervical spine spondylosis as described above.   Electronically Signed   By: Elige KoHetal  Patel   On: 10/27/2014 07:03   Dg Chest Port 1 View  10/27/2014   CLINICAL DATA:  Seizures  EXAM: PORTABLE CHEST - 1 VIEW  COMPARISON:  09/01/2014  FINDINGS: Endotracheal tube with the tip 3 cm above the carina. Left-sided Port-A-Cath in satisfactory position. There is no focal parenchymal opacity, pleural effusion, or pneumothorax. The heart and mediastinal contours are unremarkable. The osseous structures are unremarkable.  IMPRESSION: 1. No active cardiopulmonary disease. 2. Endotracheal tube with the tip 3 cm above the carina.   Electronically Signed   By: Elige KoHetal  Patel   On: 10/27/2014 06:15    Medications:  I have reviewed the patient's current medications. Scheduled: . antiseptic oral rinse  7 mL Mouth Rinse QID  . aspirin  325 mg Oral Daily  . chlorhexidine  15 mL Mouth Rinse BID  .  feeding supplement (PRO-STAT SUGAR FREE 64)  30 mL Per Tube TID  . gabapentin  300 mg Oral 3 times per day  . heparin  5,000 Units Subcutaneous 3 times per day  . hydrocortisone sodium succinate  50 mg Intravenous Q6H  . insulin aspart  1-3 Units Subcutaneous 6 times per day  . levETIRAcetam  1,500 mg Intravenous Q12H  . pantoprazole (PROTONIX) IV  40 mg Intravenous Daily  . sertraline  50 mg Oral Daily  . sulfamethoxazole-trimethoprim  1 tablet Oral QODAY  . topiramate  100 mg Per NG tube BID  . valproate sodium  500 mg Intravenous 3 times per day    Assessment/Plan: No further seizure-like activity noted.  Patient back on home medications.  Has had complaints of chest pain as well that have resolved.    Recommendations: 1.  Will continue current AED therapy and change to po once patient extubated.     LOS: 1 day   Thana FarrLeslie Alonte Wulff, MD [facility] [phone] 10/28/2014  9:16 AM

## 2014-10-28 NOTE — Progress Notes (Signed)
Patient complaining of 7/10 headache. VS stable. Pt asking for pain medication. Dr. Kendrick FriesMcQuaid paged, telephone orders received.

## 2014-10-28 NOTE — Progress Notes (Signed)
Versed 15 mg in 15 ml wasted, in addition to, Fentanyl 900 mcg in 90 ml wasted and witnessed by M. Howdeshell, RN.

## 2014-10-28 NOTE — Plan of Care (Signed)
Problem: Phase I Progression Outcomes Goal: VTE prophylaxis Outcome: Progressing Goal: GIProphysixis Outcome: Progressing Goal: HOB elevated 30 degrees Outcome: Progressing Goal: VAP prevention protocol initiated Outcome: Progressing Goal: Sedation Protocol initiated if indicated Outcome: Progressing Goal: Pain controlled with appropriate interventions Outcome: Progressing Goal: Hemodynamically stable Outcome: Progressing

## 2014-10-28 NOTE — Progress Notes (Signed)
NURSING PROGRESS NOTE  Brenda MannanWanda Mcguire [id number] Transfer Data: 10/28/2014 4:33 PM Attending Provider: Oretha Milchakesh Alva V, MD QIH:[id number]:BAITY, REGINA, NP Code Status: FULL  Brenda MannanWanda Mcguire is a 53 y.o. female patient transferred from 46M -No acute distress noted.  -No complaints of shortness of breath.  -No complaints of chest pain.   Cardiac Monitoring: Box # [id number] in place. Cardiac monitor yields:normal sinus rhythm.  Blood pressure 131/80, pulse 82, temperature 99.1 F (37.3 C), temperature source Oral, resp. rate 18, height 5\' 6"  (1.676 m), weight 86.5 kg (190 lb 11.2 oz), SpO2 97 %.   IV Fluids:  Left chest port-a-cath infusing  Allergies:  Hepatitis b vaccine; Imitrex; Iohexol; Codeine; and Keppra xr  Past Medical History:   has a past medical history of GERD (gastroesophageal reflux disease); Hyperlipidemia; History of blood transfusion; Chronic headaches; Phlebitis; History of colonic polyps; Behcet's disease; Tonic clonic seizures; Anxiety; and Kidney stones ([id number](1999,2001).  Past Surgical History:   has past surgical history that includes Cholecystectomy; Tonsillectomy (1970); Appendectomy (1975); Lumbar fusion (1999); Abdominal hysterectomy (1988); Portacath placement (Left, 2008); Stone extraction with basket; Back surgery; Adenoidectomy (at 53 years old); Oophorectomy (1987); and Radiology with anesthesia (N/A, 10/24/2014).  Social History:   reports that she has never smoked. She has never used smokeless tobacco. She reports that she does not drink alcohol or use illicit drugs.  Skin: abrasion on forehead  Patient/Family orientated to room. Information packet given to patient/family. Admission inpatient armband information verified with patient/family to include name and date of birth and placed on patient arm. Side rails up x 2, fall assessment and education completed with patient/family. Patient/family able to verbalize understanding of risk associated with falls and verbalized  understanding to call for assistance before getting out of bed. Call light within reach. Patient/family able to voice and demonstrate understanding of unit orientation instructions.    Will continue to evaluate and treat per MD orders.

## 2014-10-28 NOTE — Plan of Care (Signed)
Problem: Phase I Progression Outcomes Goal: Seizure activity controlled Outcome: Progressing Goal: IV access obtained Outcome: Progressing Goal: Maintaining airway and VS stable Outcome: Progressing Goal: Pain controlled with appropriate interventions Outcome: Progressing Goal: Hemodynamically stable Outcome: Progressing

## 2014-10-28 NOTE — Progress Notes (Signed)
[facility] PCCM  Patient requesting to leave I advised that she should stay for now, she is OK with staying No neck pain or tenderness on exam, remove C-Collar D/c foley Out of bed Regular diet  Transfer to floor, TRH service, PCCM off  Heber CarolinaBrent McQuaid, MD  PCCM Pager: [phone] Cell: [phone] If no response, call [phone]

## 2014-10-28 NOTE — Progress Notes (Signed)
PULMONARY / CRITICAL CARE MEDICINE   Name: Brenda Mcguire MRN: [id number] DOB: Aug 07, 1961    ADMISSION DATE:  10/27/2014 CONSULTATION DATE:  10/27/2014  REFERRING MD :  Hyacinth Meeker EDP  CHIEF COMPLAINT:  Seizure  INITIAL PRESENTATION: 53 y/o female with multiple medical problems including seizure disorder was admitted from [facility] ED on 11/14 for seizure activity.  STUDIES:  11/14 CT head > motion degraded exam, no AICP 11/14 CT c-spine > No acute osseous injury of c-spine; some spondylosis 11/14 EEG > no seizure activity  SIGNIFICANT EVENTS: 11/15 extubated  SUBJECTIVE:  Nurse noted convulsions on 11/14 with lower dose of benzodiazepine, but would cough in the middle of convulsions with deep suction from ballard; eeg negative  VITAL SIGNS: Temp:  [98.1 F (36.7 C)-99.1 F (37.3 C)] 99 F (37.2 C) (11/15 0900) Pulse Rate:  [52-103] 68 (11/15 0900) Resp:  [13-28] 14 (11/15 0900) BP: (76-129)/(48-88) 111/71 mmHg (11/15 0900) SpO2:  [97 %-100 %] 99 % (11/15 0900) FiO2 (%):  [40 %] 40 % (11/15 0800) Weight:  [86.5 kg (190 lb 11.2 oz)] 86.5 kg (190 lb 11.2 oz) (11/15 0400) HEMODYNAMICS:   VENTILATOR SETTINGS: Vent Mode:  [-] PSV FiO2 (%):  [40 %] 40 % Set Rate:  [14 bmp] 14 bmp Vt Set:  [480 mL] 480 mL PEEP:  [5 cmH20] 5 cmH20 Pressure Support:  [10 cmH20] 10 cmH20 Plateau Pressure:  [10 cmH20-16 cmH20] 16 cmH20 INTAKE / OUTPUT:  Intake/Output Summary (Last 24 hours) at 10/28/14 0938 Last data filed at 10/28/14 0800  Gross per 24 hour  Intake 1838.27 ml  Output   3335 ml  Net -1496.73 ml    PHYSICAL EXAMINATION: General:  Awake on vent Neuro:  Awake on vent, follows commands, nods head appropriately, moves all four ext HEENT:  Neck collar in place right pupil dilated and unreactive to light, left normal size and reactive to light  Cardiovascular:  RRR, no murmurs gallops or rubs Lungs:  Clear to auscultation bilaterally, endotracheal tube in place Abdomen:  BS+, soft,  nontender Musculoskeletal:  Normal bulk and tone Skin:  Tattoos noted, no obvious skin breakdown or rash  LABS:  CBC  Recent Labs Lab 10/23/14 1741 10/24/14 0715 10/27/14 0552 10/27/14 0603  WBC 7.7 6.0 12.4*  --   HGB 12.0 11.7* 11.5* 11.6*  HCT 35.0* 34.2* 34.1* 34.0*  PLT 268 306 320  --    Coag's  Recent Labs Lab 10/27/14 0552  INR 1.09   BMET  Recent Labs Lab 10/23/14 1741 10/24/14 0715 10/27/14 0552 10/27/14 0603  NA 139 138 142 141  K 4.3 4.2 4.0 3.9  CL 104 103 103 109  CO2 19 19 23   --   BUN 10 15 25* 23  CREATININE 0.64 0.63 0.72 0.80  GLUCOSE 91 176* 115* 116*   Electrolytes  Recent Labs Lab 10/23/14 1741 10/24/14 0715 10/27/14 0552  CALCIUM 9.4 9.2 9.1   Sepsis Markers  Recent Labs Lab 10/27/14 0602  LATICACIDVEN 2.27*   ABG  Recent Labs Lab 10/27/14 0716  PHART 7.456*  PCO2ART 34.3*  PO2ART 331.0*   Liver Enzymes  Recent Labs Lab 10/24/14 0715 10/27/14 0552  AST 40* 14  ALT 55* 28  ALKPHOS 84 68  BILITOT 0.2* 0.2*  ALBUMIN 3.5 3.5   Cardiac Enzymes  Recent Labs Lab 10/27/14 1315 10/27/14 1840  TROPONINI 0.68* <0.30   Glucose  Recent Labs Lab 10/26/14 0814 10/27/14 0606 10/27/14 1635 10/27/14 2006 10/27/14 2340 10/28/14 0408  GLUCAP 207* 114* 154* 180* 166* 159*    Imaging Ct Head Wo Contrast  10/27/2014   CLINICAL DATA:  Seizure disorder, patient unable to provide additional information.  EXAM: CT HEAD WITHOUT CONTRAST  TECHNIQUE: Contiguous axial images were obtained from the base of the skull through the vertex without intravenous contrast.  COMPARISON:  MRI of the brain October 24, 2014 and CT of the head October 23, 2014  FINDINGS: Moderately motion degraded examination required Re imaging with mild image quality improvement.  The ventricles and sulci are normal. No intraparenchymal hemorrhage, mass effect nor midline shift. No acute large vascular territory infarcts.  No abnormal extra-axial  fluid collections. Basal cisterns are patent.  No skull fracture. The included ocular globes and orbital contents are non-suspicious. The mastoid aircells and included paranasal sinuses are well-aerated. Patient is intubated severe RIGHT knee areas.  IMPRESSION: Motion degraded examination without definite acute intracranial process.   Electronically Signed   By: Awilda Metroourtnay  Bloomer   On: 10/27/2014 05:56   Ct Cervical Spine Wo Contrast  10/27/2014   CLINICAL DATA:  Seizures, hit left side of the head  EXAM: CT CERVICAL SPINE WITHOUT CONTRAST  TECHNIQUE: Multidetector CT imaging of the cervical spine was performed without intravenous contrast. Multiplanar CT image reconstructions were also generated.  COMPARISON:  None.  FINDINGS: The alignment is anatomic. The vertebral body heights are maintained. There is no acute fracture. There is no static listhesis. The prevertebral soft tissues are normal. The intraspinal soft tissues are not fully imaged on this examination due to poor soft tissue contrast, but there is no gross soft tissue abnormality.  The disc spaces are relatively well maintained. There is a small central disc protrusion at C4-5. There is a mild broad-based disc osteophyte complex at C5-6 and C6-7. There are bilateral uncovertebral degenerative changes at C5-6 resulting in foraminal encroachment bilaterally. There is bilateral uncovertebral degenerative changes C6-7 with mild bilateral foraminal encroachment.  The visualized portions of the lung apices demonstrate no focal abnormality.  Endotracheal tube with the distal tip not visualized. Orogastric tube noted.  IMPRESSION: 1. No acute osseous injury of the cervical spine. 2. Cervical spine spondylosis as described above.   Electronically Signed   By: Elige KoHetal  Patel   On: 10/27/2014 07:03   Dg Chest Port 1 View  10/27/2014   CLINICAL DATA:  Seizures  EXAM: PORTABLE CHEST - 1 VIEW  COMPARISON:  09/01/2014  FINDINGS: Endotracheal tube with the tip 3  cm above the carina. Left-sided Port-A-Cath in satisfactory position. There is no focal parenchymal opacity, pleural effusion, or pneumothorax. The heart and mediastinal contours are unremarkable. The osseous structures are unremarkable.  IMPRESSION: 1. No active cardiopulmonary disease. 2. Endotracheal tube with the tip 3 cm above the carina.   Electronically Signed   By: Elige KoHetal  Patel   On: 10/27/2014 06:15     ASSESSMENT / PLAN:  PULMONARY OETT November 14> 11/15 A: Acute respiratory failure with hypercarbia due to inability to protect airway P:   -extubate -aspiration precautions  CARDIOVASCULAR CVL not indicated A: Sinus bradycardia due to propofol > resolved Hypotension due to propofol, and possible relative adrenal insufficiency> resolved NSTEMI > likely demand ischemia in setting of hypotension as no EKG changes, troponin repeat normal  P:  -hydrocortisone 50 mg every 6 hours, wean 11/16 -asa daily  RENAL A:  No acute issues P:   -Monitor BMET and UOP -Replace electrolytes as needed   GASTROINTESTINAL A:  No acute issues P:   -  OG-tube -Pantoprazole for stress ulcer prophylaxis -let her eat post extubation  HEMATOLOGIC A:  No acute issues P:  -intermittent CBC  INFECTIOUS A:  Leukocytosis due to recent steroid administration, no sign of infection P:   Monitor for fever  ENDOCRINE A:  Likely relative adrenal insufficiency considering recent steroid use P:   -hydrocortisone 50 mg IV every 6 hours  NEUROLOGIC A:  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 Unclear if this is actual seizure activity or if she has had psychogenic  convulsive activity. Treatment and work up per neurology. Plan extubation today.     CC time by me 35 minutes  Heber CarolinaBrent McQuaid, MD [place] PCCM Pager: [phone] Cell: [phone] If no response, call [phone]  10/28/2014, 9:38 AM

## 2014-10-28 NOTE — Progress Notes (Signed)
Report received for transfer to [phone]

## 2014-10-29 ENCOUNTER — Encounter: Payer: Self-pay | Admitting: Internal Medicine

## 2014-10-29 DIAGNOSIS — J9601 Acute respiratory failure with hypoxia: Secondary | ICD-10-CM

## 2014-10-29 LAB — CBC WITH DIFFERENTIAL/PLATELET
BASOS ABS: 0 10*3/uL (ref 0.0–0.1)
Basophils Relative: 0 % (ref 0–1)
Eosinophils Absolute: 0.1 10*3/uL (ref 0.0–0.7)
Eosinophils Relative: 1 % (ref 0–5)
HCT: 33.5 % — ABNORMAL LOW (ref 36.0–46.0)
Hemoglobin: 11.4 g/dL — ABNORMAL LOW (ref 12.0–15.0)
LYMPHS ABS: 0.9 10*3/uL (ref 0.7–4.0)
LYMPHS PCT: 19 % (ref 12–46)
MCH: 32.2 pg (ref 26.0–34.0)
MCHC: 34 g/dL (ref 30.0–36.0)
MCV: 94.6 fL (ref 78.0–100.0)
Monocytes Absolute: 0.3 10*3/uL (ref 0.1–1.0)
Monocytes Relative: 5 % (ref 3–12)
NEUTROS ABS: 3.8 10*3/uL (ref 1.7–7.7)
Neutrophils Relative %: 75 % (ref 43–77)
PLATELETS: 221 10*3/uL (ref 150–400)
RBC: 3.54 MIL/uL — ABNORMAL LOW (ref 3.87–5.11)
RDW: 12.2 % (ref 11.5–15.5)
WBC: 5 10*3/uL (ref 4.0–10.5)

## 2014-10-29 LAB — BASIC METABOLIC PANEL
ANION GAP: 14 (ref 5–15)
BUN: 20 mg/dL (ref 6–23)
CO2: 23 mEq/L (ref 19–32)
Calcium: 8.3 mg/dL — ABNORMAL LOW (ref 8.4–10.5)
Chloride: 104 mEq/L (ref 96–112)
Creatinine, Ser: 0.68 mg/dL (ref 0.50–1.10)
GFR calc Af Amer: >90 mL/min (ref >90)
Glucose, Bld: 125 mg/dL — ABNORMAL HIGH (ref 70–99)
Potassium: 3.3 mEq/L — ABNORMAL LOW (ref 3.7–5.3)
SODIUM: 141 meq/L (ref 137–147)

## 2014-10-29 LAB — GLUCOSE, CAPILLARY
GLUCOSE-CAPILLARY: 127 mg/dL — AB (ref 70–99)
GLUCOSE-CAPILLARY: 138 mg/dL — AB (ref 70–99)
Glucose-Capillary: 140 mg/dL — ABNORMAL HIGH (ref 70–99)

## 2014-10-29 MED ORDER — PREDNISONE 20 MG PO TABS: 60.0000 mg | ORAL_TABLET | Freq: Two times a day (BID) | ORAL | Status: DC

## 2014-10-29 MED ORDER — DIVALPROEX SODIUM 500 MG PO DR TAB
500.0000 mg | DELAYED_RELEASE_TABLET | Freq: Three times a day (TID) | ORAL | Status: DC
Start: 2014-10-29 — End: 2014-10-29
  Administered 2014-10-29: 500 mg via ORAL
  Filled 2014-10-29 (×3): qty 1

## 2014-10-29 MED ORDER — HEPARIN SOD (PORK) LOCK FLUSH 100 UNIT/ML IV SOLN
500.0000 [IU] | INTRAVENOUS | Status: AC | PRN
  Administered 2014-10-29: 500 [IU]

## 2014-10-29 MED ORDER — PREDNISONE 50 MG PO TABS
60.0000 mg | ORAL_TABLET | Freq: Two times a day (BID) | ORAL | Status: DC
  Filled 2014-10-29 (×2): qty 1

## 2014-10-29 NOTE — Progress Notes (Signed)
Subjective: Feels much better, wants to go home  Exam: Filed Vitals:   10/29/14 0500  BP: 124/87  Pulse: 62  Temp: 98 F (36.7 C)  Resp: 18   Gen: In bed, NAD MS: Awake, alert, interactive and appropriate WU:[id number]:Right pupil is dilated and non-reactive, EOMI,  Motor: MAEW, no focal weakenss Sensory:intact to LT  Impression: 53 yo F with dilated pupil recently evaluated by ophthalmology on previous admission and felt to most likely be pharmacologically dilated pupil. She has had multiple hospitalizations for seizure-like episodes consisting of head turn with posturing with negative EEGs during these episodes. I continue to suspect psychogenic nature to these seizures, but the presence of concomittant epilepsy is difficult to exclude. If her pupil is indeed pharmacologically dilated, which ? 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   No contraindication to discharge at this time from neuro perspective.   Recommendations: 1) AEDs at home dose.  2) f/u with  Outpatient neurologist.  3) f/u with ophthalmology as outpatient.   Ritta SlotMcNeill Jovanka Westgate, MD [facility] [phone]  If 7pm- 7am, please page neurology on call as listed in AMION.

## 2014-10-29 NOTE — Care Management Note (Unsigned)
    Page 1 of 1   10/29/2014     11:48:15 AM CARE MANAGEMENT NOTE 10/29/2014  Patient:  Brenda Mcguire,Brenda Mcguire   Account Number:  [id number]  Date Initiated:  10/29/2014  Documentation initiated by:  Laverna PeaceMcNally,Kristin  Subjective/Objective Assessment:   pt admit with seizures     Action/Plan:   pt admit from home and anticipated dc home   Anticipated DC Date:  10/29/2014   Anticipated DC Plan:  HOME/SELF CARE         Choice offered to / List presented to:             Status of service:   Medicare Important Message given?   (If response is "NO", the following Medicare IM given date fields will be blank) Date Medicare IM given:   Medicare IM given by:   Date Additional Medicare IM given:   Additional Medicare IM given by:    Discharge Disposition:  HOME/SELF CARE  Per UR Regulation:    If discussed at Long Length of Stay Meetings, dates discussed:    Comments:  10/29/14 kristin mcnally Pt for dc home today. No needs noted.

## 2014-10-29 NOTE — Progress Notes (Signed)
Utilization review complete. Keylor Rands RN CCM Case Mgmt phone [phone] 
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
## 2014-10-29 NOTE — Discharge Summary (Addendum)
Physician Discharge Summary  Brenda MannanWanda Mcguire ZOX:[id number] DOB: [birth year] DOA: 10/27/2014  PCP: Nicki ReaperBAITY, REGINA, NP  Admit date: 10/27/2014 Discharge date: 10/29/2014  Recommendations for Outpatient Follow-up:  1. Pt will need to follow up with PCP in 2 weeks post discharge 2. Please obtain BMP, hepatic panel, and CBC in 1 week   Discharge Diagnoses:  Acute respiratory failure -The patient was intubated at the time of admission due to her inability to protect her airway -She was managed by critical care medicine -She was subsequently extubated on 10/28/2014 and transferred to the medical floor -Because the patient was noted to be hypotensive, the patient was started on stress steroids -The patient is presently stable on room air Seizure disorder -There is still concern whether the patient is truly having seizures versus nonepileptic form seizure-like activity -during the hospitalization, the patient did have seizure activity; however, one hooked up to EEG there was no epileptiform activity suggesting that the patient may have had nonepileptic seizure type activity -Neurology was consulted to see the patient and adjusted the patient's antiepileptic medications during the hospitalization -The patient did not have any further seizure activity for a period of 48 hours prior to discharge -The case was discussed with neurology, Dr. Amada JupiterKirkpatrick on the day of discharge who cleared the patient for discharge from a neurologic standpoint -He recommended that the patient be restarted on her outpatient doses of antiepileptic medications and to follow up with her epileptologist, Dr. Patrcia DollyKaren Mcguire Hypotension -The patient likely had a degree of relative adrenal insufficiency due to her stress -On the day of discharge, the patient was adamant upon being discharged from the hospital -The patient was afebrile and hemodynamically stable -I discussed with the patient the possibility of staying another day  which she adamantly refused -Therefore, I will have the patient wean her steroids in the outpatient setting -She will take prednisone 60 mg twice a day 2 days, 40 mg twice a day 2 days, 20 mg twice a day 2 days, 20 mg daily 2 days, then back to her usual home dose Vision loss -During her previous admission the patient had given history of vision loss, but there was concern that this may been self-induced. The patient will need to follow-up with ophthalmology, Dr. Randon GoldsmithLyles in the outpatient setting -Given her history of Behcet disease, the patient will continue on her dose of prednisone 10 mg daily after she weans down from the prednisone taper discussed above -during this hospitalization the patient did not complain of any visual changes -On the day of discharge the patient denied any visual loss or worsening of her vision Behcet's disease -the patient needs to follow up with her primary rheumatologist, Dr. Kellie Simmeringruslow Elevated troponin -This was thought to be due to demand ischemia -Patient had normal EKG without any chest pain -Repeat troponins returned back to normal -No further workup was indicated Discharge Condition: stable  Disposition: home Follow-up Information    Follow up with Mcguire, REGINA, NP In 1 week.   Specialty:  Internal Medicine   Contact information:   [address] [place] [address] [id number]       Follow up with Antony ContrasLYLES, GRAHAM, MD In 1 week.   Specialty:  Ophthalmology   Contact information:   [address] [place] [address] [phone]       Follow up with Brenda Mcguire,Brenda M, MD In 2 weeks.   Specialty:  Neurology   Contact information:   [address] [place]  [address] [phone]       Diet:regluar Wt Readings from Last 3 Encounters:  10/29/14 82.555 kg (182 lb)  10/23/14 84.5 kg (186 lb 4.6 oz)  09/02/14 82.2 kg (181 lb 3.5 oz)    History of present illness:   53 y/o female with a past medical history  significant for Behcet's disease and seizure disorder who was just discharged from our facility for possible optic neuritis who was brought back by EMS on 11/14 AM for seizure activity.history was obtained by discussing the case with the ER staff as well as reviewing the chart as the patient was intubated when I was present. By history it sounds like she called her husband and reported that she "felt like she was going to have a seizure". EMS was called and she was witnessed having a tonic-clonic seizure. EMS administered multiple rounds of benzodiazepines without good result. In the emergency department she continued to have seizure activity. She was intubated for airway protection, placed on a propofol infusion, and given Dilantin. A CT of the cervical spine and head was negative.    Consultants: [facility] Neurology  Discharge Exam: Filed Vitals:   10/29/14 0500  BP: 124/87  Pulse: 62  Temp: 98 F (36.7 C)  Resp: 18   Filed Vitals:   10/28/14 1400 10/28/14 1622 10/28/14 2100 10/29/14 0500  BP: 124/74 131/80 112/68 124/87  Pulse:  82 85 62  Temp:  99.1 F (37.3 C) 98.3 F (36.8 C) 98 F (36.7 C)  TempSrc:  Oral    Resp: 11 18 12 18   Height:      Weight:    82.555 kg (182 lb)  SpO2: 98% 97% 93% 100%   General: A&O x 3, NAD, pleasant, cooperative Cardiovascular: RRR, no rub, no gallop, no S3 Respiratory: CTAB, no wheeze, no rhonchi Abdomen:soft, nontender, nondistended, positive bowel sounds Extremities: No edema, No lymphangitis, no petechiae  Discharge Instructions      Discharge Instructions    Diet - low sodium heart healthy    Complete by:  As directed      Increase activity slowly    Complete by:  As directed             Medication List    STOP taking these medications        sertraline 50 MG tablet  Commonly known as:  ZOLOFT      TAKE these medications        ALPRAZolam 0.5 MG tablet  Commonly known as:  XANAX  Take 1 tablet (0.5 mg total) by mouth 3  (three) times daily as needed for anxiety.     amitriptyline 25 MG tablet  Commonly known as:  ELAVIL  TAKE 1 TABLET (25 MG TOTAL) BY MOUTH AT BEDTIME.     atorvastatin 10 MG tablet  Commonly known as:  LIPITOR  Take 1 tablet (10 mg total) by mouth daily.     BACTRIM DS 800-160 MG per tablet  Generic drug:  sulfamethoxazole-trimethoprim  Take 1 tablet by mouth every other day. Take every day because she is on Chemo Per patient     cyclophosphamide 25 MG tablet  Commonly known as:  CYTOXAN  Take 75 mg by mouth daily. Give on an empty stomach 1 hour before or 2 hours after meals.     divalproex 250 MG DR tablet  Commonly known as:  DEPAKOTE  Take 2 tablets (500 mg total) by mouth 3 (three) times daily.     gabapentin  300 MG capsule  Commonly known as:  NEURONTIN  Take 3 capsules (900 mg total) by mouth 3 (three) times daily.     HYDROcodone-acetaminophen 5-325 MG per tablet  Commonly known as:  NORCO/VICODIN  Take 1 tablet by mouth 3 (three) times daily as needed for moderate pain.     levETIRAcetam 500 MG tablet  Commonly known as:  KEPPRA  Take 3 tablets (1,500 mg total) by mouth 2 (two) times daily.     predniSONE 10 MG tablet  Commonly known as:  DELTASONE  Take 10 mg by mouth daily with breakfast.     predniSONE 20 MG tablet  Commonly known as:  DELTASONE  Take 3 tablets (60 mg total) by mouth 2 (two) times daily with a meal. X 2 days. Then 2 tabs (40mg ) two times a day x  2 Days, then 1 tab (20mg ) two times a day x 2 days, then 20mg  daily x 2 days, then return to your 10mg  once daily pill     promethazine 50 MG tablet  Commonly known as:  PHENERGAN  Take 0.5 tablets (25 mg total) by mouth every 6 (six) hours as needed for nausea or vomiting.     topiramate 100 MG tablet  Commonly known as:  TOPAMAX  Take 100 mg by mouth daily.     zolpidem 10 MG tablet  Commonly known as:  AMBIEN  Take 1 tablet (10 mg total) by mouth at bedtime as needed for sleep.          The results of significant diagnostics from this hospitalization (including imaging, microbiology, ancillary and laboratory) are listed below for reference.    Significant Diagnostic Studies: Ct Head Wo Contrast  10/27/2014   CLINICAL DATA:  Seizure disorder, patient unable to provide additional information.  EXAM: CT HEAD WITHOUT CONTRAST  TECHNIQUE: Contiguous axial images were obtained from the base of the skull through the vertex without intravenous contrast.  COMPARISON:  MRI of the brain October 24, 2014 and CT of the head October 23, 2014  FINDINGS: Moderately motion degraded examination required Re imaging with mild image quality improvement.  The ventricles and sulci are normal. No intraparenchymal hemorrhage, mass effect nor midline shift. No acute large vascular territory infarcts.  No abnormal extra-axial fluid collections. Basal cisterns are patent.  No skull fracture. The included ocular globes and orbital contents are non-suspicious. The mastoid aircells and included paranasal sinuses are well-aerated. Patient is intubated severe RIGHT knee areas.  IMPRESSION: Motion degraded examination without definite acute intracranial process.   Electronically Signed   By: Awilda Metro   On: 10/27/2014 05:56   Ct Head Wo Contrast  10/23/2014   CLINICAL DATA:  Five day history of headache. Intermittent blurred vision. Unequal pupils. Patient with Behcet's disease  EXAM: CT HEAD WITHOUT CONTRAST  TECHNIQUE: Contiguous axial images were obtained from the base of the skull through the vertex without intravenous contrast.  COMPARISON:  August 31, 2014  FINDINGS: The ventricles are normal in size and configuration. There is no mass, hemorrhage, extra-axial fluid collection, or midline shift. Gray-white compartments are normal. There is no demonstrable acute infarct. Bony calvarium appears intact. The mastoid air cells are clear.  IMPRESSION: Study within normal limits. No intracranial mass,  hemorrhage, or focal gray -white compartment lesions/acute appearing infarct.   Electronically Signed   By: Bretta Bang M.D.   On: 10/23/2014 15:08   Ct Cervical Spine Wo Contrast  10/27/2014   CLINICAL DATA:  Seizures, hit  left side of the head  EXAM: CT CERVICAL SPINE WITHOUT CONTRAST  TECHNIQUE: Multidetector CT imaging of the cervical spine was performed without intravenous contrast. Multiplanar CT image reconstructions were also generated.  COMPARISON:  None.  FINDINGS: The alignment is anatomic. The vertebral body heights are maintained. There is no acute fracture. There is no static listhesis. The prevertebral soft tissues are normal. The intraspinal soft tissues are not fully imaged on this examination due to poor soft tissue contrast, but there is no gross soft tissue abnormality.  The disc spaces are relatively well maintained. There is a small central disc protrusion at C4-5. There is a mild broad-based disc osteophyte complex at C5-6 and C6-7. There are bilateral uncovertebral degenerative changes at C5-6 resulting in foraminal encroachment bilaterally. There is bilateral uncovertebral degenerative changes C6-7 with mild bilateral foraminal encroachment.  The visualized portions of the lung apices demonstrate no focal abnormality.  Endotracheal tube with the distal tip not visualized. Orogastric tube noted.  IMPRESSION: 1. No acute osseous injury of the cervical spine. 2. Cervical spine spondylosis as described above.   Electronically Signed   By: Elige Ko   On: 10/27/2014 07:03   Mr Maxine Glenn Head Wo Contrast  10/24/2014   CLINICAL DATA:  Personal history of a Behcet's syndrome with sudden transient loss of vision in the right eye. Vision the right eye remains blurry. P biliary dilation scratch the the right pupil is dilated. During the course in the emergency department patient developed right-sided weakness in the upper and lower extremity.  EXAM: MRI HEAD WITHOUT CONTRAST  MRA HEAD  WITHOUT CONTRAST  MRV HEAD WITHOUT CONTRAST  TECHNIQUE: Multiplanar, multiecho pulse sequences of the brain and surrounding structures were obtained without intravenous contrast. Angiographic images of the head were obtained using MRA technique without contrast. Angiographic images of the intracranial venous structures were obtained using MRV technique without intravenous contrast.  COMPARISON:  CT head without contrast 10/23/2014.  FINDINGS: MRI HEAD FINDINGS  The diffusion-weighted images demonstrate no evidence for acute or subacute infarction. No acute hemorrhage or mass lesion is evident. Scattered subcortical T2 hyperintensities in the anterior frontal lobes bilaterally are slightly greater than expected for age no confluent white matter changes are evident. Flow is present in the major intracranial arteries. The globes and orbits are intact. There is symmetric size and signal of the optic nerves bilaterally. The extraocular muscles are within normal limits.  The paranasal sinuses and mastoid air cells are clear. Flow is present in the major intracranial arteries. Midline structures are within normal limits.  MRA HEAD FINDINGS  The internal carotid arteries are within normal limits from the high cervical segments through the ICA termini bilaterally. The A1 and M1 segments are normal. The MCA bifurcations are within normal limits. ACA and MCA branch vessels are within normal limits.  The left vertebral artery is the dominant vessel. The PICA origins are visualized and normal bilaterally. The basilar artery is normal. Both posterior cerebral arteries originate from the basilar tip. The PCA branch vessels are within normal limits.  MRV HEAD FINDINGS  Major dural sinuses are patent. There is however, very limited flow within the left transverse sinus. The superior sagittal sinus an inferior sagittal sinus is patent. The right transverse sinus is patent. The torcula herophilii is midline suggesting non dominance of  the transverse sinus. The left sigmoid sinus and internal jugular vein are present, the smaller than on the right. The dural veins are patent.  IMPRESSION: 1. Poor visualization of  the left transverse sinus may represent stenosis of the sinus. There is no definite thrombus. 2. Minimal scattered subcortical hyperintensities anteriorly are slightly greater than expected for age. These are not classic for a Behcet's. The finding is nonspecific but can be seen in the setting of chronic microvascular ischemia, a demyelinating process such as multiple sclerosis, vasculitis, complicated migraine headaches, or as the sequelae of a prior infectious or inflammatory process. 3. Otherwise normal MRI of the brain. 4. Normal variant MRA circle of Willis without evidence for significant proximal stenosis, aneurysm, or branch vessel occlusion. 5. Otherwise normal MRV without evidence for focal thrombosis.   Electronically Signed   By: Gennette Pac M.D.   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 Vision the right eye remains blurry. P biliary dilation scratch the the right pupil is dilated. During the course in the emergency department patient developed right-sided weakness in the upper and lower extremity.  EXAM: MRI HEAD WITHOUT CONTRAST  MRA HEAD WITHOUT CONTRAST  MRV HEAD WITHOUT CONTRAST  TECHNIQUE: Multiplanar, multiecho pulse sequences of the brain and surrounding structures were obtained without intravenous contrast. Angiographic images of the head were obtained using MRA technique without contrast. Angiographic images of the intracranial venous structures were obtained using MRV technique without intravenous contrast.  COMPARISON:  CT head without contrast 10/23/2014.  FINDINGS: MRI HEAD FINDINGS  The diffusion-weighted images demonstrate no evidence for acute or subacute infarction. No acute  hemorrhage or mass lesion is evident. Scattered subcortical T2 hyperintensities in the anterior frontal lobes bilaterally are slightly greater than expected for age no confluent white matter changes are evident. Flow is present in the major intracranial arteries. The globes and orbits are intact. There is symmetric size and signal of the optic nerves bilaterally. The extraocular muscles are within normal limits.  The paranasal sinuses and mastoid air cells are clear. Flow is present in the major intracranial arteries. Midline structures are within normal limits.  MRA HEAD FINDINGS  The internal carotid arteries are within normal limits from the high cervical segments through the ICA termini bilaterally. The A1 and M1 segments are normal. The MCA bifurcations are within normal limits. ACA and MCA branch vessels are within normal limits.  The left vertebral artery is the dominant vessel. The PICA origins are visualized and normal bilaterally. The basilar artery is normal. Both posterior cerebral arteries originate from the basilar tip. The PCA branch vessels are within normal limits.  MRV HEAD FINDINGS  Major dural sinuses are patent. There is however, very limited flow within the left transverse sinus. The superior sagittal sinus an inferior sagittal sinus is patent. The right transverse sinus is patent. The torcula herophilii is midline suggesting non dominance of the transverse sinus. The left sigmoid sinus and internal jugular vein are present, the smaller than on the right. The dural veins are patent.  IMPRESSION: 1. Poor visualization of the left transverse sinus may represent stenosis of the sinus. There is no definite thrombus. 2. Minimal scattered subcortical hyperintensities anteriorly are slightly greater than expected for age. These are not classic for a Behcet's. The finding is nonspecific but can be seen in the setting of chronic microvascular ischemia, a demyelinating process such as multiple sclerosis,  vasculitis, complicated migraine headaches, or as the sequelae of a prior infectious or inflammatory process. 3. Otherwise normal MRI of the brain. 4.  Normal variant MRA circle of Willis without evidence for significant proximal stenosis, aneurysm, or branch vessel occlusion. 5. Otherwise normal MRV without evidence for focal thrombosis.   Electronically Signed   By: Gennette Pachris  Mattern M.D.   On: 10/24/2014 13:15   Mr Alexandria LodgeVenogram Head  10/24/2014   CLINICAL DATA:  Personal history of a Behcet's syndrome with sudden transient loss of vision in the right eye. Vision the right eye remains blurry. P biliary dilation scratch the the right pupil is dilated. During the course in the emergency department patient developed right-sided weakness in the upper and lower extremity.  EXAM: MRI HEAD WITHOUT CONTRAST  MRA HEAD WITHOUT CONTRAST  MRV HEAD WITHOUT CONTRAST  TECHNIQUE: Multiplanar, multiecho pulse sequences of the brain and surrounding structures were obtained without intravenous contrast. Angiographic images of the head were obtained using MRA technique without contrast. Angiographic images of the intracranial venous structures were obtained using MRV technique without intravenous contrast.  COMPARISON:  CT head without contrast 10/23/2014.  FINDINGS: MRI HEAD FINDINGS  The diffusion-weighted images demonstrate no evidence for acute or subacute infarction. No acute hemorrhage or mass lesion is evident. Scattered subcortical T2 hyperintensities in the anterior frontal lobes bilaterally are slightly greater than expected for age no confluent white matter changes are evident. Flow is present in the major intracranial arteries. The globes and orbits are intact. There is symmetric size and signal of the optic nerves bilaterally. The extraocular muscles are within normal limits.  The paranasal sinuses and mastoid air cells are clear. Flow is present in the major intracranial arteries. Midline structures are within normal  limits.  MRA HEAD FINDINGS  The internal carotid arteries are within normal limits from the high cervical segments through the ICA termini bilaterally. The A1 and M1 segments are normal. The MCA bifurcations are within normal limits. ACA and MCA branch vessels are within normal limits.  The left vertebral artery is the dominant vessel. The PICA origins are visualized and normal bilaterally. The basilar artery is normal. Both posterior cerebral arteries originate from the basilar tip. The PCA branch vessels are within normal limits.  MRV HEAD FINDINGS  Major dural sinuses are patent. There is however, very limited flow within the left transverse sinus. The superior sagittal sinus an inferior sagittal sinus is patent. The right transverse sinus is patent. The torcula herophilii is midline suggesting non dominance of the transverse sinus. The left sigmoid sinus and internal jugular vein are present, the smaller than on the right. The dural veins are patent.  IMPRESSION: 1. Poor visualization of the left transverse sinus may represent stenosis of the sinus. There is no definite thrombus. 2. Minimal scattered subcortical hyperintensities anteriorly are slightly greater than expected for age. These are not classic for a Behcet's. The finding is nonspecific but can be seen in the setting of chronic microvascular ischemia, a demyelinating process such as multiple sclerosis, vasculitis, complicated migraine headaches, or as the sequelae of a prior infectious or inflammatory process. 3. Otherwise normal MRI of the brain. 4. Normal variant MRA circle of Willis without evidence for significant proximal stenosis, aneurysm, or branch vessel occlusion. 5. Otherwise normal MRV without evidence for focal thrombosis.   Electronically Signed   By: Gennette Pachris  Mattern M.D.   On: 10/24/2014 13:15   Dg Chest Port 1 View  10/27/2014   CLINICAL DATA:  Seizures  EXAM: PORTABLE CHEST - 1 VIEW  COMPARISON:  09/01/2014  FINDINGS: Endotracheal  tube with the tip 3 cm above the carina. Left-sided  Port-A-Cath in satisfactory position. There is no focal parenchymal opacity, pleural effusion, or pneumothorax. The heart and mediastinal contours are unremarkable. The osseous structures are unremarkable.  IMPRESSION: 1. No active cardiopulmonary disease. 2. Endotracheal tube with the tip 3 cm above the carina.   Electronically Signed   By: Elige Ko   On: 10/27/2014 06:15     Microbiology: Recent Results (from the past 240 hour(s))  Urine culture     Status: None   Collection Time: 10/27/14  7:08 AM  Result Value Ref Range Status   Specimen Description URINE, CLEAN CATCH  Final   Special Requests NONE  Final   Culture  Setup Time   Final    10/27/2014 20:50 Performed at [facility]    Colony Count NO GROWTH Performed at [facility]   Final   Culture NO GROWTH Performed at [facility]   Final   Report Status 10/28/2014 FINAL  Final  MRSA PCR Screening     Status: None   Collection Time: 10/27/14  8:54 AM  Result Value Ref Range Status   MRSA by PCR NEGATIVE NEGATIVE Final    Comment:        The GeneXpert MRSA Assay (FDA approved for NASAL specimens only), is one component of a comprehensive MRSA colonization surveillance program. It is not intended to diagnose MRSA infection nor to guide or monitor treatment for MRSA infections.      Labs: Basic Metabolic Panel:  Recent Labs Lab 10/23/14 1741 10/24/14 0715 10/27/14 0552 10/27/14 0603 10/29/14 0800  NA 139 138 142 141 141  K 4.3 4.2 4.0 3.9 3.3*  CL 104 103 103 109 104  CO2 19 19 23   --  23  GLUCOSE 91 176* 115* 116* 125*  BUN 10 15 25* 23 20  CREATININE 0.64 0.63 0.72 0.80 0.68  CALCIUM 9.4 9.2 9.1  --  8.3*   Liver Function Tests:  Recent Labs Lab 10/24/14 0715 10/27/14 0552  AST 40* 14  ALT 55* 28  ALKPHOS 84 68  BILITOT 0.2* 0.2*  PROT 6.8 6.7  ALBUMIN 3.5 3.5    Recent Labs Lab 10/27/14 0552  LIPASE 10*    No results for input(s): AMMONIA in the last 168 hours. CBC:  Recent Labs Lab 10/23/14 1741 10/24/14 0715 10/27/14 0552 10/27/14 0603 10/29/14 0800  WBC 7.7 6.0 12.4*  --  5.0  NEUTROABS 5.2  --  9.1*  --  3.8  HGB 12.0 11.7* 11.5* 11.6* 11.4*  HCT 35.0* 34.2* 34.1* 34.0* 33.5*  MCV 93.8 93.4 96.1  --  94.6  PLT 268 306 320  --  221   Cardiac Enzymes:  Recent Labs Lab 10/27/14 0552 10/27/14 1315 10/27/14 1840  CKTOTAL 41  --   --   TROPONINI  --  0.68* <0.30   BNP: Invalid input(s): POCBNP CBG:  Recent Labs Lab 10/28/14 0731 10/28/14 1207 10/28/14 1656 10/28/14 2149 10/29/14 0737  GLUCAP 127* 125* 149* 106* 138*    Time coordinating discharge:  Greater than 30 minutes  Signed:  Shameek Nyquist, DO [facility] Pager: [phone] 10/29/2014, 11:49 AM

## 2014-10-29 NOTE — Progress Notes (Signed)
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 Okey RegalLisa Krishiv Sandler, PharmD [phone]

## 2014-10-29 NOTE — Progress Notes (Signed)
Nsg Discharge Note  Admit Date:  10/27/2014 Discharge date: 10/29/2014   Wandra MannanWanda Deluna to be D/C'd Home per MD order.  AVS completed.  Copy for chart, and copy for patient signed, and dated. Patient/caregiver able to verbalize understanding.  Discharge Medication:   Medication List    STOP taking these medications        sertraline 50 MG tablet  Commonly known as:  ZOLOFT      TAKE these medications        ALPRAZolam 0.5 MG tablet  Commonly known as:  XANAX  Take 1 tablet (0.5 mg total) by mouth 3 (three) times daily as needed for anxiety.     amitriptyline 25 MG tablet  Commonly known as:  ELAVIL  TAKE 1 TABLET (25 MG TOTAL) BY MOUTH AT BEDTIME.     atorvastatin 10 MG tablet  Commonly known as:  LIPITOR  Take 1 tablet (10 mg total) by mouth daily.     BACTRIM DS 800-160 MG per tablet  Generic drug:  sulfamethoxazole-trimethoprim  Take 1 tablet by mouth every other day. Take every day because she is on Chemo Per patient     cyclophosphamide 25 MG tablet  Commonly known as:  CYTOXAN  Take 75 mg by mouth daily. Give on an empty stomach 1 hour before or 2 hours after meals.     divalproex 250 MG DR tablet  Commonly known as:  DEPAKOTE  Take 2 tablets (500 mg total) by mouth 3 (three) times daily.     gabapentin 300 MG capsule  Commonly known as:  NEURONTIN  Take 3 capsules (900 mg total) by mouth 3 (three) times daily.     HYDROcodone-acetaminophen 5-325 MG per tablet  Commonly known as:  NORCO/VICODIN  Take 1 tablet by mouth 3 (three) times daily as needed for moderate pain.     levETIRAcetam 500 MG tablet  Commonly known as:  KEPPRA  Take 3 tablets (1,500 mg total) by mouth 2 (two) times daily.     predniSONE 10 MG tablet  Commonly known as:  DELTASONE  Take 10 mg by mouth daily with breakfast.     predniSONE 20 MG tablet  Commonly known as:  DELTASONE  Take 3 tablets (60 mg total) by mouth 2 (two) times daily with a meal. X 2 days. Then 2 tabs (40mg ) two  times a day x  2 Days, then 1 tab (20mg ) two times a day x 2 days, then 20mg  daily x 2 days, then return to your 10mg  once daily pill     promethazine 50 MG tablet  Commonly known as:  PHENERGAN  Take 0.5 tablets (25 mg total) by mouth every 6 (six) hours as needed for nausea or vomiting.     topiramate 100 MG tablet  Commonly known as:  TOPAMAX  Take 100 mg by mouth daily.     zolpidem 10 MG tablet  Commonly known as:  AMBIEN  Take 1 tablet (10 mg total) by mouth at bedtime as needed for sleep.        Discharge Assessment: Filed Vitals:   10/29/14 1456  BP: 117/69  Pulse: 99  Temp: 98.9 F (37.2 C)  Resp: 18   Skin clean, dry and intact without evidence of skin break down, skin tear noted on L forehead. IV catheter discontinued intact. Site without signs and symptoms of complications - no redness or edema noted at insertion site, patient denies c/o pain - only slight tenderness at site.  Dressing with slight pressure applied.  D/c Instructions-Education: Discharge instructions given to patient/family with verbalized understanding. D/c education completed with patient/family including follow up instructions, medication list, d/c activities limitations if indicated, with other d/c instructions as indicated by MD - patient able to verbalize understanding, all questions fully answered. Patient instructed to return to ED, call 911, or call MD for any changes in condition.  Patient escorted via WC, and D/C home via private auto.  Khyla Mccumbers Consuella Loselaine, RN 10/29/2014 3:06 PM

## 2014-10-30 ENCOUNTER — Encounter: Payer: Self-pay | Admitting: Internal Medicine

## 2014-11-01 ENCOUNTER — Encounter: Payer: Self-pay | Admitting: Internal Medicine

## 2014-11-07 ENCOUNTER — Other Ambulatory Visit: Payer: Self-pay | Admitting: *Deleted

## 2014-11-07 NOTE — Telephone Encounter (Signed)
Per pharmacy # [id number] last filled on 10/19/14.

## 2014-11-11 ENCOUNTER — Encounter: Payer: Self-pay | Admitting: Internal Medicine

## 2014-11-12 ENCOUNTER — Other Ambulatory Visit: Payer: Self-pay

## 2014-11-12 NOTE — Telephone Encounter (Signed)
Please see email not--Regina Sampson SiBaity gave order to call in Rx---Rx called into pharmacy

## 2014-11-17 ENCOUNTER — Encounter: Payer: Self-pay | Admitting: Internal Medicine

## 2014-11-19 ENCOUNTER — Other Ambulatory Visit: Payer: Self-pay

## 2014-11-19 ENCOUNTER — Encounter: Payer: Self-pay | Admitting: Internal Medicine

## 2014-11-19 ENCOUNTER — Other Ambulatory Visit: Payer: Self-pay | Admitting: Internal Medicine

## 2014-11-19 MED ORDER — HYDROCODONE-ACETAMINOPHEN 5-325 MG PO TABS: 1.0000 | ORAL_TABLET | Freq: Three times a day (TID) | ORAL | Status: DC | PRN

## 2014-11-19 NOTE — Telephone Encounter (Signed)
Rx left in front office for pick up and pt is aware  

## 2014-11-22 ENCOUNTER — Ambulatory Visit: Payer: Self-pay | Admitting: Internal Medicine

## 2014-11-28 ENCOUNTER — Ambulatory Visit (INDEPENDENT_AMBULATORY_CARE_PROVIDER_SITE_OTHER): Payer: Federal, State, Local not specified - PPO | Admitting: Internal Medicine

## 2014-11-28 ENCOUNTER — Encounter: Payer: Self-pay | Admitting: Internal Medicine

## 2014-11-28 VITALS — BP 110/78 | HR 109 | Temp 98.8°F | Wt 185.0 lb

## 2014-11-28 DIAGNOSIS — H469 Unspecified optic neuritis: Secondary | ICD-10-CM

## 2014-11-28 DIAGNOSIS — F418 Other specified anxiety disorders: Secondary | ICD-10-CM

## 2014-11-28 DIAGNOSIS — F419 Anxiety disorder, unspecified: Secondary | ICD-10-CM

## 2014-11-28 DIAGNOSIS — F329 Major depressive disorder, single episode, unspecified: Secondary | ICD-10-CM

## 2014-11-28 DIAGNOSIS — M352 Behcet's disease: Secondary | ICD-10-CM

## 2014-11-28 DIAGNOSIS — F445 Conversion disorder with seizures or convulsions: Secondary | ICD-10-CM

## 2014-11-28 DIAGNOSIS — G4089 Other seizures: Secondary | ICD-10-CM

## 2014-11-28 NOTE — Progress Notes (Signed)
Subjective:    Patient ID: Brenda Mcguire, female    DOB: March 23, 1961, 53 y.o.   MRN: [id number]  HPI  Pt presents to the clinic today for [facility] follow up. On 10/27/14, she called her husband and told him that she felt like she was about to have a seizure. EMS arrived and witnessed pt having a tonic-clonic seizure. She did not get any relief with benzo administration. In the ER, she was intubated for airway protection. CT scan of the head and cervical spine were both normal. She was admitted. EEG showed no epileptic activity. MRI and MRA of the head and brain showed some microvascular changes but otherwise normal. Chest xray and urine were all unremarkable. She did have some slightly elevated troponin's, thought to be demand ischemia. She did have some hypotension during her admission, thought to be due to adrenal insufficiency. She was discharged home on a pred taper. All of her issues seemed to have resolved while inpatient. She insisted upon discharge after 2 days with instructions to follow up with neurology, rheumatology and opthalmology. She reports that she has a follow up with Dr. Karel Jarvis next week. She reports that she fired her rheumatologist. And she needs referral for a opthamologist.  Review of Systems      Past Medical History  Diagnosis Date  . GERD (gastroesophageal reflux disease)   . Hyperlipidemia   . History of blood transfusion   . Chronic headaches   . Phlebitis   . History of colonic polyps   . 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 Tonic clonic seizures   . Anxiety     "situational stress"  . Kidney stones 1191,4782    Current Outpatient Prescriptions  Medication Sig Dispense Refill  . ALPRAZolam (XANAX) 0.5 MG tablet Take 1 tablet (0.5 mg total) by mouth 3 (three) times daily as needed for anxiety. 90 tablet 0  . amitriptyline (ELAVIL) 25 MG tablet TAKE 1 TABLET (25 MG TOTAL) BY MOUTH AT BEDTIME. 30 tablet 5  . atorvastatin (LIPITOR) 10 MG tablet  Take 1 tablet (10 mg total) by mouth daily. 30 tablet 0  . cyclophosphamide (CYTOXAN) 25 MG tablet Take 75 mg by mouth daily. Give on an empty stomach 1 hour before or 2 hours after meals.    . divalproex (DEPAKOTE) 250 MG DR tablet Take 2 tablets (500 mg total) by mouth 3 (three) times daily. 60 tablet 0  . gabapentin (NEURONTIN) 300 MG capsule Take 3 capsules (900 mg total) by mouth 3 (three) times daily. 90 capsule 2  . HYDROcodone-acetaminophen (NORCO/VICODIN) 5-325 MG per tablet Take 1 tablet by mouth 3 (three) times daily as needed for moderate pain. 90 tablet 0  . levETIRAcetam (KEPPRA) 500 MG tablet Take 3 tablets (1,500 mg total) by mouth 2 (two) times daily. 90 tablet 0  . predniSONE (DELTASONE) 10 MG tablet Take 10 mg by mouth daily with breakfast.    . predniSONE (DELTASONE) 20 MG tablet Take 3 tablets (60 mg total) by mouth 2 (two) times daily with a meal. X 2 days. Then 2 tabs (40mg ) two times a day x  2 Days, then 1 tab (20mg ) two times a day x 2 days, then 20mg  daily x 2 days, then return to your 10mg  once daily pill 26 tablet 0  . promethazine (PHENERGAN) 50 MG tablet Take 0.5 tablets (25 mg total) by mouth every 6 (six) hours as needed for nausea or vomiting. 30 tablet 2  .  sulfamethoxazole-trimethoprim (BACTRIM DS) 800-160 MG per tablet Take 1 tablet by mouth every other day. Take every day because she is on Chemo Per patient    . topiramate (TOPAMAX) 100 MG tablet Take 100 mg by mouth daily.     No current facility-administered medications for this visit.    Allergies  Allergen Reactions  . Hepatitis B Vaccine Hives and Shortness Of Breath    Causes wheezing   . Imitrex [Sumatriptan] Other (See Comments)    Makes her have SVT and chest pain  . Iohexol Anaphylaxis, Hives and Shortness Of Breath  . Codeine Itching and Rash  . Keppra Xr [Levetiracetam] Nausea Only    Family History  Problem Relation Age of Onset  . Diabetes Mother   . Hyperlipidemia Mother   . Stroke  Mother   . Hypertension Mother   . Colon cancer Mother   . Breast cancer Mother   . Early death Father   . Heart attack Father   . Heart disease Father   . Hyperlipidemia Father   . COPD Sister     History   Social History  . Marital Status: Married    Spouse Name: N/A    Number of Children: 2  . Years of Education: 14   Occupational History  . Nurse     [employer]  .     Social History Main Topics  . Smoking status: Never Smoker   . Smokeless tobacco: Never Used  . Alcohol Use: No  . Drug Use: No  . Sexual Activity: Yes    Birth Control/ Protection: Surgical   Other Topics Concern  . Not on file   Social History Narrative   Lives with husband.  They have 2 grown children.   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 Denies fever, malaise, headache or abrupt weight changes.  HEENT: Pt reports sore throat. Denies eye pain, eye redness, ear pain, ringing in the ears, wax buildup, runny nose, nasal congestion, bloody nose. Respiratory: Denies difficulty breathing, shortness of breath, cough or sputum production.   Cardiovascular: Denies chest pain, chest tightness, palpitations or swelling in the hands or feet.  Skin: Denies redness, rashes, lesions or ulcercations.  Neurological: Denies dizziness, difficulty with memory, difficulty with speech or problems with balance and coordination.  Psych: Pt reports anxiety and depression. Denies SI/HI.  No other specific complaints in a complete review of systems (except as listed in HPI above).  Objective:   Physical Exam    BP 110/78 mmHg  Pulse 109  Temp(Src) 98.8 F (37.1 C) (Oral)  Wt 185 lb (83.915 kg)  SpO2 99% Wt Readings from Last 3 Encounters:  11/28/14 185 lb (83.915 kg)  10/29/14 182 lb (82.555 kg)  10/23/14 186 lb 4.6 oz (84.5 kg)     General: Appears her stated age, well developed, well nourished in NAD. Skin: Warm, dry and intact.  HEENT: Head: normal shape and size; Eyes: sclera white, no icterus, conjunctiva pink, PERRLA and EOMs intact; Throat/Mouth: Teeth present, mucosa pink and moist, no exudate, lesions or ulcerations noted.  Neck: No adenopathy noted. Cardiovascular: Normal rate and rhythm. S1,S2 noted.  No murmur, rubs or gallops noted. No JVD or BLE edema. No carotid bruits noted. Pulmonary/Chest: Normal effort and positive vesicular breath sounds. No respiratory distress. No  wheezes, rales or ronchi noted.  Neurological: Alert and oriented. Cranial nerves II-XII intact. Coordination normal. +DTRs bilaterally. Psychiatric: Mood tearful today and affect normal. Behavior is normal. Judgment and thought content normal.    BMET    Component Value Date/Time   NA 141 10/29/2014 0800   K 3.3* 10/29/2014 0800   CL 104 10/29/2014 0800   CO2 23 10/29/2014 0800   GLUCOSE 125* 10/29/2014 0800   BUN 20 10/29/2014 0800   CREATININE 0.68 10/29/2014 0800   CALCIUM 8.3* 10/29/2014 0800   GFRNONAA >90 10/29/2014 0800   GFRAA >90 10/29/2014 0800    Lipid Panel     Component Value Date/Time   CHOL 234* 03/02/2013 1115   TRIG 130 08/31/2014 2124   HDL 45.90 03/02/2013 1115   CHOLHDL 5 03/02/2013 1115   VLDL 16.6 03/02/2013 1115    CBC    Component Value Date/Time   WBC 5.0 10/29/2014 0800   RBC 3.54* 10/29/2014 0800   HGB 11.4* 10/29/2014 0800   HCT 33.5* 10/29/2014 0800   PLT 221 10/29/2014 0800   MCV 94.6 10/29/2014 0800   MCH 32.2 10/29/2014 0800   MCHC 34.0 10/29/2014 0800   RDW 12.2 10/29/2014 0800   LYMPHSABS 0.9 10/29/2014 0800   MONOABS 0.3 10/29/2014 0800   EOSABS 0.1 10/29/2014 0800   BASOSABS 0.0 10/29/2014 0800    Hgb A1C Lab Results  Component Value Date   HGBA1C 5.8* 10/23/2014       Assessment & Plan:   Hospital follow up for pseudoseizures/optic neuritis:  Hospital  notes, labs and imaging reviewed with the pt Stressed the importance of following up with neurology ASAP She will continue her current dose of neurontin, depakote, keppra and topamax  Behcets:  She will continue the cytoxan and prednisone Advised her to go ahead and follow up with her rheumatologist She declines this at this time  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

## 2014-11-28 NOTE — Patient Instructions (Signed)

## 2014-11-29 ENCOUNTER — Other Ambulatory Visit: Payer: Self-pay | Admitting: Internal Medicine

## 2014-11-29 DIAGNOSIS — M352 Behcet's disease: Secondary | ICD-10-CM

## 2014-12-03 ENCOUNTER — Encounter: Payer: Self-pay | Admitting: Internal Medicine

## 2014-12-04 MED ORDER — ZOLPIDEM TARTRATE 10 MG PO TABS: 10.0000 mg | ORAL_TABLET | Freq: Every evening | ORAL | Status: DC | PRN

## 2014-12-04 NOTE — Telephone Encounter (Signed)
Rx called in to pharmacy. 

## 2014-12-07 ENCOUNTER — Other Ambulatory Visit: Payer: Self-pay | Admitting: Neurology

## 2014-12-07 ENCOUNTER — Encounter: Payer: Self-pay | Admitting: Internal Medicine

## 2014-12-10 ENCOUNTER — Other Ambulatory Visit: Payer: Self-pay | Admitting: Family Medicine

## 2014-12-10 ENCOUNTER — Encounter: Payer: Self-pay | Admitting: Internal Medicine

## 2014-12-11 ENCOUNTER — Telehealth: Payer: Self-pay

## 2014-12-11 MED ORDER — TOPIRAMATE 100 MG PO TABS: 100.0000 mg | ORAL_TABLET | Freq: Every day | ORAL | Status: DC

## 2014-12-11 NOTE — Telephone Encounter (Signed)
Prior Berkley Harveyauth was completed via phone and has been approved through 12/11/2015--[phone]

## 2014-12-19 ENCOUNTER — Ambulatory Visit (INDEPENDENT_AMBULATORY_CARE_PROVIDER_SITE_OTHER): Payer: Federal, State, Local not specified - PPO | Admitting: Neurology

## 2014-12-19 ENCOUNTER — Encounter: Payer: Self-pay | Admitting: Internal Medicine

## 2014-12-19 VITALS — BP 112/60 | HR 114 | Temp 98.5°F | Resp 20 | Ht 69.0 in | Wt 189.5 lb

## 2014-12-19 DIAGNOSIS — G40219 Localization-related (focal) (partial) symptomatic epilepsy and epileptic syndromes with complex partial seizures, intractable, without status epilepticus: Secondary | ICD-10-CM

## 2014-12-19 DIAGNOSIS — M352 Behcet's disease: Secondary | ICD-10-CM

## 2014-12-19 MED ORDER — HYDROCODONE-ACETAMINOPHEN 5-325 MG PO TABS: 1.0000 | ORAL_TABLET | Freq: Three times a day (TID) | ORAL | Status: DC | PRN
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
## 2014-12-19 NOTE — Patient Instructions (Signed)
1. Continue your current medications 2. [facility] 3. Proceed with Ophthalmology follow-up 4. Proceed with visit with therapist as planned 5. Call our office for any changes, follow-up in 4 months  Seizure Precautions: 1. If medication has been prescribed for you to prevent seizures, take it exactly as directed.  Do not stop taking the medicine without talking to your doctor first, even if you have not had a seizure in a long time.   2. Avoid activities in which a seizure would cause danger to yourself or to others.  Don't operate dangerous machinery, swim alone, or climb in high or dangerous places, such as on ladders, roofs, or girders.  Do not drive unless your doctor says you may.  3. If you have any warning that you may have a seizure, lay down in a safe place where you can't hurt yourself.    4.  No driving for 6 months from last seizure, as [place] [facility] [place].   Please refer to the following link on the Epilepsy Foundation of America's website for more information: http://www.epilepsyfoundation.org/answerplace/Social/driving/drivingu.cfm   5.  Maintain good sleep hygiene.  6.  Contact your doctor if you have any problems that may be related to the medicine you are taking.  7.  Call 911 and bring the patient back to the ED if:        A.  The seizure lasts longer than 5 minutes.       B.  The patient doesn't awaken shortly after the seizure  C.  The patient has new problems such as difficulty seeing, speaking or moving  D.  The patient was injured during the seizure  E.  The patient has a temperature over 102 F (39C)  F.  The patient vomited and now is having trouble breathing

## 2014-12-20 ENCOUNTER — Telehealth: Payer: Self-pay | Admitting: Internal Medicine

## 2014-12-20 NOTE — Telephone Encounter (Addendum)
°  [facility] dismissed [facility]-C , effective October 29, 2014. Dismissal letter sent out by certified / registered mail.  [facility]  Certified dismissal letter returned as undeliverable, unclaimed, return to sender after three attempts by [employer]PS. Letter placed in another envelope and resent as 1st class mail which does not require a signature. 01/02/15 DAJ  Received Certified dismissal letter as undeliverable and unclaimed.  The unopened certified letter was removed from the the 1st class mail envelope and [facility]. 02/20/15 DAJ

## 2014-12-22 ENCOUNTER — Encounter: Payer: Self-pay | Admitting: Neurology

## 2014-12-28 ENCOUNTER — Encounter: Payer: Self-pay | Admitting: Internal Medicine

## 2015-01-01 ENCOUNTER — Encounter: Payer: Self-pay | Admitting: Internal Medicine

## 2015-01-02 ENCOUNTER — Telehealth: Payer: Self-pay | Admitting: Internal Medicine

## 2015-01-02 NOTE — Telephone Encounter (Signed)
Called to notify patient that she has been dismissed.  Letter has been sent on 2 occasions that the patient was dismissed, but the patient continues to contact Nicki Reaperegina Baity, NP.  Patient said she had no idea why Rene KocherRegina would not see her anymore but okay.

## 2015-01-03 ENCOUNTER — Other Ambulatory Visit: Payer: Self-pay | Admitting: Family Medicine

## 2015-01-03 ENCOUNTER — Encounter: Payer: Self-pay | Admitting: Internal Medicine

## 2015-01-03 ENCOUNTER — Encounter: Payer: Self-pay | Admitting: Neurology

## 2015-01-03 MED ORDER — GABAPENTIN 300 MG PO CAPS: 900.0000 mg | ORAL_CAPSULE | Freq: Three times a day (TID) | ORAL | Status: DC

## 2015-01-03 MED ORDER — ALPRAZOLAM 0.5 MG PO TABS: 0.5000 mg | ORAL_TABLET | Freq: Three times a day (TID) | ORAL | Status: DC | PRN

## 2015-01-03 MED ORDER — GABAPENTIN 300 MG PO CAPS
900.0000 mg | ORAL_CAPSULE | Freq: Three times a day (TID) | ORAL | Status: DC
Start: 2015-01-03 — End: 2015-01-03

## 2015-01-03 MED ORDER — TOPIRAMATE 100 MG PO TABS: 100.0000 mg | ORAL_TABLET | Freq: Every day | ORAL | Status: DC

## 2015-01-03 MED ORDER — AMITRIPTYLINE HCL 25 MG PO TABS: 25.0000 mg | ORAL_TABLET | Freq: Every day | ORAL | Status: DC

## 2015-01-03 MED ORDER — ATORVASTATIN CALCIUM 10 MG PO TABS: 10.0000 mg | ORAL_TABLET | Freq: Every day | ORAL | Status: DC

## 2015-01-03 MED ORDER — PROMETHAZINE HCL 50 MG PO TABS
25.0000 mg | ORAL_TABLET | Freq: Four times a day (QID) | ORAL | Status: DC | PRN
Start: 2015-01-03 — End: 2015-01-18

## 2015-01-03 NOTE — Telephone Encounter (Signed)
Xanax and Ambien called into pharmacy

## 2015-01-03 NOTE — Telephone Encounter (Signed)
Rx called in to pharmacy. 

## 2015-01-04 ENCOUNTER — Encounter: Payer: Self-pay | Admitting: Internal Medicine

## 2015-01-07 ENCOUNTER — Encounter: Payer: Self-pay | Admitting: Internal Medicine

## 2015-01-13 ENCOUNTER — Encounter: Payer: Self-pay | Admitting: Neurology

## 2015-01-15 ENCOUNTER — Inpatient Hospital Stay (HOSPITAL_COMMUNITY): Payer: Federal, State, Local not specified - PPO

## 2015-01-15 ENCOUNTER — Inpatient Hospital Stay (HOSPITAL_COMMUNITY)
Admission: EM | Admit: 2015-01-15 | Discharge: 2015-01-18 | DRG: 880 | Disposition: A | Discharge status: Home / Self Care | Payer: Federal, State, Local not specified - PPO | Source: Other Acute Inpatient Hospital | Attending: Pulmonary Disease | Admitting: Pulmonary Disease

## 2015-01-15 ENCOUNTER — Encounter (HOSPITAL_COMMUNITY): Payer: Self-pay | Admitting: Pulmonary Disease

## 2015-01-15 ENCOUNTER — Emergency Department: Payer: Self-pay | Admitting: Student

## 2015-01-15 DIAGNOSIS — R001 Bradycardia, unspecified: Secondary | ICD-10-CM | POA: Diagnosis present

## 2015-01-15 DIAGNOSIS — R51 Headache: Secondary | ICD-10-CM

## 2015-01-15 DIAGNOSIS — E785 Hyperlipidemia, unspecified: Secondary | ICD-10-CM | POA: Diagnosis present

## 2015-01-15 DIAGNOSIS — Z7952 Long term (current) use of systemic steroids: Secondary | ICD-10-CM

## 2015-01-15 DIAGNOSIS — F329 Major depressive disorder, single episode, unspecified: Secondary | ICD-10-CM | POA: Diagnosis present

## 2015-01-15 DIAGNOSIS — K219 Gastro-esophageal reflux disease without esophagitis: Secondary | ICD-10-CM | POA: Diagnosis present

## 2015-01-15 DIAGNOSIS — R569 Unspecified convulsions: Secondary | ICD-10-CM

## 2015-01-15 DIAGNOSIS — G9341 Metabolic encephalopathy: Secondary | ICD-10-CM | POA: Diagnosis present

## 2015-01-15 DIAGNOSIS — R519 Headache, unspecified: Secondary | ICD-10-CM

## 2015-01-15 DIAGNOSIS — D649 Anemia, unspecified: Secondary | ICD-10-CM | POA: Diagnosis present

## 2015-01-15 DIAGNOSIS — M352 Behcet's disease: Secondary | ICD-10-CM | POA: Diagnosis present

## 2015-01-15 DIAGNOSIS — F445 Conversion disorder with seizures or convulsions: Principal | ICD-10-CM | POA: Diagnosis present

## 2015-01-15 DIAGNOSIS — E872 Acidosis: Secondary | ICD-10-CM | POA: Insufficient documentation

## 2015-01-15 DIAGNOSIS — F419 Anxiety disorder, unspecified: Secondary | ICD-10-CM | POA: Diagnosis present

## 2015-01-15 DIAGNOSIS — J9600 Acute respiratory failure, unspecified whether with hypoxia or hypercapnia: Secondary | ICD-10-CM

## 2015-01-15 DIAGNOSIS — Z79899 Other long term (current) drug therapy: Secondary | ICD-10-CM

## 2015-01-15 DIAGNOSIS — J9602 Acute respiratory failure with hypercapnia: Secondary | ICD-10-CM | POA: Insufficient documentation

## 2015-01-15 DIAGNOSIS — I959 Hypotension, unspecified: Secondary | ICD-10-CM | POA: Diagnosis present

## 2015-01-15 DIAGNOSIS — R29898 Other symptoms and signs involving the musculoskeletal system: Secondary | ICD-10-CM

## 2015-01-15 DIAGNOSIS — G894 Chronic pain syndrome: Secondary | ICD-10-CM | POA: Diagnosis present

## 2015-01-15 LAB — BLOOD GAS, ARTERIAL
Acid-base deficit: 4.4 mmol/L — ABNORMAL HIGH (ref 0.0–2.0)
Bicarbonate: 20.8 mEq/L (ref 20.0–24.0)
Drawn by: 31996
FIO2: 0.4 %
MECHVT: 500 mL
O2 Saturation: 98.7 %
PEEP: 5 cmH2O
PO2 ART: 165 mmHg — AB (ref 80.0–100.0)
Patient temperature: 98.6
RATE: 12 resp/min
TCO2: 22.1 mmol/L (ref 0–100)
pCO2 arterial: 42.6 mmHg (ref 35.0–45.0)
pH, Arterial: 7.309 — ABNORMAL LOW (ref 7.350–7.450)

## 2015-01-15 LAB — COMPREHENSIVE METABOLIC PANEL
ALBUMIN: 3.2 g/dL — AB (ref 3.4–5.0)
ALK PHOS: 113 U/L (ref 46–116)
Anion Gap: 9 (ref 7–16)
BILIRUBIN TOTAL: 0.2 mg/dL (ref 0.2–1.0)
BUN: 16 mg/dL (ref 7–18)
Calcium, Total: 8.5 mg/dL (ref 8.5–10.1)
Chloride: 106 mmol/L (ref 98–107)
Co2: 25 mmol/L (ref 21–32)
Creatinine: 1.03 mg/dL (ref 0.60–1.30)
EGFR (African American): >60
EGFR (Non-African Amer.): >60
Glucose: 97 mg/dL (ref 65–99)
Osmolality: 281 (ref 275–301)
POTASSIUM: 4.1 mmol/L (ref 3.5–5.1)
SGOT(AST): 27 U/L (ref 15–37)
SGPT (ALT): 23 U/L (ref 14–63)
SODIUM: 140 mmol/L (ref 136–145)
Total Protein: 6.9 g/dL (ref 6.4–8.2)

## 2015-01-15 LAB — MRSA PCR SCREENING: MRSA by PCR: NEGATIVE

## 2015-01-15 LAB — URINALYSIS, COMPLETE
BACTERIA: NONE SEEN
BILIRUBIN, UR: NEGATIVE
BLOOD: NEGATIVE
GLUCOSE, UR: NEGATIVE mg/dL (ref 0–75)
KETONE: NEGATIVE
LEUKOCYTE ESTERASE: NEGATIVE
Nitrite: NEGATIVE
Ph: 5 (ref 4.5–8.0)
Protein: NEGATIVE
Specific Gravity: 1.03 (ref 1.003–1.030)
WBC UR: 1 /HPF (ref 0–5)

## 2015-01-15 LAB — CBC
HCT: 33.2 % — ABNORMAL LOW (ref 35.0–47.0)
HGB: 11.1 g/dL — AB (ref 12.0–16.0)
MCH: 31.1 pg (ref 26.0–34.0)
MCHC: 33.4 g/dL (ref 32.0–36.0)
MCV: 93 fL (ref 80–100)
Platelet: 281 10*3/uL (ref 150–440)
RBC: 3.57 10*6/uL — ABNORMAL LOW (ref 3.80–5.20)
RDW: 12.6 % (ref 11.5–14.5)
WBC: 7 10*3/uL (ref 3.6–11.0)

## 2015-01-15 LAB — PROTIME-INR
INR: 0.9
Prothrombin Time: 12.7 secs

## 2015-01-15 LAB — CK TOTAL AND CKMB (NOT AT ARMC)
CK, Total: 136 U/L (ref 26–192)
CK-MB: 1 ng/mL (ref 0.5–3.6)

## 2015-01-15 LAB — APTT: Activated PTT: 24.1 secs (ref 23.6–35.9)

## 2015-01-15 LAB — TROPONIN I: Troponin-I: <0.02 ng/mL

## 2015-01-15 LAB — GLUCOSE, CAPILLARY: GLUCOSE-CAPILLARY: 116 mg/dL — AB (ref 70–99)

## 2015-01-15 LAB — HCG, QUANTITATIVE, PREGNANCY: BETA HCG, QUANT.: 2 m[IU]/mL

## 2015-01-15 MED ORDER — ASPIRIN 300 MG RE SUPP: 300.0000 mg | RECTAL | Status: AC

## 2015-01-15 MED ORDER — CHLORHEXIDINE GLUCONATE 0.12 % MT SOLN
15.0000 mL | Freq: Two times a day (BID) | OROMUCOSAL | Status: DC
  Administered 2015-01-15 – 2015-01-16 (×2): 15 mL via OROMUCOSAL
  Filled 2015-01-15 (×2): qty 15

## 2015-01-15 MED ORDER — ALTEPLASE 2 MG IJ SOLR
2.0000 mg | Freq: Once | INTRAMUSCULAR | Status: AC
  Administered 2015-01-15: 2 mg
  Filled 2015-01-15: qty 2

## 2015-01-15 MED ORDER — PREDNISONE 10 MG PO TABS
10.0000 mg | ORAL_TABLET | Freq: Every day | ORAL | Status: DC
  Administered 2015-01-16: 10 mg via ORAL
  Filled 2015-01-15 (×2): qty 1

## 2015-01-15 MED ORDER — LEVETIRACETAM 750 MG PO TABS
1500.0000 mg | ORAL_TABLET | Freq: Two times a day (BID) | ORAL | Status: DC
  Filled 2015-01-15: qty 2

## 2015-01-15 MED ORDER — KCL IN DEXTROSE-NACL 40-5-0.45 MEQ/L-%-% IV SOLN
INTRAVENOUS | Status: DC
  Administered 2015-01-15 – 2015-01-16 (×2): via INTRAVENOUS
  Filled 2015-01-15 (×4): qty 1000

## 2015-01-15 MED ORDER — SODIUM CHLORIDE 0.9 % IJ SOLN
10.0000 mL | INTRAMUSCULAR | Status: DC | PRN
  Administered 2015-01-15: 30 mL
  Administered 2015-01-16: 10 mL
  Filled 2015-01-15 (×2): qty 40

## 2015-01-15 MED ORDER — LEVETIRACETAM 100 MG/ML PO SOLN: 1500.0000 mg | Freq: Two times a day (BID) | ORAL | Status: DC

## 2015-01-15 MED ORDER — PANTOPRAZOLE SODIUM 40 MG IV SOLR
40.0000 mg | Freq: Every day | INTRAVENOUS | Status: DC
  Administered 2015-01-15 – 2015-01-16 (×2): 40 mg via INTRAVENOUS
  Filled 2015-01-15 (×4): qty 40

## 2015-01-15 MED ORDER — ACETAMINOPHEN 325 MG PO TABS
650.0000 mg | ORAL_TABLET | ORAL | Status: DC | PRN
  Administered 2015-01-15 – 2015-01-17 (×4): 650 mg
  Filled 2015-01-15 (×4): qty 2

## 2015-01-15 MED ORDER — VALPROATE SODIUM 500 MG/5ML IV SOLN
500.0000 mg | Freq: Three times a day (TID) | INTRAVENOUS | Status: DC
  Administered 2015-01-15 – 2015-01-17 (×6): 500 mg via INTRAVENOUS
  Filled 2015-01-15 (×9): qty 5

## 2015-01-15 MED ORDER — FENTANYL CITRATE 0.05 MG/ML IJ SOLN
25.0000 ug | INTRAMUSCULAR | Status: DC | PRN
  Administered 2015-01-15 – 2015-01-16 (×4): 100 ug via INTRAVENOUS
  Filled 2015-01-15 (×4): qty 2

## 2015-01-15 MED ORDER — DEXTROSE 5 % IV SOLN
2.0000 ug/min | INTRAVENOUS | Status: DC
  Filled 2015-01-15: qty 4

## 2015-01-15 MED ORDER — ASPIRIN 81 MG PO CHEW
324.0000 mg | CHEWABLE_TABLET | ORAL | Status: AC
  Administered 2015-01-15: 324 mg via ORAL
  Filled 2015-01-15: qty 4

## 2015-01-15 MED ORDER — SODIUM CHLORIDE 0.9 % IV SOLN
INTRAVENOUS | Status: DC
  Administered 2015-01-15: 16:00:00 via INTRAVENOUS

## 2015-01-15 MED ORDER — ACETAMINOPHEN 325 MG PO TABS: 650.0000 mg | ORAL_TABLET | ORAL | Status: DC | PRN

## 2015-01-15 MED ORDER — FENTANYL CITRATE 0.05 MG/ML IJ SOLN: 100.0000 ug | INTRAMUSCULAR | Status: DC | PRN

## 2015-01-15 MED ORDER — HEPARIN SODIUM (PORCINE) 5000 UNIT/ML IJ SOLN
5000.0000 [IU] | Freq: Three times a day (TID) | INTRAMUSCULAR | Status: DC
  Administered 2015-01-15 – 2015-01-17 (×5): 5000 [IU] via SUBCUTANEOUS
  Filled 2015-01-15 (×10): qty 1

## 2015-01-15 MED ORDER — PROPOFOL 10 MG/ML IV EMUL
0.0000 ug/kg/min | INTRAVENOUS | Status: DC
  Administered 2015-01-15: 30 ug/kg/min via INTRAVENOUS
  Administered 2015-01-16: 20 ug/kg/min via INTRAVENOUS
  Filled 2015-01-15 (×3): qty 100

## 2015-01-15 MED ORDER — LEVETIRACETAM IN NACL 1500 MG/100ML IV SOLN
1500.0000 mg | Freq: Two times a day (BID) | INTRAVENOUS | Status: DC
  Administered 2015-01-15 – 2015-01-17 (×4): 1500 mg via INTRAVENOUS
  Filled 2015-01-15 (×5): qty 100

## 2015-01-15 MED ORDER — CETYLPYRIDINIUM CHLORIDE 0.05 % MT LIQD
7.0000 mL | Freq: Four times a day (QID) | OROMUCOSAL | Status: DC
  Administered 2015-01-15 – 2015-01-17 (×4): 7 mL via OROMUCOSAL

## 2015-01-15 MED ORDER — SODIUM CHLORIDE 0.9 % IV SOLN: 250.0000 mL | INTRAVENOUS | Status: DC | PRN

## 2015-01-15 MED ORDER — ONDANSETRON HCL 4 MG/2ML IJ SOLN
4.0000 mg | Freq: Four times a day (QID) | INTRAMUSCULAR | Status: DC | PRN
  Administered 2015-01-16: 4 mg via INTRAVENOUS
  Filled 2015-01-15 (×2): qty 2

## 2015-01-15 NOTE — Progress Notes (Signed)
Patient alert and writing notes, stating her head hurt. Previously given tylenol for headache, waiting for her blood pressure to rebound so she could have pain medication. Explained this to the patient at 2055, at 2104 pt having seizure like activity. E-link MD notified and aware, no new orders at this time, pain medication will be given due to BP rising.

## 2015-01-15 NOTE — Procedures (Signed)
ELECTROENCEPHALOGRAM REPORT   Patient: Brenda Mcguire       Room #: 4U983M06 EEG No. ID: [id number] Age: 54 y.o.        Sex: female Referring Physician: Vassie LollAlva Report Date:  01/15/2015        Interpreting Physician: Thana FarrEYNOLDS, Mischele Detter  History: Brenda Mcguire is an 54 y.o. 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    Medications:  Scheduled: . [START ON 01/16/2015] antiseptic oral rinse  7 mL Mouth Rinse QID  . chlorhexidine  15 mL Mouth Rinse BID  . heparin  5,000 Units Subcutaneous 3 times per day  . levETIRAcetam  1,500 mg Intravenous Q12H  . pantoprazole (PROTONIX) IV  40 mg Intravenous QHS  . [START ON 01/16/2015] predniSONE  10 mg Oral Q breakfast  . valproate sodium  500 mg Intravenous 3 times per day    Conditions of Recording:  This is a 16 channel EEG carried out with the patient in the intubated and sedated state.  Description:  The background activity consists of a well organized beta activity that is diffusely distributed and continuous throughout the recording.  No epileptiform activity is noted.   The patient is able to follow commands during the recording and although there is some artifact noted no other change in the recording is noted.  The patient has multiple events during the recording.  These events include clinical characteristics of arm (both left and right) stiffening, back arching, generalized shaking and twitching.  No change in the background rhythm was noted with any of these events.  The patient also had an episode of unresponsiveness towards the end of the recording.  There again was no change in the background rhythm.  Sternal rub was applied and although the patient withdrew there was no other response.   There was no evidence of normal drowse or sleep.   Hyperventilation and intermittent photic stimulation were not performed.   IMPRESSION: This electroencephalogram was dominated by diffusely distributed beta  activity.  This finding is most consistent with a medication effect.  No epileptiform activity is noted.  The patient had multiple events during the recording including twitching, shaking, extremity stiffening and unresponsiveness. No change in the background rhythm was noted with any of these events.     Thana FarrLeslie Mitchelle Sultan, MD [facility] [phone] 01/15/2015, 6:17 PM

## 2015-01-15 NOTE — Consult Note (Signed)
NEURO HOSPITALIST CONSULT NOTE    Reason for Consult: status epilepticus  HPI:                                                                                                                                          Brenda Mcguire is an 54 y.o. female with a past medical history significant for hyperlipidemia, Behcet's disease, chronic HA, anxiety, and [facility] with seizures versus non epileptic seizures, [facility] for further management of status epilepticus. Patient is intubated on the vent, family is unavailable, and thus all clinical information is obtained from patient's chart: " She was at work when she had seizure like activity and became unresponsive. [facility] where she continued to have seizure like activity and was given multiple rounds of benzo's without any break. She was loaded with Dilantin and intubated for airway protection. She was later [facility] and management". No reported seizures [facility] but bradycardic. Currently on propofol. Review of her hospital records indicate that she had had normal routine EEG's as well as and long term video EEG monitoring to evaluate right arm and right leg jerking which failed to disclose any abnormality. Prior neuroimaging unrevealing. Serologies today reveal slight hypokalemia, normal white count.    Past Medical History  Diagnosis Date  . GERD (gastroesophageal reflux disease)   . Hyperlipidemia   . History of blood transfusion   . Chronic headaches   . Phlebitis   . History of colonic polyps   . 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 Tonic clonic seizures     Pseudo-Seizure  . Anxiety     "situational stress"  . Kidney stones [id number],2001    Past Surgical History  Procedure Laterality Date  . Cholecystectomy    . Tonsillectomy  1970  . Appendectomy  1975  . Lumbar fusion  1999    L5-S1  . Abdominal hysterectomy  1988  .  Portacath placement Left 2008  . Stone extraction with basket    . Back surgery      L5-S1 ALIF  . Adenoidectomy  at 54 years old  . Oophorectomy  1987  . Radiology with anesthesia N/A 10/24/2014    Procedure: RADIOLOGY WITH ANESTHESIA;  Surgeon: Medication Radiologist, MD;  Location: [facility] OR;  Service: Radiology;  Laterality: N/A;    Family History  Problem Relation Age of Onset  . Diabetes Mother   . Hyperlipidemia Mother   . Stroke Mother   . Hypertension Mother   . Colon cancer Mother   . Breast cancer Mother   . Early death Father   . Heart attack Father   . Heart disease Father   . Hyperlipidemia Father   .  COPD Sister     Family History: unable to obtain due to mental status   Social History:  reports that she has never smoked. She has never used smokeless tobacco. She reports that she does not drink alcohol or use illicit drugs.  Allergies  Allergen Reactions  . Hepatitis B Vaccine Hives and Shortness Of Breath    Causes wheezing   . Imitrex [Sumatriptan] Other (See Comments)    Makes her have SVT and chest pain  . Iohexol Anaphylaxis, Hives and Shortness Of Breath  . Codeine Itching and Rash  . Keppra Xr [Levetiracetam] Nausea Only    MEDICATIONS:                                                                                                                     Scheduled: . aspirin  324 mg Oral NOW   Or  . aspirin  300 mg Rectal NOW  . heparin  5,000 Units Subcutaneous 3 times per day  . levETIRAcetam  1,500 mg Oral BID  . pantoprazole (PROTONIX) IV  40 mg Intravenous QHS  . [START ON 01/16/2015] predniSONE  10 mg Oral Q breakfast  . valproate sodium  500 mg Intravenous 3 times per day     ROS: unable to obtain due to mental status.                                                                                                  History obtained from chart review  Physical exam: intubated on the vent, no apparent distress.  BP 140/ 70, Pulse 69, resp. rate  16, SpO2 100 %. Head: normocephalic. Neck: supple, no bruits, no JVD. Cardiac: no murmurs. Lungs: clear. Abdomen: soft, no tender, no mass. Extremities: no edema. Skin: no rash  Neurologic Examination:                                                                                                      Patient sedated with propofol, on the vent. CN: pupils 4 mm, reactive. No gaze preference. Motor: on propofol, but moves legs spontaneously. Sensory: on propofol. DTR's: 2+ all over. Plantars: down. Coordination and gait: unable to test.  Lab Results  Component Value Date/Time   CHOL 234* 03/02/2013 11:15 AM    No results found for this or any previous visit (from the past 48 hour(s)).  No results found.  Assessment/Plan: 54 y/o transferred to [facility] for further management of GTC-SE. Patient currently on propofol, intubated on the vent. 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 Patient takes keppra 1.5 gr BID at home and will continue same dose IV while intubated. Continue Depacon 500 mg TID.Get VPA level. EEG. Will follow up.  Wyatt Portela, MD 01/15/2015, 3:45 PM  [facility]

## 2015-01-15 NOTE — Progress Notes (Signed)
EEG completed; results pending.    

## 2015-01-15 NOTE — H&P (Signed)
PULMONARY / CRITICAL CARE MEDICINE   Name: Brenda Mcguire MRN: [id number] DOB: [birth year]    ADMISSION DATE:  01/15/2015 CONSULTATION DATE:  01/15/2015  REFERRING MD :  [facility]  CHIEF COMPLAINT:  Status Epilepticus  INITIAL PRESENTATION:  54 y.o. F with multiple admissions for seizures vs pseudoseizures, brought to [facility] with seizure like activity.  In ED, intubated for airway protection and later transferred to [facility] for further management.     STUDIES:  CXR 2/2 >>> no acute process CT Head 2/2 >>> no acute process  SIGNIFICANT EVENTS: 2/2 - presented to [facility] with seizure like activity, intubated and [facility]   HISTORY OF PRESENT ILLNESS:  Pt is encephalopathic; therefore, this HPI is obtained from chart review. Brenda Mcguire is a 54 y.o. F with PMH as outlined below.  She has had multiple admissions for seizures in the past.  Of note, there has been concern of pseudoseizures / psychogenic seizures given that EEG's have been negative (she does have hx of epilepsy).  On 2/2, she was at work when she had seizure like activity and became unresponsive.  She was taken to [facility] where she continued to have seizure like activity and was given multiple rounds of benzo's without any break.  She was loaded with Dilantin and intubated for airway protection.    She was later transferred to [facility] for further evaluation and management.  Of note:   Continuous EEG from 05/30/14 did not record any clinical or subclinical seizures.  There was recorded video evidence of right arm and right leg jerking; however, there was no EEG correlation with these events.  These findings could represent simple partial seizures or other non-epileptic etiologies. Routine EEG from 10/27/14 showed normal asleep EEG, no epileptiform activity noted.   PAST MEDICAL HISTORY :   has a past medical history of GERD (gastroesophageal reflux disease); Hyperlipidemia; History of blood transfusion; Chronic headaches;  Phlebitis; History of colonic polyps; Behcet's disease; Tonic clonic seizures; Anxiety; and Kidney stones (4098,1191).  has past surgical history that includes Cholecystectomy; Tonsillectomy (1970); Appendectomy (1975); Lumbar fusion (1999); Abdominal hysterectomy (1988); Portacath placement (Left, 2008); Stone extraction with basket; Back surgery; Adenoidectomy (at 54 years old); Oophorectomy (1987); and Radiology with anesthesia (N/A, 10/24/2014). Prior to Admission medications   Medication Sig Start Date End Date Taking? Authorizing Provider  ALPRAZolam Prudy Feeler) 0.5 MG tablet Take 1 tablet (0.5 mg total) by mouth 3 (three) times daily as needed for anxiety. 01/03/15   Lorre Munroe, NP  amitriptyline (ELAVIL) 25 MG tablet Take 1 tablet (25 mg total) by mouth at bedtime. 01/03/15   Van Clines, MD  atorvastatin (LIPITOR) 10 MG tablet Take 1 tablet (10 mg total) by mouth daily. 01/03/15   Lorre Munroe, NP  cyclophosphamide (CYTOXAN) 25 MG tablet Take 75 mg by mouth daily. Give on an empty stomach 1 hour before or 2 hours after meals.    Historical Provider, MD  divalproex (DEPAKOTE) 250 MG DR tablet Take 2 tablets (500 mg total) by mouth 3 (three) times daily. 09/02/14   Jeanella Craze, NP  ESTRACE VAGINAL 0.1 MG/GM vaginal cream  12/05/14   Historical Provider, MD  gabapentin (NEURONTIN) 300 MG capsule Take 3 capsules (900 mg total) by mouth 3 (three) times daily. 01/03/15   Van Clines, MD  HYDROcodone-acetaminophen (NORCO/VICODIN) 5-325 MG per tablet Take 1 tablet by mouth 3 (three) times daily as needed for moderate pain. 12/19/14   Lorre Munroe, NP  levETIRAcetam (  KEPPRA) 500 MG tablet Take 3 tablets (1,500 mg total) by mouth 2 (two) times daily. 09/02/14   Jeanella Craze, NP  predniSONE (DELTASONE) 10 MG tablet Take 10 mg by mouth daily with breakfast.    Historical Provider, MD  predniSONE (DELTASONE) 20 MG tablet Take 3 tablets (60 mg total) by mouth 2 (two) times daily with a meal. X 2 days.  Then 2 tabs ( ) two times a day x  2 Days, then 1 tab ( ) two times a day x 2 days, then  daily x 2 days, then return to your  once daily pill 10/29/14   Catarina Hartshorn, MD  promethazine (PHENERGAN) 50 MG tablet Take 0.5 tablets (25 mg total) by mouth every 6 (six) hours as needed for nausea or vomiting. 01/03/15   Lorre Munroe, NP  sulfamethoxazole-trimethoprim (BACTRIM DS) 800-160 MG per tablet Take 1 tablet by mouth every other day. Take every day because she is on Chemo Per patient    Historical Provider, MD  topiramate (TOPAMAX) 100 MG tablet Take 1 tablet (100 mg total) by mouth daily. 01/03/15   Van Clines, MD   Allergies  Allergen Reactions  . Hepatitis B Vaccine Hives and Shortness Of Breath    Causes wheezing   . Imitrex [Sumatriptan] Other (See Comments)    Makes her have SVT and chest pain  . Iohexol Anaphylaxis, Hives and Shortness Of Breath  . Codeine Itching and Rash  . Keppra Xr [Levetiracetam] Nausea Only    FAMILY HISTORY:  Family History  Problem Relation Age of Onset  . Diabetes Mother   . Hyperlipidemia Mother   . Stroke Mother   . Hypertension Mother   . Colon cancer Mother   . Breast cancer Mother   . Early death Father   . Heart attack Father   . Heart disease Father   . Hyperlipidemia Father   . COPD Sister     SOCIAL HISTORY:  reports that she has never smoked. She has never used smokeless tobacco. She reports that she does not drink alcohol or use illicit drugs.  REVIEW OF SYSTEMS:  Unable to obtain as pt is intubated.  SUBJECTIVE:   VITAL SIGNS:   HEMODYNAMICS:   VENTILATOR SETTINGS:   INTAKE / OUTPUT: Intake/Output    None     PHYSICAL EXAMINATION: General: WDWN female, in NAD. Neuro: Sedated on vent. HEENT: [place]/AT. B/l pupils dilated but reactive to light. Cardiovascular: RRR, no M/R/G.  Lungs: Respirations even and unlabored.  CTA bilaterally, No W/R/R. Abdomen: BS x 4, soft, NT/ND.  Musculoskeletal: No gross  deformities, no edema.  Skin: Multiple tattoos, intact, warm, no rashes.  LABS:  CBC No results for input(s): WBC, HGB, HCT, PLT in the last 168 hours. Coag's No results for input(s): APTT, INR in the last 168 hours. BMET No results for input(s): NA, K, CL, CO2, BUN, CREATININE, GLUCOSE in the last 168 hours. Electrolytes No results for input(s): CALCIUM, MG, PHOS in the last 168 hours. Sepsis Markers No results for input(s): LATICACIDVEN, PROCALCITON, O2SATVEN in the last 168 hours. ABG No results for input(s): PHART, PCO2ART, PO2ART in the last 168 hours. Liver Enzymes No results for input(s): AST, ALT, ALKPHOS, BILITOT, ALBUMIN in the last 168 hours. Cardiac Enzymes No results for input(s): TROPONINI, PROBNP in the last 168 hours. Glucose No results for input(s): GLUCAP in the last 168 hours.  Imaging   ASSESSMENT / PLAN:  NEUROLOGIC A:   Acute metabolic encephalopathy ? Status  Epilepticus vs pseudoseizure given past hx of documented pseudoseizures ? Conversion Disorder, ? Malingering Chronic Pain P:   Sedation:  Propofol gtt / Fentanyl PRN. RASS goal: 0 to -1. Daily WUA. Depakote level. Continue outpatient Depakote. Add Keppra. Neurology consulted. Sees Dr. Karel JarvisAquino as outpatient for seizure control. Hold outpatient zoloft, xanax, amitriptyline, valium, gabapentin, norco, phenergan.  PULMONARY OETT 2/2 >>> A: Acute respiratory failure in setting of status vs pseudoseizures and inability to protect airway P:   Full mechanical support, wean as able. VAP bundle. SBT in AM. ABG and CXR in AM.  CARDIOVASCULAR A:  Transient bradycardia and hypotension [facility] - likely sedation related P:  Wean levophed to off as BP is now normalizing. Monitor hemodynamics. Hold outpatient atorvastatin.  RENAL A:   No acute issues P:   D5 1/2 NS with 40mEq K @ 16700ml/hr. BMP in AM.  GASTROINTESTINAL A:   GI prophylaxis Nutrition P:  SUP:  Pantoprazole. NPO. TF if remains NPO > 24 hours.  HEMATOLOGIC A:   Mild anemia VTE Prophylaxis P:  Transfuse per usual ICU guidelines. SCD's / Heparin. CBC in AM.  INFECTIOUS A:   No indication of infection Note, pt is on Bactrim QOD (presumably for PCP prophylaxis given that she is on Cytoxan for Behcet's) P:   Monitor clinically. Hold outpatient Bactrim for now.  ENDOCRINE A:   No known issues   P:   SSI if glucose consistently > 180.  RHEUMATOLOGIC A: Behcet's Syndrome P: Continue outpatient prednisone. Hold outpatient cytoxan.   Family updated: None  Interdisciplinary Family Meeting v Palliative Care Meeting:  Due by: 2/9.   Rutherford Guysahul Desai, [place] - C  Pulmonary & Critical Care Medicine Pgr: [phone]  or [phone] 01/15/2015, 2:17 PM  PCCM ATTENDING: I have reviewed pt's initial presentation, consultants notes and hospital database in detail.  The above assessment and plan was formulated under my direction.  In summary: Behcet's disease on chronic prednisone and cyclophosphamide Substance abuse - opioids Recurrent hospitalizations for seizures vs pseudoseizures requiring intubations  Vent support Likely extubation in AM 2/03 Propofol and fentanyl IVFs ordered DVT px SUP Anticonvulsants per Neurology Husband updated over phone    Billy Fischeravid Simonds, MD;  PCCM service; Mobile [phone]

## 2015-01-16 ENCOUNTER — Inpatient Hospital Stay (HOSPITAL_COMMUNITY): Payer: Federal, State, Local not specified - PPO

## 2015-01-16 DIAGNOSIS — J9600 Acute respiratory failure, unspecified whether with hypoxia or hypercapnia: Secondary | ICD-10-CM

## 2015-01-16 DIAGNOSIS — F445 Conversion disorder with seizures or convulsions: Secondary | ICD-10-CM

## 2015-01-16 DIAGNOSIS — R569 Unspecified convulsions: Secondary | ICD-10-CM

## 2015-01-16 LAB — BASIC METABOLIC PANEL
ANION GAP: 4 — AB (ref 5–15)
BUN: 7 mg/dL (ref 6–23)
CALCIUM: 8.1 mg/dL — AB (ref 8.4–10.5)
CHLORIDE: 112 mmol/L (ref 96–112)
CO2: 25 mmol/L (ref 19–32)
Creatinine, Ser: 0.7 mg/dL (ref 0.50–1.10)
GFR calc non Af Amer: >90 mL/min (ref >90)
Glucose, Bld: 149 mg/dL — ABNORMAL HIGH (ref 70–99)
Potassium: 4.1 mmol/L (ref 3.5–5.1)
Sodium: 141 mmol/L (ref 135–145)

## 2015-01-16 LAB — CBC
HEMATOCRIT: 30.1 % — AB (ref 36.0–46.0)
Hemoglobin: 10 g/dL — ABNORMAL LOW (ref 12.0–15.0)
MCH: 30.8 pg (ref 26.0–34.0)
MCHC: 33.2 g/dL (ref 30.0–36.0)
MCV: 92.6 fL (ref 78.0–100.0)
PLATELETS: 243 10*3/uL (ref 150–400)
RBC: 3.25 MIL/uL — AB (ref 3.87–5.11)
RDW: 12.4 % (ref 11.5–15.5)
WBC: 7.1 10*3/uL (ref 4.0–10.5)

## 2015-01-16 LAB — PHENYTOIN LEVEL, TOTAL: Phenytoin Lvl: 10.7 ug/mL (ref 10.0–20.0)

## 2015-01-16 LAB — GLUCOSE, CAPILLARY
GLUCOSE-CAPILLARY: 94 mg/dL (ref 70–99)
Glucose-Capillary: 114 mg/dL — ABNORMAL HIGH (ref 70–99)

## 2015-01-16 LAB — TRIGLYCERIDES: Triglycerides: 102 mg/dL (ref <150)

## 2015-01-16 LAB — VALPROIC ACID LEVEL: Valproic Acid Lvl: 66.4 ug/mL (ref 50.0–100.0)

## 2015-01-16 MED ORDER — FENTANYL CITRATE 0.05 MG/ML IJ SOLN
INTRAMUSCULAR | Status: AC
  Filled 2015-01-16: qty 2

## 2015-01-16 MED ORDER — FENTANYL CITRATE 0.05 MG/ML IJ SOLN
12.5000 ug | INTRAMUSCULAR | Status: DC | PRN
  Administered 2015-01-16: 12.5 ug via INTRAVENOUS

## 2015-01-16 MED ORDER — SODIUM CHLORIDE 0.9 % IV SOLN
INTRAVENOUS | Status: DC
  Administered 2015-01-16: 20:00:00 via INTRAVENOUS

## 2015-01-16 MED ORDER — SODIUM CHLORIDE 0.9 % IV BOLUS (SEPSIS)
500.0000 mL | Freq: Once | INTRAVENOUS | Status: AC
  Administered 2015-01-16: 500 mL via INTRAVENOUS

## 2015-01-16 MED ORDER — FENTANYL CITRATE 0.05 MG/ML IJ SOLN
50.0000 ug | INTRAMUSCULAR | Status: DC | PRN
  Administered 2015-01-16 – 2015-01-17 (×6): 50 ug via INTRAVENOUS
  Filled 2015-01-16 (×5): qty 2

## 2015-01-16 MED ORDER — HYDROCORTISONE NA SUCCINATE PF 100 MG IJ SOLR
50.0000 mg | Freq: Four times a day (QID) | INTRAMUSCULAR | Status: DC
  Administered 2015-01-16 – 2015-01-17 (×4): 50 mg via INTRAVENOUS
  Filled 2015-01-16 (×8): qty 1

## 2015-01-16 MED ORDER — FENTANYL CITRATE 0.05 MG/ML IJ SOLN
25.0000 ug | INTRAMUSCULAR | Status: DC | PRN
  Administered 2015-01-16: 25 ug via INTRAVENOUS

## 2015-01-16 MED ORDER — ALPRAZOLAM 0.5 MG PO TABS
0.5000 mg | ORAL_TABLET | Freq: Three times a day (TID) | ORAL | Status: DC | PRN
Start: 2015-01-16 — End: 2015-01-18
  Administered 2015-01-16 – 2015-01-18 (×5): 0.5 mg via ORAL
  Filled 2015-01-16 (×5): qty 1

## 2015-01-16 MED ORDER — PROPOFOL 10 MG/ML IV EMUL
0.0000 ug/kg/min | INTRAVENOUS | Status: DC
  Administered 2015-01-16: 25 ug/kg/min via INTRAVENOUS

## 2015-01-16 NOTE — Progress Notes (Signed)
Pt continuing to report severe headache. Fentanyl given. Dr. Cyril Mourningamillo notified, left side weakness still present, fluctuating some each time assessed. CT head ordered.   Holly Bodilyulbertson, Bethany Leigh

## 2015-01-16 NOTE — Progress Notes (Signed)
Neuro MD aware of CT results. Pt confused to time when asked, but otherwise oriented. Still c/o h/a. Fentanyl dose increased. No other orders received. Will continue to monitor.

## 2015-01-16 NOTE — Progress Notes (Signed)
NEURO HOSPITALIST PROGRESS NOTE   SUBJECTIVE:                                                                                                                        Remains intubated on the vent, on propofol. She open her eyes and follows commands. Reports to be weak in the left side. EEG: " dominated by diffuse beta activity, most likely medication effect.Patient had multiple events during the recording including twitching, shaking, extremity stiffening and unresponsiveness. No change in the background rhythm was noted with any of these events".  On IV keppra and Depacon. VPA level pending.  OBJECTIVE:                                                                                                                           Vital signs in last 24 hours: Temp:  [97.6 F (36.4 C)-99.2 F (37.3 C)] 98.3 F (36.8 C) (02/03 0800) Pulse Rate:  [45-94] 45 (02/03 0600) Resp:  [12-20] 14 (02/03 0600) BP: (66-127)/(40-97) 110/67 mmHg (02/03 0600) SpO2:  [100 %] 100 % (02/03 0600) FiO2 (%):  [40 %-100 %] 40 % (02/03 0330) Weight:  [86 kg (189 lb 9.5 oz)-88.1 kg (194 lb 3.6 oz)] 88.1 kg (194 lb 3.6 oz) (02/03 0500)  Intake/Output from previous day: 02/02 0701 - 02/03 0700 In: 1720.1 [P.O.:30; I.V.:1425.1; IV Piggyback:265] Out: 1300 [Urine:1300] Intake/Output this shift:   Nutritional status: Diet NPO time specified  Past Medical History  Diagnosis Date  . GERD (gastroesophageal reflux disease)   . Hyperlipidemia   . History of blood transfusion   . Chronic headaches   . Phlebitis   . History of colonic polyps   . 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 Tonic clonic seizures     Pseudo-Seizure  . Anxiety     "situational stress"  . Kidney stones [id number],[id number],2001   Physical exam: intubated on the vent, no apparent distress.  Head: normocephalic. Neck: supple, no bruits, no JVD. Cardiac: no murmurs. Lungs: clear. Abdomen: soft, no tender, no  mass. Extremities: no edema. Skin: no rash Neurologic Exam:  Mental status: on propofol but open eyes and follows commands. CN 2-12: pupils 4 mm, reactive. EOM full without nystagmus.  Face symmetric. Motor: moves right side>left Sensory: no tested. Coordination and gait: no tested  Lab Results: Lab Results  Component Value Date/Time   CHOL 234* 03/02/2013 11:15 AM   Lipid Panel  Recent Labs  01/16/15 0420  TRIG 102    Studies/Results: Dg Chest Port 1 View  01/16/2015   CLINICAL DATA:  Seizure, intubation  EXAM: PORTABLE CHEST - 1 VIEW  COMPARISON:  01/15/2015  FINDINGS: Endotracheal tube is in stable position as is the left Port-A-Cath and NG tube. Areas of bibasilar atelectasis with low lung volumes. Heart is normal size. No effusions. No acute bony abnormality.  IMPRESSION: Stable support devices.  Bibasilar atelectasis.   Electronically Signed   By: Charlett Nose M.D.   On: 01/16/2015 08:29   Dg Chest Port 1 View  01/15/2015   CLINICAL DATA:  Hypoxia  EXAM: PORTABLE CHEST - 1 VIEW  COMPARISON:  January 15, 2015 study obtained earlier in the day  FINDINGS: Endotracheal tube tip is 1.8 cm above the carina. Nasogastric tube tip and side port are in the stomach. Port-A-Cath tip is in the super vena cava. No pneumothorax. Lungs are clear. Heart size and pulmonary vascularity are normal. No adenopathy. No bone lesions.  IMPRESSION: Tube and catheter positions as described without pneumothorax. No edema or consolidation.   Electronically Signed   By: Bretta Bang M.D.   On: 01/15/2015 16:10    MEDICATIONS                                                                                                                        Scheduled: . antiseptic oral rinse  7 mL Mouth Rinse QID  . chlorhexidine  15 mL Mouth Rinse BID  . heparin  5,000 Units Subcutaneous 3 times per day  . levETIRAcetam  1,500 mg Intravenous Q12H  . pantoprazole (PROTONIX) IV  40 mg Intravenous QHS  . predniSONE   10 mg Oral Q breakfast  . valproate sodium  500 mg Intravenous 3 times per day    ASSESSMENT/PLAN:                                                                                                           54 y/o transferred to [facility] for further management of GTC-SE.  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 Unsure if she is really weak in the left side: this is not a Todd's paralysis as the seizures appear to be psychogenic. Will order MRI brain but doubt structural  brain disease. Will follow up.  Wyatt Portela, MD [facility] [phone]  01/16/2015, 8:41 AM

## 2015-01-16 NOTE — H&P (Signed)
PULMONARY / CRITICAL CARE MEDICINE   Name: Wandra MannanWanda Appling MRN: [id number] DOB: [birth year]    ADMISSION DATE:  01/15/2015 CONSULTATION DATE:  01/16/2015  REFERRING MD :  [facility]  CHIEF COMPLAINT:  Status Epilepticus  INITIAL PRESENTATION:  54 y.o. F with multiple admissions for seizures vs pseudoseizures, [facility] with seizure like activity.  In ED, intubated for airway protection and later [facility].     STUDIES:  CXR 2/2 >>> no acute process CT Head 2/2 >>> no acute process  SIGNIFICANT EVENTS: 2/2 - [facility] with seizure like activity, [facility] and [facility]  SUBJECTIVE: rass 3, agitation on vent  VITAL SIGNS: Temp:  [97.6 F (36.4 C)-99.2 F (37.3 C)] 98.3 F (36.8 C) (02/03 0800) Pulse Rate:  [45-94] 90 (02/03 0854) Resp:  [12-24] 24 (02/03 0854) BP: (66-132)/(40-97) 132/68 mmHg (02/03 0854) SpO2:  [100 %] 100 % (02/03 0600) FiO2 (%):  [40 %-100 %] 40 % (02/03 0854) Weight:  [86 kg (189 lb 9.5 oz)-88.1 kg (194 lb 3.6 oz)] 88.1 kg (194 lb 3.6 oz) (02/03 0500) HEMODYNAMICS:   VENTILATOR SETTINGS: Vent Mode:  [-] PRVC FiO2 (%):  [40 %-100 %] 40 % Set Rate:  [12 bmp-14 bmp] 14 bmp Vt Set:  [500 mL] 500 mL PEEP:  [5 cmH20] 5 cmH20 Plateau Pressure:  [15 cmH20-21 cmH20] 21 cmH20 INTAKE / OUTPUT: Intake/Output      02/02 0701 - 02/03 0700 02/03 0701 - 02/04 0700   P.O. 30    I.V. (mL/kg) 1425.1 (16.2)    IV Piggyback 265    Total Intake(mL/kg) 1720.1 (19.5)    Urine (mL/kg/hr) 1300    Total Output 1300     Net +420.1            PHYSICAL EXAMINATION: General: WDWN female, agitation severe Neuro: moves all ext equal, angry, agitation rass up HEENT: [place]/AT per Cardiovascular: RRR, no M/R/G.  Lungs: Rhonchi when coughing Abdomen: BS x 4, soft, NT/ND.  Musculoskeletal: No gross deformities, no edema.  Skin: Multiple tattoos, intact, warm, no rashes.  LABS:  CBC  Recent Labs Lab 01/16/15 0420   WBC 7.1  HGB 10.0*  HCT 30.1*  PLT 243   Coag's No results for input(s): APTT, INR in the last 168 hours. BMET  Recent Labs Lab 01/16/15 0420  NA 141  K 4.1  CL 112  CO2 25  BUN 7  CREATININE 0.70  GLUCOSE 149*   Electrolytes  Recent Labs Lab 01/16/15 0420  CALCIUM 8.1*   Sepsis Markers No results for input(s): LATICACIDVEN, PROCALCITON, O2SATVEN in the last 168 hours. ABG  Recent Labs Lab 01/15/15 1643  PHART 7.309*  PCO2ART 42.6  PO2ART 165.0*   Liver Enzymes No results for input(s): AST, ALT, ALKPHOS, BILITOT, ALBUMIN in the last 168 hours. Cardiac Enzymes No results for input(s): TROPONINI, PROBNP in the last 168 hours. Glucose  Recent Labs Lab 01/15/15 1756 01/15/15 2349  GLUCAP 116* 94    Imaging   ASSESSMENT / PLAN:  NEUROLOGIC A:   Acute metabolic encephalopathy ? Status Epilepticus vs pseudoseizure given past hx of documented pseudoseizures ? Conversion Disorder, ? Malingering Chronic Pain P:   Sedation:  Propofol gtt / Fentanyl PRN. RASS goal: 0  Daily WUA on going, may need low dose propofol Depakote level awaited Continue outpatient Depakote. Keppra. Neurology consulted Hold outpatient zoloft, xanax, amitriptyline, valium, gabapentin, norco, phenergan. MRI ordered, but extubation is priority, [facility]  [facility]  PULMONARY OETT 2/2 >>> A: Acute respiratory failure in setting of status vs pseudoseizures and inability to protect airway P:   More awake, improved airway protection skills PS 10 noted, if able reduce to 5/5 and assess No evidence aspiration  CARDIOVASCULAR A:  Transient bradycardia and hypotension en route to [facility] - likely sedation related P:  Goal to dc levophed, bolus Monitor hemodynamics. Hold outpatient atorvastatin. Consider dc tele  RENAL A:   No acute issues P:   D5 1/2 NS with K @ 167ml/hr - dc k in bag Bolus Add maintenance different BMP in AM.  GASTROINTESTINAL A:   GI  prophylaxis Nutrition P:  SUP: Pantoprazole. NPO. TF if remains NPO > 24 hours, unlikley to need  HEMATOLOGIC A:   Mild anemia VTE Prophylaxis P:  Transfuse per usual ICU guidelines. SCD's / Heparin. CBC in AM with depakote  INFECTIOUS A:   No indication of infection Immunosuppressed Note, pt is on Bactrim QOD (presumably for PCP prophylaxis given that she is on Cytoxan for Behcet's) P:   Monitor clinically. Hold outpatient Bactrim for now. Low threhsod add asp abx nosomcial pcxr in am   ENDOCRINE A:   Presume adrenal insuff and low BP P:   SSI if glucose consistently > 180. Add stress roids  RHEUMATOLOGIC A: Behcet's Syndrome P: Continue stress steroids Hold outpatient cytoxan.   Family updated: None  Interdisciplinary Family Meeting v Palliative Care Meeting:  Due by: 2/9.  Ccm time 30 min   Mcarthur Rossetti. Tyson Alias, MD, FACP Pgr: [phone] [facility]

## 2015-01-16 NOTE — Procedures (Signed)
Extubation Procedure Note  Patient Details:   Name: Brenda MannanWanda Mcguire DOB: [birth year] MRN: [id number]   Airway Documentation:     Evaluation  O2 sats: stable throughout Complications: No apparent complications Patient did tolerate procedure well. Bilateral Breath Sounds: Clear, Diminished Suctioning: Oral, Airway No  Pt was extubated per MD order to 2 LPM nasal cannula. Pt had positive cuff leak. When attempting to remove ETT patient was biting down on tube. Once removed pt had good strong cough and no signs of stridor. Vitals stable. Pt was not able to say name at this time. RN at bedside. RT will continue to monitor.   Manson Luckadoo M 01/16/2015, 10:05 AM

## 2015-01-16 NOTE — Progress Notes (Signed)
Pt thrashing in bed, anxious, unable to console. Pt on propofol. Dr. Tyson AliasFeinstein called. Propofol remained on per MD. MD then arrived to bedside, saw pt, and wrote for extubation orders. Propofol turned off, Pt extubated, no longer thrashing, resting. BS clear bilaterally.

## 2015-01-16 NOTE — Progress Notes (Signed)
UR completed.  Daaron Dimarco, RN BSN [facility] CCM Trauma/Neuro ICU Case Manager [phone]  

## 2015-01-17 ENCOUNTER — Encounter: Payer: Self-pay | Admitting: Internal Medicine

## 2015-01-17 ENCOUNTER — Inpatient Hospital Stay (HOSPITAL_COMMUNITY): Payer: Federal, State, Local not specified - PPO

## 2015-01-17 DIAGNOSIS — R569 Unspecified convulsions: Secondary | ICD-10-CM | POA: Insufficient documentation

## 2015-01-17 DIAGNOSIS — J9602 Acute respiratory failure with hypercapnia: Secondary | ICD-10-CM | POA: Insufficient documentation

## 2015-01-17 DIAGNOSIS — E872 Acidosis: Secondary | ICD-10-CM | POA: Insufficient documentation

## 2015-01-17 DIAGNOSIS — F445 Conversion disorder with seizures or convulsions: Principal | ICD-10-CM

## 2015-01-17 LAB — COMPREHENSIVE METABOLIC PANEL
ALBUMIN: 3 g/dL — AB (ref 3.5–5.2)
ALT: 39 U/L — ABNORMAL HIGH (ref 0–35)
AST: 22 U/L (ref 0–37)
Alkaline Phosphatase: 81 U/L (ref 39–117)
Anion gap: 9 (ref 5–15)
CO2: 26 mmol/L (ref 19–32)
Calcium: 8.6 mg/dL (ref 8.4–10.5)
Chloride: 108 mmol/L (ref 96–112)
Creatinine, Ser: 0.63 mg/dL (ref 0.50–1.10)
GFR calc non Af Amer: >90 mL/min (ref >90)
Glucose, Bld: 127 mg/dL — ABNORMAL HIGH (ref 70–99)
Potassium: 3.9 mmol/L (ref 3.5–5.1)
Sodium: 143 mmol/L (ref 135–145)
Total Bilirubin: 0.4 mg/dL (ref 0.3–1.2)
Total Protein: 5.9 g/dL — ABNORMAL LOW (ref 6.0–8.3)

## 2015-01-17 LAB — CBC WITH DIFFERENTIAL/PLATELET
BASOS PCT: 0 % (ref 0–1)
Basophils Absolute: 0 10*3/uL (ref 0.0–0.1)
EOS PCT: 0 % (ref 0–5)
Eosinophils Absolute: 0 10*3/uL (ref 0.0–0.7)
HEMATOCRIT: 30.5 % — AB (ref 36.0–46.0)
HEMOGLOBIN: 10.1 g/dL — AB (ref 12.0–15.0)
Lymphocytes Relative: 20 % (ref 12–46)
Lymphs Abs: 1.4 10*3/uL (ref 0.7–4.0)
MCH: 30.2 pg (ref 26.0–34.0)
MCHC: 33.1 g/dL (ref 30.0–36.0)
MCV: 91.3 fL (ref 78.0–100.0)
Monocytes Absolute: 0.4 10*3/uL (ref 0.1–1.0)
Monocytes Relative: 5 % (ref 3–12)
Neutro Abs: 5.2 10*3/uL (ref 1.7–7.7)
Neutrophils Relative %: 75 % (ref 43–77)
PLATELETS: 257 10*3/uL (ref 150–400)
RBC: 3.34 MIL/uL — ABNORMAL LOW (ref 3.87–5.11)
RDW: 12.1 % (ref 11.5–15.5)
WBC: 7 10*3/uL (ref 4.0–10.5)

## 2015-01-17 LAB — GLUCOSE, CAPILLARY
GLUCOSE-CAPILLARY: 182 mg/dL — AB (ref 70–99)
Glucose-Capillary: 165 mg/dL — ABNORMAL HIGH (ref 70–99)

## 2015-01-17 MED ORDER — TOPIRAMATE 100 MG PO TABS
100.0000 mg | ORAL_TABLET | Freq: Every day | ORAL | Status: DC
  Administered 2015-01-17 – 2015-01-18 (×2): 100 mg via ORAL
  Filled 2015-01-17 (×2): qty 1

## 2015-01-17 MED ORDER — CYCLOPHOSPHAMIDE 50 MG PO TABS
100.0000 mg | ORAL_TABLET | Freq: Every day | ORAL | Status: DC
Start: 2015-01-17 — End: 2015-01-18
  Administered 2015-01-17: 100 mg via ORAL
  Filled 2015-01-17 (×3): qty 2

## 2015-01-17 MED ORDER — VALPROIC ACID 250 MG PO CAPS
500.0000 mg | ORAL_CAPSULE | Freq: Three times a day (TID) | ORAL | Status: DC
  Administered 2015-01-17 – 2015-01-18 (×3): 500 mg via ORAL
  Filled 2015-01-17 (×4): qty 2

## 2015-01-17 MED ORDER — LEVETIRACETAM 500 MG PO TABS
1000.0000 mg | ORAL_TABLET | Freq: Two times a day (BID) | ORAL | Status: DC
  Administered 2015-01-17 – 2015-01-18 (×2): 1000 mg via ORAL
  Filled 2015-01-17 (×3): qty 2

## 2015-01-17 MED ORDER — SULFAMETHOXAZOLE-TRIMETHOPRIM 400-80 MG PO TABS
1.0000 | ORAL_TABLET | ORAL | Status: DC
  Administered 2015-01-18: 1 via ORAL
  Filled 2015-01-17 (×2): qty 1

## 2015-01-17 MED ORDER — HYDROCORTISONE NA SUCCINATE PF 100 MG IJ SOLR
50.0000 mg | Freq: Two times a day (BID) | INTRAMUSCULAR | Status: DC
Start: 2015-01-17 — End: 2015-01-17
  Filled 2015-01-17: qty 1

## 2015-01-17 MED ORDER — ENOXAPARIN SODIUM 40 MG/0.4ML ~~LOC~~ SOLN
40.0000 mg | Freq: Every day | SUBCUTANEOUS | Status: DC
  Administered 2015-01-17 – 2015-01-18 (×2): 40 mg via SUBCUTANEOUS
  Filled 2015-01-17 (×2): qty 0.4

## 2015-01-17 MED ORDER — LEVETIRACETAM 500 MG PO TABS: 1000.0000 mg | ORAL_TABLET | Freq: Two times a day (BID) | ORAL | Status: DC

## 2015-01-17 MED ORDER — LEVETIRACETAM 500 MG PO TABS
1000.0000 mg | ORAL_TABLET | Freq: Two times a day (BID) | ORAL | Status: DC
  Filled 2015-01-17: qty 2

## 2015-01-17 MED ORDER — GABAPENTIN 300 MG PO CAPS
600.0000 mg | ORAL_CAPSULE | Freq: Two times a day (BID) | ORAL | Status: DC
  Administered 2015-01-17 – 2015-01-18 (×2): 600 mg via ORAL
  Filled 2015-01-17 (×2): qty 2

## 2015-01-17 MED ORDER — PREDNISONE 5 MG PO TABS
10.0000 mg | ORAL_TABLET | Freq: Every day | ORAL | Status: DC
  Administered 2015-01-17 – 2015-01-18 (×2): 10 mg via ORAL
  Filled 2015-01-17: qty 2
  Filled 2015-01-17 (×2): qty 1

## 2015-01-17 NOTE — Progress Notes (Signed)
Pt is admitted to 4N04 from Neuro ICU 10M. Admission vital signs are stable

## 2015-01-17 NOTE — Progress Notes (Signed)
NEURO HOSPITALIST PROGRESS NOTE   SUBJECTIVE:                                                                                                                        Successfully extubated yesterday. Patient is unresponsive after having 2 of her habitual generalized seizures several minutes ago. Nurse tells me that they were back to back seizures but her vitals signs remained unchanged during the events: " turned onto left side, stiffened extremities, generalized shaking, making a grunting sound.Sats 94-100%. HR 110s, BP 142/79. Pt's [place] found to be Resting below nose. Pool of blood noted on sheet under mouth. Pt unresponsive to sternal rub and voice. Seizure witnessed lasted about 2 minutes". Had CT brain yesterday that was unremarkable. VPA level 66.4 Continues on IV keppra and Depacon.   OBJECTIVE:                                                                                                                           Vital signs in last 24 hours: Temp:  [98.8 F (37.1 C)-99.4 F (37.4 C)] 98.9 F (37.2 C) (02/04 0000) Pulse Rate:  [70-114] 97 (02/04 0800) Resp:  [11-24] 23 (02/04 0800) BP: (88-148)/(49-130) 144/75 mmHg (02/04 0800) SpO2:  [91 %-100 %] 100 % (02/04 0800) FiO2 (%):  [40 %] 40 % (02/03 0854) Weight:  [86.6 kg (190 lb 14.7 oz)] 86.6 kg (190 lb 14.7 oz) (02/04 0532)  Intake/Output from previous day: 02/03 0701 - 02/04 0700 In: 2827.5 [P.O.:300; I.V.:1712.5; IV Piggyback:815] Out: 3975 [Urine:3975] Intake/Output this shift: Total I/O In: 175 [I.V.:75; IV Piggyback:100] Out: -  Nutritional status: Diet regular  Past Medical History  Diagnosis Date  . GERD (gastroesophageal reflux disease)   . Hyperlipidemia   . History of blood transfusion   . Chronic headaches   . Phlebitis   . History of colonic polyps   . 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 Tonic clonic seizures     Pseudo-Seizure  . Anxiety     "situational  stress"  . Kidney stones 4540,9811   Physical exam: unresponsive.  Head: normocephalic. Neck: supple, no bruits, no JVD. Cardiac: no murmurs. Lungs: clear. Abdomen: soft, no tender, no mass. Extremities: no  edema. Skin: no rash  Neurologic Exam:  Mental status: unresponsive CN 2-12: pupils 4 mm, reactive. EOM full without nystagmus. Face symmetric. Motor: no spontaneous motor activity Sensory: doesn't react to pain Coordination and gait: no tested  Lab Results: Lab Results  Component Value Date/Time   CHOL 234* 03/02/2013 11:15 AM   Lipid Panel  Recent Labs  01/16/15 0420  TRIG 102    Studies/Results: Ct Head Wo Contrast  01/16/2015   CLINICAL DATA:  Headache.  Left arm weakness.  Seizure yesterday.  EXAM: CT HEAD WITHOUT CONTRAST  TECHNIQUE: Contiguous axial images were obtained from the base of the skull through the vertex without intravenous contrast.  COMPARISON:  01/15/2015  FINDINGS: The brain has normal appearance without evidence of atrophy, old or acute infarction, mass lesion, hemorrhage, hydrocephalus or extra-axial collection. Near complete opacification of the left maxillary sinus again demonstrated. Other sinuses, middle ears and mastoids are clear.  IMPRESSION: Normal appearance of the brain.  Left maxillary sinusitis.   Electronically Signed   By: Paulina Fusi M.D.   On: 01/16/2015 17:14   Dg Chest Port 1 View  01/17/2015   CLINICAL DATA:  Respiratory acidosis, seizure disorder, acute respiratory failure,NSTEMI.  EXAM: PORTABLE CHEST - 1 VIEW  COMPARISON:  Portable chest x-ray of January 16, 2015  FINDINGS: The trachea has been extubated. The lungs are mildly hypoinflated. There is no focal infiltrate. There is some crowding of the pulmonary vascularity. The cardiac silhouette is mildly enlarged. There is no pleural effusion or pneumothorax. The Port-A-Cath appliance tip projects over the distal third of the SVC and is stable. The bony thorax is unremarkable.   IMPRESSION: Improved bibasilar atelectasis. Minimal airspace opacity persists in the right infrahilar region.   Electronically Signed   By: David  [place]   On: 01/17/2015 08:05   Dg Chest Port 1 View  01/16/2015   CLINICAL DATA:  Seizure, intubation  EXAM: PORTABLE CHEST - 1 VIEW  COMPARISON:  01/15/2015  FINDINGS: Endotracheal tube is in stable position as is the left Port-A-Cath and NG tube. Areas of bibasilar atelectasis with low lung volumes. Heart is normal size. No effusions. No acute bony abnormality.  IMPRESSION: Stable support devices.  Bibasilar atelectasis.   Electronically Signed   By: Charlett Nose M.D.   On: 01/16/2015 08:29   Dg Chest Port 1 View  01/15/2015   CLINICAL DATA:  Hypoxia  EXAM: PORTABLE CHEST - 1 VIEW  COMPARISON:  January 15, 2015 study obtained earlier in the day  FINDINGS: Endotracheal tube tip is 1.8 cm above the carina. Nasogastric tube tip and side port are in the stomach. Port-A-Cath tip is in the super vena cava. No pneumothorax. Lungs are clear. Heart size and pulmonary vascularity are normal. No adenopathy. No bone lesions.  IMPRESSION: Tube and catheter positions as described without pneumothorax. No edema or consolidation.   Electronically Signed   By: Bretta Bang M.D.   On: 01/15/2015 16:10    MEDICATIONS  Scheduled: . heparin  5,000 Units Subcutaneous 3 times per day  . hydrocortisone sodium succinate  50 mg Intravenous Q6H  . levETIRAcetam  1,500 mg Intravenous Q12H  . pantoprazole (PROTONIX) IV  40 mg Intravenous QHS  . valproate sodium  500 mg Intravenous 3 times per day    ASSESSMENT/PLAN:                                                                                                           54 [facility] management of GTC-SE.  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 Presently unresponsive after having back to back seizures that according to description seem to be non epileptic seizures. Will reassess and determine if continuous EEG warranted. Continue current AED. Will follow up.   Wyatt Portelasvaldo Camilo, MD [facility] [phone]  01/17/2015, 8:43 AM

## 2015-01-17 NOTE — Progress Notes (Signed)
RN walked into pt's room and witness seizure-like activity. Pt was found to be turned onto left side, stiffened extremities, generalized shaking, making a grunting sound. Sats 94-100%. HR 110s, BP 142/79. Pt's [place] found to be  Resting below nose. Pool of blood noted on sheet under mouth. Pt unresponsive to sternal rub and voice. Seizure witnessed lasted about 2 minutes. CCM called and notified. No orders received. Pt's mouth suctioned. MD to come and assess. Told to call neurology. Dr. Cyril Mourningamillo called and arrived to bedside, witnessed another seizure identical to previous. Pt stopped seizing while neuro at bedside, but remained unresponsive. VSS. AM dose of keppra infusing. No other orders received. After MD left pt continuing to intermittently seize. Will continue to monitor.

## 2015-01-17 NOTE — Progress Notes (Signed)
PULMONARY / CRITICAL CARE MEDICINE   Name: Brenda MannanWanda Pitcock MRN: [id number] DOB: 11-Jan-1961    ADMISSION DATE:  01/15/2015 CONSULTATION DATE:  01/17/2015  REFERRING MD :   [facility]  CHIEF COMPLAINT:  Status Epilepticus  INITIAL PRESENTATION:  54 y.o. F with multiple admissions for seizures vs pseudoseizures, [facility].  In ED, intubated for airway protection and [facility].     STUDIES/EVENTS:  2/02 [facility], [facility] head: no acute process 2/02 Neurology consultation: rec continue anticonvulsant medications. EEG ordered 2/02 EEG: This electroencephalogram was dominated by diffusely distributed beta activity. This finding is most consistent with a medication effect. No epileptiform activity is noted. The patient had multiple events during the recording including twitching, shaking, extremity stiffening and unresponsiveness. No change in the background rhythm was noted with any of these events 2/03 Extubated.  2/04 Continues to exhibit seizure-like events. No post-ictal state evident. Very emotional when it was explained ot her that her EEG does not show electrical evidence of seizure during the periods when she has apparent convulsions. Neurology re-iterated their impression that these are pseudoseizures. She expressed her desire to be be discharged to home. Her husband expressed extreme reservations about her going home. Transferred to med-surg floor   SUBJECTIVE:  RASS +1. Tearful and angry/frustrated. No respiratory distress.   VITAL SIGNS: Temp:  [98.5 F (36.9 C)-99.4 F (37.4 C)] 98.5 F (36.9 C) (02/04 0800) Pulse Rate:  [70-114] 88 (02/04 1100) Resp:  [11-24] 13 (02/04 1100) BP: (88-144)/(49-79) 124/70 mmHg (02/04 1100) SpO2:  [89 %-100 %] 99 % (02/04 1100) Weight:  [86.6 kg (190 lb 14.7 oz)] 86.6 kg (190 lb 14.7 oz) (02/04  0532) HEMODYNAMICS:   VENTILATOR SETTINGS:   INTAKE / OUTPUT: Intake/Output      02/03 0701 - 02/04 0700 02/04 0701 - 02/05 0700   P.O. 300    I.V. (mL/kg) 1712.5 (19.8) 300 (3.5)   IV Piggyback 815 100   Total Intake(mL/kg) 2827.5 (32.7) 400 (4.6)   Urine (mL/kg/hr) 3975 (1.9)    Total Output 3975     Net -1147.5 +400          PHYSICAL EXAMINATION: General: NAD Neuro: No focal deficits HEENT: WNL Cardiovascular: Reg, no M Lungs: Clear Abdomen: BS x 4, soft, NT/ND.  Ext: No edema   LABS: I have reviewed all of today's lab results. 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  After 5:30 PM or weekends, call [phone]

## 2015-01-18 ENCOUNTER — Other Ambulatory Visit: Payer: Self-pay

## 2015-01-18 ENCOUNTER — Encounter: Payer: Self-pay | Admitting: Internal Medicine

## 2015-01-18 ENCOUNTER — Encounter: Payer: Self-pay | Admitting: Pulmonary Disease

## 2015-01-18 DIAGNOSIS — E872 Acidosis: Secondary | ICD-10-CM

## 2015-01-18 LAB — GLUCOSE, CAPILLARY
Glucose-Capillary: 130 mg/dL — ABNORMAL HIGH (ref 70–99)
Glucose-Capillary: 134 mg/dL — ABNORMAL HIGH (ref 70–99)

## 2015-01-18 MED ORDER — GABAPENTIN 300 MG PO CAPS: 600.0000 mg | ORAL_CAPSULE | Freq: Two times a day (BID) | ORAL | Status: DC

## 2015-01-18 MED ORDER — CYCLOPHOSPHAMIDE 50 MG PO CAPS
150.0000 mg | ORAL_CAPSULE | Freq: Every day | ORAL | Status: DC
  Filled 2015-01-18: qty 3

## 2015-01-18 MED ORDER — LEVETIRACETAM 500 MG PO TABS: 1000.0000 mg | ORAL_TABLET | Freq: Two times a day (BID) | ORAL | Status: DC

## 2015-01-18 MED ORDER — SULFAMETHOXAZOLE-TRIMETHOPRIM 400-80 MG PO TABS: 1.0000 | ORAL_TABLET | ORAL | Status: DC

## 2015-01-18 MED ORDER — HEPARIN SOD (PORK) LOCK FLUSH 100 UNIT/ML IV SOLN
500.0000 [IU] | INTRAVENOUS | Status: AC | PRN
  Administered 2015-01-18: 500 [IU]

## 2015-01-18 MED ORDER — CYCLOPHOSPHAMIDE 50 MG PO TABS
150.0000 mg | ORAL_TABLET | Freq: Every day | ORAL | Status: DC
  Administered 2015-01-18: 150 mg via ORAL
  Filled 2015-01-18: qty 3

## 2015-01-18 NOTE — Progress Notes (Signed)
Discharge instruction reviewed with patient. All questions answered at this time. Patient is requesting for a note to return back to work. No letter found in chart. MD paged.  Sim BoastHavy, RN

## 2015-01-18 NOTE — Progress Notes (Signed)
To whom it may concern:  Ms Brenda Mcguire has been hospitalized [facility] [place] from [place] to 01/18/15. She has recovered fully from this acute illness and may return to work at her discretion  Billy Fischeravid Emmer Lillibridge, MD ; PCCM service 01/18/15

## 2015-01-18 NOTE — Telephone Encounter (Signed)
Has already been filled one last time and pt is discharged

## 2015-01-18 NOTE — Telephone Encounter (Signed)
Pt left v/m requesting rx hydrocodone apap. Call when ready for pick up. Pt said Nicki Reaperegina Baity NP said she would refill hydrocodone until can get new PCP. Pt is out of med and wants to pick up today.Please advise.

## 2015-01-18 NOTE — Discharge Summary (Signed)
Physician Discharge Summary  Patient ID: Brenda Mcguire MRN: [id number] DOB/AGE: [birth year] 54 y.o.  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  Follow up with Neurology  Consider outpatient psychiatric counseling  Behcet's Disease  Immune Suppressed State  Discharge Plan: Continue Prednisone 10 mg QD + Cyclophosphamide  Continue Bactrim prophylaxis   Acute Respiratory Failure   Discharge Plan: Resolved, no acute follow up post discharge.                   DISCHARGE SUMMARY   Brenda Mcguire is a 54 y.o. y/o female with a PMH of GERD, HLD, chronic headaches, Behcet's disease, anxiety / depression, conversion disorder and seizures vs pseudo-seizures who presented to [facility] on 2/2 with concern for seizure like activity.  The patient was apparently at work when she had a seizure like episode and became unresponsive.  She was taken to the ER at [facility] where she continued to have like activity and was given multiple rounds of benzo's without any break. She was loaded with Dilantin and intubated for airway protection.  CT of the head demonstrated no acute process.  She was later transferred to [facility] for further evaluation and management.  The patient was admitted to ICU on mechanical ventilation.  She was initially treated with propofol and IV anti-epileptic drugs.  Neurology was consulted for evaluation.   She remained  intubated until 2/3 at which time she was successfully extubated to [place] O2. During admission, she was noted to have several episodes of seizure-like activity with no post ictal state.  EEG was completed on 2/2 and the patient was seen to have multiple events during the recording including twitching, shaking, extremity stiffening and unresponsiveness. No change in the background rhythm was noted with any of these events. EEG was dominated by diffusely distributed beta activity, most consistent with a medication effect.  Post extubation, the patient was informed of negative EEG at which time she became very emotional wanting to go home.  She was transferred out of ICU to the medical floor without further difficulties.  Patient is medically stable and cleared for discharge on 2/5 with instructions as above.   Of note, she has had multiple prior EEG's that reflect no documented seizure activity.  05/30/14   Continuous EEG >> did not record any clinical or subclinical seizures. There was recorded video evidence of right arm and right leg jerking; however, there was no EEG correlation with these events. These findings could represent simple partial seizures or other non-epileptic etiologies. 10/27/14  Routine EEG >> showed normal asleep EEG, no epileptiform activity noted.  The patients repeated behavior is very  concerning for medication abuse / malingering.              STUDIES/EVENTS:  2/02 Presented to [facility] with seizure like activity, intubated and transferred to [facility] 2/02 CT head: no acute process 2/02 Neurology consultation: rec continue anticonvulsant medications. EEG ordered 2/02 EEG: This electroencephalogram was dominated by diffusely distributed beta activity. This finding is most consistent with a medication effect. No epileptiform activity is noted. The patient had multiple events during the recording including twitching, shaking, extremity stiffening and unresponsiveness. No change in the  background rhythm was noted with any of these events 2/03 Extubated.  2/04 Continues to exhibit seizure-like events. No post-ictal state evident. Very emotional when it was explained ot her that her EEG does not show electrical evidence of seizure during the periods when she has apparent convulsions. Neurology re-iterated their impression that these are pseudoseizures. She expressed her desire to be be discharged to home. Her husband expressed extreme reservations about her going home. Transferred to med-surg floor   CONSULTS Neurology   TUBES / LINES OETT 2/2 >> 2/3     Discharge Exam: General: wdwn adult female in NAD  Neuro: AAOx4, speech clear, MAE CV: s1s2 rrr, no m/r/g PULM: resp's even/non-labored, lungs bilaterally clear GI: NTND, bsx4 active  Extremities: warm/dry, no edema   Filed Vitals:   01/17/15 1849 01/17/15 2136 01/18/15 0213 01/18/15 0620  BP: 124/75 117/72 108/66 121/71  Pulse: 97 86 74 66  Temp: 98.6 F (37 C) 98.3 F (36.8 C) 98 F (36.7 C) 98 F (36.7 C)  TempSrc: Oral Oral Oral Oral  Resp: 20 18 18 18   Weight:      SpO2: 99% 94% 94% 99%     Discharge Labs  BMET  Recent Labs Lab 01/16/15 0420 01/17/15 0600  NA 141 143  K 4.1 3.9  CL 112 108  CO2 25 26  GLUCOSE 149* 127*  BUN 7 <5*  CREATININE 0.70 0.63  CALCIUM 8.1* 8.6    CBC  Recent Labs Lab 01/16/15 0420 01/17/15 0600  HGB 10.0* 10.1*  HCT 30.1* 30.5*  WBC 7.1 7.0  PLT 243 257      Discharge Instructions    Call MD for:  difficulty breathing, headache or visual disturbances    Complete by:  As directed      Call MD for:  extreme fatigue    Complete by:  As directed      Call MD for:  hives    Complete by:  As directed      Call MD for:  persistant dizziness or light-headedness    Complete by:  As directed      Call MD for:  persistant nausea and vomiting    Complete by:  As directed      Call MD for:  temperature >100.4    Complete by:  As directed      Diet  general    Complete by:  As directed      Discharge instructions    Complete by:  As directed   1.  Review your medications carefully as they have changed 2.  Take your medications as prescribed 3.  Follow up with Neurology as previously arranged 4.  You will need to call your primary care physician for your maintenance medications / refills.     Increase activity slowly    Complete by:  As directed  Follow-up Information    Follow up with Webb Silversmith, NP.   Specialty:  Internal Medicine   Contact information:   [place] [address] [phone]       Follow up with Cameron Sprang, MD. Schedule an appointment as soon as possible for a visit in 1 week.   Specialty:  Neurology   Contact information:   [place] [address] [phone]          Medication List    TAKE these medications        ALPRAZolam 0.5 MG tablet  Commonly known as:  XANAX  Take 1 tablet (0.5 mg total) by mouth 3 (three) times daily as needed for anxiety.     amitriptyline 25 MG tablet  Commonly known as:  ELAVIL  Take 1 tablet (25 mg total) by mouth at bedtime.     atorvastatin 10 MG tablet  Commonly known as:  LIPITOR  Take 1 tablet (10 mg total) by mouth daily.     cyclophosphamide 50 MG tablet  Commonly known as:  CYTOXAN  Take 150 mg by mouth daily. Give on an empty stomach 1 hour before or 2 hours after meals.     divalproex 250 MG DR tablet  Commonly known as:  DEPAKOTE  Take 2 tablets (500 mg total) by mouth 3 (three) times daily.     ESTRACE VAGINAL 0.1 MG/GM vaginal cream  Generic drug:  estradiol  Place 1 Applicatorful vaginally at bedtime.     gabapentin 300 MG capsule  Commonly known as:  NEURONTIN  Take 2 capsules (600 mg total) by mouth 2 (two) times daily.     HYDROcodone-acetaminophen 5-325 MG per tablet  Commonly known as:  NORCO/VICODIN  Take 1 tablet by mouth 3 (three) times daily as needed  for moderate pain.     levETIRAcetam 500 MG tablet  Commonly known as:  KEPPRA  Take 2 tablets (1,000 mg total) by mouth 2 (two) times daily.     predniSONE 10 MG tablet  Commonly known as:  DELTASONE  Take 10 mg by mouth daily with breakfast.     promethazine 25 MG tablet  Commonly known as:  PHENERGAN  Take 25 mg by mouth every 6 (six) hours as needed for nausea or vomiting.     sulfamethoxazole-trimethoprim 400-80 MG per tablet  Commonly known as:  BACTRIM,SEPTRA  Take 1 tablet by mouth 3 (three) times a week.     topiramate 100 MG tablet  Commonly known as:  TOPAMAX  Take 1 tablet (100 mg total) by mouth daily.     zolpidem 10 MG tablet  Commonly known as:  AMBIEN  Take 10 mg by mouth at bedtime as needed for sleep.          Disposition:  Home, no new home health needs identified prior to discharge.   Discharged Condition: Emara Lichter has met maximum benefit of inpatient care and is medically stable and cleared for discharge.  Patient is pending follow up as above.      Time spent on disposition:  Greater than 35 minutes.   Signed: Noe Gens, NP-C [facility] Pgr: [phone] Office: [phone]    PCCM ATTENDING: The discharge plan was formulated by me after discussion with Dr Aram Beecham and been discussed with pt   Merton Border, MD;  PCCM service; Mobile [phone]

## 2015-01-18 NOTE — Progress Notes (Signed)
Upon rounding patient noted crying. Patient voices that her husband just involved in a MVA but per brother on the phone, everyone is fine at this time. Patient request can she leave at a later time. Emotional support given. Will continue to monitor.  Sim BoastHavy, RN

## 2015-01-21 ENCOUNTER — Telehealth: Payer: Self-pay | Admitting: Family Medicine

## 2015-01-21 ENCOUNTER — Encounter: Payer: Self-pay | Admitting: Internal Medicine

## 2015-01-21 DIAGNOSIS — R569 Unspecified convulsions: Secondary | ICD-10-CM

## 2015-01-21 NOTE — Addendum Note (Signed)
Addended by: Franciso BendMCNEIL, Nicholas Trompeter M on: 01/21/2015 09:15 AM   Modules accepted: Orders

## 2015-01-21 NOTE — Telephone Encounter (Signed)
-----   Message from Van ClinesKaren M Aquino, MD sent at 01/18/2015  5:24 PM EST ----- Regarding: video EEG referral Pls let her know I received records of recent hospitalization. I had discussed with her that if she has another seizure, the next step would be video EEG monitoring to find out where seizures are coming from. [facility]. Thanks!!

## 2015-01-21 NOTE — Telephone Encounter (Signed)
Left msg on voicemail to return my call [facility].    Patient returned my call. Did inform her that we would [facility] & she would receive a call from them once her records were reviewed.

## 2015-01-22 ENCOUNTER — Other Ambulatory Visit: Payer: Self-pay

## 2015-01-22 MED ORDER — HYDROCODONE-ACETAMINOPHEN 5-325 MG PO TABS: 1.0000 | ORAL_TABLET | Freq: Three times a day (TID) | ORAL | Status: DC | PRN

## 2015-01-22 NOTE — Telephone Encounter (Signed)
Rx left in front office for pick up and pt is aware  

## 2015-01-25 ENCOUNTER — Encounter: Payer: Self-pay | Admitting: Internal Medicine

## 2015-01-26 ENCOUNTER — Encounter: Payer: Self-pay | Admitting: Internal Medicine

## 2015-01-27 ENCOUNTER — Other Ambulatory Visit: Payer: Self-pay | Admitting: Internal Medicine

## 2015-01-29 ENCOUNTER — Encounter: Payer: Self-pay | Admitting: Internal Medicine

## 2015-01-30 ENCOUNTER — Encounter: Payer: Self-pay | Admitting: Internal Medicine

## 2015-01-31 MED ORDER — ALPRAZOLAM 0.5 MG PO TABS: 0.5000 mg | ORAL_TABLET | Freq: Three times a day (TID) | ORAL | Status: DC | PRN

## 2015-01-31 MED ORDER — AMITRIPTYLINE HCL 25 MG PO TABS: 25.0000 mg | ORAL_TABLET | Freq: Every day | ORAL | Status: DC

## 2015-01-31 NOTE — Telephone Encounter (Signed)
Rx called in to pharmacy. 

## 2015-04-01 ENCOUNTER — Other Ambulatory Visit: Payer: Self-pay | Admitting: Internal Medicine

## 2015-04-05 ENCOUNTER — Ambulatory Visit (INDEPENDENT_AMBULATORY_CARE_PROVIDER_SITE_OTHER): Payer: Federal, State, Local not specified - PPO | Admitting: Family Medicine

## 2015-04-05 ENCOUNTER — Emergency Department (HOSPITAL_COMMUNITY): Payer: Federal, State, Local not specified - PPO

## 2015-04-05 ENCOUNTER — Encounter (HOSPITAL_COMMUNITY): Payer: Self-pay | Admitting: *Deleted

## 2015-04-05 ENCOUNTER — Emergency Department (HOSPITAL_COMMUNITY)
Admission: EM | Admit: 2015-04-05 | Discharge: 2015-04-05 | Disposition: A | Discharge status: Home / Self Care | Payer: Federal, State, Local not specified - PPO | Attending: Emergency Medicine | Admitting: Emergency Medicine

## 2015-04-05 VITALS — BP 100/64 | HR 101 | Temp 98.0°F | Resp 20 | Ht 68.0 in | Wt 198.5 lb

## 2015-04-05 DIAGNOSIS — Z86010 Personal history of colonic polyps: Secondary | ICD-10-CM | POA: Insufficient documentation

## 2015-04-05 DIAGNOSIS — R42 Dizziness and giddiness: Secondary | ICD-10-CM | POA: Diagnosis not present

## 2015-04-05 DIAGNOSIS — Z7982 Long term (current) use of aspirin: Secondary | ICD-10-CM | POA: Diagnosis not present

## 2015-04-05 DIAGNOSIS — I252 Old myocardial infarction: Secondary | ICD-10-CM | POA: Diagnosis not present

## 2015-04-05 DIAGNOSIS — Z8739 Personal history of other diseases of the musculoskeletal system and connective tissue: Secondary | ICD-10-CM | POA: Diagnosis not present

## 2015-04-05 DIAGNOSIS — R079 Chest pain, unspecified: Secondary | ICD-10-CM | POA: Diagnosis not present

## 2015-04-05 DIAGNOSIS — Z8719 Personal history of other diseases of the digestive system: Secondary | ICD-10-CM | POA: Diagnosis not present

## 2015-04-05 DIAGNOSIS — E785 Hyperlipidemia, unspecified: Secondary | ICD-10-CM | POA: Insufficient documentation

## 2015-04-05 DIAGNOSIS — Z79899 Other long term (current) drug therapy: Secondary | ICD-10-CM | POA: Insufficient documentation

## 2015-04-05 DIAGNOSIS — R0600 Dyspnea, unspecified: Secondary | ICD-10-CM | POA: Diagnosis not present

## 2015-04-05 DIAGNOSIS — F419 Anxiety disorder, unspecified: Secondary | ICD-10-CM

## 2015-04-05 DIAGNOSIS — Z87442 Personal history of urinary calculi: Secondary | ICD-10-CM | POA: Diagnosis not present

## 2015-04-05 DIAGNOSIS — R4182 Altered mental status, unspecified: Secondary | ICD-10-CM | POA: Insufficient documentation

## 2015-04-05 DIAGNOSIS — G40309 Generalized idiopathic epilepsy and epileptic syndromes, not intractable, without status epilepticus: Secondary | ICD-10-CM | POA: Insufficient documentation

## 2015-04-05 DIAGNOSIS — R4781 Slurred speech: Secondary | ICD-10-CM

## 2015-04-05 DIAGNOSIS — Z7952 Long term (current) use of systemic steroids: Secondary | ICD-10-CM | POA: Diagnosis not present

## 2015-04-05 DIAGNOSIS — R531 Weakness: Secondary | ICD-10-CM | POA: Diagnosis present

## 2015-04-05 DIAGNOSIS — Z8679 Personal history of other diseases of the circulatory system: Secondary | ICD-10-CM | POA: Insufficient documentation

## 2015-04-05 LAB — COMPREHENSIVE METABOLIC PANEL
ALT: 17 U/L (ref 0–35)
AST: 23 U/L (ref 0–37)
Albumin: 3.9 g/dL (ref 3.5–5.2)
Alkaline Phosphatase: 109 U/L (ref 39–117)
Anion gap: 10 (ref 5–15)
BUN: 15 mg/dL (ref 6–23)
CALCIUM: 9.6 mg/dL (ref 8.4–10.5)
CO2: 24 mmol/L (ref 19–32)
Chloride: 107 mmol/L (ref 96–112)
Creatinine, Ser: 0.89 mg/dL (ref 0.50–1.10)
GFR calc non Af Amer: 74 mL/min — ABNORMAL LOW (ref >90)
GFR, EST AFRICAN AMERICAN: 86 mL/min — AB (ref >90)
Glucose, Bld: 108 mg/dL — ABNORMAL HIGH (ref 70–99)
Potassium: 4.3 mmol/L (ref 3.5–5.1)
Sodium: 141 mmol/L (ref 135–145)
Total Bilirubin: 0.2 mg/dL — ABNORMAL LOW (ref 0.3–1.2)
Total Protein: 6.9 g/dL (ref 6.0–8.3)

## 2015-04-05 LAB — URINALYSIS, ROUTINE W REFLEX MICROSCOPIC
Bilirubin Urine: NEGATIVE
Glucose, UA: NEGATIVE mg/dL
Hgb urine dipstick: NEGATIVE
Ketones, ur: NEGATIVE mg/dL
NITRITE: NEGATIVE
Protein, ur: NEGATIVE mg/dL
Specific Gravity, Urine: 1.006 (ref 1.005–1.030)
Urobilinogen, UA: 1 mg/dL (ref 0.0–1.0)
pH: 7.5 (ref 5.0–8.0)

## 2015-04-05 LAB — I-STAT TROPONIN, ED: Troponin i, poc: 0 ng/mL (ref 0.00–0.08)

## 2015-04-05 LAB — CBC
HCT: 36.5 % (ref 36.0–46.0)
Hemoglobin: 12.1 g/dL (ref 12.0–15.0)
MCH: 30.8 pg (ref 26.0–34.0)
MCHC: 33.2 g/dL (ref 30.0–36.0)
MCV: 92.9 fL (ref 78.0–100.0)
Platelets: 200 10*3/uL (ref 150–400)
RBC: 3.93 MIL/uL (ref 3.87–5.11)
RDW: 12.6 % (ref 11.5–15.5)
WBC: 5.6 10*3/uL (ref 4.0–10.5)

## 2015-04-05 LAB — URINE MICROSCOPIC-ADD ON

## 2015-04-05 LAB — RAPID URINE DRUG SCREEN, HOSP PERFORMED
Amphetamines: NOT DETECTED
Barbiturates: NOT DETECTED
Benzodiazepines: POSITIVE — AB
COCAINE: NOT DETECTED
OPIATES: NOT DETECTED
Tetrahydrocannabinol: NOT DETECTED

## 2015-04-05 LAB — DIFFERENTIAL
Basophils Absolute: 0 10*3/uL (ref 0.0–0.1)
Basophils Relative: 0 % (ref 0–1)
Eosinophils Absolute: 0.3 10*3/uL (ref 0.0–0.7)
Eosinophils Relative: 5 % (ref 0–5)
LYMPHS PCT: 45 % (ref 12–46)
Lymphs Abs: 2.5 10*3/uL (ref 0.7–4.0)
MONOS PCT: 7 % (ref 3–12)
Monocytes Absolute: 0.4 10*3/uL (ref 0.1–1.0)
NEUTROS ABS: 2.4 10*3/uL (ref 1.7–7.7)
Neutrophils Relative %: 43 % (ref 43–77)

## 2015-04-05 LAB — I-STAT CHEM 8, ED
BUN: 15 mg/dL (ref 6–23)
CREATININE: 0.9 mg/dL (ref 0.50–1.10)
Calcium, Ion: 1.2 mmol/L (ref 1.12–1.23)
Chloride: 107 mmol/L (ref 96–112)
Glucose, Bld: 108 mg/dL — ABNORMAL HIGH (ref 70–99)
HEMATOCRIT: 38 % (ref 36.0–46.0)
HEMOGLOBIN: 12.9 g/dL (ref 12.0–15.0)
POTASSIUM: 4.2 mmol/L (ref 3.5–5.1)
SODIUM: 142 mmol/L (ref 135–145)
TCO2: 21 mmol/L (ref 0–100)

## 2015-04-05 LAB — CBG MONITORING, ED: GLUCOSE-CAPILLARY: 99 mg/dL (ref 70–99)

## 2015-04-05 MED ORDER — ONDANSETRON HCL 4 MG/2ML IJ SOLN
4.0000 mg | Freq: Once | INTRAMUSCULAR | Status: AC
  Administered 2015-04-05: 4 mg via INTRAVENOUS
  Filled 2015-04-05: qty 2

## 2015-04-05 NOTE — ED Notes (Signed)
[facility] doctor[facility]mber contacted over the phone to let her know, that mom is been dc of the emergency room that some one has to come and pick her up. Pt is going to be move to pod c for pt safety, pt very drowsy and unable to ambulate by her self.

## 2015-04-05 NOTE — Progress Notes (Addendum)
Subjective:    Patient ID: Brenda Mcguire, female    DOB: 05-11-1961, 54 y.o.   MRN: [id number] This chart was scribed for Meredith Staggers, MD by Chestine Spore, ED Scribe. The patient was seen in room 10 at 2:41 PM.   Chief Complaint  Patient presents with  . Depression    husband died yesterday    HPI  Sherrell Weir is a 54 y.o. female with a medical hx of seizure disorder, behcet's dx, who presents today complaining of depression onset yesterday. Pt notes that she has not been feeling well the past couple days. Pt notes that her husband died unexpectedly yesterday. She has not been sleeping and she has been on edge. Pt had pain in her mid-chest today for 2 hours that wont go away and is a dull pressure. Pt reports that the pain is going into her neck but not into her arm. She notes that when she is moving around she is SOB and dizzy. Pt notes that her mouth is dry and it is hard for her to get the words out and it occurred today. Pt can force the words out and she knows what she is trying to say but she relates her symptoms to having "cotton mouth." Pt took 1 xanax last night but none today. Pt has not taken her Ambien in months. Pt does not drink alcohol. Pt thinks that her symptoms are due to stress. Pt reports that she hasn't sleep in 48 hours because of her husband dying. Pt is in-between primary care at this point. Pt has not had her xanax filled for awhile.   She states that she is having associated symptoms of SOB, dizziness, CP, nausea, and change in speech. Pt thinks that her slurred speech happened within the past 3 hours but she is unsure of this. Pt reports that she didn't wake up with slurred speech. She denies vomiting and any other symptoms. Pt has been able to keep her seizure medicines down. Pt has had NSTEMI in 10/2014 with no cathetherization. Pt had an echo in 10/2014 as well. Nl wall motion, EF of 55-60%. Carotid dopplers at the same time in 10/28/2014 with 1-39% stenosis of  right internal carotid and left internal carotid. Right vertebral artery with atypical flow. Pt was seen by a cardiologist at that time. Pt notices that her heart will began to flutter and then it will resume her normal rhythm. Pt reports that her ankles have began to swell and they started yesterday and they have never done that before. Pt notes that she has a med port is for chemotherapy purposes. Pt states that her daughters were the ones who convinced her to come in today.  Per Pt medical records: Pt is [facility] by [facility]. Other medical problems per problem list: hx of anxiety which she is Rx xanax 0.5 mg TID PRN, #90 Rx 01/31/15, Ambien 10 mg in the past for sleep, .   Patient Active Problem List   Diagnosis Date Noted  . Pseudoseizures   . Acute respiratory acidosis   . Acute respiratory failure with hypoxia 10/28/2014  . NSTEMI (non-ST elevated myocardial infarction) 10/28/2014  . Seizure 10/27/2014  . Vision loss of right eye 10/23/2014  . Blurred vision 10/23/2014  . Acute loss of vision 10/23/2014  . Unequal pupils   . Status epilepticus 08/31/2014  . Acute respiratory failure 08/31/2014  . GERD (gastroesophageal reflux disease) 08/25/2013  . Insomnia 03/02/2013  . Seizures 03/02/2013  .  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 GERD (gastroesophageal reflux disease)   . Hyperlipidemia   . History of blood transfusion   . Chronic headaches   . Phlebitis   . History of colonic polyps   . 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 Tonic clonic seizures     Pseudo-Seizure  . Anxiety     "situational stress"  . Kidney stones [id number]   Past Surgical History  Procedure Laterality Date  . Cholecystectomy    . Tonsillectomy  1970  . Appendectomy  1975  . Lumbar fusion  1999    L5-S1  . Abdominal hysterectomy  1988  . Portacath placement Left 2008  . Stone extraction with basket    . Back surgery      L5-S1 ALIF   . Adenoidectomy  at 54 years old  . Oophorectomy  1987  . Radiology with anesthesia N/A 10/24/2014    Procedure: RADIOLOGY WITH ANESTHESIA;  Surgeon: Medication Radiologist, MD;  Location: [facility] OR;  Service: Radiology;  Laterality: N/A;   Allergies  Allergen Reactions  . Hepatitis B Vaccine Hives and Shortness Of Breath    Causes wheezing   . Imitrex [Sumatriptan] Other (See Comments)    Makes her have SVT and chest pain  . Iohexol Anaphylaxis, Hives and Shortness Of Breath  . Codeine Itching and Rash  . Keppra Xr [Levetiracetam] Nausea Only   Prior to Admission medications   Medication Sig Start Date End Date Taking? Authorizing Provider  aspirin 81 MG tablet Take 81 mg by mouth daily.   Yes Historical Provider, MD  atorvastatin (LIPITOR) 10 MG tablet Take 1 tablet (10 mg total) by mouth daily. 01/03/15  Yes Lorre Munroeegina W Baity, NP  divalproex (DEPAKOTE) 250 MG DR tablet Take 2 tablets (500 mg total) by mouth 3 (three) times daily. Patient taking differently: Take 750 mg by mouth 3 (three) times daily.  09/02/14  Yes Jeanella CrazeBrandi L Ollis, NP  gabapentin (NEURONTIN) 300 MG capsule Take 2 capsules (600 mg total) by mouth 2 (two) times daily. 01/18/15  Yes Jeanella CrazeBrandi L Ollis, NP  hydroxychloroquine (PLAQUENIL) 200 MG tablet Take 200 mg by mouth 2 (two) times daily.   Yes Historical Provider, MD  levETIRAcetam (KEPPRA) 500 MG tablet Take 2 tablets (1,000 mg total) by mouth 2 (two) times daily. 01/18/15  Yes Jeanella CrazeBrandi L Ollis, NP  predniSONE (DELTASONE) 10 MG tablet Take 10 mg by mouth daily with breakfast.   Yes Historical Provider, MD  topiramate (TOPAMAX) 100 MG tablet Take 1 tablet (100 mg total) by mouth daily. 01/03/15  Yes Van ClinesKaren M Aquino, MD  ALPRAZolam Prudy Feeler(XANAX) 0.5 MG tablet Take 1 tablet (0.5 mg total) by mouth 3 (three) times daily as needed for anxiety. Patient not taking: Reported on 04/05/2015 01/31/15   Lorre Munroeegina W Baity, NP  amitriptyline (ELAVIL) 25 MG tablet Take 1 tablet (25 mg total) by mouth at  bedtime. Patient not taking: Reported on 04/05/2015 01/31/15   Lorre Munroeegina W Baity, NP  cyclophosphamide (CYTOXAN) 50 MG tablet Take 150 mg by mouth daily. Give on an empty stomach 1 hour before or 2 hours after meals.    Historical Provider, MD  ESTRACE VAGINAL 0.1 MG/GM vaginal cream Place 1 Applicatorful vaginally at bedtime.  12/05/14   Historical Provider, MD  HYDROcodone-acetaminophen (NORCO/VICODIN) 5-325 MG per tablet Take 1 tablet by mouth 3 (three) times daily as needed for moderate pain. Patient not taking: Reported on 04/05/2015 01/22/15  Lorre Munroe, NP  promethazine (PHENERGAN) 25 MG tablet Take 25 mg by mouth every 6 (six) hours as needed for nausea or vomiting.  01/03/15   Historical Provider, MD  sulfamethoxazole-trimethoprim (BACTRIM,SEPTRA) 400-80 MG per tablet Take 1 tablet by mouth 3 (three) times a week. Patient not taking: Reported on 04/05/2015 01/18/15   Jeanella Craze, NP  zolpidem (AMBIEN) 10 MG tablet Take 10 mg by mouth at bedtime as needed for sleep.  01/13/15   Historical Provider, MD   History   Social History  . Marital Status: Widowed    Spouse Name: N/A  . Number of Children: 2  . Years of Education: 14   Occupational History  . Nurse     [employer]  .     Social History Main Topics  . Smoking status: Never Smoker   . Smokeless tobacco: Never Used  . Alcohol Use: No  . Drug Use: No  . Sexual Activity: Yes    Birth Control/ Protection: Surgical   Other Topics Concern  . Not on file   Social History Narrative   Lives with husband.  They have 2 grown children.   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   Cardiovascular: Positive for chest pain.  Gastrointestinal: Positive for nausea. Negative for vomiting.  Neurological: Positive for dizziness and  speech difficulty.       Objective:   Physical Exam  Constitutional: She is oriented to person, place, and time. She appears well-developed and well-nourished. No distress.  HENT:  Head: Normocephalic and atraumatic.  Eyes: EOM are normal. Pupils are equal, round, and reactive to light.  Neck: Neck supple. No tracheal deviation present.  Cardiovascular: Regular rhythm.  Tachycardia present.   Murmur heard.  Systolic murmur is present  Pulmonary/Chest: Effort normal. No respiratory distress.  Musculoskeletal: Normal range of motion.  Neurological: She is alert and oriented to person, place, and time.  Equal grip strength bilaterally upper and lower extremities. Unsteady with standing and requires assistance to stand. No apparent pronator drift but unsteady and requires assistance to stand. Unsteady with gait, requires assistance. Slight slurring with quote "You can't teach an old dog new tricks."  Skin: Skin is warm and dry.  Psychiatric: Her affect is blunt. Her speech is slurred. She is slowed.  Tearful at times of initial history.   Nursing note and vitals reviewed.       Filed Vitals:   04/05/15 1417  BP: 100/64  Pulse: 101  Temp: 98 F (36.7 C)  TempSrc: Oral  Resp: 20  Height:  (1.727 m)  Weight: 198 lb 8 oz (90.039 kg)  SpO2: 93%    Assessment & Plan:  3:00 PM- EMS called for emergent transport to hospital for the pt symptoms.   3:06 PM- Attempted to obtained EKG and place O2 but pt going to restroom with assistance.   3:09 PM- EMS arrived to transport pt to hospital for her symptoms. Report given to EMS with transfer of care.  3:16 PM- Pt left with EMS to be transported to hospital.  3:17 PM- Pt checked in approximately 1:45 PM and in discussion with intake staff, pt had her head covered and tearful but otherwise appeared to be communicating normally.   3:20  PM- Charge nurse at [facility] advised of pt coming via EMS.  Sudiksha Victor is a 54 y.o.  female Chest pain, unspecified chest pain type,  Dyspnea on exertion, History of non-ST elevation myocardial infarction (NSTEMI)  - see below.  Unable to obtain EKG prior to transport, but report given to EMS about her 2-3 hours of substernal chest pain, dyspnea on exertion and reported hx of NSTEMI. In review of prior record - it appears she had elevated troponins from demand ischemia in 10/2014 hospitlalization.    Slurred speech, Dizziness  -Initially presented with depression symptoms after sudden loss of her husband yesterday, however on my evaluation - she was noted to have slowed, slurred speech.  She noted that it was difficult to make words, but unable to tell what time this started.  Did not experience these sx's this morning, and not apparently noted on presentation to our office when she signed in at 1:45 pm.  No other focal weakness appreciated, but unsteady with standing which limited further testing. Denied any extra doses of meds today, and last xanax dose last night. Denied alcohol or other sedating or new psychotropic substances. Noted history of seizure vs pseudoseizures, but denied any seizure activity recently.   -EMS called for transport, unable to obtain IV or monitor prior to their arrival.  Charge nurse at [facility] advised.  Patient responsive to questions throughout office visit.     There are no Patient Instructions on file for this visit.  I personally performed the services described in this documentation, which was scribed in my presence. The recorded information has been reviewed and considered, and addended by me as needed.

## 2015-04-05 NOTE — Consult Note (Signed)
Stroke Consult    Chief Complaint: slurred speech   HPI: Brenda Mcguire is an 54 y.o. female hx of HLD, HTN, Bechets, seizure and pseudoseizures presenting for acute onset of slurred speech and right sided weakness. Patient reports LSW 0900 when she was with her family. Went to PCP at 1400 after feeling overwhelmed and stressed. At PCP noted to have confusion and slurred speech. Unclear onset of symptoms. Upon EMS arrival, BP noted to be 120/60 with CBG of 129.   Of note, patients husband died 2 days ago and she reports having not slept in 48hrs. Notes taking a xanax and norco today, denies any other medication. CT head imaging reviewed and overall unremarkable.   Date last known well: 04/05/2015 Time last known well: 0900 tPA Given: no, outside of tPA window. Initial NIHSS of 7 though inconsistent exam  Past Medical History  Diagnosis Date  . GERD (gastroesophageal reflux disease)   . Hyperlipidemia   . History of blood transfusion   . Chronic headaches   . Phlebitis   . History of colonic polyps   . 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 Tonic clonic seizures     Pseudo-Seizure  . Anxiety     "situational stress"  . Kidney stones 9562,1308    Past Surgical History  Procedure Laterality Date  . Cholecystectomy    . Tonsillectomy  1970  . Appendectomy  1975  . Lumbar fusion  1999    L5-S1  . Abdominal hysterectomy  1988  . Portacath placement Left 2008  . Stone extraction with basket    . Back surgery      L5-S1 ALIF  . Adenoidectomy  at 54 years old  . Oophorectomy  1987  . Radiology with anesthesia N/A 10/24/2014    Procedure: RADIOLOGY WITH ANESTHESIA;  Surgeon: Medication Radiologist, MD;  Location: [facility] OR;  Service: Radiology;  Laterality: N/A;    Family History  Problem Relation Age of Onset  . Diabetes Mother   . Hyperlipidemia Mother   . Stroke Mother   . Hypertension Mother   . Colon cancer Mother   . Breast cancer Mother   . Early death Father   . Heart  attack Father   . Heart disease Father   . Hyperlipidemia Father   . COPD Sister    Social History:  reports that she has never smoked. She has never used smokeless tobacco. She reports that she does not drink alcohol or use illicit drugs.  Allergies:  Allergies  Allergen Reactions  . Hepatitis B Vaccine Hives and Shortness Of Breath    Causes wheezing   . Imitrex [Sumatriptan] Other (See Comments)    Makes her have SVT and chest pain  . Iohexol Anaphylaxis, Hives and Shortness Of Breath  . Codeine Itching and Rash  . Keppra Xr [Levetiracetam] Nausea Only     (Not in a hospital admission)  ROS: Out of a complete 14 system review, the patient complains of only the following symptoms, and all other reviewed systems are negative. +slurred speech   Physical Examination: Filed Vitals:   04/05/15 1605  BP:   Pulse: 99  Resp: 15   Physical Exam  Constitutional: He appears well-developed and well-nourished.  Psych: Affect appropriate to situation Eyes: No scleral injection HENT: No OP obstrucion Head: Normocephalic.  Cardiovascular: Normal rate and regular rhythm.  Respiratory: Effort normal and breath sounds normal.  GI: Soft. Bowel sounds are normal. No distension. There is no tenderness.  Skin: WDI  Neurologic Examination: Mental Status: Alert, oriented to name, location, answers "May" for date, thought content appropriate.  Speech fluent without evidence of aphasia. Mild dysarthria.  Able to follow 3 step commands without difficulty. Cranial Nerves: II: optic discs not visualized, visual fields grossly normal, pupils equal, round, reactive to light and accommodation III,IV, VI: ptosis not present, extra-ocular motions intact bilaterally V,VII: smile symmetric, facial light touch sensation normal bilaterally VIII: hearing normal bilaterally IX,X: gag reflex present XI: trapezius strength/neck flexion strength normal bilaterally XII: tongue strength normal   Motor: Not formally following commands but appears to move all extremities against gravity and light resistance. Subject weakness in RLE though appears effort dependent Tone and bulk:normal tone throughout; no atrophy noted Sensory: Pinprick and light touch intact throughout, bilaterally Deep Tendon Reflexes: 2+ and symmetric throughout Plantars: Right: downgoing   Left: downgoing Cerebellar: normal finger-to-nose, mild ataxia with HTS on the right Gait: deferred  Laboratory Studies:   Basic Metabolic Panel: No results for input(s): NA, K, CL, CO2, GLUCOSE, BUN, CREATININE, CALCIUM, MG, PHOS in the last 168 hours.  Liver Function Tests: No results for input(s): AST, ALT, ALKPHOS, BILITOT, PROT, ALBUMIN in the last 168 hours. No results for input(s): LIPASE, AMYLASE in the last 168 hours. No results for input(s): AMMONIA in the last 168 hours.  CBC: No results for input(s): WBC, NEUTROABS, HGB, HCT, MCV, PLT in the last 168 hours.  Cardiac Enzymes: No results for input(s): CKTOTAL, CKMB, CKMBINDEX, TROPONINI in the last 168 hours.  BNP: Invalid input(s): POCBNP  CBG: No results for input(s): GLUCAP in the last 168 hours.  Microbiology: Results for orders placed or performed during the hospital encounter of 01/15/15  MRSA PCR Screening     Status: None   Collection Time: 01/15/15  3:37 PM  Result Value Ref Range Status   MRSA by PCR NEGATIVE NEGATIVE Final    Comment:        The GeneXpert MRSA Assay (FDA approved for NASAL specimens only), is one component of a comprehensive MRSA colonization surveillance program. It is not intended to diagnose MRSA infection nor to guide or monitor treatment for MRSA infections.     Coagulation Studies: No results for input(s): LABPROT, INR in the last 72 hours.  Urinalysis: No results for input(s): COLORURINE, LABSPEC, PHURINE, GLUCOSEU, HGBUR, BILIRUBINUR, KETONESUR, PROTEINUR, UROBILINOGEN, NITRITE, LEUKOCYTESUR in the last  168 hours.  Invalid input(s): APPERANCEUR  Lipid Panel:     Component Value Date/Time   CHOL 234* 03/02/2013 1115   TRIG 102 01/16/2015 0420   HDL 45.90 03/02/2013 1115   CHOLHDL 5 03/02/2013 1115   VLDL 16.6 03/02/2013 1115    HgbA1C:  Lab Results  Component Value Date   HGBA1C 5.8* 10/23/2014    Urine Drug Screen:     Component Value Date/Time   LABOPIA NONE DETECTED 10/27/2014 0708   LABOPIA NEGATIVE 11/11/2013 0600   COCAINSCRNUR NONE DETECTED 10/27/2014 0708   COCAINSCRNUR NEGATIVE 11/11/2013 0600   LABBENZ POSITIVE* 10/27/2014 0708   LABBENZ POSITIVE* 11/11/2013 0600   AMPHETMU NONE DETECTED 10/27/2014 0708   AMPHETMU NEGATIVE 11/11/2013 0600   THCU NONE DETECTED 10/27/2014 0708   LABBARB NONE DETECTED 10/27/2014 0708    Alcohol Level: No results for input(s): ETH in the last 168 hours.  Imaging: Ct Head Wo Contrast  04/05/2015   CLINICAL DATA:  Code stroke.  Slurred speech.  EXAM: CT HEAD WITHOUT CONTRAST  TECHNIQUE: Contiguous axial images were obtained from the base of  the skull through the vertex without intravenous contrast.  COMPARISON:  01/16/2015  FINDINGS: Ventricle size is normal. Negative for acute infarct. Negative for hemorrhage or mass. No edema or shift of the midline structures.  Calvarium intact.  IMPRESSION: Negative  Critical Value/emergent results were called by telephone at the time of interpretation on 04/05/2015 at 4:06 pm to Dr. Elspeth Cho , who verbally acknowledged these results.   Electronically Signed   By: Marlan Palau M.D.   On: 04/05/2015 16:06    Assessment: 54 y.o. female hx of HLD, HTN, Bechets, seizures and pseudoseizures presenting with acute onset of slurred speech and subjective right sided weakness. Exam findings inconsistent. Atypical presentation but cannot rule out CVA. Differential would also include polypharmacy vs psych/anxiety   Plan: 1.Check MRI brain without contrast 2. Check CMP, UDS 3. If MRI unremarkable no  further neurological workup   Elspeth Cho, DO [facility] [phone]  If 7pm- 7am, please page neurology on call as listed in AMION. 04/05/2015, 4:19 PM

## 2015-04-05 NOTE — ED Notes (Signed)
[facility] in [facility] from [facility], per report pt was being seen for CP & started to have slurred speech onset today @ 14:00, pts CBG 129 pta, pt ST in route, #20 R wrist

## 2015-04-05 NOTE — Discharge Instructions (Signed)
Generalized Anxiety Disorder Generalized anxiety disorder (GAD) is a mental disorder. It interferes with life functions, including relationships, work, and school. GAD is different from normal anxiety, which everyone experiences at some point in their lives in response to specific life events and activities. Normal anxiety actually helps us prepare for and get through these life events and activities. Normal anxiety goes away after the event or activity is over.  GAD causes anxiety that is not necessarily related to specific events or activities. It also causes excess anxiety in proportion to specific events or activities. The anxiety associated with GAD is also difficult to control. GAD can vary from mild to severe. People with severe GAD can have intense waves of anxiety with physical symptoms (panic attacks).  SYMPTOMS The anxiety and worry associated with GAD are difficult to control. This anxiety and worry are related to many life events and activities and also occur more days than not for 6 months or longer. People with GAD also have three or more of the following symptoms (one or more in children):  Restlessness.   Fatigue.  Difficulty concentrating.   Irritability.  Muscle tension.  Difficulty sleeping or unsatisfying sleep. DIAGNOSIS GAD is diagnosed through an assessment by your health care provider. Your health care provider will ask you questions aboutyour mood,physical symptoms, and events in your life. Your health care provider may ask you about your medical history and use of alcohol or drugs, including prescription medicines. Your health care provider may also do a physical exam and blood tests. Certain medical conditions and the use of certain substances can cause symptoms similar to those associated with GAD. Your health care provider may refer you to a mental health specialist for further evaluation. TREATMENT The following therapies are usually used to treat GAD:    Medication. Antidepressant medication usually is prescribed for long-term daily control. Antianxiety medicines may be added in severe cases, especially when panic attacks occur.   Talk therapy (psychotherapy). Certain types of talk therapy can be helpful in treating GAD by providing support, education, and guidance. A form of talk therapy called cognitive behavioral therapy can teach you healthy ways to think about and react to daily life events and activities.  Stress managementtechniques. These include yoga, meditation, and exercise and can be very helpful when they are practiced regularly. A mental health specialist can help determine which treatment is best for you. Some people see improvement with one therapy. However, other people require a combination of therapies. Document Released: 03/27/2013 Document Revised: 04/16/2014 Document Reviewed: 03/27/2013 ExitCare Patient Information 2015 [employer]. This information is not intended to replace advice given to you by your health care provider. Make sure you discuss any questions you have with your health care provider.  

## 2015-04-05 NOTE — H&P (Signed)
PATIENT NAME:  Brenda MannanOSBORNE, Elizette MR#:  [id number] DATE OF BIRTH:  22-Sep-1961  DATE OF ADMISSION:  08/25/2013  ADDENDUM TO HISTORY AND PHYSICAL  I was called by a nurse who reported the patient has recurrent tonic-clonic seizures.  On arrival to her room the patient is actively seizing. Her arms are clenched and difficult to obtain access to IV line flushes which is in her chest.   We are able to prop open up her eyes. She reacts to corneal touch. She also reacts to fluids and normal saline fluids being dripped on the corneas.   It is more concerning. We believe that this is a little bit more concerning for super-seizures,  and we asked Dr. Carollee MassedKaminski, Emergency Room physician, to [facility] since we are having difficulty getting neurology coverage here in the hospital on Fridays as well as well as on the weekends. We also do not that have to monitor this patient with a continuous electroencephalogram, so we felt that this patient would not be well-served if she is not in a tertiary care center where they know her.  Dr. Carollee MassedKaminski was gracious to call [facility] neurology, and they accepted the patient, however there are no beds today. The patient was placed on a [facility] list [facility], so at this point we will need to continue Keppra as well as Dilantin, and we will admit the patient to the step-down intensive care unit for observation aware while awaiting [facility].   Time spent: Approximately 40 minutes.    ____________________________ Katharina Caperima Lekeisha Arenas, MD rv:dm D: 08/25/2013 16:17:04 ET T: 08/25/2013 19:32:22 ET JOB#: [id number]  cc: Katharina Caperima Lyna Laningham, MD, <Dictator> Caedyn Tassinari MD ELECTRONICALLY SIGNED 09/11/2013 9:38

## 2015-04-05 NOTE — ED Notes (Signed)
Per EMS Pts husband reported to have unexpectedly pass away 2 days ago

## 2015-04-05 NOTE — Code Documentation (Signed)
54yo [facility]49.  Patient went to an urgent care today because she reports that her husband died two days ago and she could no longer handle the stress.  EMS reports right sided weakness and slurred speech.  Code stroke activated by EMS.  Stroke team at the bedside on arrival.  Patient cleared by Dr. Jeraldine LootsLockwood and taken to CT.  NIHSS 7, see documentation for details and code stroke times.  Patient is also c/o chest pain and dizziness.  Patient reports that she has been alone today since 0900 when her daughter came over.  She is unaware of the time that her symptoms started.  LKW 0900.  Of note, patient also reports taking Xanax and Norco to help her sleep as she has reportedly not slept in 48 hours.  Dr. Hosie PoissonSumner at the bedside for exam.  Patient is outside the window for treatment with tPA.  No acute stroke treatment at this time.  Code stroke canceled.  Bedside handoff with ED RN Toniann FailWendy.

## 2015-04-05 NOTE — ED Notes (Signed)
Brenda Mcguire, phlebotomy unable to obtain blood draw, Brenda Mcguire Phlebotomy at bedside attempting blood draws

## 2015-04-05 NOTE — H&P (Signed)
PATIENT NAME:  Brenda Mcguire, HOUCHEN MR#:  [id number] DATE OF BIRTH:  1961-09-03  PRIMARY CARE PHYSICIAN: Unknown.  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   Apparently, the patient had 3 witnessed seizures at work and she had also 2 more when EMS arrived. The seizures apparently were not followed by regaining of consciousness and were  continuous despite intervention besides Ativan therapy here in the Emergency Room.   The patient was paralyzed and she was intubated. Shortly after intubation, she woke up and she requested the tube to be taken out. She was extubated. Postextubation, she was noted to have mild stridor with some respiratory distress. She was given racemic epinephrine as well as lidocaine . She was sent for chest x-ray which was unremarkable.   Now she feels comfortable and hospitalist services were contacted for admission.   While talking to the patient, she remembers feeling somewhat strange, feeling uncomfortable at work and lying down. She admits of having some lightheadedness and dizziness, feeling  presyncopal. She tells me that she usually feels that her seizures are coming up because usually the feeling comes as if everything is moving in slow motion including voices. Then she passed out and she does not remember much more, but when she woke up, she had some minimal headaches.   Now, here in the Emergency Room, she is complaining of chest pain which she describes as pressure-type pain radiating to her left jaw. It has been there now for 30 minutes. She admits of having some chills yesterday and the nausea and vomiting was just prior to seizure episodes.   She tells me that her last seizures were approximately 4 months ago and only 1 single episode and she did not  need to come to the Emergency Room.   PAST MEDICAL HISTORY: Significant for history of Behcet disease, questionable seizures as well as possible pseudoseizures evaluated numerous times at [facility]. 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 Was hospitalized at [facility] in April 2011 with new diagnosis of seizures. She had several EEGs from April 19 to 22 without evidence of seizure. 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   PAST SURGICAL HISTORY: According to medical records, spinal surgery of L5-S1, appendectomy, cholecystectomy, hysterectomy.   MEDICATIONS: According to medical records, the patient has tramadol 50 mg three times daily, Neurontin 300 mg three times daily, Keppra 500 mg p.o. twice daily. In the past, she was on Seroquel as well.   SOCIAL HISTORY: She lives with her daughter as well as grandchildren. No alcohol, drug abuse or tobacco abuse. She works as a Therapist, art.   FAMILY HISTORY: According to medical chart, the patient's mother had diabetes as well as heart disease and father had heart disease in his 75s.   ALLERGIES: CONTRAST DYE, IMITREX AS WELL AS CODEINE.   REVIEW OF SYSTEMS: Positive for fever and chills yesterday. Similar pains in the chest after she was extubated, nausea and vomiting prior to the seizure episode, somewhat presyncopal, somewhat dizzy just prior to seizure episodes; otherwise no fevers or chills, fatigue, weakness, weight  loss or gain.  EYES: Denies any blurry vision, double vision, glaucoma or cataracts.  ENT: Denies  any tinnitus, allergies, epistaxis, sinus pain, dentures, difficulty swallowing.  RESPIRATORY: Denies cough, wheezes, asthma,  COPD. CARDIOVASCULAR: Denies orthopnea, edema, arrhythmias, palpitations.  GASTROINTESTINAL: Denies any diarrhea, rectal bleeding, change in bowel habits.  GENITOURINARY: Denies dysuria, hematuria, frequency or incontinence.  ENDOCRINOLOGY: Denies polydipsia, nocturia, thyroid problems, heat or cold intolerance or thirst. HEMATOLOGIC: Denies anemia, easy bruising, bleeding, (Dictation Anomaly)swollen nodes.  SKIN: Denies any rashes, lesions, erythema, nodularity or induration.  MUSCULOSKELETAL: Denies arthritis, cramps, swelling.  NEUROLOGIC: Denies numbness, (Dictation Anomaly, weakness or tremor. PSYCHIATRIC: Denies anxiety, insomnia.   PHYSICAL EXAMINATION:  VITAL SIGNS: On arrival to the hospital, temperature 99.2. Pulse 103, respirations were 20, blood pressure 81/67 initially. Oxygen saturation was 98% on oxygen therapy. During my evaluation, her heart rate is in the 100s. Blood pressure 95 over 60s, and O2 saturations were ranging initially around 80; however, with adjustment of her pulse oximetry itself, it was 99% on room air.  GENERAL: A well-developed, well-nourished Caucasian female in no significant distress, sitting on the stretcher.  HEENT: Pupils equal, reactive to light. Extraocular muscles intact. No icterus or conjunctivitis.  Has normal hearing. No pharyngeal erythema. Pharyngeal mucosa has some erosions, bloody (Dictation Anomaly)saliva likely due to injury with ET tube placement. No other abnormalities were noted.  NECK: No masses. Supple, nontender. No adenopathy. No JVD or carotid bruits bilaterally. Full range of motion.  LUNGS: Clear to auscultation in all fields. No rales, rhonchi, diminished air entry or wheezing. No labored inspirations, increased effort, dullness to percussion or overt respiratory distress.  CARDIOVASCULAR: S1, S2 appreciated. No murmurs, rubs, or gallops noted. PMI  lateralized. Chest is nontender to palpation with 1+ pedal pulses. No lower extremity  edema, calf tenderness, or cyanosis was noted.  ABDOMEN: Soft, nontender. Bowel sounds are present. No hepatosplenomegaly or masses were noted.  RECTAL: Deferred.  MUSCLE STRENGTH: Able to move all extremities. No cyanosis, degenerative joint disease or kyphosis. Gait is not tested.  SKIN: Did not reveal any rashes, lesions, erythema, nodularity or induration. It was warm and dry to palpation.  LYMPHATIC: No adenopathy in the cervical region.  NEUROLOGICAL: Cranial  nerves grossly intact. Sensory is intact. No dysarthria or aphasia. Alert and oriented to time, person, place, cooperative. Memory is good.  PSYCHIATRIC: No significant confusion, agitation or depression noted.   DIAGNOSTIC STUDIES: BMP showed glucose of 122. Alcohol level less than 0.003%. Liver enzymes normal. Urine drug screen was positive for benzodiazepines as well as tricyclic  antidepressants. CBC not done. Urinalysis: Yellow hazy urine, negative for glucose, bilirubin, 1+ ketones, specific gravity 1.028, and pH was 5.0, negative for blood with 30 mg/dL protein, negative for nitrites or leukocyte esterase, 1 red blood cell, 1 white blood cell, no bacteria and less than 1 epithelial cell as well as mucus was present. CT scan of the head showed no acute abnormalities as compared to prior CT done in August 2013. Chest x-ray was unremarkable.   ASSESSMENT AND PLAN:  1.  Seizure episode, status epilepticus. The patient's (Dictation Anomaly)anticonvulsant levels will be  obtained. We will continue Dilantin p.o. We will continue also Keppra. We will get neurology involved and Keppra level is taken. I am concerned that the patient may not be taking her medications appropriately.  2.  Hypotension. We will continue IV fluids.  3.  Chest pain. Will repeat EKG. We will start the patient on aspirin therapy; however, not  able to give her nitroglycerin or beta blocker due to hypotension. Also get D-dimer done and will order Myoview in the morning.   4.  Hyperglycemia. Check hemoglobin A1c.   TIME SPENT: 50 minutes.  ____________________________ Katharina Caper, MD rv:np D: 08/25/2013 12:57:59 ET T: 08/25/2013 18:32:20 ET JOB#: [id number]  cc: Katharina Caper, MD, <Dictator> Shaka Cardin MD ELECTRONICALLY SIGNED 09/18/2013 10:51

## 2015-04-06 NOTE — ED Provider Notes (Signed)
CSN: [id number]     Arrival date & time 04/05/15  1549 History   First MD Initiated Contact with Patient 04/05/15 1556     Chief Complaint  Patient presents with  . Code Stroke    An emergency department physician performed an initial assessment on this suspected stroke patient at 1553 ([place]). (Consider location/radiation/quality/duration/timing/severity/associated sxs/prior Treatment) HPI   Following history is limited due to the fact that the patient is a very poor historian, intermittently hysterical due to the passing of her husband 2 days ago:  This is a 54 year old female, with a medical history of psychosomatic seizures, epilepsy, Lovena Le, hypertension, hyperlipidemia, presenting today as a code stroke from primary care physician's office due to slurred speech and weakness. She cannot state when exactly this started. She thinks that her speech is slurred due to "[place]" after taking by mouth narcotics, benzodiazepines today "for my nerves." She thinks that it might of started at over 900 hours today. She presented to her PCPs office at 1400 due to emotional distress. PCP noticed possible slurred speech, right-sided weakness, called code stroke, transported the patient here for further care. She denies weakness upon arrival, but repeatedly asks for benzodiazepines, "anything to not be out, "due to being emotionally upset from her husband's death. She denies any focal pain. She denies any shortness of breath, abdominal pain. She states that she is intermittently nauseated when she becomes particularly upset. Negative for dysuria, hematuria. Negative for head injury. The patient explicitly denies suicidal ideations, homicidal ideations, auditory or visual hallucinations.  Past Medical History  Diagnosis Date  . GERD (gastroesophageal reflux disease)   . Hyperlipidemia   . History of blood transfusion   . Chronic headaches   . Phlebitis   . History of colonic polyps   . 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 Tonic clonic seizures     Pseudo-Seizure  . Anxiety     "situational stress"  . Kidney stones 9147,8295   Past Surgical History  Procedure Laterality Date  . Cholecystectomy    . Tonsillectomy  1970  . Appendectomy  1975  . Lumbar fusion  1999    L5-S1  . Abdominal hysterectomy  1988  . Portacath placement Left 2008  . Stone extraction with basket    . Back surgery      L5-S1 ALIF  . Adenoidectomy  at 54 years old  . Oophorectomy  1987  . Radiology with anesthesia N/A 10/24/2014    Procedure: RADIOLOGY WITH ANESTHESIA;  Surgeon: Medication Radiologist, MD;  Location: [facility] OR;  Service: Radiology;  Laterality: N/A;   Family History  Problem Relation Age of Onset  . Diabetes Mother   . Hyperlipidemia Mother   . Stroke Mother   . Hypertension Mother   . Colon cancer Mother   . Breast cancer Mother   . Early death Father   . Heart attack Father   . Heart disease Father   . Hyperlipidemia Father   . COPD Sister    History  Substance Use Topics  . Smoking status: Never Smoker   . Smokeless tobacco: Never Used  . Alcohol Use: No   OB History    No data available     Review of Systems  Constitutional: Negative for fever and chills.  HENT: Negative for facial swelling.   Eyes: Negative for photophobia and pain.  Respiratory: Negative for cough and shortness of breath.   Cardiovascular: Negative for chest pain and leg swelling.  Gastrointestinal:  Negative for nausea, vomiting and abdominal pain.  Genitourinary: Negative for dysuria.  Musculoskeletal: Negative for arthralgias.  Skin: Negative for rash and wound.  Neurological: Positive for speech difficulty and weakness.  Hematological: Negative for adenopathy.  Psychiatric/Behavioral: Positive for dysphoric mood. The patient is nervous/anxious.       Allergies  Hepatitis b vaccine; Imitrex; Iohexol; Codeine; and Keppra xr  Home Medications   Prior to Admission medications    Medication Sig Start Date End Date Taking? Authorizing Provider  ALPRAZolam Prudy Feeler) 0.5 MG tablet Take 1 tablet (0.5 mg total) by mouth 3 (three) times daily as needed for anxiety. 01/31/15  Yes Lorre Munroe, NP  amitriptyline (ELAVIL) 25 MG tablet Take 1 tablet (25 mg total) by mouth at bedtime. 01/31/15  Yes Lorre Munroe, NP  aspirin 81 MG tablet Take 81 mg by mouth daily.   Yes Historical Provider, MD  atorvastatin (LIPITOR) 10 MG tablet Take 1 tablet (10 mg total) by mouth daily. 01/03/15  Yes Lorre Munroe, NP  cyclophosphamide (CYTOXAN) 50 MG tablet Take 150 mg by mouth daily. Give on an empty stomach 1 hour before or 2 hours after meals.   Yes Historical Provider, MD  divalproex (DEPAKOTE) 250 MG DR tablet Take 2 tablets (500 mg total) by mouth 3 (three) times daily. Patient taking differently: Take 750 mg by mouth 3 (three) times daily.  09/02/14  Yes Jeanella Craze, NP  gabapentin (NEURONTIN) 300 MG capsule Take 2 capsules (600 mg total) by mouth 2 (two) times daily. 01/18/15  Yes Jeanella Craze, NP  HYDROcodone-acetaminophen (NORCO/VICODIN) 5-325 MG per tablet Take 1 tablet by mouth 3 (three) times daily as needed for moderate pain. 01/22/15  Yes Lorre Munroe, NP  hydroxychloroquine (PLAQUENIL) 200 MG tablet Take 200 mg by mouth 2 (two) times daily.   Yes Historical Provider, MD  levETIRAcetam (KEPPRA) 500 MG tablet Take 2 tablets (1,000 mg total) by mouth 2 (two) times daily. 01/18/15  Yes Jeanella Craze, NP  predniSONE (DELTASONE) 10 MG tablet Take 10 mg by mouth daily with breakfast.   Yes Historical Provider, MD  promethazine (PHENERGAN) 25 MG tablet Take 25 mg by mouth every 6 (six) hours as needed for nausea or vomiting.  01/03/15  Yes Historical Provider, MD  sulfamethoxazole-trimethoprim (BACTRIM,SEPTRA) 400-80 MG per tablet Take 1 tablet by mouth 3 (three) times a week. 01/18/15  Yes Jeanella Craze, NP  topiramate (TOPAMAX) 100 MG tablet Take 1 tablet (100 mg total) by mouth daily.  01/03/15  Yes Van Clines, MD  zolpidem (AMBIEN) 10 MG tablet Take 10 mg by mouth at bedtime as needed for sleep.  01/13/15  Yes Historical Provider, MD   BP 99/70 mmHg  Pulse 75  Temp(Src) 98.3 F (36.8 C) (Oral)  Resp 14  SpO2 99% Physical Exam  Constitutional: She is oriented to person, place, and time. She appears well-developed and well-nourished. No distress.  HENT:  Head: Normocephalic and atraumatic.  Mouth/Throat: No oropharyngeal exudate.  Eyes: Conjunctivae are normal. Pupils are equal, round, and reactive to light. No scleral icterus.  Neck: Normal range of motion. No tracheal deviation present. No thyromegaly present.  Cardiovascular: Normal rate, regular rhythm and normal heart sounds.  Exam reveals no gallop and no friction rub.   No murmur heard. Pulmonary/Chest: Effort normal and breath sounds normal. No stridor. No respiratory distress. She has no wheezes. She has no rales. She exhibits no tenderness.  Abdominal: Soft. She exhibits no distension  and no mass. There is no tenderness. There is no rebound and no guarding.  Musculoskeletal: Normal range of motion. She exhibits no edema.  Neurological: She is alert and oriented to person, place, and time. No cranial nerve deficit. Coordination and gait normal. GCS eye subscore is 4. GCS verbal subscore is 5. GCS motor subscore is 6.  Reflex Scores:      Patellar reflexes are 2+ on the right side and 2+ on the left side. The patient intermittently expresses weakness in her arm, but consistently opposes my attempts to passively range her right arm  Slurred speech resolves with distraction  Skin: Skin is warm and dry. She is not diaphoretic.    ED Course  Procedures (including critical care time) Labs Review Labs Reviewed  COMPREHENSIVE METABOLIC PANEL - Abnormal; Notable for the following:    Glucose, Bld 108 (*)    Total Bilirubin 0.2 (*)    GFR calc non Af Amer 74 (*)    GFR calc Af Amer 86 (*)    All other  components within normal limits  URINE RAPID DRUG SCREEN (HOSP PERFORMED) - Abnormal; Notable for the following:    Benzodiazepines POSITIVE (*)    All other components within normal limits  URINALYSIS, ROUTINE W REFLEX MICROSCOPIC - Abnormal; Notable for the following:    Leukocytes, UA SMALL (*)    All other components within normal limits  I-STAT CHEM 8, ED - Abnormal; Notable for the following:    Glucose, Bld 108 (*)    All other components within normal limits  CBC  DIFFERENTIAL  URINE MICROSCOPIC-ADD ON  I-STAT TROPOININ, ED  I-STAT TROPOININ, ED  CBG MONITORING, ED    Imaging Review Ct Head Wo Contrast  04/05/2015   CLINICAL DATA:  Code stroke.  Slurred speech.  EXAM: CT HEAD WITHOUT CONTRAST  TECHNIQUE: Contiguous axial images were obtained from the base of the skull through the vertex without intravenous contrast.  COMPARISON:  01/16/2015  FINDINGS: Ventricle size is normal. Negative for acute infarct. Negative for hemorrhage or mass. No edema or shift of the midline structures.  Calvarium intact.  IMPRESSION: Negative  Critical Value/emergent results were called by telephone at the time of interpretation on 04/05/2015 at 4:06 pm to Dr. Elspeth ChoPETER SUMNER , who verbally acknowledged these results.   Electronically Signed   By: Marlan Palauharles  Clark M.D.   On: 04/05/2015 16:06   Dg Chest Portable 1 View  04/05/2015   CLINICAL DATA:  Reflux disease  EXAM: PORTABLE CHEST - 1 VIEW  COMPARISON:  01/17/2015  FINDINGS: Cardiac shadow is stable. A left chest wall port is again noted. The overall inspiratory effort is poor although no focal confluent infiltrate is seen. No bony abnormality is noted.  IMPRESSION: Poor inspiratory effort without definitive abnormality.   Electronically Signed   By: Alcide CleverMark  Lukens M.D.   On: 04/05/2015 16:53     EKG Interpretation   Date/Time:  Friday April 05 2015 16:09:57 EDT Ventricular Rate:  95 PR Interval:  142 QRS Duration: 99 QT Interval:  389 QTC  Calculation: 489 R Axis:   -25 Text Interpretation:  Sinus rhythm Abnormal R-wave progression, early  transition Left ventricular hypertrophy Borderline prolonged QT interval  Sinus rhythm Left ventricular hypertrophy Non-specific intra-ventricular  conduction delay Artifact Abnormal ekg Confirmed by Gerhard MunchLOCKWOOD, ROBERT  MD  [phone] on 04/05/2015 4:29:37 PM      MDM   Final diagnoses:  Altered mental status  Anxiety    This is a  54 year old female, with a medical history of psychosomatic seizures, epilepsy, Lovena Le, hypertension, hyperlipidemia, presenting today as a code stroke from primary care physician's office due to slurred speech and weakness. She cannot state when exactly this started. She thinks that her speech is slurred due to "[place]" after taking by mouth narcotics, benzodiazepines today "for my nerves." She thinks that it might of started at over 900 hours today. She presented to her PCPs office at 1400 due to emotional distress. PCP noticed possible slurred speech, right-sided weakness, called code stroke, transported the patient here for further care. She denies weakness upon arrival, but repeatedly asks for benzodiazepines, "anything to not be out, "due to being emotionally upset from her husband's death. She denies any focal pain. She denies any shortness of breath, abdominal pain. She states that she is intermittently nauseated when she becomes particularly upset. Negative for dysuria, hematuria. Negative for head injury. The patient explicitly denies suicidal ideations, homicidal ideations, auditory or visual hallucinations.  Neurology is at bedside. On exam, vitals are within normal limits. Patient is intermittently tearful. Neurologic exam is variable, based on level distraction. Patient resists my attempts to range her right upper extremity passively, but refuses to use it intermittently. The patient's slurred speech resolves with distraction. CT scan of the head is without  acute abnormality. EKG, labs without acute abnormality's. I do not believe that this presentation is consistent with CVA, ICH, dissection, or any other concerning organic process. Neurology initially recommended MRI of the brain, but at this time the patient's neurologic exam is completely within normal limits and it is felt as though her presenting symptoms are due to emotional distress. Patient refuses MRI of the brain. MRI is no longer recommended by neuro. I do not deem MRI appropriate.  I have discussed the goals of the patient's presentation at great length with the patient. She is consistently requested benzodiazepines, sleep aids. I have discussed at great length the inappropriate nature of prescribing these types of medications from the emergency department. She expresses her desire to go home with her family. I deemed this appropriate, as I do not believe that admission due to emotional distress is appropriate.  The patient's daughter is at bedside and states that she would like to take her mother home so that they can mourn together. I deemed this appropriate. I have expressed my condolences for her loss.  Pt stable for discharge, FU.  All questions answered.  Return precautions given.  I have discussed case and care has been guided by my attending physician, Dr. Jeraldine Loots.  Loma Boston, MD 04/06/15 [id number]  Gerhard Munch, MD 04/07/15 [id number]

## 2015-04-17 ENCOUNTER — Telehealth: Payer: Self-pay | Admitting: Neurology

## 2015-04-17 NOTE — Telephone Encounter (Signed)
ERROR

## 2015-04-19 ENCOUNTER — Ambulatory Visit: Payer: Self-pay | Admitting: Neurology

## 2015-04-20 ENCOUNTER — Other Ambulatory Visit: Payer: Self-pay | Admitting: Neurology

## 2015-05-03 ENCOUNTER — Ambulatory Visit (INDEPENDENT_AMBULATORY_CARE_PROVIDER_SITE_OTHER): Payer: Federal, State, Local not specified - PPO | Admitting: Family Medicine

## 2015-05-03 ENCOUNTER — Telehealth: Payer: Self-pay

## 2015-05-03 ENCOUNTER — Encounter: Payer: Self-pay | Admitting: Family Medicine

## 2015-05-03 VITALS — BP 130/87 | HR 99 | Temp 98.7°F | Resp 16 | Ht 68.0 in | Wt 190.8 lb

## 2015-05-03 DIAGNOSIS — F419 Anxiety disorder, unspecified: Secondary | ICD-10-CM | POA: Diagnosis not present

## 2015-05-03 DIAGNOSIS — G4700 Insomnia, unspecified: Secondary | ICD-10-CM

## 2015-05-03 DIAGNOSIS — M352 Behcet's disease: Secondary | ICD-10-CM | POA: Diagnosis not present

## 2015-05-03 DIAGNOSIS — Z634 Disappearance and death of family member: Secondary | ICD-10-CM | POA: Diagnosis not present

## 2015-05-03 DIAGNOSIS — H469 Unspecified optic neuritis: Secondary | ICD-10-CM | POA: Diagnosis not present

## 2015-05-03 DIAGNOSIS — R569 Unspecified convulsions: Secondary | ICD-10-CM

## 2015-05-03 DIAGNOSIS — G43809 Other migraine, not intractable, without status migrainosus: Secondary | ICD-10-CM | POA: Diagnosis not present

## 2015-05-03 DIAGNOSIS — Z1239 Encounter for other screening for malignant neoplasm of breast: Secondary | ICD-10-CM | POA: Diagnosis not present

## 2015-05-03 DIAGNOSIS — Z1211 Encounter for screening for malignant neoplasm of colon: Secondary | ICD-10-CM

## 2015-05-03 DIAGNOSIS — K219 Gastro-esophageal reflux disease without esophagitis: Secondary | ICD-10-CM | POA: Diagnosis not present

## 2015-05-03 MED ORDER — PREDNISONE 10 MG PO TABS: 10.0000 mg | ORAL_TABLET | Freq: Every day | ORAL | Status: DC

## 2015-05-03 MED ORDER — HYDROXYCHLOROQUINE SULFATE 200 MG PO TABS: 200.0000 mg | ORAL_TABLET | Freq: Two times a day (BID) | ORAL | Status: DC

## 2015-05-03 MED ORDER — ZOLPIDEM TARTRATE 10 MG PO TABS: 10.0000 mg | ORAL_TABLET | Freq: Every evening | ORAL | Status: DC | PRN

## 2015-05-03 MED ORDER — HYDROXYCHLOROQUINE SULFATE 200 MG PO TABS: 200.0000 mg | ORAL_TABLET | Freq: Two times a day (BID) | ORAL | Status: AC

## 2015-05-03 MED ORDER — HYDROCODONE-ACETAMINOPHEN 5-325 MG PO TABS: 1.0000 | ORAL_TABLET | Freq: Three times a day (TID) | ORAL | Status: DC | PRN

## 2015-05-03 MED ORDER — ZOLPIDEM TARTRATE 10 MG PO TABS
10.0000 mg | ORAL_TABLET | Freq: Every evening | ORAL | Status: DC | PRN
Start: 2015-05-03 — End: 2015-07-02

## 2015-05-03 MED ORDER — ALPRAZOLAM 0.5 MG PO TABS: 0.5000 mg | ORAL_TABLET | Freq: Three times a day (TID) | ORAL | Status: DC | PRN

## 2015-05-03 MED ORDER — ATORVASTATIN CALCIUM 10 MG PO TABS: 10.0000 mg | ORAL_TABLET | Freq: Every day | ORAL | Status: DC

## 2015-05-03 NOTE — Telephone Encounter (Signed)
[facility]  She will come back tonight and get it.  Saw Dr. At 104 today.  [phone]

## 2015-05-03 NOTE — Progress Notes (Addendum)
Chief Complaint:  Chief Complaint  Patient presents with  . Establish Care    HPI: Brenda Mcguire is a 54 y.o. female who is here for establishment of care. She has been on medications but due to the fact that she has been dealing with her husband status she has not been taking her medicines for the last 2-3 weeks. He died about 3 weeks ago. She states that she is doing better at work than at home since it keeps her mind off of her husband's death. He thinks that he might have overdosed on cough syrup medication that was prescribed him for a cough. He was visiting his mom and into the ED and they did a workup and he did not have pneumonia but his cough would not stop. The police called her and told her that he was deceased. 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 She was on Cytoxan and was on prednisone and plaquinal . She is currently not taking any of them.  She would like to stay off of the Cytoxan until she is able to see her new rheumatologist. She has a history of anxiety/depression. She has been off of her anxiety medications. She also has insomnia and at night this has gotten worse due to the events that have happened. She has good support with her children. She denies any suicidal thoughts, homicidal thoughts or hallucinations. She is also due for a mammogram and colonoscopy. She has been taking her seizure medications, these are the only medication she's been on.   Past Medical History  Diagnosis Date  . GERD (gastroesophageal reflux disease)   . Hyperlipidemia   . History of blood transfusion   . Chronic headaches   . Phlebitis   . History of colonic polyps   . 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 Tonic clonic seizures     Pseudo-Seizure  . Anxiety     "situational stress"  . Kidney stones [id number]   Past Surgical History  Procedure Laterality Date  . Cholecystectomy    .  Tonsillectomy  1970  . Appendectomy  1975  . Lumbar fusion  1999    L5-S1  . Abdominal hysterectomy  1988  . Portacath placement Left 2008  . Stone extraction with basket    . Back surgery      L5-S1 ALIF  . Adenoidectomy  at 54 years old  . Oophorectomy  1987  . Radiology with anesthesia N/A 10/24/2014    Procedure: RADIOLOGY WITH ANESTHESIA;  Surgeon: Medication Radiologist, MD;  Location: [facility] OR;  Service: Radiology;  Laterality: N/A;   History   Social History  . Marital Status: Married    Spouse Name: N/A  . Number of Children: 2  . Years of Education: 14   Occupational History  . Nurse     [employer]  .     Social History Main Topics  . Smoking status: Never Smoker   . Smokeless tobacco: Never Used  . Alcohol Use: No  . Drug Use: No  . Sexual Activity: Yes    Birth Control/ Protection: Surgical   Other Topics Concern  . None   Social History Narrative   Lives with husband.  They have 2 grown children.   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 Diabetes Mother   . Hyperlipidemia Mother   . Stroke Mother   . Hypertension Mother   . Colon cancer Mother   . Breast cancer Mother   . Early death Father   . Heart attack Father   . Heart disease Father   . Hyperlipidemia Father   . COPD Sister    Allergies  Allergen Reactions  . Hepatitis B Vaccine Hives and Shortness Of Breath    Causes wheezing   . Imitrex [Sumatriptan] Other (See Comments)    Makes her have SVT and chest pain  . Iohexol Anaphylaxis, Hives and Shortness Of Breath  . Codeine Itching and Rash  . Keppra Xr [Levetiracetam] Nausea Only   Prior to Admission medications   Medication Sig Start Date End Date Taking? Authorizing Provider  ALPRAZolam Prudy Feeler(XANAX) 0.5 MG tablet Take 1 tablet (0.5 mg total) by mouth 3  (three) times daily as needed for anxiety. 01/31/15  Yes Lorre Munroeegina W Baity, NP  amitriptyline (ELAVIL) 25 MG tablet Take 1 tablet (25 mg total) by mouth at bedtime. 01/31/15  Yes Lorre Munroeegina W Baity, NP  aspirin 81 MG tablet Take 81 mg by mouth daily.   Yes Historical Provider, MD  atorvastatin (LIPITOR) 10 MG tablet Take 1 tablet (10 mg total) by mouth daily. 01/03/15  Yes Lorre Munroeegina W Baity, NP  cyclophosphamide (CYTOXAN) 50 MG tablet Take 150 mg by mouth daily. Give on an empty stomach 1 hour before or 2 hours after meals.   Yes Historical Provider, MD  divalproex (DEPAKOTE) 250 MG DR tablet Take 2 tablets (500 mg total) by mouth 3 (three) times daily. Patient taking differently: Take 750 mg by mouth 3 (three) times daily.  09/02/14  Yes Jeanella CrazeBrandi L Ollis, NP  gabapentin (NEURONTIN) 300 MG capsule TAKE 3 CAPSULES (900 MG TOTAL) BY MOUTH 3 (THREE) TIMES DAILY. 04/22/15  Yes Van ClinesKaren M Aquino, MD  HYDROcodone-acetaminophen (NORCO/VICODIN) 5-325 MG per tablet Take 1 tablet by mouth 3 (three) times daily as needed for moderate pain. 01/22/15  Yes Lorre Munroeegina W Baity, NP  hydroxychloroquine (PLAQUENIL) 200 MG tablet Take 200 mg by mouth 2 (two) times daily.   Yes Historical Provider, MD  levETIRAcetam (KEPPRA) 500 MG tablet Take 2 tablets (1,000 mg total) by mouth 2 (two) times daily. 01/18/15  Yes Jeanella CrazeBrandi L Ollis, NP  predniSONE (DELTASONE) 10 MG tablet Take 10 mg by mouth daily with breakfast.   Yes Historical Provider, MD  promethazine (PHENERGAN) 25 MG tablet Take 25 mg by mouth every 6 (six) hours as needed for nausea or vomiting.  01/03/15  Yes Historical Provider, MD  sulfamethoxazole-trimethoprim (BACTRIM,SEPTRA) 400-80 MG per tablet Take 1 tablet by mouth 3 (three) times a week. 01/18/15  Yes Jeanella CrazeBrandi L Ollis, NP  topiramate (TOPAMAX) 100 MG tablet Take 1 tablet (100 mg total) by mouth daily. 01/03/15  Yes Van ClinesKaren M Aquino, MD  zolpidem (AMBIEN) 10 MG tablet Take 10 mg by mouth at bedtime as needed for sleep.  01/13/15  Yes Historical  Provider, MD     ROS: The patient denies fevers, chills, night sweats, unintentional weight loss, chest pain, palpitations, wheezing, dyspnea on exertion, nausea, vomiting, abdominal pain, dysuria, hematuria, melena, numbness, weakness, or tingling.   All other systems have been reviewed and were otherwise negative with the exception of those mentioned in the HPI and as above.    PHYSICAL EXAM: Filed Vitals:  05/03/15 1022  BP: 130/87  Pulse: 99  Temp: 98.7 F (37.1 C)  Resp: 16   Filed Vitals:   05/03/15 1022  Height:  (1.727 m)  Weight: 190 lb 12.8 oz (86.546 kg)   Body mass index is 29.02 kg/(m^2).  General: Alert, tearful, anxious HEENT:  Normocephalic, atraumatic, oropharynx patent. EOMI, PERRLA Cardiovascular:  Regular rate and rhythm, no rubs murmurs or gallops.  No Carotid bruits, radial pulse intact. No pedal edema.  Respiratory: Clear to auscultation bilaterally.  No wheezes, rales, or rhonchi.  No cyanosis, no use of accessory musculature GI: No organomegaly, abdomen is soft and non-tender, positive bowel sounds.  No masses. Skin: No rashes. Neurologic: Facial musculature symmetric. Psychiatric: Patient is appropriate throughout our interaction. Lymphatic: No cervical lymphadenopathy Musculoskeletal: Gait intact.   LABS: Results for orders placed or performed during the hospital encounter of 04/05/15  CBC  Result Value Ref Range   WBC 5.6 4.0 - 10.5 K/uL   RBC 3.93 3.87 - 5.11 MIL/uL   Hemoglobin 12.1 12.0 - 15.0 g/dL   HCT 54.0 98.1 - 19.1 %   MCV 92.9 78.0 - 100.0 fL   MCH 30.8 26.0 - 34.0 pg   MCHC 33.2 30.0 - 36.0 g/dL   RDW 47.8 29.5 - 62.1 %   Platelets 200 150 - 400 K/uL  Differential  Result Value Ref Range   Neutrophils Relative % 43 43 - 77 %   Neutro Abs 2.4 1.7 - 7.7 K/uL   Lymphocytes Relative 45 12 - 46 %   Lymphs Abs 2.5 0.7 - 4.0 K/uL   Monocytes Relative 7 3 - 12 %   Monocytes Absolute 0.4 0.1 - 1.0 K/uL   Eosinophils Relative  5 0 - 5 %   Eosinophils Absolute 0.3 0.0 - 0.7 K/uL   Basophils Relative 0 0 - 1 %   Basophils Absolute 0.0 0.0 - 0.1 K/uL  Comprehensive metabolic panel  Result Value Ref Range   Sodium 141 135 - 145 mmol/L   Potassium 4.3 3.5 - 5.1 mmol/L   Chloride 107 96 - 112 mmol/L   CO2 24 19 - 32 mmol/L   Glucose, Bld 108 (H) 70 - 99 mg/dL   BUN 15 6 - 23 mg/dL   Creatinine, Ser 3.08 0.50 - 1.10 mg/dL   Calcium 9.6 8.4 - 65.7 mg/dL   Total Protein 6.9 6.0 - 8.3 g/dL   Albumin 3.9 3.5 - 5.2 g/dL   AST 23 0 - 37 U/L   ALT 17 0 - 35 U/L   Alkaline Phosphatase 109 39 - 117 U/L   Total Bilirubin 0.2 (L) 0.3 - 1.2 mg/dL   GFR calc non Af Amer 74 (L) >90 mL/min   GFR calc Af Amer 86 (L) >90 mL/min   Anion gap 10 5 - 15  Urine Drug Screen  Result Value Ref Range   Opiates NONE DETECTED NONE DETECTED   Cocaine NONE DETECTED NONE DETECTED   Benzodiazepines POSITIVE (A) NONE DETECTED   Amphetamines NONE DETECTED NONE DETECTED   Tetrahydrocannabinol NONE DETECTED NONE DETECTED   Barbiturates NONE DETECTED NONE DETECTED  Urinalysis, Routine w reflex microscopic  Result Value Ref Range   Color, Urine YELLOW YELLOW   APPearance CLEAR CLEAR   Specific Gravity, Urine 1.006 1.005 - 1.030   pH 7.5 5.0 - 8.0   Glucose, UA NEGATIVE NEGATIVE mg/dL   Hgb urine dipstick NEGATIVE NEGATIVE   Bilirubin Urine NEGATIVE NEGATIVE   Ketones, ur NEGATIVE  NEGATIVE mg/dL   Protein, ur NEGATIVE NEGATIVE mg/dL   Urobilinogen, UA 1.0 0.0 - 1.0 mg/dL   Nitrite NEGATIVE NEGATIVE   Leukocytes, UA SMALL (A) NEGATIVE  Urine microscopic-add on  Result Value Ref Range   Squamous Epithelial / LPF RARE RARE   WBC, UA 3-6 <3 WBC/hpf   RBC / HPF 0-2 <3 RBC/hpf   Bacteria, UA RARE RARE  I-Stat Chem 8, ED  Result Value Ref Range   Sodium 142 135 - 145 mmol/L   Potassium 4.2 3.5 - 5.1 mmol/L   Chloride 107 96 - 112 mmol/L   BUN 15 6 - 23 mg/dL   Creatinine, Ser 9.60 0.50 - 1.10 mg/dL   Glucose, Bld 454 (H) 70 - 99  mg/dL   Calcium, Ion 0.98 1.12 - 1.23 mmol/L   TCO2 21 0 - 100 mmol/L   Hemoglobin 12.9 12.0 - 15.0 g/dL   HCT 11.9 14.7 - 82.9 %  I-Stat Troponin, ED (not at [facility])  Result Value Ref Range   Troponin i, poc 0.00 0.00 - 0.08 ng/mL   Comment 3          CBG monitoring, ED  Result Value Ref Range   Glucose-Capillary 99 70 - 99 mg/dL     EKG/XRAY:   Primary read interpreted by Dr. Conley Rolls at [facility].   ASSESSMENT/PLAN: Encounter Diagnoses  Name Primary?  . Behcet's disease   . Bereavement Yes  . Anxiety   . Insomnia   . Other type of migraine   . Optic neuritis   . Seizures   . Encounter for screening colonoscopy   . Gastroesophageal reflux disease without esophagitis   . Screening for breast cancer    54 y/o female  with a past medical history of seizures, Behcet's syndrome, anxiety/depression, insomnia, optic neuritis Here to establish care and also refill of her medications. I am familiar with her husband who recently passed away. She is in the bereavement stage. Referral to rheumatology at [facility] Referral to bereavement counseling once I get a phone number that I think is appropriate for her. Refilled her medications Follow-up in one month for labs, she will also get colonoscopy and breast cancer screening. [place] controlled substance profile pulled and there seems to be no illegal activities.  Gross sideeffects, risk and benefits, and alternatives of medications d/w patient. Patient is aware that all medications have potential sideeffects and we are unable to predict every sideeffect or drug-drug interaction that may occur.  Ludean Duhart PHUONG, DO 05/03/2015 11:24 AM

## 2015-05-04 ENCOUNTER — Observation Stay (HOSPITAL_COMMUNITY)
Admission: EM | Admit: 2015-05-04 | Discharge: 2015-05-06 | Disposition: A | Discharge status: Home / Self Care | Payer: Federal, State, Local not specified - PPO | Attending: Internal Medicine | Admitting: Internal Medicine

## 2015-05-04 ENCOUNTER — Encounter (HOSPITAL_COMMUNITY): Payer: Self-pay | Admitting: *Deleted

## 2015-05-04 DIAGNOSIS — Z833 Family history of diabetes mellitus: Secondary | ICD-10-CM | POA: Insufficient documentation

## 2015-05-04 DIAGNOSIS — Z885 Allergy status to narcotic agent status: Secondary | ICD-10-CM | POA: Diagnosis not present

## 2015-05-04 DIAGNOSIS — Z7982 Long term (current) use of aspirin: Secondary | ICD-10-CM | POA: Diagnosis not present

## 2015-05-04 DIAGNOSIS — F419 Anxiety disorder, unspecified: Secondary | ICD-10-CM | POA: Diagnosis not present

## 2015-05-04 DIAGNOSIS — Z800 Family history of malignant neoplasm of digestive organs: Secondary | ICD-10-CM | POA: Insufficient documentation

## 2015-05-04 DIAGNOSIS — T391X2A Poisoning by 4-Aminophenol derivatives, intentional self-harm, initial encounter: Secondary | ICD-10-CM | POA: Diagnosis not present

## 2015-05-04 DIAGNOSIS — Z90722 Acquired absence of ovaries, bilateral: Secondary | ICD-10-CM | POA: Insufficient documentation

## 2015-05-04 DIAGNOSIS — I252 Old myocardial infarction: Secondary | ICD-10-CM | POA: Diagnosis not present

## 2015-05-04 DIAGNOSIS — Z90710 Acquired absence of both cervix and uterus: Secondary | ICD-10-CM | POA: Insufficient documentation

## 2015-05-04 DIAGNOSIS — K219 Gastro-esophageal reflux disease without esophagitis: Secondary | ICD-10-CM | POA: Diagnosis not present

## 2015-05-04 DIAGNOSIS — F322 Major depressive disorder, single episode, severe without psychotic features: Secondary | ICD-10-CM | POA: Insufficient documentation

## 2015-05-04 DIAGNOSIS — Z86010 Personal history of colonic polyps: Secondary | ICD-10-CM | POA: Insufficient documentation

## 2015-05-04 DIAGNOSIS — Z823 Family history of stroke: Secondary | ICD-10-CM | POA: Diagnosis not present

## 2015-05-04 DIAGNOSIS — T426X1A Poisoning by other antiepileptic and sedative-hypnotic drugs, accidental (unintentional), initial encounter: Secondary | ICD-10-CM | POA: Insufficient documentation

## 2015-05-04 DIAGNOSIS — T424X1A Poisoning by benzodiazepines, accidental (unintentional), initial encounter: Secondary | ICD-10-CM | POA: Diagnosis not present

## 2015-05-04 DIAGNOSIS — Z8249 Family history of ischemic heart disease and other diseases of the circulatory system: Secondary | ICD-10-CM | POA: Insufficient documentation

## 2015-05-04 DIAGNOSIS — Z803 Family history of malignant neoplasm of breast: Secondary | ICD-10-CM | POA: Insufficient documentation

## 2015-05-04 DIAGNOSIS — E785 Hyperlipidemia, unspecified: Secondary | ICD-10-CM | POA: Diagnosis not present

## 2015-05-04 DIAGNOSIS — T391X4A Poisoning by 4-Aminophenol derivatives, undetermined, initial encounter: Secondary | ICD-10-CM | POA: Diagnosis present

## 2015-05-04 DIAGNOSIS — T50901A Poisoning by unspecified drugs, medicaments and biological substances, accidental (unintentional), initial encounter: Secondary | ICD-10-CM | POA: Diagnosis present

## 2015-05-04 DIAGNOSIS — K029 Dental caries, unspecified: Secondary | ICD-10-CM | POA: Diagnosis not present

## 2015-05-04 DIAGNOSIS — M352 Behcet's disease: Secondary | ICD-10-CM | POA: Diagnosis not present

## 2015-05-04 DIAGNOSIS — G4700 Insomnia, unspecified: Secondary | ICD-10-CM | POA: Diagnosis present

## 2015-05-04 DIAGNOSIS — F445 Conversion disorder with seizures or convulsions: Secondary | ICD-10-CM | POA: Diagnosis present

## 2015-05-04 DIAGNOSIS — R569 Unspecified convulsions: Secondary | ICD-10-CM | POA: Diagnosis present

## 2015-05-04 LAB — RAPID URINE DRUG SCREEN, HOSP PERFORMED
AMPHETAMINES: NOT DETECTED
BARBITURATES: NOT DETECTED
Benzodiazepines: POSITIVE — AB
Cocaine: NOT DETECTED
OPIATES: POSITIVE — AB
Tetrahydrocannabinol: NOT DETECTED

## 2015-05-04 LAB — COMPREHENSIVE METABOLIC PANEL
ALT: 202 U/L — AB (ref 14–54)
AST: 175 U/L — ABNORMAL HIGH (ref 15–41)
Albumin: 4.6 g/dL (ref 3.5–5.0)
Alkaline Phosphatase: 107 U/L (ref 38–126)
Anion gap: 11 (ref 5–15)
BILIRUBIN TOTAL: 0.6 mg/dL (ref 0.3–1.2)
BUN: 22 mg/dL — ABNORMAL HIGH (ref 6–20)
CHLORIDE: 108 mmol/L (ref 101–111)
CO2: 23 mmol/L (ref 22–32)
Calcium: 9.4 mg/dL (ref 8.9–10.3)
Creatinine, Ser: 0.85 mg/dL (ref 0.44–1.00)
Glucose, Bld: 90 mg/dL (ref 65–99)
POTASSIUM: 4.2 mmol/L (ref 3.5–5.1)
Sodium: 142 mmol/L (ref 135–145)
Total Protein: 8 g/dL (ref 6.5–8.1)

## 2015-05-04 LAB — URINE MICROSCOPIC-ADD ON

## 2015-05-04 LAB — URINALYSIS, ROUTINE W REFLEX MICROSCOPIC
Glucose, UA: NEGATIVE mg/dL
HGB URINE DIPSTICK: NEGATIVE
KETONES UR: NEGATIVE mg/dL
Nitrite: NEGATIVE
PH: 5.5 (ref 5.0–8.0)
PROTEIN: NEGATIVE mg/dL
Specific Gravity, Urine: 1.038 — ABNORMAL HIGH (ref 1.005–1.030)
UROBILINOGEN UA: 1 mg/dL (ref 0.0–1.0)

## 2015-05-04 LAB — CBC
HEMATOCRIT: 40.8 % (ref 36.0–46.0)
Hemoglobin: 13.1 g/dL (ref 12.0–15.0)
MCH: 30.4 pg (ref 26.0–34.0)
MCHC: 32.1 g/dL (ref 30.0–36.0)
MCV: 94.7 fL (ref 78.0–100.0)
PLATELETS: 249 10*3/uL (ref 150–400)
RBC: 4.31 MIL/uL (ref 3.87–5.11)
RDW: 12.8 % (ref 11.5–15.5)
WBC: 6.3 10*3/uL (ref 4.0–10.5)

## 2015-05-04 LAB — SALICYLATE LEVEL

## 2015-05-04 LAB — ETHANOL: Alcohol, Ethyl (B): <5 mg/dL (ref <5)

## 2015-05-04 LAB — ACETAMINOPHEN LEVEL: Acetaminophen (Tylenol), Serum: <10 ug/mL — ABNORMAL LOW (ref 10–30)

## 2015-05-04 LAB — PREGNANCY, URINE: PREG TEST UR: NEGATIVE

## 2015-05-04 MED ORDER — ACETYLCYSTEINE 20 % IN SOLN
140.0000 mg/kg | Freq: Once | RESPIRATORY_TRACT | Status: AC
  Administered 2015-05-05: 12120 mg via ORAL
  Filled 2015-05-04: qty 90

## 2015-05-04 MED ORDER — SODIUM CHLORIDE 0.9 % IV BOLUS (SEPSIS)
500.0000 mL | Freq: Once | INTRAVENOUS | Status: AC
  Administered 2015-05-04: 500 mL via INTRAVENOUS

## 2015-05-04 NOTE — ED Notes (Signed)
Per EMS report: pt got prescriptions for hydrocodone, Ambien, Xanax to help her sleep which were filled yesterday. EMS found pill bottles and based upon pill counts 20 hydrocones, 10 ambiens, and 15 Xanax.  Pt's husband passed away three weeks ago. Pt has denied SI to EMS. Pt a/o x 4 but tearful and states "I just want to go home." Skin warm and dry.ABCD's intact.
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
## 2015-05-04 NOTE — ED Notes (Signed)
Bed: RESB Expected date:  Expected time:  Means of arrival:  Comments: EMS overdose 

## 2015-05-04 NOTE — BH Assessment (Signed)
Assessment completed. Consulted Maryjean Mornharles Kober, PA-C who recommended that pt be evaluated by psychiatry in the morning. Dr. Hyacinth MeekerMiller has been informed of the recommendation.

## 2015-05-04 NOTE — ED Provider Notes (Signed)
The patient is a 54 year old female, she has a history recently losing her husband, she states that he died unexpectedly from an illness while he was out of town in the last 2 months, she states that she has been having significant difficulty sleeping and because of this and after getting her medications filled yesterday went home and took medications to help her sleep including Norco containing Tylenol, Xanax and Ambien. She is unsure who called paramedics but arrives today appearing inebriated, sleepy, tearful, though she denies suicidal ideation she did have an 52 year old brother who died of suicide many years ago. On exam the patient has a soft nontender abdomen and denies nausea, she has clear heart and lung sounds, she has a borderline tachycardia, her LFTs are elevated which I would have to assume is related to a Tylenol overdose, her level is undetectable at this time. The patient will need to be admitted to the hospital and received an acetylcysteine therapy, will discussed with poison control and the hospitalist.  D/[facility] Dr[facility]  The pt has been given NAC and has already exhibited signs of liver failure from a toxic dose of Tylenol overdose - she is critically ill.  She has been reevaluated multiple times and remains emotionally labile.  CRITICAL CARE Performed by: Vida RollerMILLER,Jennaya Pogue D Total critical care time: *35 Critical care time was exclusive of separately billable procedures and treating other patients. Critical care was necessary to treat or prevent imminent or life-threatening deterioration. Critical care was time spent personally by me on the following activities: development of treatment plan with patient and/or surrogate as well as nursing, discussions with consultants, evaluation of patient's response to treatment, examination of patient, obtaining history from patient or surrogate, ordering and performing treatments and interventions, ordering and review of  laboratory studies, ordering and review of radiographic studies, pulse oximetry and re-evaluation of patient's condition.   Medical screening examination/treatment/procedure(s) were conducted as a shared visit with non-physician practitioner(s) and myself.  I personally evaluated the patient during the encounter.  Clinical Impression:   Final diagnoses:  Intentional acetaminophen overdose, initial encounter  Transaminitis       Eber HongBrian Hazleigh Mccleave, MD 05/10/15 [phone]

## 2015-05-04 NOTE — ED Notes (Signed)
Pt continues to be tearful and states, "I wasn't trying to kill myself.  I just wanted to go to sleep.  I haven't slept in 5 days."

## 2015-05-04 NOTE — ED Provider Notes (Signed)
CSN: [id number]     Arrival date & time 05/04/15  2127 History   First MD Initiated Contact with Patient 05/04/15 2130     Chief Complaint  Patient presents with  . Drug Overdose     (Consider location/radiation/quality/duration/timing/severity/associated sxs/prior Treatment) HPI     Brenda Mcguire is a 54 year old female with complicated medical and psychiatric history who presented to [facility] emergency department today via EMS for suspected intentional drug overdose of 15 Xanax (0.5mg ), 20 Norco (5-325), and 10 Ambien ( ).  History is obtained from chart review and patient.   Yesterday (5/20) the patient picked up her prescriptions from PCP and today all bottles were found empty and it is unclear who called EMS.  She lives at home alone with her dog.  Her husband recently died and her brother committed suicide.  She denies SI/HI and AVH, and "just wanted to get some sleep."  Her PCP was trying to establish bereavement counseling for the loss of her husband. She denies any CP, SOB, abdominal pain, and she says her heart rate is elevated because she is upset.      Past Medical History  Diagnosis Date  . GERD (gastroesophageal reflux disease)   . Hyperlipidemia   . History of blood transfusion   . Chronic headaches   . Phlebitis   . History of colonic polyps   . 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 Tonic clonic seizures     Pseudo-Seizure  . Anxiety     "situational stress"  . Kidney stones 4098,1191  . Myocardial infarction    Past Surgical History  Procedure Laterality Date  . Cholecystectomy    . Tonsillectomy  1970  . Appendectomy  1975  . Lumbar fusion  1999    L5-S1  . Abdominal hysterectomy  1988  . Portacath placement Left 2008  . Stone extraction with basket    . Back surgery      L5-S1 ALIF  . Adenoidectomy  at 55 years old  . Oophorectomy  1987  . Radiology with anesthesia N/A 10/24/2014    Procedure: RADIOLOGY WITH ANESTHESIA;  Surgeon: Medication  Radiologist, MD;  Location: [facility] OR;  Service: Radiology;  Laterality: N/A;   Family History  Problem Relation Age of Onset  . Diabetes Mother   . Hyperlipidemia Mother   . Stroke Mother   . Hypertension Mother   . Colon cancer Mother   . Breast cancer Mother   . Early death Father   . Heart attack Father   . Heart disease Father   . Hyperlipidemia Father   . COPD Sister   . Heart disease Maternal Grandmother   . Heart disease Maternal Grandfather   . Heart disease Paternal Grandmother   . Heart disease Paternal Grandfather    History  Substance Use Topics  . Smoking status: Never Smoker   . Smokeless tobacco: Never Used  . Alcohol Use: No   OB History    No data available     Review of Systems  All other systems reviewed and are negative.     Allergies  Hepatitis b vaccine; Imitrex; Iohexol; Codeine; and Levetiracetam  Home Medications   Prior to Admission medications   Medication Sig Start Date End Date Taking? Authorizing Provider  ALPRAZolam Prudy Feeler) 0.5 MG tablet Take 1 tablet (0.5 mg total) by mouth 3 (three) times daily as needed for anxiety. 05/03/15  Yes Thao P Le, DO  atorvastatin (LIPITOR) 10 MG tablet Take  1 tablet (10 mg total) by mouth daily. 05/03/15  Yes Thao P Le, DO  gabapentin (NEURONTIN) 300 MG capsule TAKE 3 CAPSULES (900 MG TOTAL) BY MOUTH 3 (THREE) TIMES DAILY. 04/22/15  Yes Van Clines, MD  HYDROcodone-acetaminophen (NORCO/VICODIN) 5-325 MG per tablet Take 1 tablet by mouth 3 (three) times daily as needed for moderate pain. 05/03/15  Yes Morrell Riddle, PA-C  topiramate (TOPAMAX) 100 MG tablet Take 1 tablet (100 mg total) by mouth daily. 01/03/15  Yes Van Clines, MD  zolpidem (AMBIEN) 10 MG tablet Take 1 tablet (10 mg total) by mouth at bedtime as needed for sleep. 05/03/15  Yes Thao P Le, DO  amitriptyline (ELAVIL) 25 MG tablet Take 1 tablet (25 mg total) by mouth at bedtime. 01/31/15   Lorre Munroe, NP  aspirin 81 MG tablet Take 81 mg by mouth  daily.    Historical Provider, MD  cyclophosphamide (CYTOXAN) 50 MG tablet Take 150 mg by mouth daily. Give on an empty stomach 1 hour before or 2 hours after meals.    Historical Provider, MD  divalproex (DEPAKOTE) 250 MG DR tablet Take 2 tablets (500 mg total) by mouth 3 (three) times daily. Patient taking differently: Take 750 mg by mouth 3 (three) times daily.  09/02/14   Jeanella Craze, NP  hydroxychloroquine (PLAQUENIL) 200 MG tablet Take 1 tablet (200 mg total) by mouth 2 (two) times daily. 05/03/15   Thao P Le, DO  levETIRAcetam (KEPPRA) 500 MG tablet Take 2 tablets (1,000 mg total) by mouth 2 (two) times daily. 01/18/15   Jeanella Craze, NP  predniSONE (DELTASONE) 10 MG tablet Take 1 tablet (10 mg total) by mouth daily with breakfast. 05/03/15   Thao P Le, DO  promethazine (PHENERGAN) 25 MG tablet Take 25 mg by mouth every 6 (six) hours as needed for nausea or vomiting.  01/03/15   Historical Provider, MD  sulfamethoxazole-trimethoprim (BACTRIM,SEPTRA) 400-80 MG per tablet Take 1 tablet by mouth 3 (three) times a week. 01/18/15   Jeanella Craze, NP   BP 150/77 mmHg  Pulse 107  Temp(Src) 98.6 F (37 C) (Oral)  Resp 22  SpO2 98% Physical Exam  Constitutional: She is oriented to person, place, and time. She appears well-developed and well-nourished.  Emotionally distressed and tearful  HENT:  Head: Normocephalic and atraumatic.  Cardiovascular: Regular rhythm and normal heart sounds.  Tachycardia present.  Exam reveals no gallop and no friction rub.   No murmur heard. Pulmonary/Chest: No respiratory distress. She has no wheezes. She exhibits no tenderness.  Abdominal: Soft. She exhibits no distension.  Neurological: She is alert and oriented to person, place, and time.  Skin: Skin is warm and dry. No rash noted. No erythema.  Psychiatric: Her mood appears anxious. Her affect is angry and labile. Her speech is slurred. She exhibits a depressed mood. She expresses no suicidal plans and no  homicidal plans. She exhibits abnormal recent memory.  Vitals reviewed.   ED Course  Procedures (including critical care time) Labs Review Labs Reviewed  CBC  COMPREHENSIVE METABOLIC PANEL  ETHANOL  ACETAMINOPHEN LEVEL  SALICYLATE LEVEL  URINE RAPID DRUG SCREEN (HOSP PERFORMED)  URINALYSIS, ROUTINE W REFLEX MICROSCOPIC  PREGNANCY, URINE    Imaging Review No results found.   EKG Interpretation   Date/Time:  Saturday May 04 2015 22:13:58 EDT Ventricular Rate:  94 PR Interval:  143 QRS Duration: 98 QT Interval:  377 QTC Calculation: 471 R Axis:   -12  Text Interpretation:  Sinus rhythm Abnormal R-wave progression, early  transition Probable left ventricular hypertrophy since last tracing no  significant change Confirmed by MILLER  MD, BRIAN ([id number]) on 05/04/2015  10:40:51 PM      MDM   Final diagnoses:  None    Pt has elevated LFT's with intentional OD of acetaminophen and will be admitted to stepdown for treatment, with a psych eval in the morning.  2300 - Poison Control was called and I spoke to SharonBernice, who instructed to administer NAC and follow up LFT's and INR after 24 hours.  If LFT's are increasing, continue NAC for another 24 hours. She stated that oral NAC is appropriate since the patient has not experienced nausea or abdominal pain.  She will be admitted to medicine for NAC and observation.  Pt is trying to leave, will be IVC'd by admitting physician   Danelle BerryLeisa Arcenio Mullaly, PA-C 05/09/15 2226  Eber HongBrian Miller, MD 05/10/15 [phone]

## 2015-05-05 ENCOUNTER — Encounter (HOSPITAL_COMMUNITY): Payer: Self-pay | Admitting: Internal Medicine

## 2015-05-05 DIAGNOSIS — F445 Conversion disorder with seizures or convulsions: Secondary | ICD-10-CM | POA: Diagnosis not present

## 2015-05-05 DIAGNOSIS — T391X2A Poisoning by 4-Aminophenol derivatives, intentional self-harm, initial encounter: Secondary | ICD-10-CM | POA: Diagnosis not present

## 2015-05-05 DIAGNOSIS — T50901A Poisoning by unspecified drugs, medicaments and biological substances, accidental (unintentional), initial encounter: Secondary | ICD-10-CM | POA: Diagnosis present

## 2015-05-05 DIAGNOSIS — M352 Behcet's disease: Secondary | ICD-10-CM

## 2015-05-05 DIAGNOSIS — T426X2A Poisoning by other antiepileptic and sedative-hypnotic drugs, intentional self-harm, initial encounter: Secondary | ICD-10-CM

## 2015-05-05 DIAGNOSIS — T424X2A Poisoning by benzodiazepines, intentional self-harm, initial encounter: Secondary | ICD-10-CM

## 2015-05-05 DIAGNOSIS — T391X4A Poisoning by 4-Aminophenol derivatives, undetermined, initial encounter: Secondary | ICD-10-CM | POA: Diagnosis not present

## 2015-05-05 DIAGNOSIS — F329 Major depressive disorder, single episode, unspecified: Secondary | ICD-10-CM | POA: Diagnosis not present

## 2015-05-05 DIAGNOSIS — Z634 Disappearance and death of family member: Secondary | ICD-10-CM

## 2015-05-05 LAB — PROTIME-INR
INR: 1.02 (ref 0.00–1.49)
PROTHROMBIN TIME: 13.6 s (ref 11.6–15.2)

## 2015-05-05 LAB — ACETAMINOPHEN LEVEL

## 2015-05-05 LAB — VALPROIC ACID LEVEL: Valproic Acid Lvl: <10 ug/mL — ABNORMAL LOW (ref 50.0–100.0)

## 2015-05-05 MED ORDER — TOPIRAMATE 100 MG PO TABS
100.0000 mg | ORAL_TABLET | Freq: Every day | ORAL | Status: DC
  Administered 2015-05-05 – 2015-05-06 (×2): 100 mg via ORAL
  Filled 2015-05-05 (×2): qty 1

## 2015-05-05 MED ORDER — ASPIRIN 81 MG PO CHEW
81.0000 mg | CHEWABLE_TABLET | Freq: Every morning | ORAL | Status: DC
  Administered 2015-05-05 – 2015-05-06 (×2): 81 mg via ORAL
  Filled 2015-05-05 (×2): qty 1

## 2015-05-05 MED ORDER — IBUPROFEN 200 MG PO TABS
600.0000 mg | ORAL_TABLET | Freq: Four times a day (QID) | ORAL | Status: DC | PRN
  Administered 2015-05-05: 600 mg via ORAL
  Filled 2015-05-05: qty 3

## 2015-05-05 MED ORDER — ONDANSETRON HCL 4 MG PO TABS
4.0000 mg | ORAL_TABLET | Freq: Three times a day (TID) | ORAL | Status: DC | PRN
  Administered 2015-05-05 – 2015-05-06 (×2): 4 mg via ORAL
  Filled 2015-05-05 (×3): qty 1

## 2015-05-05 MED ORDER — ESCITALOPRAM OXALATE 10 MG PO TABS
10.0000 mg | ORAL_TABLET | Freq: Every day | ORAL | Status: DC
  Administered 2015-05-05 – 2015-05-06 (×2): 10 mg via ORAL
  Filled 2015-05-05 (×2): qty 1

## 2015-05-05 MED ORDER — DIVALPROEX SODIUM 500 MG PO DR TAB
500.0000 mg | DELAYED_RELEASE_TABLET | Freq: Three times a day (TID) | ORAL | Status: DC
  Administered 2015-05-05 – 2015-05-06 (×4): 500 mg via ORAL
  Filled 2015-05-05 (×6): qty 1

## 2015-05-05 MED ORDER — ONDANSETRON HCL 4 MG/2ML IJ SOLN
4.0000 mg | Freq: Once | INTRAMUSCULAR | Status: AC
  Administered 2015-05-05: 4 mg via INTRAVENOUS
  Filled 2015-05-05: qty 2

## 2015-05-05 MED ORDER — LEVETIRACETAM 500 MG PO TABS
1000.0000 mg | ORAL_TABLET | Freq: Two times a day (BID) | ORAL | Status: DC
  Administered 2015-05-05 – 2015-05-06 (×3): 1000 mg via ORAL
  Filled 2015-05-05 (×5): qty 2

## 2015-05-05 MED ORDER — ATORVASTATIN CALCIUM 10 MG PO TABS
10.0000 mg | ORAL_TABLET | Freq: Every day | ORAL | Status: DC
  Administered 2015-05-05: 10 mg via ORAL
  Filled 2015-05-05: qty 1

## 2015-05-05 MED ORDER — PREDNISONE 10 MG PO TABS
10.0000 mg | ORAL_TABLET | Freq: Every day | ORAL | Status: DC
  Administered 2015-05-05 – 2015-05-06 (×2): 10 mg via ORAL
  Filled 2015-05-05 (×3): qty 1

## 2015-05-05 MED ORDER — ACETYLCYSTEINE 20 % IN SOLN
70.0000 mg/kg | RESPIRATORY_TRACT | Status: DC
  Administered 2015-05-05 – 2015-05-06 (×6): 6060 mg via ORAL
  Filled 2015-05-05 (×15): qty 40

## 2015-05-05 MED ORDER — HYDROXYCHLOROQUINE SULFATE 200 MG PO TABS
200.0000 mg | ORAL_TABLET | Freq: Two times a day (BID) | ORAL | Status: DC
  Administered 2015-05-05 – 2015-05-06 (×3): 200 mg via ORAL
  Filled 2015-05-05 (×4): qty 1

## 2015-05-05 MED ORDER — SODIUM CHLORIDE 0.9 % IJ SOLN
10.0000 mL | INTRAMUSCULAR | Status: DC | PRN
  Administered 2015-05-05 – 2015-05-06 (×3): 10 mL
  Filled 2015-05-05 (×3): qty 40

## 2015-05-05 NOTE — Consult Note (Signed)
[facility] Consult   Reason for Consult:  overdose Referring Physician:  EDP Patient Identification: Brenda Mcguire MRN:  [id number] Principal Diagnosis: <principal problem not specified> Diagnosis:   Patient Active Problem List   Diagnosis Date Noted  . Intentional acetaminophen overdose [T39.1X2A] 05/05/2015  . Acetaminophen overdose of undetermined intent [T39.1X4A] 05/05/2015  . Overdose [T50.901A] 05/05/2015  . 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 Acute respiratory acidosis [E87.2]   . Acute respiratory failure with hypoxia [J96.01] 10/28/2014  . NSTEMI (non-ST elevated myocardial infarction) [I21.4] 10/28/2014  . Seizure [R56.9] 10/27/2014  . Vision loss of right eye [H54.61] 10/23/2014  . Blurred vision [H53.8] 10/23/2014  . Acute loss of vision [H53.139] 10/23/2014  . Unequal pupils [H57.02]   . Status epilepticus [G40.301] 08/31/2014  . Acute respiratory failure [J96.00] 08/31/2014  . GERD (gastroesophageal reflux disease) [K21.9] 08/25/2013  . Insomnia [G47.00] 03/02/2013  . Seizures [R56.9] 03/02/2013  . Behcet's disease [M35.2] 03/02/2013    Total Time spent with patient: 45 minutes  Subjective:   Brenda Mcguire is a 54 y.o. female patient admitted with drug overdose.Marland Kitchen  HPI:  This patient is a 54 year old widowed white female who lives alone in [place]. 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 She was admitted after taking an overdose of Norco, xanax and [place]. The patient states her husband died suddenly  2023-04-07 of a respiratory infection while out of town. Since then she has been very sad, unable to sleep for several nights, crying. She claims she took extra meds in order to sleep but has no intention of killing herself. She has good social support from friends and her 2 daughters. She has a history of anxiety and has been on xanax for years but has no previous treatment for depression. She was just referred to a bereavement counselor. She has no  history of psychosis or substance abuse. HPI Elements:   Location:  global. Quality:  severe. Severity:  severe. Timing:  acute. Duration:  3 weeks. Context:  husband's recent death.  Past Medical History:  Past Medical History  Diagnosis Date  . GERD (gastroesophageal reflux disease)   . Hyperlipidemia   . History of blood transfusion   . Chronic headaches   . Phlebitis   . History of colonic polyps   . 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 Tonic clonic seizures     Pseudo-Seizure  . Anxiety     "situational stress"  . Kidney stones 9767,3419  . Myocardial infarction     Past Surgical History  Procedure Laterality Date  . Cholecystectomy    . Tonsillectomy  1970  . Appendectomy  1975  . Lumbar fusion  1999    L5-S1  . Abdominal hysterectomy  1988  . Portacath placement Left 2008  . Stone extraction with basket    . Back surgery      L5-S1 ALIF  . Adenoidectomy  at 54 years old  . Oophorectomy  1987  . Radiology with anesthesia N/A 10/24/2014    Procedure: RADIOLOGY WITH ANESTHESIA;  Surgeon: Medication Radiologist, MD;  Location: [facility];  Service: Radiology;  Laterality: N/A;   Family History:  Family History  Problem Relation Age of Onset  . Diabetes Mother   . Hyperlipidemia Mother   . Stroke Mother   . Hypertension Mother   . Colon cancer Mother   . Breast cancer Mother   . Early death Father   . Heart attack Father   .  Heart disease Father   . Hyperlipidemia Father   . COPD Sister   . Heart disease Maternal Grandmother   . Heart disease Maternal Grandfather   . Heart disease Paternal Grandmother   . Heart disease Paternal Grandfather    Social History:  History  Alcohol Use No     History  Drug Use No    History   Social History  . Marital Status: Married    Spouse Name: N/A  . Number of Children: 2  . Years of Education: 14   Occupational History  . Nurse     [employer]  .     Social History Main Topics  . Smoking status:  Never Smoker   . Smokeless tobacco: Never Used  . Alcohol Use: No  . Drug Use: No  . Sexual Activity: Yes    Birth Control/ Protection: Surgical   Other Topics Concern  . None   Social History Narrative   Lives with husband.  They have 2 grown children.   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 Hepatitis B Vaccine Hives and Shortness Of Breath    Causes wheezing   . Imitrex [Sumatriptan] Other (See Comments)    Makes her have SVT and chest pain  . Iohexol Anaphylaxis, Hives and Shortness Of Breath  . Codeine Itching and Rash    Labs:  Results for orders placed or performed during the hospital encounter of 05/04/15 (from the past 48 hour(s))  CBC     Status: None   Collection Time: 05/04/15 10:29 PM  Result Value Ref Range   WBC 6.3 4.0 - 10.5 K/uL   RBC 4.31 3.87 - 5.11 MIL/uL   Hemoglobin 13.1 12.0 - 15.0 g/dL   HCT 40.8 36.0 - 46.0 %   MCV 94.7 78.0 - 100.0 fL   MCH 30.4 26.0 - 34.0 pg   MCHC 32.1 30.0 - 36.0 g/dL   RDW 12.8 11.5 - 15.5 %   Platelets 249 150 - 400 K/uL  Comprehensive metabolic panel     Status: Abnormal   Collection Time: 05/04/15 10:29 PM  Result Value Ref Range   Sodium 142 135 - 145 mmol/L   Potassium 4.2 3.5 - 5.1 mmol/L   Chloride 108 101 - 111 mmol/L   CO2 23 22 - 32 mmol/L   Glucose, Bld 90 65 - 99 mg/dL   BUN 22 (H) 6 - 20 mg/dL   Creatinine, Ser 0.85 0.44 - 1.00 mg/dL   Calcium 9.4 8.9 - 10.3 mg/dL   Total Protein 8.0 6.5 - 8.1 g/dL   Albumin 4.6 3.5 - 5.0 g/dL   AST 175 (H) 15 - 41 U/L   ALT 202 (H) 14 - 54 U/L   Alkaline Phosphatase 107 38 - 126 U/L   Total Bilirubin 0.6 0.3 - 1.2 mg/dL   GFR calc non Af Amer >60 >60 mL/min    GFR calc Af Amer >60 >60 mL/min    Comment: (NOTE) The eGFR has been calculated using the CKD EPI equation. This calculation has not been  validated in all clinical situations. eGFR's persistently <60 mL/min signify possible Chronic Kidney Disease.    Anion gap 11 5 - 15  Ethanol (ETOH)     Status: None   Collection Time: 05/04/15 10:29 PM  Result Value Ref Range   Alcohol, Ethyl (B) <5 <5 mg/dL    Comment:        LOWEST DETECTABLE LIMIT FOR SERUM ALCOHOL IS 11 mg/dL FOR MEDICAL PURPOSES ONLY   Acetaminophen level     Status: Abnormal   Collection Time: 05/04/15 10:29 PM  Result Value Ref Range   Acetaminophen (Tylenol), Serum <10 (L) 10 - 30 ug/mL    Comment:        THERAPEUTIC CONCENTRATIONS VARY SIGNIFICANTLY. A RANGE OF 10-30 ug/mL MAY BE AN EFFECTIVE CONCENTRATION FOR MANY PATIENTS. HOWEVER, SOME ARE BEST TREATED AT CONCENTRATIONS OUTSIDE THIS RANGE. ACETAMINOPHEN CONCENTRATIONS >150 ug/mL AT 4 HOURS AFTER INGESTION AND >50 ug/mL AT 12 HOURS AFTER INGESTION ARE OFTEN ASSOCIATED WITH TOXIC REACTIONS.   Salicylate level     Status: None   Collection Time: 05/04/15 10:29 PM  Result Value Ref Range   Salicylate Lvl <7.0 2.8 - 30.0 mg/dL  Urine rapid drug screen (hosp performed)     Status: Abnormal   Collection Time: 05/04/15 10:40 PM  Result Value Ref Range   Opiates POSITIVE (A) NONE DETECTED   Cocaine NONE DETECTED NONE DETECTED   Benzodiazepines POSITIVE (A) NONE DETECTED   Amphetamines NONE DETECTED NONE DETECTED   Tetrahydrocannabinol NONE DETECTED NONE DETECTED   Barbiturates NONE DETECTED NONE DETECTED    Comment:        DRUG SCREEN FOR MEDICAL PURPOSES ONLY.  IF CONFIRMATION IS NEEDED FOR ANY PURPOSE, NOTIFY LAB WITHIN 5 DAYS.        LOWEST DETECTABLE LIMITS FOR URINE DRUG SCREEN Drug Class       Cutoff (ng/mL) Amphetamine      1000 Barbiturate      200 Benzodiazepine   623 Tricyclics       762 Opiates          300 Cocaine          300 THC               50   Urinalysis, Routine w reflex microscopic     Status: Abnormal   Collection Time: 05/04/15 10:40 PM  Result Value Ref Range   Color, Urine AMBER (A) YELLOW    Comment: BIOCHEMICALS MAY BE AFFECTED BY COLOR   APPearance CLEAR CLEAR   Specific Gravity, Urine 1.038 (H) 1.005 - 1.030   pH 5.5 5.0 - 8.0   Glucose, UA NEGATIVE NEGATIVE mg/dL   Hgb urine dipstick NEGATIVE NEGATIVE   Bilirubin Urine SMALL (A) NEGATIVE   Ketones, ur NEGATIVE NEGATIVE mg/dL   Protein, ur NEGATIVE NEGATIVE mg/dL   Urobilinogen, UA 1.0 0.0 - 1.0 mg/dL   Nitrite NEGATIVE NEGATIVE   Leukocytes, UA SMALL (A) NEGATIVE  Pregnancy, urine     Status: None   Collection Time: 05/04/15 10:40 PM  Result Value Ref Range   Preg Test, Ur NEGATIVE NEGATIVE    Comment:        THE SENSITIVITY OF THIS METHODOLOGY IS >20 mIU/mL.   Urine microscopic-add on     Status: Abnormal   Collection Time: 05/04/15 10:40 PM  Result Value Ref Range   Squamous Epithelial / LPF RARE RARE   WBC, UA 7-10 <3 WBC/hpf   RBC / HPF 3-6 <3 RBC/hpf  Bacteria, UA FEW (A) RARE  Protime-INR     Status: None   Collection Time: 05/05/15  2:45 AM  Result Value Ref Range   Prothrombin Time 13.6 11.6 - 15.2 seconds   INR 1.02 0.00 - 1.49  Valproic acid level     Status: Abnormal   Collection Time: 05/05/15  2:45 AM  Result Value Ref Range   Valproic Acid Lvl <10 (L) 50.0 - 100.0 ug/mL    Comment: RESULTS CONFIRMED BY MANUAL DILUTION Performed at [facility]   Acetaminophen level (recheck)     Status: Abnormal   Collection Time: 05/05/15  2:45 AM  Result Value Ref Range   Acetaminophen (Tylenol), Serum <10 (L) 10 - 30 ug/mL    Comment:        THERAPEUTIC CONCENTRATIONS VARY SIGNIFICANTLY. A RANGE OF 10-30 ug/mL MAY BE AN EFFECTIVE CONCENTRATION FOR MANY PATIENTS. HOWEVER, SOME ARE BEST TREATED AT CONCENTRATIONS OUTSIDE THIS RANGE. ACETAMINOPHEN CONCENTRATIONS >150 ug/mL AT 4 HOURS AFTER INGESTION AND >50 ug/mL AT  12 HOURS AFTER INGESTION ARE OFTEN ASSOCIATED WITH TOXIC REACTIONS.   Acetaminophen level     Status: Abnormal   Collection Time: 05/05/15  7:04 AM  Result Value Ref Range   Acetaminophen (Tylenol), Serum <10 (L) 10 - 30 ug/mL    Comment:        THERAPEUTIC CONCENTRATIONS VARY SIGNIFICANTLY. A RANGE OF 10-30 ug/mL MAY BE AN EFFECTIVE CONCENTRATION FOR MANY PATIENTS. HOWEVER, SOME ARE BEST TREATED AT CONCENTRATIONS OUTSIDE THIS RANGE. ACETAMINOPHEN CONCENTRATIONS >150 ug/mL AT 4 HOURS AFTER INGESTION AND >50 ug/mL AT 12 HOURS AFTER INGESTION ARE OFTEN ASSOCIATED WITH TOXIC REACTIONS.     Vitals: Blood pressure 110/95, pulse 94, temperature 98.6 F (37 C), temperature source Oral, resp. rate 16, height 5' 8"  (1.727 m), weight 87.5 kg (192 lb 14.4 oz), SpO2 99 %.  Risk to Self: Suicidal Ideation: No Suicidal Intent: No Is patient at risk for suicide?: No Suicidal Plan?: No Access to Means: No What has been your use of drugs/alcohol within the last 12 months?: No drugs or alcohol use reported.  How many times?: 0 Other Self Harm Risks: No other self harm risk reported at this time.  Triggers for Past Attempts: None known Intentional Self Injurious Behavior: None Risk to Others: Homicidal Ideation: No Thoughts of Harm to Others: No Current Homicidal Intent: No Current Homicidal Plan: No Access to Homicidal Means: No Identified Victim: NA History of harm to others?: No Assessment of Violence: On admission Violent Behavior Description: No violent behaviors observed. Pt is cooperative at this time.  Does patient have access to weapons?: No Criminal Charges Pending?: No Does patient have a court date: No Prior Inpatient Therapy: Prior Inpatient Therapy: No Prior Outpatient Therapy: Prior Outpatient Therapy: No Does patient have an ACCT team?: No Does patient have Intensive In-House Services?  : No Does patient have [facility] services? : No Does patient have [facility]  services?: No  Current Facility-Administered Medications  Medication Dose Route Frequency Provider Last Rate Last Dose  . acetylcysteine (MUCOMYST) 20 % nebulizer / oral solution 6,060 mg  70 mg/kg Oral Q4H Leann T Poindexter, RPH   6,060 mg at 05/05/15 0639  . atorvastatin (LIPITOR) tablet 10 mg  10 mg Oral Daily Toy Baker, MD   10 mg at 05/05/15 7939  . divalproex (DEPAKOTE) DR tablet 500 mg  500 mg Oral TID Toy Baker, MD   500 mg at 05/05/15 0300  . hydroxychloroquine (PLAQUENIL) tablet 200 mg  200 mg Oral BID Toy Baker, MD   200 mg at 05/05/15 0539  . levETIRAcetam (KEPPRA) tablet 1,000 mg  1,000 mg Oral BID Toy Baker, MD   1,000 mg at 05/05/15 7673  . predniSONE (DELTASONE) tablet 10 mg  10 mg Oral Q breakfast Toy Baker, MD   10 mg at 05/05/15 4193  . sodium chloride 0.9 % injection 10-40 mL  10-40 mL Intracatheter PRN Modena Jansky, MD   10 mL at 05/05/15 1037    Musculoskeletal: Strength & Muscle Tone: within normal limits Gait & Station: normal Patient leans: N/A  Psychiatric Specialty Exam: Physical Exam  Review of Systems  Psychiatric/Behavioral: Positive for depression and suicidal ideas.  All other systems reviewed and are negative.   Blood pressure 110/95, pulse 94, temperature 98.6 F (37 C), temperature source Oral, resp. rate 16, height 5' 8"  (1.727 m), weight 87.5 kg (192 lb 14.4 oz), SpO2 99 %.Body mass index is 29.34 kg/(m^2).  General Appearance: Fairly Groomed  Engineer, water::  Good  Speech:  Clear and Coherent  Volume:  Decreased  Mood:  Depressed, Dysphoric and Hopeless  Affect:  Constricted and Depressed  Thought Process:  Circumstantial and Coherent  Orientation:  Full (Time, Place, and Person)  Thought Content:  Rumination  Suicidal Thoughts:  Yes.  without intent/plan  Homicidal Thoughts:  No  Memory:  Immediate;   Good Recent;   Good Remote;   Good  Judgement:  Poor  Insight:  Fair  Psychomotor  Activity:  Decreased  Concentration:  Fair  Recall:  Good  Fund of Knowledge:Good  Language: Good  Akathisia:  No  Handed:  right  AIMS (if indicated):     Assets:  Communication Skills Desire for Improvement Resilience Social Support  ADL's:  Intact  Cognition: WNL  Sleep:   poor   Medical Decision Making: New problem, with additional work up planned, Review of Psycho-Social Stressors (1), Review and summation of old records (2) and Review of New Medication or Change in Dosage (2)  Treatment Plan Summary: Daily contact with patient to assess and evaluate symptoms and progress in treatment and Medication management  Plan:  Patient does not meet criteria for psychiatric inpatient admission. Disposition: Patient denies suicidal ideation at this time and is willing to start on antidepressant medication. Will start Lexapro, continue to follow  [place], [facility] 05/05/2015 10:44 AM

## 2015-05-05 NOTE — Progress Notes (Addendum)
PROGRESS NOTE    Brenda Mcguire RUE:[id number] DOB: 30-Mar-1961 DOA: 05/04/2015 PCP: Rockne Coons, DO  HPI/Brief narrative 54 year old female patient with history of Behcet's disease (used to follow with Dr. Edmonia James at [facility]), GERD, HLD, chronic headaches, seizures/pseudoseizures, anxiety, recently lost her husband when he unexpectedly died visiting his family in [place] city, since then patient has had significant difficulty sleeping, presented to the [facility] ED on 05/04/15 with polypharmacy (Narco, Xanax & Ambien) overdose. She states that she absolutely did not take these medications with intent to commit suicide. She gives history of her brother dying from suicide and the trauma that it put her and her family through and hence states she will never do something like that or put her family through it. She states that she took these medications only to sleep but may have unintentionally taken more in her sedated state. She cannot recollect how many pills she actually took. As per report, recount of pill bottles revealed missing 20 tablets of Narco, 10 tablets of Ambien and 15 tablets of Xanax-were last filled the day prior. She does not recollect who or when EMS came to pick her up. As per discussion with patient's daughter Ms. Amber on 5/22, she and her sister were trying to reach the patient yesterday morning and were unable to reach her whereby the sister called EMS. On arrival in the ED, she appeared in in inebriated, sleepy and tearful but denied suicidal ideation. She was borderline tachycardic and her liver enzymes were elevated concerning for acetaminophen overdose and was started on acetylcysteine therapy. Patient gives history of taking liberal amounts of extra strength Tylenol for ongoing right tooth pain/dental care recent, for the last 2 months.   Assessment/Plan:  Polypharmacy overdose (Narco, Xanax & Ambien)-unintentional - Patient has been taking  liberal amounts of Tylenol extra strength for dental carries for the last 2 months - She overdosed on an undetermined amount of above medications on 5/21-denies suicide attempt - Presented to the ED on 5/21 inebriated - Concern for acetaminophen toxicity. Acetaminophen levels 3 since admission have been low. Liver enzymes mildly abnormal (were normal in April 2016). No history of alcohol abuse. Due to concern for acetaminophen toxicity, started on acetylcysteine therapy. Will discuss with poison control regarding further management. - Psychiatric consulted due to concern for depression, prolonged grieving and patient safety - Currently alert and oriented. Hemodynamically stable. - Labs: INR 1.02, salicylate level <4, valproic acid level <10 & acetaminophen levels 3 < 10, urine pregnancy test: Negative, blood alcohol level <5, UDS positive for benzodiazepines and opiates only, urine microscopy not impressive for UTI  - Discussed with poison control center on 5/22 who recommended: Complete total 6 doses of oral Mucomyst and if repeat LFTs 5/23 AM are decreasing, INR <2 and no GI symptoms, may discontinue Mucomyst without any further intervention.  Prolonged grieving/rule out depression - Psychiatric consulted - Continue 1:1 safety sitter  Seizures/pseudoseizures - Continue home antiepileptics: Keppra and Depakote  Behcet's disease - Continue Plaquenil and prednisone  HLD - Hold statin secondary to abnormal LFTs  Dental caries - Outpatient follow-up with dentist for extraction  DVT prophylaxis: SCDs Code Status: Full  Family Communication: discussed in detail with patient's daughter Ms. Amber Disposition Plan: Pending psychiatric evaluation. Possible discharge home 5/23 pending psychiatric clearance and medical stability.   Consultants:  Psychiatry  Procedures:  None  Antibiotics:  None   Subjective: Patient clearly and repeatedly denies attempting suicide. Tearful,  anxious, upset  and wanting to go home but agreeable to stay until complete evaluation. Denies physical complaints apart from intermittent right tooth ache and headache.  Objective: Filed Vitals:   05/05/15 0727 05/05/15 0924 05/05/15 1034 05/05/15 1044  BP: 136/75 110/95  116/75  Pulse: 107 94  86  Temp:    98.7 F (37.1 C)  TempSrc:    Oral  Resp: 16 16  15   Height:   5\' 8"  (1.727 m)   Weight:   87.5 kg (192 lb 14.4 oz)   SpO2: 99% 99%  98%   No intake or output data in the 24 hours ending 05/05/15 1126 Filed Weights   05/05/15 1034  Weight: 87.5 kg (192 lb 14.4 oz)     Exam:  General exam: Moderately built and nourished pleasant middle-aged female lying comfortably supine in bed. Appears anxious, intermittently tearful. Respiratory system: Clear. No increased work of breathing. Cardiovascular system: S1 & S2 heard, RRR. No JVD, murmurs, gallops, clicks or pedal edema. Gastrointestinal system: Abdomen is nondistended, soft and nontender. Normal bowel sounds heard. Central nervous system: Alert and oriented. No focal neurological deficits. Extremities: Symmetric 5 x 5 power. Psychiatry: Appropriate and open to communication. Anxious and intermittently tearful.   Data Reviewed: Basic Metabolic Panel:  Recent Labs Lab 05/04/15 2229  NA 142  K 4.2  CL 108  CO2 23  GLUCOSE 90  BUN 22*  CREATININE 0.85  CALCIUM 9.4   Liver Function Tests:  Recent Labs Lab 05/04/15 2229  AST 175*  ALT 202*  ALKPHOS 107  BILITOT 0.6  PROT 8.0  ALBUMIN 4.6   No results for input(s): LIPASE, AMYLASE in the last 168 hours. No results for input(s): AMMONIA in the last 168 hours. CBC:  Recent Labs Lab 05/04/15 2229  WBC 6.3  HGB 13.1  HCT 40.8  MCV 94.7  PLT 249   Cardiac Enzymes: No results for input(s): CKTOTAL, CKMB, CKMBINDEX, TROPONINI in the last 168 hours. BNP (last 3 results) No results for input(s): PROBNP in the last 8760 hours. CBG: No results for input(s):  GLUCAP in the last 168 hours.  No results found for this or any previous visit (from the past 240 hour(s)).     Studies: No results found.      Scheduled Meds: . acetylcysteine  70 mg/kg Oral Q4H  . aspirin  81 mg Oral q morning - 10a  . divalproex  500 mg Oral TID  . escitalopram  10 mg Oral Daily  . hydroxychloroquine  200 mg Oral BID  . levETIRAcetam  1,000 mg Oral BID  . predniSONE  10 mg Oral Q breakfast  . topiramate  100 mg Oral Daily   Continuous Infusions:   Active Problems:   Insomnia   Behcet's disease   Pseudoseizures   Intentional acetaminophen overdose   Acetaminophen overdose of undetermined intent   Overdose    Time spent: 40 minutes.    Marcellus ScottHONGALGI,Remmy Riffe, MD, FACP, FHM. [facility] Pager [phone]  If 7PM-7AM, please contact night-coverage [email or web address] Password [email or web address] 05/05/2015, 11:26 AM    LOS: 0 days

## 2015-05-05 NOTE — Progress Notes (Signed)
MEDICATION RELATED CONSULT NOTE - INITIAL   Pharmacy Consult for Acetylcysteine Indication: Overdose  Allergies  Allergen Reactions  . Hepatitis B Vaccine Hives and Shortness Of Breath    Causes wheezing   . Imitrex [Sumatriptan] Other (See Comments)    Makes her have SVT and chest pain  . Iohexol Anaphylaxis, Hives and Shortness Of Breath  . Codeine Itching and Rash    Patient Measurements:   Weight : 86.5 kg  Vital Signs: Temp: 98.6 F (37 C) (05/21 2136) Temp Source: Oral (05/21 2136) BP: 111/61 mmHg (05/22 0121) Pulse Rate: 84 (05/22 0145) Intake/Output from previous day:   Intake/Output from this shift:    Labs:  Recent Labs  05/04/15 2229  WBC 6.3  HGB 13.1  HCT 40.8  PLT 249  CREATININE 0.85  ALBUMIN 4.6  PROT 8.0  AST 175*  ALT 202*  ALKPHOS 107  BILITOT 0.6   Estimated Creatinine Clearance: 90.1 mL/min (by C-G formula based on Cr of 0.85).   Microbiology: No results found for this or any previous visit (from the past 720 hour(s)).  Medical History: Past Medical History  Diagnosis Date  . GERD (gastroesophageal reflux disease)   . Hyperlipidemia   . History of blood transfusion   . Chronic headaches   . Phlebitis   . History of colonic polyps   . 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 Tonic clonic seizures     Pseudo-Seizure  . Anxiety     "situational stress"  . Kidney stones [id number]  . Myocardial infarction     Medications:  Scheduled:  . acetylcysteine  70 mg/kg Oral Q4H   Infusions:    Assessment:  [age] yr female presents with suspected overdose of Norco, Ambien and Xanax.  Noted patient also been taking acetaminophen for pain due to tooth abscess.  Initial APAP level < 10  AST/ALT elevated  [facility]  Pharmacy consulted to assist with dosing of acetylcysteine for overdose  Loading dose of 140mg /kg PO given in Ed @ 00:59  Goal of Therapy:  Follow labs  Plan:   Acetylcysteine 70  mg/kg PO q4h  Follow AM CMet, INR, APAP level  Clayton Bosserman, Joselyn GlassmanLeann Trefz, PharmD 05/05/2015,2:09 AM

## 2015-05-05 NOTE — H&P (Signed)
PCP:  Nicki Reaper, NP [facility]   Referring provider MIller   Chief Complaint:  took extra Xanax, Norco and Janit Bern  HPI: Brenda Mcguire is a 54 y.o. female   has a past medical history of GERD (gastroesophageal reflux disease); Hyperlipidemia; History of blood transfusion; Chronic headaches; Phlebitis; History of colonic polyps; Behcet's disease; Tonic clonic seizures; Anxiety; Kidney stones (1610,9604); and Myocardial infarction.   Patient reports that she has lost her husband about 3 weeks ago due to sudden death. She has tooth abscess for which she used to be treated with antibiotics but continues to have severe pain she was taken Tylenol reports extra strength 2 tablets every 4 hours while awake and hasn't been sleeping for the past 5 days. Today she presented to her primary care provider and requested refills for Xanax, Ambien and Vicodin. A shin states that she wanted to sleep and has taken some extra dosages. On the recount it was found that 20 tablets of Norco were missing, 10 of Ambien and 15 Xanax. She denies that she was attempting to hurt herself. 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 As this patient refused to be admitted and wished to go home. She refused Mucomyst initially.   Given her risk factors such as that her husband has recently died and brother committed suicide patient considered to be unsafe for discharge. After extensive discussion she agreed to stay and taken medications. Hospitalist was called for admission for Acetaminophen overdose  Review of Systems:    Pertinent positives include: toothache  Constitutional:  No weight loss, night sweats, Fevers, chills, fatigue, weight loss  HEENT:  No headaches, Difficulty swallowing,Tooth/dental problems,Sore throat,  No sneezing, itching, ear ache, nasal congestion, post nasal drip,  Cardio-vascular:  No chest pain, Orthopnea, PND, anasarca, dizziness, palpitations.no Bilateral  lower extremity swelling  GI:  No heartburn, indigestion, abdominal pain, nausea, vomiting, diarrhea, change in bowel habits, loss of appetite, melena, blood in stool, hematemesis Resp:  no shortness of breath at rest. No dyspnea on exertion, No excess mucus, no productive cough, No non-productive cough, No coughing up of blood.No change in color of mucus.No wheezing. Skin:  no rash or lesions. No jaundice GU:  no dysuria, change in color of urine, no urgency or frequency. No straining to urinate.  No flank pain.  Musculoskeletal:  No joint pain or no joint swelling. No decreased range of motion. No back pain.  Psych:  No change in mood or affect. No depression or anxiety. No memory loss.  Neuro: no localizing neurological complaints, no tingling, no weakness, no double vision, no gait abnormality, no slurred speech, no confusion  Otherwise ROS are negative except for above, 10 systems were reviewed  Past Medical History: Past Medical History  Diagnosis Date  . GERD (gastroesophageal reflux disease)   . Hyperlipidemia   . History of blood transfusion   . Chronic headaches   . Phlebitis   . History of colonic polyps   . 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 Tonic clonic seizures     Pseudo-Seizure  . Anxiety     "situational stress"  . Kidney stones [id number]  . Myocardial infarction    Past Surgical History  Procedure Laterality Date  . Cholecystectomy    . Tonsillectomy  1970  . Appendectomy  1975  . Lumbar fusion  1999    L5-S1  . Abdominal hysterectomy  1988  . Portacath placement Left 2008  .  Stone extraction with basket    . Back surgery      L5-S1 ALIF  . Adenoidectomy  at 54 years old  . Oophorectomy  1987  . Radiology with anesthesia N/A 10/24/2014    Procedure: RADIOLOGY WITH ANESTHESIA;  Surgeon: Medication Radiologist, MD;  Location: [facility] OR;  Service: Radiology;  Laterality: N/A;     Medications: Prior to Admission medications   Medication Sig Start  Date End Date Taking? Authorizing Provider  ALPRAZolam Prudy Feeler(XANAX) 0.5 MG tablet Take 1 tablet (0.5 mg total) by mouth 3 (three) times daily as needed for anxiety. 05/03/15  Yes Thao P Le, DO  aspirin 81 MG tablet Take 81 mg by mouth every morning.    Yes Historical Provider, MD  atorvastatin (LIPITOR) 10 MG tablet Take 1 tablet (10 mg total) by mouth daily. 05/03/15  Yes Thao P Le, DO  cetirizine (ZYRTEC) 10 MG tablet Take 10 mg by mouth daily as needed for allergies.   Yes Historical Provider, MD  divalproex (DEPAKOTE) 250 MG DR tablet Take 2 tablets (500 mg total) by mouth 3 (three) times daily. 09/02/14  Yes Jeanella CrazeBrandi L Ollis, NP  gabapentin (NEURONTIN) 300 MG capsule TAKE 3 CAPSULES (900 MG TOTAL) BY MOUTH 3 (THREE) TIMES DAILY. 04/22/15  Yes Van ClinesKaren M Aquino, MD  HYDROcodone-acetaminophen (NORCO/VICODIN) 5-325 MG per tablet Take 1 tablet by mouth 3 (three) times daily as needed for moderate pain. 05/03/15  Yes Morrell RiddleSarah L Weber, PA-C  hydroxychloroquine (PLAQUENIL) 200 MG tablet Take 1 tablet (200 mg total) by mouth 2 (two) times daily. 05/03/15  Yes Thao P Le, DO  levETIRAcetam (KEPPRA) 500 MG tablet Take 2 tablets (1,000 mg total) by mouth 2 (two) times daily. 01/18/15  Yes Jeanella CrazeBrandi L Ollis, NP  predniSONE (DELTASONE) 10 MG tablet Take 1 tablet (10 mg total) by mouth daily with breakfast. 05/03/15  Yes Thao P Le, DO  topiramate (TOPAMAX) 100 MG tablet Take 1 tablet (100 mg total) by mouth daily. 01/03/15  Yes Van ClinesKaren M Aquino, MD  zolpidem (AMBIEN) 10 MG tablet Take 1 tablet (10 mg total) by mouth at bedtime as needed for sleep. 05/03/15  Yes Thao P Le, DO  cyclophosphamide (CYTOXAN) 50 MG tablet Take 150 mg by mouth daily. Give on an empty stomach 1 hour before or 2 hours after meals.    Historical Provider, MD  promethazine (PHENERGAN) 25 MG tablet Take 25 mg by mouth every 6 (six) hours as needed for nausea or vomiting.  01/03/15   Historical Provider, MD  sulfamethoxazole-trimethoprim (BACTRIM,SEPTRA) 400-80 MG per  tablet Take 1 tablet by mouth 3 (three) times a week. 01/18/15   Jeanella CrazeBrandi L Ollis, NP    Allergies:   Allergies  Allergen Reactions  . Hepatitis B Vaccine Hives and Shortness Of Breath    Causes wheezing   . Imitrex [Sumatriptan] Other (See Comments)    Makes her have SVT and chest pain  . Iohexol Anaphylaxis, Hives and Shortness Of Breath  . Codeine Itching and Rash    Social History:  Ambulatory   independently   Lives at home alone,  Recently lost her Husband     reports that she has never smoked. She has never used smokeless tobacco. She reports that she does not drink alcohol or use illicit drugs.    Family History: family history includes Breast cancer in her mother; COPD in her sister; Colon cancer in her mother; Diabetes in her mother; Early death in her father; Heart attack in her  father; Heart disease in her father, maternal grandfather, maternal grandmother, paternal grandfather, and paternal grandmother; Hyperlipidemia in her father and mother; Hypertension in her mother; Stroke in her mother.    Physical Exam: Patient Vitals for the past 24 hrs:  BP Temp Temp src Pulse Resp SpO2  05/04/15 2330 - - - 96 - 99 %  05/04/15 2300 - - - 97 15 99 %  05/04/15 2136 150/77 mmHg 98.6 F (37 C) Oral 107 22 98 %    1. General:  in No Acute distress 2. Psychological: Alert and  Oriented tearful,  3. Head/ENT:   Moist  Mucous Membranes                          Head Non traumatic, neck supple                            Poor Dentition 4. SKIN:   decreased Skin turgor,  Skin clean Dry and intact no rash 5. Heart: Regular rate and rhythm no Murmur, Rub or gallop 6. Lungs: Clear to auscultation bilaterally, no wheezes or crackles   7. Abdomen: Soft, non-tender, Non distended 8. Lower extremities: no clubbing, cyanosis, or edema 9. Neurologically Grossly intact, moving all 4 extremities equally 10. MSK: Normal range of motion  body mass index is unknown because there is no weight  on file.   Labs on Admission:   Results for orders placed or performed during the hospital encounter of 05/04/15 (from the past 24 hour(s))  CBC     Status: None   Collection Time: 05/04/15 10:29 PM  Result Value Ref Range   WBC 6.3 4.0 - 10.5 K/uL   RBC 4.31 3.87 - 5.11 MIL/uL   Hemoglobin 13.1 12.0 - 15.0 g/dL   HCT 16.1 09.6 - 04.5 %   MCV 94.7 78.0 - 100.0 fL   MCH 30.4 26.0 - 34.0 pg   MCHC 32.1 30.0 - 36.0 g/dL   RDW 40.9 81.1 - 91.4 %   Platelets 249 150 - 400 K/uL  Comprehensive metabolic panel     Status: Abnormal   Collection Time: 05/04/15 10:29 PM  Result Value Ref Range   Sodium 142 135 - 145 mmol/L   Potassium 4.2 3.5 - 5.1 mmol/L   Chloride 108 101 - 111 mmol/L   CO2 23 22 - 32 mmol/L   Glucose, Bld 90 65 - 99 mg/dL   BUN 22 (H) 6 - 20 mg/dL   Creatinine, Ser 7.82 0.44 - 1.00 mg/dL   Calcium 9.4 8.9 - 95.6 mg/dL   Total Protein 8.0 6.5 - 8.1 g/dL   Albumin 4.6 3.5 - 5.0 g/dL   AST 213 (H) 15 - 41 U/L   ALT 202 (H) 14 - 54 U/L   Alkaline Phosphatase 107 38 - 126 U/L   Total Bilirubin 0.6 0.3 - 1.2 mg/dL   GFR calc non Af Amer >60 >60 mL/min   GFR calc Af Amer >60 >60 mL/min   Anion gap 11 5 - 15  Ethanol (ETOH)     Status: None   Collection Time: 05/04/15 10:29 PM  Result Value Ref Range   Alcohol, Ethyl (B) <5 <5 mg/dL  Acetaminophen level     Status: Abnormal   Collection Time: 05/04/15 10:29 PM  Result Value Ref Range   Acetaminophen (Tylenol), Serum <10 (L) 10 - 30 ug/mL  Salicylate level  Status: None   Collection Time: 05/04/15 10:29 PM  Result Value Ref Range   Salicylate Lvl <4.0 2.8 - 30.0 mg/dL  Urine rapid drug screen (hosp performed)     Status: Abnormal   Collection Time: 05/04/15 10:40 PM  Result Value Ref Range   Opiates POSITIVE (A) NONE DETECTED   Cocaine NONE DETECTED NONE DETECTED   Benzodiazepines POSITIVE (A) NONE DETECTED   Amphetamines NONE DETECTED NONE DETECTED   Tetrahydrocannabinol NONE DETECTED NONE DETECTED    Barbiturates NONE DETECTED NONE DETECTED  Urinalysis, Routine w reflex microscopic     Status: Abnormal   Collection Time: 05/04/15 10:40 PM  Result Value Ref Range   Color, Urine AMBER (A) YELLOW   APPearance CLEAR CLEAR   Specific Gravity, Urine 1.038 (H) 1.005 - 1.030   pH 5.5 5.0 - 8.0   Glucose, UA NEGATIVE NEGATIVE mg/dL   Hgb urine dipstick NEGATIVE NEGATIVE   Bilirubin Urine SMALL (A) NEGATIVE   Ketones, ur NEGATIVE NEGATIVE mg/dL   Protein, ur NEGATIVE NEGATIVE mg/dL   Urobilinogen, UA 1.0 0.0 - 1.0 mg/dL   Nitrite NEGATIVE NEGATIVE   Leukocytes, UA SMALL (A) NEGATIVE  Pregnancy, urine     Status: None   Collection Time: 05/04/15 10:40 PM  Result Value Ref Range   Preg Test, Ur NEGATIVE NEGATIVE  Urine microscopic-add on     Status: Abnormal   Collection Time: 05/04/15 10:40 PM  Result Value Ref Range   Squamous Epithelial / LPF RARE RARE   WBC, UA 7-10 <3 WBC/hpf   RBC / HPF 3-6 <3 RBC/hpf   Bacteria, UA FEW (A) RARE    UA  7-10 WBC few bacteria  Lab Results  Component Value Date   HGBA1C 5.8* 10/23/2014    Estimated Creatinine Clearance: 90.1 mL/min (by C-G formula based on Cr of 0.85).  BNP (last 3 results) No results for input(s): PROBNP in the last 8760 hours.  Other results:  I have pearsonaly reviewed this: ECG REPORT  Rate: 94  Rhythm: NSR ST&T Change: no ischemia QTC 471  There were no vitals filed for this visit.   Cultures:    Component Value Date/Time   SDES URINE, CLEAN CATCH 10/27/2014 0708   SPECREQUEST NONE 10/27/2014 0708   CULT NO GROWTH Performed at [facility]  10/27/2014 0708   REPTSTATUS 10/28/2014 FINAL 10/27/2014 0708     Radiological Exams on Admission: No results found.  Chart has been reviewed  Family not at  Bedside     Assessment/Plan  54 yo F w hx of pseudoseizures Y intubation in the past and Behcet's disease on chronic immunosuppressants status post Port-A-Cath placement, was brought from home  after EMS found her sleeping patient admitted taking extra Vicodin, Ambien and Xanax he attempts to help her sleep after recent death of her husband. First Acetaminophen level undetectable but noted to have elevated LFTs patient was given oral Mucomyst and emerge department and admitted to the stepdown for monitoring for acetaminophen overdose as well as benzodiazepines overdose  Present on Admission:  . Acetaminophen overdose of undetermined intent - patient this point is agreeable to staple admission. We'll continue Mucomyst by mouth. Poison Control Center has been notified. Continue C set precautions until patient is cleared by psychiatry. Psychiatric consult has been requested. We'll obtain serial acetaminophen level as well as INR and LFTs  . Pseudoseizures continue her home medications  . Behcet's disease continue Plaquenil    Prophylaxis: SCD    CODE STATUS:  FULL CODE  as per patient    Disposition:   To home once workup is complete and patient is stable if cleared by psychiatry  Other plan as per orders.  I have spent a total of 75  min on this admission extra time was taken to discuss case with pharmacy as well as Behavioral Health  Brenda Mcguire 05/05/2015, 12:37 AM  [facility]  Pager [phone]   after 2 AM please page floor coverage PA If 7AM-7PM, please contact the day team taking care of the patient  [email or web address]  Password [email or web address]

## 2015-05-05 NOTE — ED Notes (Addendum)
Pt upset about belongings being taken out of room. She states "no body believes me". Pt states "I'm not suicidal".  Pt tearful. She was made aware that she is going to be admitted to the critical care unit however, patient states "y'all are going to lock me up in the psych ward".

## 2015-05-05 NOTE — ED Notes (Signed)
Psych team at bedside .

## 2015-05-05 NOTE — ED Notes (Signed)
She is very manipulative and tearful; demanding to go home.  I pointedly instruct her in the fact that if she would attempt to leave; we would notify security and police to detain her per our physician's instruction.  She then repeatedly accuses us of "not listening to me".  I inform her that we will keep her safe and she is to be admitted and re-iterate that we will neither countenance nor discuss her leaving.  She ambulates without difficulty and is in no distress.  She is tearful and her skin is normal, warm and dry and she is breathing normally.

## 2015-05-05 NOTE — ED Notes (Signed)
Bed: WA16 Expected date:  Expected time:  Means of arrival:  Comments: 

## 2015-05-05 NOTE — ED Notes (Signed)
Dr. Doutova at bedside.  

## 2015-05-06 DIAGNOSIS — Z634 Disappearance and death of family member: Secondary | ICD-10-CM | POA: Diagnosis not present

## 2015-05-06 DIAGNOSIS — F322 Major depressive disorder, single episode, severe without psychotic features: Secondary | ICD-10-CM | POA: Diagnosis not present

## 2015-05-06 DIAGNOSIS — T391X4D Poisoning by 4-Aminophenol derivatives, undetermined, subsequent encounter: Secondary | ICD-10-CM | POA: Diagnosis not present

## 2015-05-06 DIAGNOSIS — T391X2A Poisoning by 4-Aminophenol derivatives, intentional self-harm, initial encounter: Secondary | ICD-10-CM | POA: Diagnosis not present

## 2015-05-06 DIAGNOSIS — T424X2A Poisoning by benzodiazepines, intentional self-harm, initial encounter: Secondary | ICD-10-CM | POA: Diagnosis not present

## 2015-05-06 LAB — COMPREHENSIVE METABOLIC PANEL
ALK PHOS: 100 U/L (ref 38–126)
ALT: 114 U/L — ABNORMAL HIGH (ref 14–54)
ANION GAP: 13 (ref 5–15)
AST: 44 U/L — ABNORMAL HIGH (ref 15–41)
Albumin: 3.7 g/dL (ref 3.5–5.0)
BUN: 18 mg/dL (ref 6–20)
CO2: 19 mmol/L — ABNORMAL LOW (ref 22–32)
Calcium: 9 mg/dL (ref 8.9–10.3)
Chloride: 108 mmol/L (ref 101–111)
Creatinine, Ser: 0.71 mg/dL (ref 0.44–1.00)
GFR calc non Af Amer: >60 mL/min (ref >60)
Glucose, Bld: 107 mg/dL — ABNORMAL HIGH (ref 65–99)
POTASSIUM: 3.6 mmol/L (ref 3.5–5.1)
Sodium: 140 mmol/L (ref 135–145)
Total Bilirubin: 0.4 mg/dL (ref 0.3–1.2)
Total Protein: 7.3 g/dL (ref 6.5–8.1)

## 2015-05-06 LAB — PROTIME-INR
INR: 1.14 (ref 0.00–1.49)
Prothrombin Time: 14.8 seconds (ref 11.6–15.2)

## 2015-05-06 MED ORDER — HEPARIN SOD (PORK) LOCK FLUSH 100 UNIT/ML IV SOLN
500.0000 [IU] | INTRAVENOUS | Status: AC | PRN
  Administered 2015-05-06: 500 [IU]

## 2015-05-06 MED ORDER — ESCITALOPRAM OXALATE 10 MG PO TABS: 10.0000 mg | ORAL_TABLET | Freq: Every day | ORAL | Status: AC

## 2015-05-06 NOTE — Discharge Summary (Signed)
Physician Discharge Summary  Brenda Mcguire ZOX:[id number] DOB: July 29, 1961 DOA: 05/04/2015  PCP: Rockne Coons, DO  Admit date: 05/04/2015 Discharge date: 05/06/2015  Time spent: Less than 30 minutes  Recommendations for Outpatient Follow-up:  1. Dr. Hamilton Capri, PCP in 3-5 days with repeat labs (LFTs). 2. Patient advised to follow-up with dentist of her choice regarding evaluation and management of right lower toothache.  Discharge Diagnoses:  Principal Problem:   MDD (major depressive episode), single episode, severe, no psychosis Active Problems:   Insomnia   Behcet's disease   Pseudoseizures   Intentional acetaminophen overdose   Acetaminophen overdose of undetermined intent   Overdose   Discharge Condition: Improved & Stable  Diet recommendation: Heart healthy diet.  Filed Weights   05/05/15 1034  Weight: 87.5 kg (192 lb 14.4 oz)    History of present illness:  54 year old female patient with history of Behcet's disease (used to follow with Dr. Edmonia James at [facility]), GERD, HLD, chronic headaches, seizures/pseudoseizures, anxiety, recently lost her husband when he unexpectedly died visiting his family in [place] city, since then patient has had significant difficulty sleeping, presented to the [facility] ED on 05/04/15 with polypharmacy (Narco, Xanax & Ambien) overdose. She states that she absolutely did not take these medications with intent to commit suicide. She gives history of her brother dying from suicide and the trauma that it put her and her family through and hence states she will never do something like that or put her family through it. She states that she took these medications only to sleep but may have unintentionally taken more in her sedated state. She cannot recollect how many pills she actually took. As per report, recount of pill bottles revealed missing 20 tablets of Narco, 10 tablets of Ambien and 15 tablets of Xanax-were last filled the  day prior. She does not recollect who or when EMS came to pick her up. As per discussion with patient's daughter Ms. [name] on 5/22, she and her sister were trying to reach the patient yesterday morning and were unable to reach her whereby the sister called EMS. On arrival in the ED, she appeared in in inebriated, sleepy and tearful but denied suicidal ideation. She was borderline tachycardic and her liver enzymes were elevated concerning for acetaminophen overdose and was started on acetylcysteine therapy. Patient gives history of taking liberal amounts of extra strength Tylenol for ongoing right tooth pain/dental care recent, for the last 2 months.  Hospital Course:   Polypharmacy overdose (Narco, Xanax & Ambien)-unintentional - Patient has been taking liberal amounts of Tylenol extra strength for dental carries for the last 2 months - She overdosed on an undetermined amount of above medications on 5/21-denies suicide attempt - Presented to the ED on 5/21 inebriated - Concern for acetaminophen toxicity. Acetaminophen levels 3 since admission have been low. Liver enzymes mildly abnormal (were normal in April 2016). No history of alcohol abuse. Due to concern for acetaminophen toxicity, started on acetylcysteine therapy.  - Psychiatric consulted due to concern for depression, prolonged grieving and patient safety - Currently alert and oriented. Hemodynamically stable. - Labs: INR 1.02, salicylate level <4, valproic acid level <10 & acetaminophen levels 3 < 10, urine pregnancy test: Negative, blood alcohol level <5, UDS positive for benzodiazepines and opiates only, urine microscopy not impressive for UTI  - Discussed with poison control center on 5/22 who recommended: Complete total 6 doses of oral Mucomyst and if repeat LFTs 5/23 AM are decreasing, INR <2  and no GI symptoms, may discontinue Mucomyst without any further intervention. - Patient has completed course of Mucomyst. Discussed with poison  control center on 5/23. Since LFTs have significantly improved, INR normal and patient has no symptoms, okay to DC Mucomyst and discharge home. - Patient to follow-up with PCP in 3-5 days with repeat LFTs. Statins have been temporarily held due to abnormal LFTs and may be resumed during outpatient follow-up when LFTs normalize.  - Discussed with psychiatrist on 5/23 who cleared her for discharge  Prolonged grieving/depression - Psychiatric consultation and follow-up appreciated - She was placed on 1:1 safety sitter - Patient was newly started on Lexapro. She was offered outpatient follow-up at the behavioral health center which she declined but is agreeable to go to outpatient bereavement counseling and social worker is assisting with same. - Patient also states that her daughter Ms. [name] will stay with her for the next 1-2 weeks. Patient was counseled to avoid situations leading to accidental drug overdose. She was advised to give her medications to her daughter who could give them to the patient when they were due thereby reducing the risk of overdose. She verbalized understanding. - Patient was also advised to follow-up with her PCP regarding optimizing/reducing some of the medications if they were not absolutely needed. Patient stated that most of the medications i.e. Depakote, gabapentin, Keppra, Topamax, prednisone and chlorpheniramine, she has been on for a long time.  Seizures/pseudoseizures - Continue home antiepileptics: Keppra and Depakote - No seizures this hospitalization.  Behcet's disease - Continue Plaquenil and prednisone  HLD - Hold statin secondary to abnormal LFTs. May resume when LFTs normalize.  Dental caries - Outpatient follow-up with dentist for extraction  Consultants:  Psychiatry  Procedures:  None  Discharge Exam:  Complaints:  Denies complaints and is anxious to go home. Never had suicidal, homicidal ideations, delusions or hallucinations during course  of this hospitalization.  Filed Vitals:   05/05/15 1044 05/05/15 1430 05/05/15 2120 05/06/15 0543  BP: 116/75 123/78 130/80 130/91  Pulse: 86 102 78 85  Temp: 98.7 F (37.1 C) 98.4 F (36.9 C) 97.8 F (36.6 C) 98.1 F (36.7 C)  TempSrc: Oral Oral Oral Oral  Resp: 15 18 18 18   Height:      Weight:      SpO2: 98% 94% 94% 100%    General exam: Moderately built and nourished pleasant middle-aged female lying comfortably supine in bed. Appears much improved compared to yesterday. Pleasant, smiling. Not anxious or tearful like yesterday. Respiratory system: Clear. No increased work of breathing. Cardiovascular system: S1 & S2 heard, RRR. No JVD, murmurs, gallops, clicks or pedal edema. Telemetry: Sinus rhythm. Gastrointestinal system: Abdomen is nondistended, soft and nontender. Normal bowel sounds heard. Central nervous system: Alert and oriented. No focal neurological deficits. Extremities: Symmetric 5 x 5 power. Psychiatry: As above.  Discharge Instructions      Discharge Instructions    Call MD for:  extreme fatigue    Complete by:  As directed      Call MD for:  persistant dizziness or light-headedness    Complete by:  As directed      Call MD for:  persistant nausea and vomiting    Complete by:  As directed      Call MD for:  severe uncontrolled pain    Complete by:  As directed      Diet - low sodium heart healthy    Complete by:  As directed  Increase activity slowly    Complete by:  As directed             Medication List    STOP taking these medications        atorvastatin 10 MG tablet  Commonly known as:  LIPITOR      TAKE these medications        ALPRAZolam 0.5 MG tablet  Commonly known as:  XANAX  Take 1 tablet (0.5 mg total) by mouth 3 (three) times daily as needed for anxiety.     aspirin 81 MG tablet  Take 81 mg by mouth every morning.     cetirizine 10 MG tablet  Commonly known as:  ZYRTEC  Take 10 mg by mouth daily as needed for  allergies.     divalproex 250 MG DR tablet  Commonly known as:  DEPAKOTE  Take 2 tablets (500 mg total) by mouth 3 (three) times daily.     escitalopram 10 MG tablet  Commonly known as:  LEXAPRO  Take 1 tablet (10 mg total) by mouth daily.     gabapentin 300 MG capsule  Commonly known as:  NEURONTIN  TAKE 3 CAPSULES (900 MG TOTAL) BY MOUTH 3 (THREE) TIMES DAILY.     HYDROcodone-acetaminophen 5-325 MG per tablet  Commonly known as:  NORCO/VICODIN  Take 1 tablet by mouth 3 (three) times daily as needed for moderate pain.     hydroxychloroquine 200 MG tablet  Commonly known as:  PLAQUENIL  Take 1 tablet (200 mg total) by mouth 2 (two) times daily.     levETIRAcetam 500 MG tablet  Commonly known as:  KEPPRA  Take 2 tablets (1,000 mg total) by mouth 2 (two) times daily.     predniSONE 10 MG tablet  Commonly known as:  DELTASONE  Take 1 tablet (10 mg total) by mouth daily with breakfast.     promethazine 25 MG tablet  Commonly known as:  PHENERGAN  Take 25 mg by mouth every 6 (six) hours as needed for nausea or vomiting.     topiramate 100 MG tablet  Commonly known as:  TOPAMAX  Take 1 tablet (100 mg total) by mouth daily.     zolpidem 10 MG tablet  Commonly known as:  AMBIEN  Take 1 tablet (10 mg total) by mouth at bedtime as needed for sleep.       Follow-up Information    Follow up with LE, THAO PHUONG, DO. Schedule an appointment as soon as possible for a visit in 3 days.   Specialty:  Family Medicine   Why:  To be seen in 3-5 days with repeat labs (LFTs).   Contact information:   [address] [place] [address] [phone]        The results of significant diagnostics from this hospitalization (including imaging, microbiology, ancillary and laboratory) are listed below for reference.    Significant Diagnostic Studies: No results found.  Microbiology: No results found for this or any previous visit (from the past 240 hour(s)).   Labs: Basic  Metabolic Panel:  Recent Labs Lab 05/04/15 2229 05/06/15 0620  NA 142 140  K 4.2 3.6  CL 108 108  CO2 23 19*  GLUCOSE 90 107*  BUN 22* 18  CREATININE 0.85 0.71  CALCIUM 9.4 9.0   Liver Function Tests:  Recent Labs Lab 05/04/15 2229 05/06/15 0620  AST 175* 44*  ALT 202* 114*  ALKPHOS 107 100  BILITOT 0.6 0.4  PROT 8.0 7.3  ALBUMIN 4.6 3.7   No results for input(s): LIPASE, AMYLASE in the last 168 hours. No results for input(s): AMMONIA in the last 168 hours. CBC:  Recent Labs Lab 05/04/15 2229  WBC 6.3  HGB 13.1  HCT 40.8  MCV 94.7  PLT 249   Cardiac Enzymes: No results for input(s): CKTOTAL, CKMB, CKMBINDEX, TROPONINI in the last 168 hours. BNP: BNP (last 3 results) No results for input(s): BNP in the last 8760 hours.  ProBNP (last 3 results) No results for input(s): PROBNP in the last 8760 hours.  CBG: No results for input(s): GLUCAP in the last 168 hours.    Signed:  Marcellus Scott, MD, FACP, FHM. [facility] Pager [phone]  If 7PM-7AM, please contact night-coverage [email or web address] Password [facility] 05/06/2015, 12:05 PM

## 2015-05-06 NOTE — Progress Notes (Signed)
Brenda Mcguire to be D/C'd Home per MD order.  Discussed prescriptions and follow up appointments with the patient. Prescriptions given to patient, medication list explained in detail. Pt verbalized understanding.    Medication List    STOP taking these medications        atorvastatin 10 MG tablet  Commonly known as:  LIPITOR      TAKE these medications        ALPRAZolam 0.5 MG tablet  Commonly known as:  XANAX  Take 1 tablet (0.5 mg total) by mouth 3 (three) times daily as needed for anxiety.     aspirin 81 MG tablet  Take 81 mg by mouth every morning.     cetirizine 10 MG tablet  Commonly known as:  ZYRTEC  Take 10 mg by mouth daily as needed for allergies.     divalproex 250 MG DR tablet  Commonly known as:  DEPAKOTE  Take 2 tablets (500 mg total) by mouth 3 (three) times daily.     escitalopram 10 MG tablet  Commonly known as:  LEXAPRO  Take 1 tablet (10 mg total) by mouth daily.     gabapentin 300 MG capsule  Commonly known as:  NEURONTIN  TAKE 3 CAPSULES (900 MG TOTAL) BY MOUTH 3 (THREE) TIMES DAILY.     HYDROcodone-acetaminophen 5-325 MG per tablet  Commonly known as:  NORCO/VICODIN  Take 1 tablet by mouth 3 (three) times daily as needed for moderate pain.     hydroxychloroquine 200 MG tablet  Commonly known as:  PLAQUENIL  Take 1 tablet (200 mg total) by mouth 2 (two) times daily.     levETIRAcetam 500 MG tablet  Commonly known as:  KEPPRA  Take 2 tablets (1,000 mg total) by mouth 2 (two) times daily.     predniSONE 10 MG tablet  Commonly known as:  DELTASONE  Take 1 tablet (10 mg total) by mouth daily with breakfast.     promethazine 25 MG tablet  Commonly known as:  PHENERGAN  Take 25 mg by mouth every 6 (six) hours as needed for nausea or vomiting.     topiramate 100 MG tablet  Commonly known as:  TOPAMAX  Take 1 tablet (100 mg total) by mouth daily.     zolpidem 10 MG tablet  Commonly known as:  AMBIEN  Take 1 tablet (10 mg total) by mouth at  bedtime as needed for sleep.        Filed Vitals:   05/06/15 1408  BP: 125/76  Pulse: 85  Temp: 98.5 F (36.9 C)  Resp: 16    Skin clean, dry and intact without evidence of skin break down, no evidence of skin tears noted. IV catheter discontinued intact. Site without signs and symptoms of complications. Dressing and pressure applied. Pt denies pain at this time. No complaints noted.  An After Visit Summary was printed and given to the patient. Patient escorted via WC, and D/C home via private auto.  Viviana SimplerDumas, Brenda Mcguire 05/06/2015 2:42 PM

## 2015-05-06 NOTE — Progress Notes (Signed)
Received call from Poison Control regarding this patient. AST/ALT and INR lab values drawn today are much lower than previous values. Patient is alert and oriented, VS are stable. Per Poison Control recommendation, discontinue oral acetylcysteine (Mucomyst).

## 2015-05-06 NOTE — Discharge Instructions (Signed)
Accidental Overdose  A drug overdose occurs when a chemical substance (drug or medication) is used in amounts large enough to overcome a person. This may result in severe illness or death. This is a type of poisoning. Accidental overdoses of medications or other substances come from a variety of reasons. When this happens accidentally, it is often because the person taking the substance does not know enough about what they have taken. Drugs which commonly cause overdose deaths are alcohol, psychotropic medications (medications which affect the mind), pain medications, illegal drugs (street drugs) such as cocaine and heroin, and multiple drugs taken at the same time. It may result from careless behavior (such as over-indulging at a party). Other causes of overdose may include multiple drug use, a lapse in memory, or drug use after a period of no drug use.   Sometimes overdosing occurs because a person cannot remember if they have taken their medication.   A common unintentional overdose in young children involves multi-vitamins containing iron. Iron is a part of the hemoglobin molecule in blood. It is used to transport oxygen to living cells. When taken in small amounts, iron allows the body to restock hemoglobin. In large amounts, it causes problems in the body. If this overdose is not treated, it can lead to death.  Never take medicines that show signs of tampering or do not seem quite right. Never take medicines in the dark or in poor lighting. Read the label and check each dose of medicine before you take it. When adults are poisoned, it happens most often through carelessness or lack of information. Taking medicines in the dark or taking medicine prescribed for someone else to treat the same type of problem is a dangerous practice.  SYMPTOMS   Symptoms of overdose depend on the medication and amount taken. They can vary from over-activity with stimulant over-dosage, to sleepiness from depressants such as  alcohol, narcotics and tranquilizers. Confusion, dizziness, nausea and vomiting may be present. If problems are severe enough coma and death may result.  DIAGNOSIS   Diagnosis and management are generally straightforward if the drug is known. Otherwise it is more difficult. At times, certain symptoms and signs exhibited by the patient, or blood tests, can reveal the drug in question.   TREATMENT   In an emergency department, most patients can be treated with supportive measures. Antidotes may be available if there has been an overdose of opioids or benzodiazepines. A rapid improvement will often occur if this is the cause of overdose.  At home or away from medical care:   There may be no immediate problems or warning signs in children.   Not everything works well in all cases of poisoning.   Take immediate action. Poisons may act quickly.   If you think someone has swallowed medicine or a household product, and the person is unconscious, having seizures (convulsions), or is not breathing, immediately call for an ambulance.  IF a person is conscious and appears to be doing OK but has swallowed a poison:   Do not wait to see what effect the poison will have. Immediately call a poison control center (listed in the white pages of your telephone book under "Poison Control" or inside the front cover with other emergency numbers). Some poison control centers have TTY capability for the deaf. Check with your local center if you or someone in your family requires this service.   Keep the container so you can read the label on the product for ingredients.     Describe what, when, and how much was taken and the age and condition of the person poisoned. Inform them if the person is vomiting, choking, drowsy, shows a change in color or temperature of skin, is conscious or unconscious, or is convulsing.   Do not cause vomiting unless instructed by medical personnel. Do not induce vomiting or force liquids into a person who  is convulsing, unconscious, or very drowsy.  Stay calm and in control.    Activated charcoal also is sometimes used in certain types of poisoning and you may wish to add a supply to your emergency medicines. It is available without a prescription. Call a poison control center before using this medication.  PREVENTION   Thousands of children die every year from unintentional poisoning. This may be from household chemicals, poisoning from carbon monoxide in a car, taking their parent's medications, or simply taking a few iron pills or vitamins with iron. Poisoning comes from unexpected sources.   Store medicines out of the sight and reach of children, preferably in a locked cabinet. Do not keep medications in a food cabinet. Always store your medicines in a secure place. Get rid of expired medications.   If you have children living with you or have them as occasional guests, you should have child-resistant caps on your medicine containers. Keep everything out of reach. Child proof your home.   If you are called to the telephone or to answer the door while you are taking a medicine, take the container with you or put the medicine out of the reach of small children.   Do not take your medication in front of children. Do not tell your child how good a medication is and how good it is for them. They may get the idea it is more of a treat.   If you are an adult and have accidentally taken an overdose, you need to consider how this happened and what can be done to prevent it from happening again. If this was from a street drug or alcohol, determine if there is a problem that needs addressing. If you are not sure a problems exists, it is easy to talk to a professional and ask them if they think you have a problem. It is better to handle this problem in this way before it happens again and has a much worse consequence.  Document Released: 02/13/2005 Document Revised: 02/22/2012 Document Reviewed: 07/22/2009  [employer]  Patient Information 2015 [employer]. This information is not intended to replace advice given to you by your health care provider. Make sure you discuss any questions you have with your health care provider.

## 2015-05-06 NOTE — Telephone Encounter (Signed)
Called patient s/p OD, she is in the hospital

## 2015-05-06 NOTE — Consult Note (Signed)
Psychiatry Consult follow-up  Reason for Consult:  Depression, and overdose Referring Physician:  Dr. Janeann Merl Patient Identification: Brenda Mcguire MRN:  [id number] Principal Diagnosis: MDD (major depressive episode), single episode, severe, no psychosis Diagnosis:   Patient Active Problem List   Diagnosis Date Noted  . Intentional acetaminophen overdose [T39.1X2A] 05/05/2015  . Acetaminophen overdose of undetermined intent [T39.1X4A] 05/05/2015  . Overdose [T50.901A] 05/05/2015  . 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 Acute respiratory acidosis [E87.2]   . Acute respiratory failure with hypoxia [J96.01] 10/28/2014  . NSTEMI (non-ST elevated myocardial infarction) [I21.4] 10/28/2014  . Seizure [R56.9] 10/27/2014  . Vision loss of right eye [H54.61] 10/23/2014  . Blurred vision [H53.8] 10/23/2014  . Acute loss of vision [H53.139] 10/23/2014  . Unequal pupils [H57.02]   . Status epilepticus [G40.301] 08/31/2014  . Acute respiratory failure [J96.00] 08/31/2014  . GERD (gastroesophageal reflux disease) [K21.9] 08/25/2013  . Insomnia [G47.00] 03/02/2013  . Seizures [R56.9] 03/02/2013  . 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 female patient admitted with drug overdose.Marland Kitchen  HPI:  This patient is a 54 year old widowed white female who lives alone in [place]. 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 She was admitted after taking an overdose of Norco, xanax and [place]. The patient states her husband died suddenly  04-07-23 of a respiratory infection while out of town. Since then she has been very sad, unable to sleep for several nights, crying. She claims she took extra meds in order to sleep but has no intention of killing herself. She has good social support from friends and her 2 daughters. She has a history of anxiety and has been on xanax for years but has no previous treatment for depression. She  was just referred to a bereavement counselor. She has no history of psychosis or substance abuse.  Interval history: Patient seen today with psychiatric social service for psychiatric consultation and follow-up from this weekend. Patient has been compliant with her medication Lexapro and has no reported side effects. Patient reported she has been suffering with grief secondary to husband died suddenly in 2023/04/07. Patient endorses symptoms of sadness, tearfulness and difficulty falling asleep. Patient stated she was able to maintain these her emotions while working or keeping herself busy. Patient stated her food times are more difficult to manage. Patient has been talking with her primary care physician who has providing referral to bereavement counseling. Patient endorses taking overdose on her medication with intention to go to sleep and denies intention to end her life. Patient has no history of mental illness or history of substance abuse treatment. Patient contract for safety and willing to participate in bereavement counseling as recommended. Patient is also concerned about losing her job if she cannot start working as soon as possible. Patient has been in contact with her daughter who is willing to be with her support her during this crisis situation.  Past Medical History:  Past Medical History  Diagnosis Date  . GERD (gastroesophageal reflux disease)   . Hyperlipidemia   . History of blood transfusion   . Chronic headaches   . Phlebitis   . History of colonic polyps   . 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 Tonic clonic seizures     Pseudo-Seizure  . Anxiety     "situational stress"  . Kidney stones [id number]  . Myocardial infarction  Past Surgical History  Procedure Laterality Date  . Cholecystectomy    . Tonsillectomy  1970  . Appendectomy  1975  . Lumbar fusion  1999    L5-S1  . Abdominal hysterectomy  1988  . Portacath placement Left 2008  . Stone extraction with  basket    . Back surgery      L5-S1 ALIF  . Adenoidectomy  at 54 years old  . Oophorectomy  1987  . Radiology with anesthesia N/A 10/24/2014    Procedure: RADIOLOGY WITH ANESTHESIA;  Surgeon: Medication Radiologist, MD;  Location: [facility];  Service: Radiology;  Laterality: N/A;   Family History:  Family History  Problem Relation Age of Onset  . Diabetes Mother   . Hyperlipidemia Mother   . Stroke Mother   . Hypertension Mother   . Colon cancer Mother   . Breast cancer Mother   . Early death Father   . Heart attack Father   . Heart disease Father   . Hyperlipidemia Father   . COPD Sister   . Heart disease Maternal Grandmother   . Heart disease Maternal Grandfather   . Heart disease Paternal Grandmother   . Heart disease Paternal Grandfather    Social History:  History  Alcohol Use No     History  Drug Use No    History   Social History  . Marital Status: Married    Spouse Name: N/A  . Number of Children: 2  . Years of Education: 14   Occupational History  . Nurse     [employer]  .     Social History Main Topics  . Smoking status: Never Smoker   . Smokeless tobacco: Never Used  . Alcohol Use: No  . Drug Use: No  . Sexual Activity: Yes    Birth Control/ Protection: Surgical   Other Topics Concern  . None   Social History Narrative   Lives with husband.  They have 2 grown children.   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 Hepatitis B Vaccine Hives and Shortness Of Breath    Causes wheezing   . Imitrex [Sumatriptan] Other (See Comments)    Makes her have SVT and chest pain  . Iohexol Anaphylaxis, Hives and Shortness Of Breath  . Codeine Itching and  Rash    Labs:  Results for orders placed or performed during the hospital encounter of 05/04/15 (from the past 48 hour(s))  CBC     Status: None   Collection Time: 05/04/15 10:29 PM  Result Value Ref Range   WBC 6.3 4.0 - 10.5 K/uL   RBC 4.31 3.87 - 5.11 MIL/uL   Hemoglobin 13.1 12.0 - 15.0 g/dL   HCT 40.8 36.0 - 46.0 %   MCV 94.7 78.0 - 100.0 fL   MCH 30.4 26.0 - 34.0 pg   MCHC 32.1 30.0 - 36.0 g/dL   RDW 12.8 11.5 - 15.5 %   Platelets 249 150 - 400 K/uL  Comprehensive metabolic panel     Status: Abnormal   Collection Time: 05/04/15 10:29 PM  Result Value Ref Range   Sodium 142 135 - 145 mmol/L   Potassium 4.2 3.5 - 5.1 mmol/L   Chloride 108 101 - 111 mmol/L   CO2 23 22 - 32 mmol/L   Glucose, Bld 90 65 - 99 mg/dL   BUN 22 (H) 6 - 20 mg/dL   Creatinine, Ser 0.85 0.44 - 1.00 mg/dL   Calcium 9.4 8.9 - 10.3 mg/dL   Total Protein 8.0 6.5 - 8.1 g/dL   Albumin 4.6 3.5 - 5.0 g/dL   AST 175 (H) 15 - 41 U/L   ALT 202 (H) 14 - 54 U/L   Alkaline Phosphatase 107 38 - 126 U/L   Total Bilirubin 0.6 0.3 - 1.2 mg/dL   GFR calc non Af Amer >60 >60 mL/min   GFR calc Af Amer >60 >60 mL/min    Comment: (NOTE) The eGFR has been calculated using the CKD EPI equation. This calculation has not been validated in all clinical situations. eGFR's persistently <60 mL/min signify possible Chronic Kidney Disease.    Anion gap 11 5 - 15  Ethanol (ETOH)     Status: None   Collection Time: 05/04/15 10:29 PM  Result Value Ref Range   Alcohol, Ethyl (B) <5 <5 mg/dL    Comment:        LOWEST DETECTABLE LIMIT FOR SERUM ALCOHOL IS 11 mg/dL FOR MEDICAL PURPOSES ONLY   Acetaminophen level     Status: Abnormal   Collection Time: 05/04/15 10:29 PM  Result Value Ref Range   Acetaminophen (Tylenol), Serum <10 (L) 10 - 30 ug/mL    Comment:        THERAPEUTIC CONCENTRATIONS VARY SIGNIFICANTLY. A RANGE OF 10-30 ug/mL MAY BE AN EFFECTIVE CONCENTRATION FOR MANY PATIENTS. HOWEVER, SOME ARE BEST TREATED AT  CONCENTRATIONS OUTSIDE THIS RANGE. ACETAMINOPHEN CONCENTRATIONS >150 ug/mL AT 4 HOURS AFTER INGESTION AND >50 ug/mL AT 12 HOURS AFTER INGESTION ARE OFTEN ASSOCIATED WITH TOXIC REACTIONS.   Salicylate level     Status: None   Collection Time: 05/04/15 10:29 PM  Result Value Ref Range   Salicylate Lvl <6.9 2.8 - 30.0 mg/dL  Urine rapid drug screen (hosp performed)     Status: Abnormal   Collection Time: 05/04/15 10:40 PM  Result Value Ref Range   Opiates POSITIVE (A) NONE DETECTED   Cocaine NONE DETECTED NONE DETECTED   Benzodiazepines POSITIVE (A) NONE DETECTED   Amphetamines NONE DETECTED NONE DETECTED   Tetrahydrocannabinol NONE DETECTED NONE DETECTED   Barbiturates NONE DETECTED NONE DETECTED    Comment:        DRUG SCREEN FOR MEDICAL PURPOSES ONLY.  IF CONFIRMATION IS NEEDED FOR ANY PURPOSE, NOTIFY LAB WITHIN 5 DAYS.        LOWEST DETECTABLE LIMITS FOR URINE DRUG SCREEN Drug Class       Cutoff (ng/mL) Amphetamine      1000 Barbiturate      200 Benzodiazepine   629 Tricyclics       528 Opiates          300 Cocaine          300 THC              50   Urinalysis, Routine w reflex microscopic     Status: Abnormal   Collection Time: 05/04/15 10:40 PM  Result Value Ref Range   Color, Urine [name] (A) YELLOW    Comment: BIOCHEMICALS MAY BE AFFECTED BY COLOR   APPearance CLEAR CLEAR   Specific Gravity,  Urine 1.038 (H) 1.005 - 1.030   pH 5.5 5.0 - 8.0   Glucose, UA NEGATIVE NEGATIVE mg/dL   Hgb urine dipstick NEGATIVE NEGATIVE   Bilirubin Urine SMALL (A) NEGATIVE   Ketones, ur NEGATIVE NEGATIVE mg/dL   Protein, ur NEGATIVE NEGATIVE mg/dL   Urobilinogen, UA 1.0 0.0 - 1.0 mg/dL   Nitrite NEGATIVE NEGATIVE   Leukocytes, UA SMALL (A) NEGATIVE  Pregnancy, urine     Status: None   Collection Time: 05/04/15 10:40 PM  Result Value Ref Range   Preg Test, Ur NEGATIVE NEGATIVE    Comment:        THE SENSITIVITY OF THIS METHODOLOGY IS >20 mIU/mL.   Urine microscopic-add on      Status: Abnormal   Collection Time: 05/04/15 10:40 PM  Result Value Ref Range   Squamous Epithelial / LPF RARE RARE   WBC, UA 7-10 <3 WBC/hpf   RBC / HPF 3-6 <3 RBC/hpf   Bacteria, UA FEW (A) RARE  Protime-INR     Status: None   Collection Time: 05/05/15  2:45 AM  Result Value Ref Range   Prothrombin Time 13.6 11.6 - 15.2 seconds   INR 1.02 0.00 - 1.49  Valproic acid level     Status: Abnormal   Collection Time: 05/05/15  2:45 AM  Result Value Ref Range   Valproic Acid Lvl <10 (L) 50.0 - 100.0 ug/mL    Comment: RESULTS CONFIRMED BY MANUAL DILUTION Performed at [facility]   Acetaminophen level (recheck)     Status: Abnormal   Collection Time: 05/05/15  2:45 AM  Result Value Ref Range   Acetaminophen (Tylenol), Serum <10 (L) 10 - 30 ug/mL    Comment:        THERAPEUTIC CONCENTRATIONS VARY SIGNIFICANTLY. A RANGE OF 10-30 ug/mL MAY BE AN EFFECTIVE CONCENTRATION FOR MANY PATIENTS. HOWEVER, SOME ARE BEST TREATED AT CONCENTRATIONS OUTSIDE THIS RANGE. ACETAMINOPHEN CONCENTRATIONS >150 ug/mL AT 4 HOURS AFTER INGESTION AND >50 ug/mL AT 12 HOURS AFTER INGESTION ARE OFTEN ASSOCIATED WITH TOXIC REACTIONS.   Acetaminophen level     Status: Abnormal   Collection Time: 05/05/15  7:04 AM  Result Value Ref Range   Acetaminophen (Tylenol), Serum <10 (L) 10 - 30 ug/mL    Comment:        THERAPEUTIC CONCENTRATIONS VARY SIGNIFICANTLY. A RANGE OF 10-30 ug/mL MAY BE AN EFFECTIVE CONCENTRATION FOR MANY PATIENTS. HOWEVER, SOME ARE BEST TREATED AT CONCENTRATIONS OUTSIDE THIS RANGE. ACETAMINOPHEN CONCENTRATIONS >150 ug/mL AT 4 HOURS AFTER INGESTION AND >50 ug/mL AT 12 HOURS AFTER INGESTION ARE OFTEN ASSOCIATED WITH TOXIC REACTIONS.   Comprehensive metabolic panel     Status: Abnormal   Collection Time: 05/06/15  6:20 AM  Result Value Ref Range   Sodium 140 135 - 145 mmol/L   Potassium 3.6 3.5 - 5.1 mmol/L   Chloride 108 101 - 111 mmol/L   CO2 19 (L) 22 - 32 mmol/L    Glucose, Bld 107 (H) 65 - 99 mg/dL   BUN 18 6 - 20 mg/dL   Creatinine, Ser 0.71 0.44 - 1.00 mg/dL   Calcium 9.0 8.9 - 10.3 mg/dL   Total Protein 7.3 6.5 - 8.1 g/dL   Albumin 3.7 3.5 - 5.0 g/dL   AST 44 (H) 15 - 41 U/L   ALT 114 (H) 14 - 54 U/L   Alkaline Phosphatase 100 38 - 126 U/L   Total Bilirubin 0.4 0.3 - 1.2 mg/dL   GFR calc non Af Amer >60 >  60 mL/min   GFR calc Af Amer >60 >60 mL/min    Comment: (NOTE) The eGFR has been calculated using the CKD EPI equation. This calculation has not been validated in all clinical situations. eGFR's persistently <60 mL/min signify possible Chronic Kidney Disease.    Anion gap 13 5 - 15  Protime-INR     Status: None   Collection Time: 05/06/15  6:20 AM  Result Value Ref Range   Prothrombin Time 14.8 11.6 - 15.2 seconds   INR 1.14 0.00 - 1.49    Vitals: Blood pressure 130/91, pulse 85, temperature 98.1 F (36.7 C), temperature source Oral, resp. rate 18, height 5' 8"  (1.727 m), weight 87.5 kg (192 lb 14.4 oz), SpO2 100 %.  Risk to Self: Suicidal Ideation: No Suicidal Intent: No Is patient at risk for suicide?: No Suicidal Plan?: No Access to Means: No What has been your use of drugs/alcohol within the last 12 months?: No drugs or alcohol use reported.  How many times?: 0 Other Self Harm Risks: No other self harm risk reported at this time.  Triggers for Past Attempts: None known Intentional Self Injurious Behavior: None Risk to Others: Homicidal Ideation: No Thoughts of Harm to Others: No Current Homicidal Intent: No Current Homicidal Plan: No Access to Homicidal Means: No Identified Victim: NA History of harm to others?: No Assessment of Violence: On admission Violent Behavior Description: No violent behaviors observed. Pt is cooperative at this time.  Does patient have access to weapons?: No Criminal Charges Pending?: No Does patient have a court date: No Prior Inpatient Therapy: Prior Inpatient Therapy: No Prior Outpatient  Therapy: Prior Outpatient Therapy: No Does patient have an ACCT team?: No Does patient have Intensive In-House Services?  : No Does patient have [facility] services? : No Does patient have P4CC services?: No  Current Facility-Administered Medications  Medication Dose Route Frequency Provider Last Rate Last Dose  . acetylcysteine (MUCOMYST) 20 % nebulizer / oral solution 6,060 mg  70 mg/kg Oral Q4H Leann T Poindexter, RPH   6,060 mg at 05/06/15 0749  . aspirin chewable tablet 81 mg  81 mg Oral q morning - 10a Modena Jansky, MD   81 mg at 05/05/15 1215  . divalproex (DEPAKOTE) DR tablet 500 mg  500 mg Oral TID Toy Baker, MD   500 mg at 05/05/15 2300  . escitalopram (LEXAPRO) tablet 10 mg  10 mg Oral Daily Cloria Spring, MD   10 mg at 05/05/15 1215  . hydroxychloroquine (PLAQUENIL) tablet 200 mg  200 mg Oral BID Toy Baker, MD   200 mg at 05/05/15 2256  . ibuprofen (ADVIL,MOTRIN) tablet 600 mg  600 mg Oral Q6H PRN Modena Jansky, MD   600 mg at 05/05/15 1215  . levETIRAcetam (KEPPRA) tablet 1,000 mg  1,000 mg Oral BID Toy Baker, MD   1,000 mg at 05/05/15 2256  . ondansetron (ZOFRAN) tablet 4 mg  4 mg Oral Q8H PRN Modena Jansky, MD   4 mg at 05/06/15 0352  . predniSONE (DELTASONE) tablet 10 mg  10 mg Oral Q breakfast Toy Baker, MD   10 mg at 05/06/15 0826  . sodium chloride 0.9 % injection 10-40 mL  10-40 mL Intracatheter PRN Modena Jansky, MD   10 mL at 05/06/15 5625  . topiramate (TOPAMAX) tablet 100 mg  100 mg Oral Daily Modena Jansky, MD   100 mg at 05/05/15 1215    Musculoskeletal: Strength & Muscle Tone: within normal  limits Gait & Station: normal Patient leans: N/A  Psychiatric Specialty Exam: Physical Exam  Review of Systems  Psychiatric/Behavioral: Positive for depression and suicidal ideas.  All other systems reviewed and are negative.   Blood pressure 130/91, pulse 85, temperature 98.1 F (36.7 C), temperature source Oral, resp.  rate 18, height 5' 8"  (1.727 m), weight 87.5 kg (192 lb 14.4 oz), SpO2 100 %.Body mass index is 29.34 kg/(m^2).  General Appearance: Fairly Groomed  Engineer, water::  Good  Speech:  Clear and Coherent  Volume:  Decreased  Mood:  Depressed, Dysphoric and Hopeless  Affect:  Constricted and Depressed  Thought Process:  Circumstantial and Coherent  Orientation:  Full (Time, Place, and Person)  Thought Content:  Rumination  Suicidal Thoughts:  Yes.  without intent/plan  Homicidal Thoughts:  No  Memory:  Immediate;   Good Recent;   Good Remote;   Good  Judgement:  Poor  Insight:  Fair  Psychomotor Activity:  Decreased  Concentration:  Fair  Recall:  Good  Fund of Knowledge:Good  Language: Good  Akathisia:  No  Handed:  right  AIMS (if indicated):     Assets:  Communication Skills Desire for Improvement Resilience Social Support  ADL's:  Intact  Cognition: WNL  Sleep:   poor   Medical Decision Making: New problem, with additional work up planned, Review of Psycho-Social Stressors (1), Review and summation of old records (2) and Review of New Medication or Change in Dosage (2)  Treatment Plan Summary: Patient has been suffering with significant symptoms of grief and few symptoms of depression but denied suicidal ideation, intention or plan. She has no homicidal ideations or plan. Patient has no psychotic symptoms. Patient contract for safety and willing to participate in outpatient bereavement counseling. Daily contact with patient to assess and evaluate symptoms and progress in treatment and Medication management  Plan: We'll continue Lexapro 10 mg daily for depression Patient does not meet criteria for psychiatric inpatient admission. Supportive therapy provided about ongoing stressors.  Appreciate psychiatric consultation and we sign off at this time Please contact [phone] or [phone] if needs further assistance   Disposition: Patient will be referred to the outpatient  psychiatric services and bereavement counseling when medically stable.   Carla Rashad,JANARDHAHA R. 05/06/2015 10:03 AM

## 2015-05-07 ENCOUNTER — Telehealth: Payer: Self-pay | Admitting: Family Medicine

## 2015-05-07 NOTE — Telephone Encounter (Signed)
Called patient. She is tearful but not admitting to any suicidal or homicidal thoughts at this time. Her children are calling her every 5 minutes. She has not yet called a bereavement counselor. I gave her 4-5 different names that we had available in their phone numbers. She needs to call them herself. She prefers female therapist. She will come in for recheck of her labs tomorrow.

## 2015-05-09 ENCOUNTER — Telehealth: Payer: Self-pay

## 2015-05-09 NOTE — Telephone Encounter (Signed)
Letter faxed. Pt notified.

## 2015-05-09 NOTE — Telephone Encounter (Signed)
Dr. Conley RollsLe  Patient needs a work note to return to work .   Spoke to you yesterday.   [phone]

## 2015-05-12 ENCOUNTER — Encounter: Payer: Self-pay | Admitting: Family Medicine

## 2015-05-20 ENCOUNTER — Telehealth: Payer: Self-pay | Admitting: Family Medicine

## 2015-05-20 NOTE — Telephone Encounter (Signed)
Spoke with Burna MortimerWanda doing better, she is at work, [facility], she is on Lexapro.

## 2015-05-26 ENCOUNTER — Other Ambulatory Visit: Payer: Self-pay | Admitting: Internal Medicine

## 2015-06-14 ENCOUNTER — Encounter: Payer: Self-pay | Admitting: Family Medicine

## 2015-06-30 ENCOUNTER — Other Ambulatory Visit: Payer: Self-pay | Admitting: Physician Assistant

## 2015-06-30 ENCOUNTER — Encounter: Payer: Self-pay | Admitting: Family Medicine

## 2015-07-02 ENCOUNTER — Telehealth: Payer: Self-pay | Admitting: Family Medicine

## 2015-07-02 ENCOUNTER — Encounter: Payer: Self-pay | Admitting: Family Medicine

## 2015-07-02 DIAGNOSIS — K219 Gastro-esophageal reflux disease without esophagitis: Secondary | ICD-10-CM

## 2015-07-02 DIAGNOSIS — G43809 Other migraine, not intractable, without status migrainosus: Secondary | ICD-10-CM

## 2015-07-02 DIAGNOSIS — Z1239 Encounter for other screening for malignant neoplasm of breast: Secondary | ICD-10-CM

## 2015-07-02 DIAGNOSIS — G4700 Insomnia, unspecified: Secondary | ICD-10-CM

## 2015-07-02 DIAGNOSIS — R569 Unspecified convulsions: Secondary | ICD-10-CM

## 2015-07-02 DIAGNOSIS — H469 Unspecified optic neuritis: Secondary | ICD-10-CM

## 2015-07-02 DIAGNOSIS — Z634 Disappearance and death of family member: Secondary | ICD-10-CM

## 2015-07-02 DIAGNOSIS — M352 Behcet's disease: Secondary | ICD-10-CM

## 2015-07-02 DIAGNOSIS — F419 Anxiety disorder, unspecified: Secondary | ICD-10-CM

## 2015-07-02 DIAGNOSIS — Z1211 Encounter for screening for malignant neoplasm of colon: Secondary | ICD-10-CM

## 2015-07-02 MED ORDER — ZOLPIDEM TARTRATE 10 MG PO TABS: 10.0000 mg | ORAL_TABLET | Freq: Every evening | ORAL | Status: DC | PRN

## 2015-07-02 MED ORDER — HYDROCODONE-ACETAMINOPHEN 5-325 MG PO TABS: 1.0000 | ORAL_TABLET | Freq: Three times a day (TID) | ORAL | Status: DC | PRN

## 2015-07-02 NOTE — Telephone Encounter (Signed)
Pt was seen today and given a Rx.

## 2015-07-02 NOTE — Telephone Encounter (Signed)
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 Advise to be seen if new onset symptoms. Refills for pickup.

## 2015-07-02 NOTE — Addendum Note (Signed)
Addended by: Morrell RiddleWEBER, SARAH L on: 07/02/2015 09:16 PM   Modules accepted: Orders

## 2015-07-03 NOTE — Telephone Encounter (Signed)
Rx in drawer. Pt notified by MyChart. Also advised pt to RTC or go to ER with any new onset of symptoms. Dr Conley RollsLe, forwarding this to you.

## 2015-07-06 ENCOUNTER — Encounter: Payer: Self-pay | Admitting: Family Medicine

## 2015-07-08 ENCOUNTER — Encounter: Payer: Self-pay | Admitting: Family Medicine

## 2015-07-09 ENCOUNTER — Other Ambulatory Visit: Payer: Self-pay | Admitting: Family Medicine

## 2015-07-09 MED ORDER — PROMETHAZINE HCL 25 MG PO TABS: 25.0000 mg | ORAL_TABLET | Freq: Four times a day (QID) | ORAL | Status: DC | PRN

## 2015-07-11 ENCOUNTER — Telehealth: Payer: Self-pay | Admitting: Family Medicine

## 2015-07-11 ENCOUNTER — Encounter: Payer: Self-pay | Admitting: Family Medicine

## 2015-07-11 NOTE — Telephone Encounter (Signed)
LM that she needs to be evaluated since new sxs , worsening sxs. Advise to go to Er or come into office.

## 2015-07-22 ENCOUNTER — Encounter: Payer: Self-pay | Admitting: Family Medicine

## 2015-08-15 ENCOUNTER — Other Ambulatory Visit: Payer: Self-pay | Admitting: Family Medicine

## 2015-08-15 ENCOUNTER — Other Ambulatory Visit: Payer: Self-pay | Admitting: Neurology

## 2015-08-17 ENCOUNTER — Other Ambulatory Visit: Payer: Self-pay | Admitting: Family Medicine

## 2015-08-19 ENCOUNTER — Other Ambulatory Visit: Payer: Self-pay | Admitting: Family Medicine

## 2015-08-27 ENCOUNTER — Telehealth: Payer: Self-pay | Admitting: Family Medicine

## 2015-08-27 NOTE — Telephone Encounter (Signed)
Dr. Conley Rolls refilled Alprazolam and rx faxed to [facility]. Received fax confirmation.

## 2015-09-03 ENCOUNTER — Emergency Department: Payer: Federal, State, Local not specified - PPO

## 2015-09-03 ENCOUNTER — Emergency Department
Admission: EM | Admit: 2015-09-03 | Discharge: 2015-09-03 | Disposition: A | Discharge status: Home / Self Care | Payer: Federal, State, Local not specified - PPO | Attending: Emergency Medicine | Admitting: Emergency Medicine

## 2015-09-03 ENCOUNTER — Encounter: Payer: Self-pay | Admitting: Emergency Medicine

## 2015-09-03 DIAGNOSIS — Z79899 Other long term (current) drug therapy: Secondary | ICD-10-CM | POA: Diagnosis not present

## 2015-09-03 DIAGNOSIS — F419 Anxiety disorder, unspecified: Secondary | ICD-10-CM | POA: Insufficient documentation

## 2015-09-03 DIAGNOSIS — Z7982 Long term (current) use of aspirin: Secondary | ICD-10-CM | POA: Insufficient documentation

## 2015-09-03 DIAGNOSIS — G43809 Other migraine, not intractable, without status migrainosus: Secondary | ICD-10-CM | POA: Diagnosis not present

## 2015-09-03 DIAGNOSIS — G40909 Epilepsy, unspecified, not intractable, without status epilepticus: Secondary | ICD-10-CM | POA: Insufficient documentation

## 2015-09-03 DIAGNOSIS — R569 Unspecified convulsions: Secondary | ICD-10-CM | POA: Diagnosis present

## 2015-09-03 MED ORDER — HYDROMORPHONE HCL 1 MG/ML IJ SOLN
0.5000 mg | Freq: Once | INTRAMUSCULAR | Status: AC
  Administered 2015-09-03: 0.5 mg via INTRAVENOUS
  Filled 2015-09-03: qty 1

## 2015-09-03 MED ORDER — HEPARIN SOD (PORK) LOCK FLUSH 100 UNIT/ML IV SOLN
INTRAVENOUS | Status: AC
  Administered 2015-09-03: 500 [IU]
  Filled 2015-09-03: qty 5

## 2015-09-03 MED ORDER — SODIUM CHLORIDE 0.9 % IV BOLUS (SEPSIS)
1000.0000 mL | Freq: Once | INTRAVENOUS | Status: AC
  Administered 2015-09-03: 1000 mL via INTRAVENOUS

## 2015-09-03 MED ORDER — DIPHENHYDRAMINE HCL 50 MG/ML IJ SOLN: 25.0000 mg | Freq: Once | INTRAMUSCULAR | Status: DC

## 2015-09-03 MED ORDER — METOCLOPRAMIDE HCL 5 MG/ML IJ SOLN
10.0000 mg | Freq: Once | INTRAMUSCULAR | Status: AC
  Administered 2015-09-03: 10 mg via INTRAVENOUS
  Filled 2015-09-03: qty 2

## 2015-09-03 MED ORDER — ONDANSETRON HCL 4 MG/2ML IJ SOLN
4.0000 mg | Freq: Once | INTRAMUSCULAR | Status: AC
  Administered 2015-09-03: 4 mg via INTRAVENOUS
  Filled 2015-09-03: qty 2

## 2015-09-03 MED ORDER — LORAZEPAM 2 MG/ML IJ SOLN
1.0000 mg | Freq: Once | INTRAMUSCULAR | Status: AC
  Administered 2015-09-03: 1 mg via INTRAVENOUS
  Filled 2015-09-03: qty 1

## 2015-09-03 NOTE — ED Notes (Signed)
Pt brought from work via EMS, pt experienced an aura and felt she was going to have a seizure; denies having seizure. 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 Pt also has a hx of NSTEMI. C/o headache x2 days. Pt takes depakote, keppra, topamax, denies missing dose.

## 2015-09-03 NOTE — ED Provider Notes (Addendum)
[facility] Emergency Department Brenda Mcguire Note  ____________________________________________  Time seen: Approximately 12:33 PM  I have reviewed the triage vital signs and the nursing notes.   HISTORY  Chief Complaint Seizures    HPI Brenda Mcguire is a 54 y.o. female with a compensated history of tonic-clonic seizures, pseudoseizures, anxiety, heart disease, Behcet disease,chronic recurrent headaches sometimes associated with emesis Brenda Mcguire, hyperlipidemia, back surgery, chronic pain issues who presents today complaining of a headache. She states that normally she never headaches with Tylenol and Motrin. However, this headache has been gradual in onset since yesterday and persistent. She states she did vomit this morning. No fever no chills. Patient is compliant with her Keppra and has not had a seizure. The patient states that her right pupil was noted to be larger than the left by the people with him she worse, she is a Engineer, civil (consulting), she was sent in for her headache and emesis Brenda Mcguire. Review of her nose does show a history of illness Brenda Mcguire within a safe patient states this is similar to that. This is not the worst headache of life, gradual onset, she has had no stiff neck no fever, it was not a "thunderclap headache" she states that she "has no idea" what will help her headaches. Patient states that usually is just Tylenol and Motrin however she does also list a allergy to Imitrex. I have asked her what normally worse when she goes to the doctor she states she has no idea. She states she has not taken any of her narcotic pain medication for the last month.  Past Medical History  Diagnosis Date  . GERD (gastroesophageal reflux disease)   . Hyperlipidemia   . History of blood transfusion   . Chronic headaches   . Phlebitis   . History of colonic polyps   . 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 Tonic clonic seizures     Pseudo-Seizure  . Anxiety     "situational  stress"  . Kidney stones 1610,9604  . Myocardial infarction     Patient Active Problem List   Diagnosis Date Noted  . MDD (major depressive episode), single episode, severe, no psychosis 05/06/2015  . Intentional acetaminophen overdose 05/05/2015  . Acetaminophen overdose of undetermined intent 05/05/2015  . Overdose 05/05/2015  . Pseudoseizures   . Acute respiratory acidosis   . Acute respiratory failure with hypoxia 10/28/2014  . NSTEMI (non-ST elevated myocardial infarction) 10/28/2014  . Seizure 10/27/2014  . Vision loss of right eye 10/23/2014  . Blurred vision 10/23/2014  . Acute loss of vision 10/23/2014  . Unequal pupils   . Status epilepticus 08/31/2014  . Acute respiratory failure 08/31/2014  . GERD (gastroesophageal reflux disease) 08/25/2013  . Insomnia 03/02/2013  . Seizures 03/02/2013  . Behcet's disease 03/02/2013    Past Surgical History  Procedure Laterality Date  . Cholecystectomy    . Tonsillectomy  1970  . Appendectomy  1975  . Lumbar fusion  1999    L5-S1  . Abdominal hysterectomy  1988  . Portacath placement Left 2008  . Stone extraction with basket    . Back surgery      L5-S1 ALIF  . Adenoidectomy  at 54 years old  . Oophorectomy  1987  . Radiology with anesthesia N/A 10/24/2014    Procedure: RADIOLOGY WITH ANESTHESIA;  Surgeon: Medication Radiologist, MD;  Location: [facility] OR;  Service: Radiology;  Laterality: N/A;    Current Outpatient Rx  Name  Route  Sig  Dispense  Refill  . ALPRAZolam (XANAX) 0.5 MG tablet      TAKE 1 TABLET BY MOUTH 3 TIMES A DAY AS NEEDED FOR ANXIETY   90 tablet   1   . aspirin 81 MG tablet   Oral   Take 81 mg by mouth every morning.          . cetirizine (ZYRTEC) 10 MG tablet   Oral   Take 10 mg by mouth daily as needed for allergies.         Marland Kitchen divalproex (DEPAKOTE) 250 MG DR tablet   Oral   Take 2 tablets (500 mg total) by mouth 3 (three) times daily.   60 tablet   0   . escitalopram (LEXAPRO) 10 MG  tablet   Oral   Take 1 tablet (10 mg total) by mouth daily.   30 tablet   0   . gabapentin (NEURONTIN) 300 MG capsule      TAKE 3 CAPSULES (900 MG TOTAL) BY MOUTH 3 (THREE) TIMES DAILY.   270 capsule   2     NEEDS RETURN OV   . HYDROcodone-acetaminophen (NORCO/VICODIN) 5-325 MG per tablet   Oral   Take 1 tablet by mouth 3 (three) times daily as needed for moderate pain.   90 tablet   0   . hydroxychloroquine (PLAQUENIL) 200 MG tablet   Oral   Take 1 tablet (200 mg total) by mouth 2 (two) times daily.   60 tablet   5   . levETIRAcetam (KEPPRA) 500 MG tablet   Oral   Take 2 tablets (1,000 mg total) by mouth 2 (two) times daily.   90 tablet   0   . predniSONE (DELTASONE) 10 MG tablet   Oral   Take 1 tablet (10 mg total) by mouth daily with breakfast.   30 tablet   5   . promethazine (PHENERGAN) 25 MG tablet      TAKE 1 TABLET BY MOUTH EVERY 6 HOURS AS NEEDED FOR NAUSEA AND VOMITING   20 tablet   0   . topiramate (TOPAMAX) 100 MG tablet   Oral   Take 1 tablet (100 mg total) by mouth daily.   30 tablet   6   . zolpidem (AMBIEN) 10 MG tablet      TAKE 1 TAB BY MOUTH AT BEDTIME AS NEEDED FOR SLEEP   30 tablet   2     Refills every 30 days only     Allergies Hepatitis b vaccine; Imitrex; Iohexol; and Codeine  Family History  Problem Relation Age of Onset  . Diabetes Mother   . Hyperlipidemia Mother   . Stroke Mother   . Hypertension Mother   . Colon cancer Mother   . Breast cancer Mother   . Early death Father   . Heart attack Father   . Heart disease Father   . Hyperlipidemia Father   . COPD Sister   . Heart disease Maternal Grandmother   . Heart disease Maternal Grandfather   . Heart disease Paternal Grandmother   . Heart disease Paternal Grandfather     Social History Social History  Substance Use Topics  . Smoking status: Never Smoker   . Smokeless tobacco: Never Used  . Alcohol Use: No    Review of Systems Constitutional: No  fever/chills Eyes: No visual changes. ENT: No sore throat. Cardiovascular: Denies chest pain. Respiratory: Denies shortness of breath. Gastrointestinal: No abdominal pain.  Positive nausea, positive vomiting.  No diarrhea.  No constipation. Genitourinary: Negative for dysuria. Musculoskeletal: Negative for back pain. Skin: Negative for rash. Neurological: Negative for headaches, focal weakness or numbness.  10-point ROS otherwise negative.  ____________________________________________   PHYSICAL EXAM:  VITAL SIGNS: ED Triage Vitals  Enc Vitals Group     BP 09/03/15 1148 141/89 mmHg     Pulse Rate 09/03/15 1148 86     Resp 09/03/15 1148 18     Temp 09/03/15 1148 98.5 F (36.9 C)     Temp Source 09/03/15 1148 Oral     SpO2 09/03/15 1148 100 %     Weight --      Height --      Head Cir --      Peak Flow --      Pain Score 09/03/15 1148 8     Pain Loc --      Pain Edu? --      Excl. in GC? --     Constitutional: Alert and oriented. Well appearing and in no acute distress. She is anxious and upset but nontoxic Eyes: Conjunctivae are normal. PERRL. EOMI. Head: Atraumatic. Nose: No congestion/rhinnorhea. Mouth/Throat: Mucous membranes are moist.  Oropharynx non-erythematous. Neck: No stridor.   Cardiovascular: Normal rate, regular rhythm. Grossly normal heart sounds.  Good peripheral circulation. Respiratory: Normal respiratory effort.  No retractions. Lungs CTAB. Gastrointestinal: Soft and nontender. No distention. No abdominal bruits. No CVA tenderness.  Musculoskeletal: No lower extremity tenderness nor edema.  No joint effusions. Neurologic:  Normal speech and language. Aside from an excoriated with the right pupil being only reactive, and C through 12 are grossly intact she has 5 out of 5 strength bilateral upper and lower extremities show within normal limits. Nose within normal limits no obvious neurologic deficits aside from aniscoria Skin:  Skin is warm, dry and  intact. No rash noted. Psychiatric: Mood and affect are anxious and upset. Speech and behavior are normal.  ____________________________________________   LABS (all labs ordered are listed, but only abnormal results are displayed)  Labs Reviewed  CBC WITH DIFFERENTIAL/PLATELET  PROTIME-INR  ACETAMINOPHEN LEVEL  COMPREHENSIVE METABOLIC PANEL  ETHANOL  APTT   ____________________________________________  EKG   ____________________________________________  RADIOLOGY I have reviewed x-rays  ____________________________________________   PROCEDURES  Procedure(s) performed: None  Critical Care performed: None  ____________________________________________   INITIAL IMPRESSION / ASSESSMENT AND PLAN / ED COURSE  Pertinent labs & imaging results that were available during my care of the patient were reviewed by me and considered in my medical decision making (see chart for details).  Patient here with a headache, gradual onset started yesterday gradually which then progressed., Take is similar to prior headaches. Patient has chronic headaches. Of concern of course is the unequal pupils however, this can happen migraine and apparently has happened to the patient before. We will treat her with routine migraine medication and something for anxiety as she is quite anxious, we have her IV fluids we'll obtain a CT scan of the head. Low suspicion however that this represents a posterior communicating artery aneurysm or imminent herniation. Patient is quite well-appearing despite this. Nonetheless, we will obtain imaging and continue to watch her closely.  ----------------------------------------- 2:18 PM on 09/03/2015 -----------------------------------------  Patient remains in no acute medical distress, still tearful and upset as she has been since she got here. However, she remains neurologically intact. Patient reported no relief from any of the medications we gave her prior to  Dilaudid. Now after Dilaudid however she is  feeling much better. Vital signs are reassuring. Patient states she still has a headache but it is "improving". Patient has very poor peripheral access, we're unable to get blood on her, she does have a port and we were able to give her IV fluid and medication but not draw off the port. She would prefer not to have multiple different attempts at blood draw at this time, I will give her a liter of fluid and see if we can get blood work from her. However, have low suspicion will show anything acute. We'll continue to watch her closely here in the emergency department. Patient is reporting that she is feeling somewhat better.  ____________________________________________   ----------------------------------------- 3:12 PM on 09/03/2015 -----------------------------------------  The patient is in no acute distress at this time relaxing in bed eating a sandwich. We have had difficulty obtaining blood work from her. Patient at this time declines further attempts. She does understand without blood work I cannot evaluate for further pathology. I have offered an EJ and a groin stick and she declines. I am reassured that her headache is negative, her neurologic exam remains normal. We'll discuss with her neurologist. Again this is a second time in her life at least that she has had blurry problems on the right side and she has no complaint of change in vision. She declines and I exam she still has some residual photophobia. Given that the patient is refusing blood work and declining further evaluation and stating that she wishes to go home at this time, we will discharge her home likely at her own request with close follow-up with her neurologist if I can arrange.  ----------------------------------------- 3:19 PM on 09/03/2015 -----------------------------------------  Patient requests discharge at this time. I was able to talk to Dr. Allena Katz who is on-call for her  neurologist. They do see Dr. Willeen Cass multiple other episodes of an escorted with this patient and refill it is associated with her migraine. Patient declines LP or any further intervention here which I do not think is unreasonable. I have low suspicion for bleed or mass. She declines further intervention and we will discharge her home. I do not see any papilledema at this time. Term precautions and follow-up stressed and understood.  FINAL CLINICAL IMPRESSION(S) / ED DIAGNOSES  Final diagnoses:  None     Jeanmarie Plant, MD 09/03/15 1420  Jeanmarie Plant, MD 09/03/15 1513  Jeanmarie Plant, MD 09/03/15 1520

## 2015-09-03 NOTE — Discharge Instructions (Signed)

## 2015-09-03 NOTE — ED Notes (Signed)
Patient transported to CT 

## 2015-09-03 NOTE — ED Notes (Signed)
Pt discharged, waiting for daughter in the lobby.

## 2015-09-05 ENCOUNTER — Encounter: Payer: Self-pay | Admitting: Family Medicine

## 2015-09-05 ENCOUNTER — Other Ambulatory Visit: Payer: Self-pay | Admitting: Family Medicine

## 2015-09-05 ENCOUNTER — Encounter: Payer: Self-pay | Admitting: Neurology

## 2015-09-05 MED ORDER — HYDROCODONE-ACETAMINOPHEN 5-325 MG PO TABS: 1.0000 | ORAL_TABLET | Freq: Three times a day (TID) | ORAL | Status: DC | PRN

## 2015-09-08 ENCOUNTER — Encounter: Payer: Self-pay | Admitting: Neurology

## 2015-09-09 ENCOUNTER — Encounter: Payer: Self-pay | Admitting: Neurology

## 2015-09-09 ENCOUNTER — Encounter: Payer: Self-pay | Admitting: Family Medicine

## 2015-09-10 ENCOUNTER — Encounter: Payer: Self-pay | Admitting: Family Medicine

## 2015-09-10 ENCOUNTER — Other Ambulatory Visit: Payer: Self-pay | Admitting: Family Medicine

## 2015-09-11 ENCOUNTER — Other Ambulatory Visit: Payer: Self-pay | Admitting: Family Medicine

## 2015-09-11 ENCOUNTER — Encounter: Payer: Self-pay | Admitting: Neurology

## 2015-09-11 MED ORDER — DIVALPROEX SODIUM 250 MG PO DR TAB: DELAYED_RELEASE_TABLET | ORAL | Status: DC

## 2015-09-11 MED ORDER — LEVETIRACETAM 500 MG PO TABS: ORAL_TABLET | ORAL | Status: DC

## 2015-09-12 ENCOUNTER — Encounter: Payer: Self-pay | Admitting: Neurology

## 2015-09-12 ENCOUNTER — Encounter: Payer: Self-pay | Admitting: Family Medicine

## 2015-09-12 NOTE — Telephone Encounter (Signed)
Do you want to give RFs? 

## 2015-09-13 ENCOUNTER — Encounter: Payer: Self-pay | Admitting: Neurology

## 2015-09-14 ENCOUNTER — Encounter: Payer: Self-pay | Admitting: Family Medicine

## 2015-09-15 ENCOUNTER — Other Ambulatory Visit: Payer: Self-pay | Admitting: Family Medicine

## 2015-10-07 ENCOUNTER — Encounter: Payer: Self-pay | Admitting: Family Medicine

## 2015-10-07 ENCOUNTER — Telehealth: Payer: Self-pay

## 2015-10-07 ENCOUNTER — Other Ambulatory Visit: Payer: Self-pay | Admitting: Family Medicine

## 2015-10-07 NOTE — Telephone Encounter (Signed)
Dr. Conley RollsLe can we write note?

## 2015-10-07 NOTE — Telephone Encounter (Signed)
Pt requesting [facility] note for today due to a flare-up of her [place] disease fax to [phone]   Best phone for pt is [phone]

## 2015-10-08 NOTE — Telephone Encounter (Signed)
Yes please write note

## 2015-10-08 NOTE — Telephone Encounter (Signed)
Faxed. Pt notified 

## 2015-10-09 ENCOUNTER — Other Ambulatory Visit: Payer: Self-pay | Admitting: Family Medicine

## 2015-10-09 MED ORDER — HYDROCODONE-ACETAMINOPHEN 5-325 MG PO TABS: 1.0000 | ORAL_TABLET | Freq: Three times a day (TID) | ORAL | Status: DC | PRN

## 2015-10-09 MED ORDER — PROMETHAZINE HCL 25 MG PO TABS: ORAL_TABLET | ORAL | Status: DC

## 2015-10-10 ENCOUNTER — Other Ambulatory Visit: Payer: Self-pay | Admitting: Family Medicine

## 2015-10-10 ENCOUNTER — Telehealth: Payer: Self-pay

## 2015-10-10 ENCOUNTER — Encounter: Payer: Self-pay | Admitting: Family Medicine

## 2015-10-10 DIAGNOSIS — M352 Behcet's disease: Secondary | ICD-10-CM

## 2015-10-10 MED ORDER — PREDNISONE 20 MG PO TABS: ORAL_TABLET | ORAL | Status: DC

## 2015-10-10 NOTE — Telephone Encounter (Signed)
Gave pt note for today and tomorrow. Pt will be in to see you Sat.

## 2015-10-10 NOTE — Telephone Encounter (Signed)
Pt has been mycharting with erin and would like to have a work note faxed to her employer she has the fax number in her mychart messages and would like a response back when this has been done

## 2015-10-10 NOTE — Telephone Encounter (Signed)
Pt called checking on status of this message. Please advise at [phone] (H)

## 2015-10-11 ENCOUNTER — Other Ambulatory Visit: Payer: Self-pay | Admitting: Family Medicine

## 2015-10-11 NOTE — Telephone Encounter (Signed)
Pt stopped by. Note written

## 2015-10-12 ENCOUNTER — Emergency Department (HOSPITAL_COMMUNITY)
Admission: EM | Admit: 2015-10-12 | Discharge: 2015-10-12 | Disposition: A | Discharge status: Home / Self Care | Payer: Managed Care, Other (non HMO) | Attending: Emergency Medicine | Admitting: Emergency Medicine

## 2015-10-12 ENCOUNTER — Encounter (HOSPITAL_COMMUNITY): Payer: Self-pay | Admitting: Nurse Practitioner

## 2015-10-12 ENCOUNTER — Ambulatory Visit (INDEPENDENT_AMBULATORY_CARE_PROVIDER_SITE_OTHER): Payer: Federal, State, Local not specified - PPO | Admitting: Family Medicine

## 2015-10-12 ENCOUNTER — Encounter: Payer: Self-pay | Admitting: Family Medicine

## 2015-10-12 ENCOUNTER — Emergency Department (HOSPITAL_COMMUNITY): Payer: Managed Care, Other (non HMO)

## 2015-10-12 VITALS — BP 116/78 | HR 98 | Temp 98.4°F | Resp 16 | Ht 68.0 in | Wt 194.0 lb

## 2015-10-12 DIAGNOSIS — G43809 Other migraine, not intractable, without status migrainosus: Secondary | ICD-10-CM | POA: Diagnosis not present

## 2015-10-12 DIAGNOSIS — Z7982 Long term (current) use of aspirin: Secondary | ICD-10-CM | POA: Diagnosis not present

## 2015-10-12 DIAGNOSIS — H5702 Anisocoria: Secondary | ICD-10-CM

## 2015-10-12 DIAGNOSIS — F419 Anxiety disorder, unspecified: Secondary | ICD-10-CM | POA: Diagnosis not present

## 2015-10-12 DIAGNOSIS — Z8739 Personal history of other diseases of the musculoskeletal system and connective tissue: Secondary | ICD-10-CM | POA: Diagnosis not present

## 2015-10-12 DIAGNOSIS — Z634 Disappearance and death of family member: Secondary | ICD-10-CM | POA: Diagnosis not present

## 2015-10-12 DIAGNOSIS — Z862 Personal history of diseases of the blood and blood-forming organs and certain disorders involving the immune mechanism: Secondary | ICD-10-CM | POA: Diagnosis not present

## 2015-10-12 DIAGNOSIS — Z87442 Personal history of urinary calculi: Secondary | ICD-10-CM | POA: Diagnosis not present

## 2015-10-12 DIAGNOSIS — M25562 Pain in left knee: Secondary | ICD-10-CM

## 2015-10-12 DIAGNOSIS — R51 Headache: Secondary | ICD-10-CM | POA: Diagnosis present

## 2015-10-12 DIAGNOSIS — Z8719 Personal history of other diseases of the digestive system: Secondary | ICD-10-CM | POA: Insufficient documentation

## 2015-10-12 DIAGNOSIS — I252 Old myocardial infarction: Secondary | ICD-10-CM

## 2015-10-12 DIAGNOSIS — R110 Nausea: Secondary | ICD-10-CM | POA: Diagnosis not present

## 2015-10-12 DIAGNOSIS — R5383 Other fatigue: Secondary | ICD-10-CM

## 2015-10-12 DIAGNOSIS — Z79899 Other long term (current) drug therapy: Secondary | ICD-10-CM | POA: Insufficient documentation

## 2015-10-12 DIAGNOSIS — H469 Unspecified optic neuritis: Secondary | ICD-10-CM

## 2015-10-12 DIAGNOSIS — M352 Behcet's disease: Secondary | ICD-10-CM

## 2015-10-12 DIAGNOSIS — Z8639 Personal history of other endocrine, nutritional and metabolic disease: Secondary | ICD-10-CM | POA: Insufficient documentation

## 2015-10-12 DIAGNOSIS — G40909 Epilepsy, unspecified, not intractable, without status epilepticus: Secondary | ICD-10-CM | POA: Diagnosis not present

## 2015-10-12 DIAGNOSIS — M25552 Pain in left hip: Secondary | ICD-10-CM | POA: Diagnosis not present

## 2015-10-12 DIAGNOSIS — G43119 Migraine with aura, intractable, without status migrainosus: Secondary | ICD-10-CM | POA: Diagnosis not present

## 2015-10-12 LAB — BASIC METABOLIC PANEL
Anion gap: 11 (ref 5–15)
BUN: 13 mg/dL (ref 6–20)
CO2: 24 mmol/L (ref 22–32)
CREATININE: 0.76 mg/dL (ref 0.44–1.00)
Calcium: 9.2 mg/dL (ref 8.9–10.3)
Chloride: 106 mmol/L (ref 101–111)
Glucose, Bld: 84 mg/dL (ref 65–99)
POTASSIUM: 4.2 mmol/L (ref 3.5–5.1)
SODIUM: 141 mmol/L (ref 135–145)

## 2015-10-12 LAB — COMPLETE METABOLIC PANEL WITHOUT GFR
ALT: 21 U/L (ref 6–29)
AST: 25 U/L (ref 10–35)
BUN: 16 mg/dL (ref 7–25)
CO2: 24 mmol/L (ref 20–31)
Chloride: 103 mmol/L (ref 98–110)
Creat: 0.78 mg/dL (ref 0.50–1.05)
GFR, Est African American: >89 mL/min (ref >=60)
Glucose, Bld: 81 mg/dL (ref 65–99)
Total Bilirubin: 0.3 mg/dL (ref 0.2–1.2)
Total Protein: 7.6 g/dL (ref 6.1–8.1)

## 2015-10-12 LAB — CBC WITH DIFFERENTIAL/PLATELET
BASOS PCT: 0 %
Basophils Absolute: 0 10*3/uL (ref 0.0–0.1)
EOS ABS: 0.2 10*3/uL (ref 0.0–0.7)
EOS PCT: 4 %
HCT: 36.2 % (ref 36.0–46.0)
HEMOGLOBIN: 11.8 g/dL — AB (ref 12.0–15.0)
Lymphocytes Relative: 35 %
Lymphs Abs: 2.2 10*3/uL (ref 0.7–4.0)
MCH: 29.6 pg (ref 26.0–34.0)
MCHC: 32.6 g/dL (ref 30.0–36.0)
MCV: 91 fL (ref 78.0–100.0)
Monocytes Absolute: 0.4 10*3/uL (ref 0.1–1.0)
Monocytes Relative: 6 %
NEUTROS PCT: 55 %
Neutro Abs: 3.5 10*3/uL (ref 1.7–7.7)
PLATELETS: 301 10*3/uL (ref 150–400)
RBC: 3.98 MIL/uL (ref 3.87–5.11)
RDW: 12.3 % (ref 11.5–15.5)
WBC: 6.2 10*3/uL (ref 4.0–10.5)

## 2015-10-12 LAB — COMPLETE METABOLIC PANEL WITH GFR
Albumin: 4.3 g/dL (ref 3.6–5.1)
Alkaline Phosphatase: 90 U/L (ref 33–130)
Calcium: 9.9 mg/dL (ref 8.6–10.4)
GFR, Est Non African American: 88 mL/min (ref >=60)
Potassium: 4.5 mmol/L (ref 3.5–5.3)
Sodium: 139 mmol/L (ref 135–146)

## 2015-10-12 LAB — POCT CBC
Granulocyte percent: 63.4 %G (ref 37–80)
HCT, POC: 39.2 % (ref 37.7–47.9)
Hemoglobin: 13.2 g/dL (ref 12.2–16.2)
Lymph, poc: 2.8 (ref 0.6–3.4)
MCH, POC: 29.8 pg (ref 27–31.2)
MCHC: 33.7 g/dL (ref 31.8–35.4)
MCV: 88.6 fL (ref 80–97)
MID (cbc): 0.5 (ref 0–0.9)
MPV: 7.3 fL (ref 0–99.8)
POC Granulocyte: 5.8 (ref 2–6.9)
POC LYMPH PERCENT: 30.7 % (ref 10–50)
POC MID %: 5.9 %M (ref 0–12)
Platelet Count, POC: 357 10*3/uL (ref 142–424)
RBC: 4.42 M/uL (ref 4.04–5.48)
RDW, POC: 12.9 %
WBC: 9.2 10*3/uL (ref 4.6–10.2)

## 2015-10-12 LAB — TSH: TSH: 0.589 u[IU]/mL (ref 0.350–4.500)

## 2015-10-12 MED ORDER — ONDANSETRON HCL 4 MG/2ML IJ SOLN
4.0000 mg | Freq: Once | INTRAMUSCULAR | Status: AC
  Administered 2015-10-12: 4 mg via INTRAVENOUS
  Filled 2015-10-12: qty 2

## 2015-10-12 MED ORDER — MORPHINE SULFATE (PF) 4 MG/ML IV SOLN: 4.0000 mg | Freq: Once | INTRAVENOUS | Status: DC

## 2015-10-12 MED ORDER — SODIUM CHLORIDE 0.9 % IV BOLUS (SEPSIS): 500.0000 mL | Freq: Once | INTRAVENOUS | Status: DC

## 2015-10-12 MED ORDER — DIPHENHYDRAMINE HCL 50 MG/ML IJ SOLN
25.0000 mg | Freq: Once | INTRAMUSCULAR | Status: AC
  Administered 2015-10-12: 25 mg via INTRAVENOUS
  Filled 2015-10-12: qty 1

## 2015-10-12 MED ORDER — SODIUM CHLORIDE 0.9 % IV BOLUS (SEPSIS)
500.0000 mL | Freq: Once | INTRAVENOUS | Status: AC
  Administered 2015-10-12: 500 mL via INTRAVENOUS

## 2015-10-12 MED ORDER — HEPARIN SOD (PORK) LOCK FLUSH 100 UNIT/ML IV SOLN
500.0000 [IU] | Freq: Once | INTRAVENOUS | Status: AC
  Administered 2015-10-12: 500 [IU]
  Filled 2015-10-12: qty 5

## 2015-10-12 MED ORDER — PROCHLORPERAZINE EDISYLATE 5 MG/ML IJ SOLN
10.0000 mg | Freq: Once | INTRAMUSCULAR | Status: AC
  Administered 2015-10-12: 10 mg via INTRAVENOUS
  Filled 2015-10-12: qty 2

## 2015-10-12 MED ORDER — KETOROLAC TROMETHAMINE 30 MG/ML IJ SOLN
30.0000 mg | Freq: Once | INTRAMUSCULAR | Status: AC
  Administered 2015-10-12: 30 mg via INTRAVENOUS
  Filled 2015-10-12: qty 1

## 2015-10-12 NOTE — ED Provider Notes (Signed)
CSN: [id number]     Arrival date & time 10/12/15  1155 History   First MD Initiated Contact with Patient 10/12/15 1338     Chief Complaint  Patient presents with  . Headache     (Consider location/radiation/quality/duration/timing/severity/associated sxs/prior Treatment) HPI Comments: Patient presents to the ER for evaluation of headache. Patient reports a 4 day history of persistent and severe headache. Headache is global. She reports significant light sensitivity. She has had nausea and vomiting associated with her headaches. She went to her primary doctor today for evaluation and was found to have unequal pupils and was sent to the ER for further evaluation.  Ports previous history of similar headaches with Behcet's disease. 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 She has not noticed any significant visual impairment.   Her doctor prescribed her Norco for her headache. She reports that she does have some relief with a Norco, but the pain comes back as soon as the medicine wears off.  Patient is a 54 y.o. female presenting with headaches.  Headache Associated symptoms: nausea     Past Medical History  Diagnosis Date  . GERD (gastroesophageal reflux disease)   . Hyperlipidemia   . History of blood transfusion   . Chronic headaches   . Phlebitis   . History of colonic polyps   . Behcet's disease (HCC)     has port-a-cath  . Tonic clonic seizures (HCC)     Pseudo-Seizure  . Anxiety     "situational stress"  . Kidney stones [id number]  . Myocardial infarction [facility](HCC)    Past Surgical History  Procedure Laterality Date  . Cholecystectomy    . Tonsillectomy  1970  . Appendectomy  1975  . Lumbar fusion  1999    L5-S1  . Abdominal hysterectomy  1988  . Portacath placement Left 2008  . Stone extraction with basket    . Back surgery      L5-S1 ALIF  . Adenoidectomy  at 54 years old  . Oophorectomy  1987  . Radiology with anesthesia N/A 10/24/2014   Procedure: RADIOLOGY WITH ANESTHESIA;  Surgeon: Medication Radiologist, MD;  Location: [facility] OR;  Service: Radiology;  Laterality: N/A;   Family History  Problem Relation Age of Onset  . Diabetes Mother   . Hyperlipidemia Mother   . Stroke Mother   . Hypertension Mother   . Colon cancer Mother   . Breast cancer Mother   . Early death Father   . Heart attack Father   . Heart disease Father   . Hyperlipidemia Father   . COPD Sister   . Heart disease Maternal Grandmother   . Heart disease Maternal Grandfather   . Heart disease Paternal Grandmother   . Heart disease Paternal Grandfather    Social History  Substance Use Topics  . Smoking status: Never Smoker   . Smokeless tobacco: Never Used  . Alcohol Use: No   OB History    No data available     Review of Systems  Gastrointestinal: Positive for nausea.  Neurological: Positive for headaches.  All other systems reviewed and are negative.     Allergies  Hepatitis b vaccine; Imitrex; Iohexol; Contrast media; and Codeine  Home Medications   Prior to Admission medications   Medication Sig Start Date End Date Taking? Authorizing Provider  ALPRAZolam (XANAX) 0.5 MG tablet TAKE 1 TABLET BY MOUTH 3 TIMES A DAY AS NEEDED FOR ANXIETY 08/27/15  Yes Thao P Le, DO  aspirin  81 MG tablet Take 81 mg by mouth every morning.    Yes Historical Provider, MD  divalproex (DEPAKOTE) 250 MG DR tablet Take 2 tablets 3 times daily Patient taking differently: Take 500 mg by mouth 3 (three) times daily. Take 2 tablets 3 times daily 09/11/15  Yes Van Clines, MD  escitalopram (LEXAPRO) 10 MG tablet Take 1 tablet (10 mg total) by mouth daily. 05/06/15  Yes Elease Etienne, MD  gabapentin (NEURONTIN) 300 MG capsule TAKE 3 CAPSULES (900 MG TOTAL) BY MOUTH 3 (THREE) TIMES DAILY. 08/15/15  Yes Van Clines, MD  HYDROcodone-acetaminophen (NORCO/VICODIN) 5-325 MG tablet Take 1 tablet by mouth 3 (three) times daily as needed for moderate pain. 10/09/15  Yes  Thao P Le, DO  hydroxychloroquine (PLAQUENIL) 200 MG tablet Take 1 tablet (200 mg total) by mouth 2 (two) times daily. 05/03/15  Yes Thao P Le, DO  levETIRAcetam (KEPPRA) 500 MG tablet Take 2 tablets 2 times daily Patient taking differently: Take 1,000 mg by mouth 2 (two) times daily. Take 2 tablets 2 times daily 09/11/15  Yes Van Clines, MD  promethazine (PHENERGAN) 25 MG tablet TAKE 1 TABLET BY MOUTH EVERY 6 HOURS AS NEEDED FOR NAUSEA AND VOMITING 10/09/15  Yes Thao P Le, DO  topiramate (TOPAMAX) 100 MG tablet Take 1 tablet (100 mg total) by mouth daily. 01/03/15  Yes Van Clines, MD  zolpidem (AMBIEN) 10 MG tablet TAKE 1 TAB BY MOUTH AT BEDTIME AS NEEDED FOR SLEEP 08/16/15  Yes Thao P Le, DO   BP 121/84 mmHg  Pulse 85  Temp(Src) 98.3 F (36.8 C) (Oral)  Resp 16  Ht  (1.778 m)  Wt 193 lb 6.4 oz (87.726 kg)  BMI 27.75 kg/m2  SpO2 93% Physical Exam  Constitutional: She is oriented to person, place, and time. She appears well-developed and well-nourished. No distress.  HENT:  Head: Normocephalic and atraumatic.  Right Ear: Hearing normal.  Left Ear: Hearing normal.  Nose: Nose normal.  Mouth/Throat: Oropharynx is clear and moist and mucous membranes are normal.  Eyes: Conjunctivae and EOM are normal. Right eye exhibits normal extraocular motion. Left eye exhibits normal extraocular motion. Left pupil is not reactive.  Neck: Normal range of motion. Neck supple.  Cardiovascular: Regular rhythm, S1 normal and S2 normal.  Exam reveals no gallop and no friction rub.   No murmur heard. Pulmonary/Chest: Effort normal and breath sounds normal. No respiratory distress. She exhibits no tenderness.  Abdominal: Soft. Normal appearance and bowel sounds are normal. There is no hepatosplenomegaly. There is no tenderness. There is no rebound, no guarding, no tenderness at McBurney's point and negative Murphy's sign. No hernia.  Musculoskeletal: Normal range of motion.  Neurological: She is  alert and oriented to person, place, and time. She has normal strength. No cranial nerve deficit or sensory deficit. Coordination normal. GCS eye subscore is 4. GCS verbal subscore is 5. GCS motor subscore is 6.  Skin: Skin is warm, dry and intact. No rash noted. No cyanosis.  Psychiatric: She has a normal mood and affect. Her speech is normal and behavior is normal. Thought content normal.  Nursing note and vitals reviewed.   ED Course  Procedures (including critical care time) Labs Review Labs Reviewed  CBC WITH DIFFERENTIAL/PLATELET  BASIC METABOLIC PANEL    Imaging Review Ct Head Wo Contrast  10/12/2015  CLINICAL DATA:  Severe headache x4 days, unequal pupils, nausea. History of seizures, chronic headaches, and Behcet's disease. EXAM: CT  HEAD WITHOUT CONTRAST TECHNIQUE: Contiguous axial images were obtained from the base of the skull through the vertex without intravenous contrast. COMPARISON:  09/03/2015. FINDINGS: No evidence of parenchymal hemorrhage or extra-axial fluid collection. No mass lesion, mass effect, or midline shift. No CT evidence of acute infarction. Cerebral volume is within normal limits.  No ventriculomegaly. The visualized paranasal sinuses are essentially clear. The mastoid air cells are unopacified. No evidence of calvarial fracture. IMPRESSION: Normal head CT. Electronically Signed   By: Charline Bills M.D.   On: 10/12/2015 16:20   I have personally reviewed and evaluated these images and lab results as part of my medical decision-making.   EKG Interpretation None      MDM   Final diagnoses:  None   headache  Anisocoria  Patient presents to the ER for evaluation of headache. Patient reports that she has had progressive worsening of her headache over a period of 4 days. She has a history of chronic headache and migraines. She also, however, has history of Behcet's disease with ocular involvement. 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 Patient has not noticed any visual changes, however.  Patient has a nonfocal neurologic examination. The only finding is unreactive left pupil. This does not appear to be relatively fair pupillary defect, there is no movement of the people with light to either eye.  Presentation was discussed with Dr. Thad Ranger, neurology. She did not feel that the patient was at risk for recurrent optic neuritis if she is not having any vision changes. She did not recommend initial MRI or investigation for neuritis. She recommended treatment for migraine, if people regains movement after resolution of headache, no further workup necessary.  Will sign out to oncoming ER physician.    Gilda Crease, MD 10/12/15 [phone]

## 2015-10-12 NOTE — ED Notes (Signed)
Called Pharmacy to make aware of heparin flush

## 2015-10-12 NOTE — ED Notes (Addendum)
She reports history of autoimmune disorder with headaches. She went to PCP for treatment today and they sent her to ed for further evaluation of unequal pupils. She also c/o nausea. She has been taking norco x 4 days with no relief. She is A&Ox4, rsep e/u. She has portacath from chemo for the autoimmune disorder

## 2015-10-12 NOTE — ED Notes (Signed)
Phlebotomy contacted.  Unable to collect blood from port during access.  Pt states hx of no blood return, unable, due to pain, to adjust position or attempt other methods.    CT contacted and made aware of need for CT due to r/o head bleed.

## 2015-10-12 NOTE — Progress Notes (Signed)
Chief Complaint:  Chief Complaint  Patient presents with  . Headache  . Blurred Vision  . joint pain    Especially in hips and knees    HPI: Brenda Mcguire is a 54 y.o. female who reports to [facility] today complaining of:  1. Palpiatations, comes and goes. Not situational, it is intermittent, . It beats fast enough that her head is bobbing. She has some SOB with this.This has been going on for over 2 months.  She has had CP and SOB as well but again for the last 2-3 months. Non exertional. She has had a lot of stress, husband died in 2023/04/19. No Congestive heart failure sxs. She feels her ankle will swell when she sits for too long. , when hse gets up it is better. Her GERD is better.  2. Went to ER last week for ? seizure but CT was normal, they though it was just her migraine this was at [facility] ER, she did not have blood work but did have IVF, she felt better, Er dx her with migraine headaches. SHe has a neurology appt with Dr Karel Jarvis that needs to be set up. She ahs been seizure free for close to 1 year.  3. Dr Fabian November at [facility] for [place], she is able to make appt. She has had similar sxs to her Bechets flare up, hx of optic neuritis in left eye in the past. I had rx her steroid for 4 days but has not taken it. She has joint pain  4. 3-4 day hx of headache , some nausea, fuzzy blurred vision, no facial weakness, no slurred speech, she has had optic neuritis in the past with increase ocular pressure. Taking pain meds appropriately, not missing dose and overuse, no rebound HA. 5. KNee and hips on left side can sometimes flare up with Bechets. She denies any known trauma. No incontinence.   Past Medical History  Diagnosis Date  . GERD (gastroesophageal reflux disease)   . Hyperlipidemia   . History of blood transfusion   . Chronic headaches   . Phlebitis   . History of colonic polyps   . Behcet's disease (HCC)     has port-a-cath  . Tonic clonic seizures (HCC)    Pseudo-Seizure  . Anxiety     "situational stress"  . Kidney stones 4098,1191  . Myocardial infarction [facility])    Past Surgical History  Procedure Laterality Date  . Cholecystectomy    . Tonsillectomy  1970  . Appendectomy  1975  . Lumbar fusion  1999    L5-S1  . Abdominal hysterectomy  1988  . Portacath placement Left 19-Apr-2007  . Stone extraction with basket    . Back surgery      L5-S1 ALIF  . Adenoidectomy  at 54 years old  . Oophorectomy  1987  . Radiology with anesthesia N/A 10/24/2014    Procedure: RADIOLOGY WITH ANESTHESIA;  Surgeon: Medication Radiologist, MD;  Location: [facility] OR;  Service: Radiology;  Laterality: N/A;   Social History   Social History  . Marital Status: Widowed    Spouse Name: N/A  . Number of Children: 2  . Years of Education: 14   Occupational History  . Nurse     [employer]  .     Social History Main Topics  . Smoking status: Never Smoker   . Smokeless tobacco: Never Used  . Alcohol Use: No  . Drug Use: No  . Sexual Activity: Yes  Birth Control/ Protection: Surgical   Other Topics Concern  . None   Social History Narrative   Lives with husband.  They have 2 grown children.   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 Diabetes Mother   . Hyperlipidemia Mother   . Stroke Mother   . Hypertension Mother   . Colon cancer Mother   . Breast cancer Mother   . Early death Father   . Heart attack Father   . Heart disease Father   . Hyperlipidemia Father   . COPD Sister   . Heart disease Maternal Grandmother   . Heart disease Maternal Grandfather   . Heart disease Paternal Grandmother   . Heart disease Paternal Grandfather    Allergies  Allergen Reactions  . Hepatitis B Vaccine Hives and Shortness Of Breath    Causes wheezing   . Imitrex [Sumatriptan]  Other (See Comments)    Makes her have SVT and chest pain  . Iohexol Anaphylaxis, Hives and Shortness Of Breath  . Codeine Itching and Rash   Prior to Admission medications   Medication Sig Start Date End Date Taking? Authorizing Provider  ALPRAZolam (XANAX) 0.5 MG tablet TAKE 1 TABLET BY MOUTH 3 TIMES A DAY AS NEEDED FOR ANXIETY 08/27/15  Yes Doyle Kunath P Marieanne Marxen, DO  aspirin 81 MG tablet Take 81 mg by mouth every morning.    Yes Historical Provider, MD  divalproex (DEPAKOTE) 250 MG DR tablet Take 2 tablets 3 times daily 09/11/15  Yes Van ClinesKaren M Aquino, MD  escitalopram (LEXAPRO) 10 MG tablet Take 1 tablet (10 mg total) by mouth daily. 05/06/15  Yes Elease EtienneAnand D Hongalgi, MD  gabapentin (NEURONTIN) 300 MG capsule TAKE 3 CAPSULES (900 MG TOTAL) BY MOUTH 3 (THREE) TIMES DAILY. 08/15/15  Yes Van ClinesKaren M Aquino, MD  HYDROcodone-acetaminophen (NORCO/VICODIN) 5-325 MG tablet Take 1 tablet by mouth 3 (three) times daily as needed for moderate pain. 10/09/15  Yes Ameilia Rattan P Chyann Ambrocio, DO  hydroxychloroquine (PLAQUENIL) 200 MG tablet Take 1 tablet (200 mg total) by mouth 2 (two) times daily. 05/03/15  Yes Kristalynn Coddington P Tijana Walder, DO  levETIRAcetam (KEPPRA) 500 MG tablet Take 2 tablets 2 times daily 09/11/15  Yes Van ClinesKaren M Aquino, MD  promethazine (PHENERGAN) 25 MG tablet TAKE 1 TABLET BY MOUTH EVERY 6 HOURS AS NEEDED FOR NAUSEA AND VOMITING 10/09/15  Yes Citlali Gautney P Bre Pecina, DO  topiramate (TOPAMAX) 100 MG tablet Take 1 tablet (100 mg total) by mouth daily. 01/03/15  Yes Van ClinesKaren M Aquino, MD  zolpidem (AMBIEN) 10 MG tablet TAKE 1 TAB BY MOUTH AT BEDTIME AS NEEDED FOR SLEEP 08/16/15  Yes Yajaira Doffing P Odai Wimmer, DO     ROS: The patient denies fevers, chills, night sweats, unintentional weight loss, wheezing, dyspnea on exertion, nausea, vomiting, abdominal pain, dysuria, hematuria, melena, numbness,  or tingling.   All other systems have been reviewed and were otherwise negative with the exception of those mentioned in the HPI and as above.    PHYSICAL EXAM: Filed Vitals:   10/12/15  0915  BP: 116/78  Pulse: 98  Temp: 98.4 F (36.9 C)  Resp: 16   Body mass index is 29.5 kg/(m^2).   General: Alert, no acute distress, + anxious HEENT:  Normocephalic, atraumatic, oropharynx patent.+ nystagmus, + anisocria, left pupil dilated, minimal reactivity Cardiovascular:  Regular rate and rhythm, no rubs murmurs or gallops.  No Carotid bruits, radial pulse intact. No pedal edema.  Respiratory: Clear to auscultation bilaterally.  No wheezes, rales, or rhonchi.  No cyanosis, no use of accessory musculature Abdominal: No organomegaly, abdomen is soft and non-tender, positive bowel sounds. No masses. Skin: No rashes. Neurologic: Facial musculature symmetric. UE and Tramya Schoenfelder 5/5 strength, she has no swelling in knee, full ER and IR Psychiatric: Patient acts appropriately throughout our interaction. Lymphatic: No cervical or submandibular lymphadenopathy Musculoskeletal: Gait intact. No edema, tenderness   LABS: Results for orders placed or performed in visit on 10/12/15  POCT CBC  Result Value Ref Range   WBC 9.2 4.6 - 10.2 K/uL   Lymph, poc 2.8 0.6 - 3.4   POC LYMPH PERCENT 30.7 10 - 50 %L   MID (cbc) 0.5 0 - 0.9   POC MID % 5.9 0 - 12 %M   POC Granulocyte 5.8 2 - 6.9   Granulocyte percent 63.4 37 - 80 %G   RBC 4.42 4.04 - 5.48 M/uL   Hemoglobin 13.2 12.2 - 16.2 g/dL   HCT, POC 40.9 81.1 - 47.9 %   MCV 88.6 80 - 97 fL   MCH, POC 29.8 27 - 31.2 pg   MCHC 33.7 31.8 - 35.4 g/dL   RDW, POC 91.4 %   Platelet Count, POC 357 142 - 424 K/uL   MPV 7.3 0 - 99.8 fL     EKG/XRAY:   Primary read interpreted by Dr. Conley Rolls at [facility].   ASSESSMENT/PLAN: Encounter Diagnoses  Name Primary?  . Behcet's disease (HCC)   . Bereavement   . Optic neuritis   . Intractable migraine with aura without status migrainosus Yes  . History of non-ST elevation myocardial infarction (NSTEMI)   . Other fatigue   . Anisocoria   . Knee pain, acute, left   . 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  Other sxs CP and palpitations, SOB has been more chronic, Labs pending, refer to cardiology  Advise to go to ER for further evalaution of anisicoria and nystagmus on exam, maybe atypical migraine sxs but need further eval Will return for xrays of hip/knee prn, NKI so likely strain/sprain Denies SI/HI/halluciantions [facility] ER notified. Patient advise to go to ER now. Decline EMS, ok for now since VSS Fu prn   Gross sideeffects, risk and benefits, and alternatives of medications d/w patient. Patient is aware that all medications have potential sideeffects and we are unable to predict every sideeffect or drug-drug interaction that may occur.  Durk Carmen DO  10/12/2015 11:44 AM

## 2015-10-12 NOTE — ED Notes (Signed)
Pt fully dressed, standing in hall, requesting to walk out to her car to get her charger.  This RN made patient aware we have to wait for heparin flush to remove her port-a-cath access.  Pt anxious and states her daughter will get upset if her phone dies.  This RN will request necessary items.  MD made aware of pt's request for no additional medications and to go home.

## 2015-10-12 NOTE — ED Notes (Signed)
Pt able to ambulate and dress independently.  

## 2015-10-14 ENCOUNTER — Telehealth: Payer: Self-pay | Admitting: Family Medicine

## 2015-10-14 NOTE — Telephone Encounter (Signed)
Patient is requesting a note putting her out of work until tomorrow (however she is still running a fever and may need to be out longer). She states that the note has to mention her Bechet's disease. Please fax to [phone]   [phone]

## 2015-10-14 NOTE — Telephone Encounter (Signed)
Spoke with patient, will try to get optho and neuro work in appt tomorrow, not given to be out until Wednesday/Thrusday

## 2015-10-15 ENCOUNTER — Observation Stay (HOSPITAL_COMMUNITY)
Admission: EM | Admit: 2015-10-15 | Discharge: 2015-10-16 | Disposition: A | Discharge status: Home / Self Care | Payer: Managed Care, Other (non HMO) | Attending: Internal Medicine | Admitting: Internal Medicine

## 2015-10-15 ENCOUNTER — Encounter: Payer: Self-pay | Admitting: Family Medicine

## 2015-10-15 ENCOUNTER — Emergency Department (HOSPITAL_COMMUNITY): Payer: Managed Care, Other (non HMO)

## 2015-10-15 ENCOUNTER — Telehealth: Payer: Self-pay | Admitting: Family Medicine

## 2015-10-15 ENCOUNTER — Observation Stay (HOSPITAL_COMMUNITY): Payer: Managed Care, Other (non HMO)

## 2015-10-15 ENCOUNTER — Encounter (HOSPITAL_COMMUNITY): Payer: Self-pay | Admitting: Emergency Medicine

## 2015-10-15 DIAGNOSIS — Z91041 Radiographic dye allergy status: Secondary | ICD-10-CM | POA: Diagnosis not present

## 2015-10-15 DIAGNOSIS — Z885 Allergy status to narcotic agent status: Secondary | ICD-10-CM | POA: Insufficient documentation

## 2015-10-15 DIAGNOSIS — M352 Behcet's disease: Secondary | ICD-10-CM | POA: Diagnosis not present

## 2015-10-15 DIAGNOSIS — Z7982 Long term (current) use of aspirin: Secondary | ICD-10-CM | POA: Insufficient documentation

## 2015-10-15 DIAGNOSIS — R000 Tachycardia, unspecified: Secondary | ICD-10-CM | POA: Diagnosis not present

## 2015-10-15 DIAGNOSIS — K219 Gastro-esophageal reflux disease without esophagitis: Secondary | ICD-10-CM | POA: Diagnosis not present

## 2015-10-15 DIAGNOSIS — R4182 Altered mental status, unspecified: Secondary | ICD-10-CM | POA: Insufficient documentation

## 2015-10-15 DIAGNOSIS — Z888 Allergy status to other drugs, medicaments and biological substances status: Secondary | ICD-10-CM | POA: Insufficient documentation

## 2015-10-15 DIAGNOSIS — Z86010 Personal history of colonic polyps: Secondary | ICD-10-CM | POA: Insufficient documentation

## 2015-10-15 DIAGNOSIS — R918 Other nonspecific abnormal finding of lung field: Secondary | ICD-10-CM | POA: Insufficient documentation

## 2015-10-15 DIAGNOSIS — R569 Unspecified convulsions: Secondary | ICD-10-CM | POA: Diagnosis present

## 2015-10-15 DIAGNOSIS — R51 Headache: Secondary | ICD-10-CM | POA: Diagnosis not present

## 2015-10-15 DIAGNOSIS — E785 Hyperlipidemia, unspecified: Secondary | ICD-10-CM | POA: Diagnosis not present

## 2015-10-15 DIAGNOSIS — G40909 Epilepsy, unspecified, not intractable, without status epilepticus: Secondary | ICD-10-CM | POA: Insufficient documentation

## 2015-10-15 DIAGNOSIS — G9340 Encephalopathy, unspecified: Secondary | ICD-10-CM | POA: Diagnosis not present

## 2015-10-15 DIAGNOSIS — Z87442 Personal history of urinary calculi: Secondary | ICD-10-CM | POA: Diagnosis not present

## 2015-10-15 DIAGNOSIS — Z887 Allergy status to serum and vaccine status: Secondary | ICD-10-CM | POA: Insufficient documentation

## 2015-10-15 DIAGNOSIS — I252 Old myocardial infarction: Secondary | ICD-10-CM | POA: Insufficient documentation

## 2015-10-15 LAB — COMPREHENSIVE METABOLIC PANEL
ALBUMIN: 3.3 g/dL — AB (ref 3.5–5.0)
ALK PHOS: 73 U/L (ref 38–126)
ALT: 18 U/L (ref 14–54)
ANION GAP: 9 (ref 5–15)
AST: 21 U/L (ref 15–41)
BUN: 11 mg/dL (ref 6–20)
CALCIUM: 9.1 mg/dL (ref 8.9–10.3)
CO2: 22 mmol/L (ref 22–32)
Chloride: 106 mmol/L (ref 101–111)
Creatinine, Ser: 0.74 mg/dL (ref 0.44–1.00)
GFR calc Af Amer: >60 mL/min (ref >60)
GFR calc non Af Amer: >60 mL/min (ref >60)
GLUCOSE: 112 mg/dL — AB (ref 65–99)
Potassium: 4.1 mmol/L (ref 3.5–5.1)
SODIUM: 137 mmol/L (ref 135–145)
Total Bilirubin: 0.6 mg/dL (ref 0.3–1.2)
Total Protein: 6.7 g/dL (ref 6.5–8.1)

## 2015-10-15 LAB — URINALYSIS W MICROSCOPIC (NOT AT ARMC)
BILIRUBIN URINE: NEGATIVE
GLUCOSE, UA: NEGATIVE mg/dL
HGB URINE DIPSTICK: NEGATIVE
KETONES UR: 15 mg/dL — AB
NITRITE: NEGATIVE
PROTEIN: NEGATIVE mg/dL
Specific Gravity, Urine: 1.018 (ref 1.005–1.030)
Urobilinogen, UA: 1 mg/dL (ref 0.0–1.0)
pH: 8.5 — ABNORMAL HIGH (ref 5.0–8.0)

## 2015-10-15 LAB — CBC WITH DIFFERENTIAL/PLATELET
BASOS ABS: 0 10*3/uL (ref 0.0–0.1)
Basophils Relative: 0 %
Eosinophils Absolute: 0.4 10*3/uL (ref 0.0–0.7)
Eosinophils Relative: 3 %
HEMATOCRIT: 34.8 % — AB (ref 36.0–46.0)
HEMOGLOBIN: 11.2 g/dL — AB (ref 12.0–15.0)
LYMPHS PCT: 15 %
Lymphs Abs: 1.7 10*3/uL (ref 0.7–4.0)
MCH: 29.6 pg (ref 26.0–34.0)
MCHC: 32.2 g/dL (ref 30.0–36.0)
MCV: 92.1 fL (ref 78.0–100.0)
Monocytes Absolute: 0.7 10*3/uL (ref 0.1–1.0)
Monocytes Relative: 7 %
NEUTROS ABS: 8.3 10*3/uL — AB (ref 1.7–7.7)
Neutrophils Relative %: 75 %
PLATELETS: 249 10*3/uL (ref 150–400)
RBC: 3.78 MIL/uL — AB (ref 3.87–5.11)
RDW: 13.3 % (ref 11.5–15.5)
WBC: 11 10*3/uL — AB (ref 4.0–10.5)

## 2015-10-15 LAB — RAPID URINE DRUG SCREEN, HOSP PERFORMED
Amphetamines: NOT DETECTED
Barbiturates: NOT DETECTED
Benzodiazepines: POSITIVE — AB
COCAINE: NOT DETECTED
OPIATES: POSITIVE — AB
Tetrahydrocannabinol: NOT DETECTED

## 2015-10-15 LAB — BLOOD GAS, VENOUS

## 2015-10-15 LAB — SAMPLE TO BLOOD BANK

## 2015-10-15 LAB — I-STAT BETA HCG BLOOD, ED (MC, WL, AP ONLY): I-stat hCG, quantitative: <5 m[IU]/mL (ref <5)

## 2015-10-15 LAB — I-STAT VENOUS BLOOD GAS, ED
Acid-base deficit: 2 mmol/L (ref 0.0–2.0)
Bicarbonate: 21.6 mEq/L (ref 20.0–24.0)
O2 SAT: 96 %
PCO2 VEN: 30.9 mmHg — AB (ref 45.0–50.0)
PO2 VEN: 74 mmHg — AB (ref 30.0–45.0)
TCO2: 23 mmol/L (ref 0–100)
pH, Ven: 7.453 — ABNORMAL HIGH (ref 7.250–7.300)

## 2015-10-15 LAB — VALPROIC ACID LEVEL: Valproic Acid Lvl: 64 ug/mL (ref 50.0–100.0)

## 2015-10-15 LAB — PHOSPHORUS: PHOSPHORUS: 3.1 mg/dL (ref 2.5–4.6)

## 2015-10-15 LAB — SALICYLATE LEVEL

## 2015-10-15 LAB — MAGNESIUM: Magnesium: 1.9 mg/dL (ref 1.7–2.4)

## 2015-10-15 LAB — ETHANOL: Alcohol, Ethyl (B): <5 mg/dL (ref <5)

## 2015-10-15 LAB — CBG MONITORING, ED: GLUCOSE-CAPILLARY: 75 mg/dL (ref 65–99)

## 2015-10-15 MED ORDER — ACETAMINOPHEN 325 MG PO TABS
325.0000 mg | ORAL_TABLET | Freq: Once | ORAL | Status: AC
  Administered 2015-10-15: 325 mg via ORAL
  Filled 2015-10-15: qty 1

## 2015-10-15 MED ORDER — SODIUM CHLORIDE 0.9 % IV SOLN
INTRAVENOUS | Status: DC
  Administered 2015-10-15: 23:00:00 via INTRAVENOUS

## 2015-10-15 MED ORDER — SODIUM CHLORIDE 0.9 % IV BOLUS (SEPSIS)
1000.0000 mL | Freq: Once | INTRAVENOUS | Status: AC
  Administered 2015-10-15: 1000 mL via INTRAVENOUS

## 2015-10-15 MED ORDER — SODIUM CHLORIDE 0.9 % IJ SOLN
10.0000 mL | INTRAMUSCULAR | Status: DC | PRN
Start: 2015-10-15 — End: 2015-10-16

## 2015-10-15 NOTE — Telephone Encounter (Signed)
LM on voicemail about optho appt, asked med records to fax over, also sent email to patient since she did not pick up .  Cannot add lyme, rmsf, ehrlichia  to prior blood work due to time sensitive specimen, will see if she wants to come in for this.  Trying to set her up with neurology appt. Waiting for call back.

## 2015-10-15 NOTE — ED Notes (Signed)
IV flushed and patient c/o pain at the site
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
## 2015-10-15 NOTE — ED Notes (Signed)
Pt more alert when MD attempting to place US guided IV, pt states, "no, no, no, do not stick me."

## 2015-10-15 NOTE — Progress Notes (Signed)
Received report from ED. Pt currently in CT. Nelda MarseilleJenny Thacker, RN
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
## 2015-10-16 DIAGNOSIS — G9340 Encephalopathy, unspecified: Secondary | ICD-10-CM

## 2015-10-16 DIAGNOSIS — R569 Unspecified convulsions: Secondary | ICD-10-CM | POA: Diagnosis not present

## 2015-10-16 LAB — COMPREHENSIVE METABOLIC PANEL
ALK PHOS: 64 U/L (ref 38–126)
ALT: 16 U/L (ref 14–54)
ANION GAP: 6 (ref 5–15)
AST: 18 U/L (ref 15–41)
Albumin: 3 g/dL — ABNORMAL LOW (ref 3.5–5.0)
BILIRUBIN TOTAL: 0.6 mg/dL (ref 0.3–1.2)
BUN: 7 mg/dL (ref 6–20)
CALCIUM: 9.3 mg/dL (ref 8.9–10.3)
CO2: 23 mmol/L (ref 22–32)
CREATININE: 0.75 mg/dL (ref 0.44–1.00)
Chloride: 106 mmol/L (ref 101–111)
Glucose, Bld: 95 mg/dL (ref 65–99)
Potassium: 4 mmol/L (ref 3.5–5.1)
Sodium: 135 mmol/L (ref 135–145)
TOTAL PROTEIN: 6.3 g/dL — AB (ref 6.5–8.1)

## 2015-10-16 LAB — CBC WITH DIFFERENTIAL/PLATELET
BASOS ABS: 0 10*3/uL (ref 0.0–0.1)
BASOS PCT: 0 %
EOS ABS: 0.3 10*3/uL (ref 0.0–0.7)
Eosinophils Relative: 4 %
HEMATOCRIT: 33.6 % — AB (ref 36.0–46.0)
HEMOGLOBIN: 11 g/dL — AB (ref 12.0–15.0)
Lymphocytes Relative: 17 %
Lymphs Abs: 1.5 10*3/uL (ref 0.7–4.0)
MCH: 30.4 pg (ref 26.0–34.0)
MCHC: 32.7 g/dL (ref 30.0–36.0)
MCV: 92.8 fL (ref 78.0–100.0)
Monocytes Absolute: 0.6 10*3/uL (ref 0.1–1.0)
Monocytes Relative: 7 %
NEUTROS ABS: 6.5 10*3/uL (ref 1.7–7.7)
NEUTROS PCT: 72 %
Platelets: 247 10*3/uL (ref 150–400)
RBC: 3.62 MIL/uL — ABNORMAL LOW (ref 3.87–5.11)
RDW: 13.2 % (ref 11.5–15.5)
WBC: 9 10*3/uL (ref 4.0–10.5)

## 2015-10-16 MED ORDER — SODIUM CHLORIDE 0.9 % IV SOLN
INTRAVENOUS | Status: DC
  Administered 2015-10-16: 03:00:00 via INTRAVENOUS

## 2015-10-16 MED ORDER — ONDANSETRON HCL 4 MG PO TABS: 4.0000 mg | ORAL_TABLET | Freq: Four times a day (QID) | ORAL | Status: DC | PRN

## 2015-10-16 MED ORDER — HEPARIN SOD (PORK) LOCK FLUSH 100 UNIT/ML IV SOLN
500.0000 [IU] | INTRAVENOUS | Status: DC
  Filled 2015-10-16: qty 5

## 2015-10-16 MED ORDER — LORAZEPAM 2 MG/ML IJ SOLN: 1.0000 mg | INTRAMUSCULAR | Status: DC | PRN

## 2015-10-16 MED ORDER — ESCITALOPRAM OXALATE 10 MG PO TABS
10.0000 mg | ORAL_TABLET | Freq: Every day | ORAL | Status: DC
  Administered 2015-10-16: 10 mg via ORAL
  Filled 2015-10-16: qty 1

## 2015-10-16 MED ORDER — HYDROXYCHLOROQUINE SULFATE 200 MG PO TABS
200.0000 mg | ORAL_TABLET | Freq: Two times a day (BID) | ORAL | Status: DC
  Administered 2015-10-16: 200 mg via ORAL
  Filled 2015-10-16: qty 1

## 2015-10-16 MED ORDER — TOPIRAMATE 100 MG PO TABS
100.0000 mg | ORAL_TABLET | Freq: Every day | ORAL | Status: DC
  Administered 2015-10-16: 100 mg via ORAL
  Filled 2015-10-16: qty 1

## 2015-10-16 MED ORDER — ACETAMINOPHEN 650 MG RE SUPP
650.0000 mg | Freq: Four times a day (QID) | RECTAL | Status: DC | PRN
Start: 2015-10-16 — End: 2015-10-16

## 2015-10-16 MED ORDER — ONDANSETRON HCL 4 MG/2ML IJ SOLN: 4.0000 mg | Freq: Four times a day (QID) | INTRAMUSCULAR | Status: DC | PRN

## 2015-10-16 MED ORDER — ALPRAZOLAM 0.5 MG PO TABS: 0.5000 mg | ORAL_TABLET | Freq: Three times a day (TID) | ORAL | Status: DC | PRN

## 2015-10-16 MED ORDER — GABAPENTIN 300 MG PO CAPS
900.0000 mg | ORAL_CAPSULE | Freq: Three times a day (TID) | ORAL | Status: DC
  Administered 2015-10-16 (×2): 900 mg via ORAL
  Filled 2015-10-16 (×2): qty 3

## 2015-10-16 MED ORDER — SODIUM CHLORIDE 0.9 % IJ SOLN
10.0000 mL | INTRAMUSCULAR | Status: DC | PRN
  Administered 2015-10-16 (×2): 10 mL
  Filled 2015-10-16: qty 40

## 2015-10-16 MED ORDER — ACETAMINOPHEN 325 MG PO TABS: 650.0000 mg | ORAL_TABLET | Freq: Four times a day (QID) | ORAL | Status: DC | PRN

## 2015-10-16 MED ORDER — ASPIRIN 81 MG PO CHEW
81.0000 mg | CHEWABLE_TABLET | Freq: Every day | ORAL | Status: DC
  Administered 2015-10-16: 81 mg via ORAL
  Filled 2015-10-16: qty 1

## 2015-10-16 MED ORDER — ZOLPIDEM TARTRATE 5 MG PO TABS: 5.0000 mg | ORAL_TABLET | Freq: Every evening | ORAL | Status: DC | PRN

## 2015-10-16 MED ORDER — LEVETIRACETAM 500 MG PO TABS
1000.0000 mg | ORAL_TABLET | Freq: Two times a day (BID) | ORAL | Status: DC
  Administered 2015-10-16: 1000 mg via ORAL
  Filled 2015-10-16: qty 2

## 2015-10-16 MED ORDER — DIVALPROEX SODIUM 500 MG PO DR TAB
500.0000 mg | DELAYED_RELEASE_TABLET | Freq: Three times a day (TID) | ORAL | Status: DC
  Administered 2015-10-16 (×2): 500 mg via ORAL
  Filled 2015-10-16 (×3): qty 1

## 2015-10-16 MED ORDER — HEPARIN SOD (PORK) LOCK FLUSH 100 UNIT/ML IV SOLN
500.0000 [IU] | INTRAVENOUS | Status: DC | PRN
  Administered 2015-10-16: 500 [IU]
  Filled 2015-10-16 (×2): qty 5

## 2015-10-16 MED ORDER — ASPIRIN 81 MG PO TABS: 81.0000 mg | ORAL_TABLET | Freq: Every morning | ORAL | Status: DC

## 2015-10-16 NOTE — Progress Notes (Signed)
Reviewed discharge paperwork with pt.  Pt denied any needs at this time.  IV team called to de-access port.  Pt taken to discharge location via wheelchair.

## 2015-10-16 NOTE — H&P (Signed)
[facility] History and Physical  Brenda Mcguire ZOX:[id number] DOB: 11/11/1961 DOA: 10/15/2015  Referring physician: Dr.Liu. PCP: Rockne Coons, DO  Specialists: Dr.Aquino. Neurologist.  Chief Complaint: Seizures.  HPI: Brenda Mcguire is a 54 y.o. female with history of seizures was brought to the ER after patient was found to have a seizure episode. As per the ER physician patient was found to be driving erratically and police had to stop the patient's car. Patient was found to be confused and EMS was called. During which patient was found to have a tonic-clonic episode and was given Midozolam. Patient had another brief tonic-clonic episode in the ER which stopped without any intervention. Patient was confused following the episode. Patient has been admitted for further observation. On exam patient has become more alert and awake and states that 3 days ago patient had headache with fever and chills and patient's primary care physician and sent to the urgent care following which patient had CT head which was unremarkable. Patient was given Norco tablets for the headache and the last dose patient was last night almost 24 hours now. Patient states his morning she was not feeling well oriented but headache had largely resolved. Presently patient is nonfocal.   Review of Systems: As presented in the history of presenting illness, rest negative.  Past Medical History  Diagnosis Date  . GERD (gastroesophageal reflux disease)   . Hyperlipidemia   . History of blood transfusion   . Chronic headaches   . Phlebitis   . History of colonic polyps   . Behcet's disease (HCC)     has port-a-cath  . Tonic clonic seizures (HCC)     Pseudo-Seizure  . Anxiety     "situational stress"  . Kidney stones 8119,1478  . Myocardial infarction [facility])    Past Surgical History  Procedure Laterality Date  . Cholecystectomy    . Tonsillectomy  1970  . Appendectomy  1975  . Lumbar fusion  1999    L5-S1   . Abdominal hysterectomy  1988  . Portacath placement Left 2008  . Stone extraction with basket    . Back surgery      L5-S1 ALIF  . Adenoidectomy  at 54 years old  . Oophorectomy  1987  . Radiology with anesthesia N/A 10/24/2014    Procedure: RADIOLOGY WITH ANESTHESIA;  Surgeon: Medication Radiologist, MD;  Location: [facility] OR;  Service: Radiology;  Laterality: N/A;   Social History:  reports that she has never smoked. She has never used smokeless tobacco. She reports that she does not drink alcohol or use illicit drugs. Where does patient live home. Can patient participate in ADLs? Yes.  Allergies  Allergen Reactions  . Hepatitis B Vaccine Hives and Shortness Of Breath    Causes wheezing   . Imitrex [Sumatriptan] Other (See Comments)    Makes her have SVT and chest pain  . Iohexol Anaphylaxis, Hives and Shortness Of Breath  . Contrast Media [Iodinated Diagnostic Agents]   . Codeine Itching and Rash    Family History:  Family History  Problem Relation Age of Onset  . Diabetes Mother   . Hyperlipidemia Mother   . Stroke Mother   . Hypertension Mother   . Colon cancer Mother   . Breast cancer Mother   . Early death Father   . Heart attack Father   . Heart disease Father   . Hyperlipidemia Father   . COPD Sister   . Heart disease Maternal Grandmother   . Heart  disease Maternal Grandfather   . Heart disease Paternal Grandmother   . Heart disease Paternal Grandfather       Prior to Admission medications   Medication Sig Start Date End Date Taking? Authorizing Provider  ALPRAZolam (XANAX) 0.5 MG tablet TAKE 1 TABLET BY MOUTH 3 TIMES A DAY AS NEEDED FOR ANXIETY 08/27/15  Yes Thao P Le, DO  aspirin 81 MG tablet Take 81 mg by mouth every morning.    Yes Historical Provider, MD  divalproex (DEPAKOTE) 250 MG DR tablet Take 2 tablets 3 times daily Patient taking differently: Take 500 mg by mouth 3 (three) times daily. Take 2 tablets 3 times daily 09/11/15  Yes Van ClinesKaren M Aquino, MD   escitalopram (LEXAPRO) 10 MG tablet Take 1 tablet (10 mg total) by mouth daily. 05/06/15  Yes Elease EtienneAnand D Hongalgi, MD  gabapentin (NEURONTIN) 300 MG capsule TAKE 3 CAPSULES (900 MG TOTAL) BY MOUTH 3 (THREE) TIMES DAILY. 08/15/15  Yes Van ClinesKaren M Aquino, MD  HYDROcodone-acetaminophen (NORCO/VICODIN) 5-325 MG tablet Take 1 tablet by mouth 3 (three) times daily as needed for moderate pain. 10/09/15  Yes Thao P Le, DO  hydroxychloroquine (PLAQUENIL) 200 MG tablet Take 1 tablet (200 mg total) by mouth 2 (two) times daily. 05/03/15  Yes Thao P Le, DO  levETIRAcetam (KEPPRA) 500 MG tablet Take 2 tablets 2 times daily Patient taking differently: Take 1,000 mg by mouth 2 (two) times daily. Take 2 tablets 2 times daily 09/11/15  Yes Van ClinesKaren M Aquino, MD  promethazine (PHENERGAN) 25 MG tablet TAKE 1 TABLET BY MOUTH EVERY 6 HOURS AS NEEDED FOR NAUSEA AND VOMITING 10/09/15  Yes Thao P Le, DO  topiramate (TOPAMAX) 100 MG tablet Take 1 tablet (100 mg total) by mouth daily. 01/03/15  Yes Van ClinesKaren M Aquino, MD  zolpidem (AMBIEN) 10 MG tablet TAKE 1 TAB BY MOUTH AT BEDTIME AS NEEDED FOR SLEEP 08/16/15  Yes Thao P Le, DO    Physical Exam: Filed Vitals:   10/15/15 2204 10/15/15 2230 10/15/15 2300 10/15/15 2315  BP: 104/46 102/62 115/60 114/65  Pulse: 84     Temp:      TempSrc:      Resp: 14 19 15    SpO2: 96%        General:  Moderately built and nourished.  Eyes: Anicteric no pallor.  ENT: No discharge from the ears eyes nose or mouth.  Neck: No neck rigidity. No mass felt.  Cardiovascular: S1-S2 heard.  Respiratory: No rhonchi or crepitations.  Abdomen: Soft nontender bowel sounds present.  Skin: No rash.  Musculoskeletal: No edema.  Psychiatric: Appears normal.  Neurologic: Alert awake oriented to time place and person. Moves all extremities.  Labs on Admission:  Basic Metabolic Panel:  Recent Labs Lab 10/12/15 1015 10/12/15 1712 10/15/15 1636  NA 139 141 137  K 4.5 4.2 4.1  CL 103 106 106  CO2  24 24 22   GLUCOSE 81 84 112*  BUN 16 13 11   CREATININE 0.78 0.76 0.74  CALCIUM 9.9 9.2 9.1  MG  --   --  1.9  PHOS  --   --  3.1   Liver Function Tests:  Recent Labs Lab 10/12/15 1015 10/15/15 1636  AST 25 21  ALT 21 18  ALKPHOS 90 73  BILITOT 0.3 0.6  PROT 7.6 6.7  ALBUMIN 4.3 3.3*   No results for input(s): LIPASE, AMYLASE in the last 168 hours. No results for input(s): AMMONIA in the last 168 hours. CBC:  Recent  Labs Lab 10/12/15 1104 10/12/15 1712 10/15/15 1636  WBC 9.2 6.2 11.0*  NEUTROABS  --  3.5 8.3*  HGB 13.2 11.8* 11.2*  HCT 39.2 36.2 34.8*  MCV 88.6 91.0 92.1  PLT  --  301 249   Cardiac Enzymes: No results for input(s): CKTOTAL, CKMB, CKMBINDEX, TROPONINI in the last 168 hours.  BNP (last 3 results) No results for input(s): BNP in the last 8760 hours.  ProBNP (last 3 results) No results for input(s): PROBNP in the last 8760 hours.  CBG:  Recent Labs Lab 10/15/15 1652  GLUCAP 75    Radiological Exams on Admission: Dg Chest Portable 1 View  10/15/2015  CLINICAL DATA:  Hypotension EXAM: PORTABLE CHEST 1 VIEW COMPARISON:  April 05, 2015 FINDINGS: There is patchy infiltrate throughout much of the right lung. The left lung is clear. Heart size and pulmonary vascularity are normal. No adenopathy. Port-A-Cath tip is in the superior vena cava near the cavoatrial junction. No pneumothorax. IMPRESSION: Patchy infiltrate throughout much of the right lung. This appearance is consistent with pneumonia or aspiration. Left lung clear. No change in cardiac silhouette. Electronically Signed   By: Bretta Bang III M.D.   On: 10/15/2015 21:48    EKG: Independently reviewed. Sinus tachycardia.  Assessment/Plan Principal Problem:   Acute encephalopathy Active Problems:   Seizures (HCC)   1. Acute encephalopathy most likely postictal secondary to seizures - essentially resolved at this time. I have discussed with on-call neurologist Dr. Hosie Poisson who at this  time is advised to continue with present antiepileptics but to discontinue Norco and advised not to take it which may be his treating seizures. Patient has known neurologist Dr. Karel Jarvis and patient to follow-up with her neurologist as outpatient for any further titration of medications. At discharge patient will need to be advised no driving until cleared by neurologist. 2. Anemia - follow CBC.   DVT Prophylaxis SCDs.  Code Status: Full code.  Family Communication: Discussed with patient.  Disposition Plan: Admit for observation.    Ricke Kimoto N. [facility] Pager [phone].  If 7PM-7AM, please contact night-coverage [email or web address] Password [email or web address] 10/16/2015, 12:05 AM

## 2015-10-16 NOTE — Evaluation (Addendum)
Physical Therapy Evaluation Patient Details Name: Brenda MannanWanda Mcguire MRN: [id number] DOB: March 31, 1961 Today's Date: 10/16/2015   History of Present Illness  Pt adm with seizures and encephalopathy. PMH -   back surgery, seizure. bechets syndrome  Clinical Impression  Pt doing well with mobility and no further PT needed.  Ready for dc from PT standpoint.      Follow Up Recommendations No PT follow up    Equipment Recommendations  None recommended by PT    Recommendations for Other Services       Precautions / Restrictions Precautions Precautions: None      Mobility  Bed Mobility Overal bed mobility: Independent                Transfers Overall transfer level: Independent                  Ambulation/Gait Ambulation/Gait assistance: Independent Ambulation Distance (Feet): 400 Feet Assistive device: None Gait Pattern/deviations: WFL(Within Functional Limits)   Gait velocity interpretation: at or above normal speed for age/gender    Stairs            Wheelchair Mobility    Modified Rankin (Stroke Patients Only)       Balance Overall balance assessment: No apparent balance deficits (not formally assessed)                                           Pertinent Vitals/Pain Pain Assessment: No/denies pain    Home Living Family/patient expects to be discharged to:: Private residence Living Arrangements: Alone   Type of Home: House Home Access: Stairs to enter Entrance Stairs-Rails: None Entrance Stairs-Number of Steps: 2 Home Layout: Two level        Prior Function Level of Independence: Independent with assistive device(s)               Hand Dominance   Dominant Hand: Right    Extremity/Trunk Assessment   Upper Extremity Assessment: Overall WFL for tasks assessed           Lower Extremity Assessment: Overall WFL for tasks assessed         Communication   Communication: No difficulties  Cognition  Arousal/Alertness: Awake/alert Behavior During Therapy: WFL for tasks assessed/performed Overall Cognitive Status: Within Functional Limits for tasks assessed                      General Comments      Exercises        Assessment/Plan    PT Assessment Patent does not need any further PT services  PT Diagnosis Altered mental status   PT Problem List    PT Treatment Interventions     PT Goals (Current goals can be found in the Care Plan section) Acute Rehab PT Goals PT Goal Formulation: All assessment and education complete, DC therapy    Frequency     Barriers to discharge        Co-evaluation               End of Session   Activity Tolerance: Patient tolerated treatment well Patient left: in bed;with call bell/phone within reach;with bed alarm set Nurse Communication: Mobility status    Functional Assessment Tool Used: clinical judgement Functional Limitation: Mobility: Walking and moving around Mobility: Walking and Moving Around Current Status ([id number]): 0 percent impaired, limited or restricted Mobility: Walking  and Moving Around Goal Status [phone]): 0 percent impaired, limited or restricted Mobility: Walking and Moving Around Discharge Status [phone]): 0 percent impaired, limited or restricted    Time: 1207-1222 PT Time Calculation (min) (ACUTE ONLY): 15 min   Charges:   PT Evaluation $Initial PT Evaluation Tier I: 1 Procedure     PT G Codes:   PT G-Codes **NOT FOR INPATIENT CLASS** Functional Assessment Tool Used: clinical judgement Functional Limitation: Mobility: Walking and moving around Mobility: Walking and Moving Around Current Status (U9811): 0 percent impaired, limited or restricted Mobility: Walking and Moving Around Goal Status (B1478): 0 percent impaired, limited or restricted Mobility: Walking and Moving Around Discharge Status [phone]): 0 percent impaired, limited or restricted    Jarrett Albor 10/16/2015, 2:07 PM [facility]  PT [phone]

## 2015-10-16 NOTE — Progress Notes (Signed)
   10/16/15 1316  Clinical Encounter Type  Visited With Patient;Health care provider  Visit Type Initial  Referral From Nurse  Spiritual Encounters  Spiritual Needs Prayer;Emotional;Grief support  Stress Factors  Patient Stress Factors Health changes;Lack of knowledge;Loss   Chaplain responded to a request from a nurse to visit with the patient who is emotional. Patient has a lot going on. She is worried about what her recent seizure means for her ability to drive, get to work, and how that impacts her living arrangements. Patient also lost her husband of 17 years in April, and still wants to be in her home around the memories of her husband. Chaplain offered the patient support and prayer, and our support is available as needed.   Alda PonderAdam M Nickola Lenig, Chaplain 10/16/2015 1:18 PM

## 2015-10-16 NOTE — Discharge Summary (Signed)
[facility]  Physician Discharge Summary   Patient ID: Brenda Mcguire DOB/AGE: 1961/07/19 54 y.o.  Admit date: 10/15/2015 Discharge date: 10/16/2015  PCP: LE, THAO PHUONG, DO  DISCHARGE DIAGNOSES:  Principal Problem:   Acute encephalopathy Active Problems:   Seizures (HCC)   RECOMMENDATIONS FOR OUTPATIENT FOLLOW UP: 1. Referral sent to Dr. Rosalyn Gess office to arrange close follow-up 2. Patient instructed not to drive till further notice   DISCHARGE CONDITION: fair  Diet recommendation: Low sodium  There were no vitals filed for this visit.  INITIAL HISTORY: This is a 54 year old Caucasian female with a past medical history of seizure was brought in due to seizure activity while she was driving. Apparently over the past few days. Patient has been having headache with fever and chills. CT head was done which was unremarkable. She was prescribed Norco tablets for headache. She was hospitalized for observation.  Consultations:  Phone discussion with neurology  Procedures:  None  HOSPITAL COURSE:   Acute encephalopathy Secondary to postictal state from seizure. Mental status almost back to baseline.  Seizures in the setting of known seizure disorder Based on discussions with neurology at the time of admission it appears that use of Norco may have lowered her seizure threshold. Patient has been instructed to stop taking this medication. Referral sent to her neurologist for close follow-up. She has been seizure-free for the past 24 hours. Mental status is back to baseline. She states that her daughter will drive her home. She understands that she cannot drive until she has been cleared by her neurologist. She states that she has friends and family who can help her. She'll be discharged on same doses of her home medications. She'll be asked to stop taking Norco/Vicodin.  She may continue her other home medications as before. She is very keen on going  home. She's been asked to seek attention if she has more episodes of seizures.  Seen by physical therapy. She appears to be back to baseline. Okay for discharge.    PERTINENT LABS:  The results of significant diagnostics from this hospitalization (including imaging, microbiology, ancillary and laboratory) are listed below for reference.     Labs: Basic Metabolic Panel:  Recent Labs Lab 10/12/15 1015 10/12/15 1712 10/15/15 1636 10/16/15 0618  NA 139 141 137 135  K 4.5 4.2 4.1 4.0  CL 103 106 106 106  CO2 GLUCOSE 81 84 112* 95  BUN CREATININE 0.78 0.76 0.74 0.75  CALCIUM 9.9 9.2 9.1 9.3  MG  --   --  1.9  --   PHOS  --   --  3.1  --    Liver Function Tests:  Recent Labs Lab 10/12/15 1015 10/15/15 1636 10/16/15 0618  AST ALT ALKPHOS 90 73 64  BILITOT 0.3 0.6 0.6  PROT 7.6 6.7 6.3*  ALBUMIN 4.3 3.3* 3.0*   CBC:  Recent Labs Lab 10/12/15 1104 10/12/15 1712 10/15/15 1636 10/16/15 0618  WBC 9.2 6.2 11.0* 9.0  NEUTROABS  --  3.5 8.3* 6.5  HGB 13.2 11.8* 11.2* 11.0*  HCT 39.2 36.2 34.8* 33.6*  MCV 88.6 91.0 92.1 92.8  PLT  --  301 249 247   CBG:  Recent Labs Lab 10/15/15 1652  GLUCAP 75     IMAGING STUDIES Ct Head Wo Contrast  10/16/2015  CLINICAL DATA:  54 year old with generalized headache over the past 4 days associated  with acute mental status changes. EXAM: CT HEAD WITHOUT CONTRAST TECHNIQUE: Contiguous axial images were obtained from the base of the skull through the vertex without intravenous contrast. COMPARISON:  Greater than 20 prior head CTs dating back to October, 2006, most recently 10/12/2015. FINDINGS: Ventricular system normal in size and appearance for age. No mass lesion. No midline shift. No acute hemorrhage or hematoma. No extra-axial fluid collections. No evidence of acute infarction. No focal brain parenchymal abnormalities. No focal osseous abnormalities involving the skull. Visualized  paranasal sinuses, bilateral mastoid air cells, and bilateral middle ear cavities well-aerated. IMPRESSION: Normal examination. Electronically Signed   By: Hulan Saas M.D.   On: 10/16/2015 00:08   Ct Head Wo Contrast  10/12/2015  CLINICAL DATA:  Severe headache x4 days, unequal pupils, nausea. History of seizures, chronic headaches, and Behcet's disease. EXAM: CT HEAD WITHOUT CONTRAST TECHNIQUE: Contiguous axial images were obtained from the base of the skull through the vertex without intravenous contrast. COMPARISON:  09/03/2015. FINDINGS: No evidence of parenchymal hemorrhage or extra-axial fluid collection. No mass lesion, mass effect, or midline shift. No CT evidence of acute infarction. Cerebral volume is within normal limits.  No ventriculomegaly. The visualized paranasal sinuses are essentially clear. The mastoid air cells are unopacified. No evidence of calvarial fracture. IMPRESSION: Normal head CT. Electronically Signed   By: Charline Bills M.D.   On: 10/12/2015 16:20   Dg Chest Portable 1 View  10/15/2015  CLINICAL DATA:  Hypotension EXAM: PORTABLE CHEST 1 VIEW COMPARISON:  April 05, 2015 FINDINGS: There is patchy infiltrate throughout much of the right lung. The left lung is clear. Heart size and pulmonary vascularity are normal. No adenopathy. Port-A-Cath tip is in the superior vena cava near the cavoatrial junction. No pneumothorax. IMPRESSION: Patchy infiltrate throughout much of the right lung. This appearance is consistent with pneumonia or aspiration. Left lung clear. No change in cardiac silhouette. Electronically Signed   By: Bretta Bang III M.D.   On: 10/15/2015 21:48    DISCHARGE EXAMINATION: Filed Vitals:   10/15/15 2315 10/16/15 0026 10/16/15 0508 10/16/15 1432  BP: 114/65 125/78 127/73 124/75  Pulse:  85 95 80  Temp:  98.8 F (37.1 C) 99 F (37.2 C) 99 F (37.2 C)  TempSrc:  Oral Oral Oral  Resp:  SpO2:  97% 84% 98%   General appearance:  alert, cooperative, appears stated age and no distress Resp: clear to auscultation bilaterally Cardio: regular rate and rhythm, S1, S2 normal, no murmur, click, rub or gallop GI: soft, non-tender; bowel sounds normal; no masses,  no organomegaly Extremities: extremities normal, atraumatic, no cyanosis or edema  DISPOSITION: Home with daughter  Discharge Instructions    Ambulatory referral to Neurology    Complete by:  As directed   An appointment is requested in approximately: 1 week or earlier if possible. Patient presented to the hospital with seizure.     Call MD for:  difficulty breathing, headache or visual disturbances    Complete by:  As directed      Call MD for:  extreme fatigue    Complete by:  As directed      Call MD for:  persistant dizziness or light-headedness    Complete by:  As directed      Call MD for:  persistant nausea and vomiting    Complete by:  As directed      Call MD for:  severe uncontrolled pain    Complete by:  As directed      Call MD for:  temperature >100.4    Complete by:  As directed      Diet - low sodium heart healthy    Complete by:  As directed      Discharge instructions    Complete by:  As directed   Please stop taking Vicdoin as instructed. Please be sure to follow up with Dr. Karel Jarvis soon. Call her office if you don't hear anything by Friday, Nov 4. Seek attention if you experience more episodes of seizure.  You were cared for by a hospitalist during your hospital stay. If you have any questions about your discharge medications or the care you received while you were in the hospital after you are discharged, you can call the unit and asked to speak with the hospitalist on call if the hospitalist that took care of you is not available. Once you are discharged, your primary care physician will handle any further medical issues. Please note that NO REFILLS for any discharge medications will be authorized once you are discharged, as it is imperative  that you return to your primary care physician (or establish a relationship with a primary care physician if you do not have one) for your aftercare needs so that they can reassess your need for medications and monitor your lab values. If you do not have a primary care physician, you can call [phone] for a physician referral.     Driving Restrictions    Complete by:  As directed   PLEASE DO NOT DRIVE TILL CLEARED TO DO SO BY DR. Karel Jarvis.     Increase activity slowly    Complete by:  As directed            ALLERGIES:  Allergies  Allergen Reactions  . Hepatitis B Vaccine Hives and Shortness Of Breath    Causes wheezing   . Imitrex [Sumatriptan] Other (See Comments)    Makes her have SVT and chest pain  . Iohexol Anaphylaxis, Hives and Shortness Of Breath  . Contrast Media [Iodinated Diagnostic Agents]   . Codeine Itching and Rash      Current Discharge Medication List    CONTINUE these medications which have NOT CHANGED   Details  ALPRAZolam (XANAX) 0.5 MG tablet TAKE 1 TABLET BY MOUTH 3 TIMES A DAY AS NEEDED FOR ANXIETY Qty: 90 tablet, Refills: 1    aspirin 81 MG tablet Take 81 mg by mouth every morning.     divalproex (DEPAKOTE) 250 MG DR tablet Take 2 tablets 3 times daily Qty: 180 tablet, Refills: 3    escitalopram (LEXAPRO) 10 MG tablet Take 1 tablet (10 mg total) by mouth daily. Qty: 30 tablet, Refills: 0    gabapentin (NEURONTIN) 300 MG capsule TAKE 3 CAPSULES (900 MG TOTAL) BY MOUTH 3 (THREE) TIMES DAILY. Qty: 270 capsule, Refills: 2    hydroxychloroquine (PLAQUENIL) 200 MG tablet Take 1 tablet (200 mg total) by mouth 2 (two) times daily. Qty: 60 tablet, Refills: 5   Associated Diagnoses: Behcet's disease (HCC); Bereavement; Anxiety; Insomnia; Other type of migraine; Optic neuritis; Seizures (HCC); Encounter for screening colonoscopy; Gastroesophageal reflux disease without esophagitis; Screening for breast cancer    levETIRAcetam (KEPPRA) 500 MG tablet Take 2  tablets 2 times daily Qty: 120 tablet, Refills: 3    promethazine (PHENERGAN) 25 MG tablet TAKE 1 TABLET BY MOUTH EVERY 6 HOURS AS NEEDED FOR NAUSEA AND VOMITING Qty: 20 tablet, Refills: 0    topiramate (TOPAMAX)  100 MG tablet Take 1 tablet (100 mg total) by mouth daily. Qty: 30 tablet, Refills: 6    zolpidem (AMBIEN) 10 MG tablet TAKE 1 TAB BY MOUTH AT BEDTIME AS NEEDED FOR SLEEP Qty: 30 tablet, Refills: 2      STOP taking these medications     HYDROcodone-acetaminophen (NORCO/VICODIN) 5-325 MG tablet        Follow-up Information    Follow up with LE, THAO PHUONG, DO In 1 week.   Specialty:  Family Medicine   Contact information:   [address] [place] [address] [phone]       Follow up with Van ClinesAquino,Karen M, MD.   Specialty:  Neurology   Why:  please call her office if you do not hear anything by Friday   Contact information:   [address] [place] [address] [phone]       TOTAL DISCHARGE TIME: 35 mins  [facility]urg[facility]anoKRISHNAN,Briyan Kleven  [facility] Pager [phone]  10/16/2015, 2:52 PM

## 2015-10-16 NOTE — Discharge Instructions (Addendum)
PLEASE DO NOT DRIVE TILL CLEARED BY YOUR NEUROLOGIST.    Seizure, Adult A seizure is abnormal electrical activity in the brain. Seizures usually last from 30 seconds to 2 minutes. There are various types of seizures. Before a seizure, you may have a warning sensation (aura) that a seizure is about to occur. An aura may include the following symptoms:   Fear or anxiety.  Nausea.  Feeling like the room is spinning (vertigo).  Vision changes, such as seeing flashing lights or spots. Common symptoms during a seizure include:  A change in attention or behavior (altered mental status).  Convulsions with rhythmic jerking movements.  Drooling.  Rapid eye movements.  Grunting.  Loss of bladder and bowel control.  Bitter taste in the mouth.  Tongue biting. After a seizure, you may feel confused and sleepy. You may also have an injury resulting from convulsions during the seizure. HOME CARE INSTRUCTIONS   If you are given medicines, take them exactly as prescribed by your health care provider.  Keep all follow-up appointments as directed by your health care provider.  Do not swim or drive or engage in risky activity during which a seizure could cause further injury to you or others until your health care provider says it is OK.  Get adequate rest.  Teach friends and family what to do if you have a seizure. They should:  Lay you on the ground to prevent a fall.  Put a cushion under your head.  Loosen any tight clothing around your neck.  Turn you on your side. If vomiting occurs, this helps keep your airway clear.  Stay with you until you recover.  Know whether or not you need emergency care. SEEK IMMEDIATE MEDICAL CARE IF:  The seizure lasts longer than 5 minutes.  The seizure is severe or you do not wake up immediately after the seizure.  You have an altered mental status after the seizure.  You are having more frequent or worsening seizures. Someone should  drive you to the emergency department or call local emergency services (911 in [place] MAKE SURE YOU:  Understand these instructions.  Will watch your condition.  Will get help right away if you are not doing well or get worse.   This information is not intended to replace advice given to you by your health care provider. Make sure you discuss any questions you have with your health care provider.   Document Released: 11/27/2000 Document Revised: 12/21/2014 Document Reviewed: 07/12/2013 Elsevier Interactive Patient Education [employer]2016 Elsevier Inc.

## 2015-10-17 ENCOUNTER — Encounter: Payer: Self-pay | Admitting: Family Medicine

## 2015-10-17 ENCOUNTER — Encounter: Payer: Self-pay | Admitting: Neurology

## 2015-10-17 ENCOUNTER — Telehealth (HOSPITAL_BASED_OUTPATIENT_CLINIC_OR_DEPARTMENT_OTHER): Payer: Self-pay | Admitting: Emergency Medicine

## 2015-10-18 ENCOUNTER — Telehealth: Payer: Self-pay

## 2015-10-18 ENCOUNTER — Telehealth: Payer: Self-pay | Admitting: Family Medicine

## 2015-10-18 ENCOUNTER — Encounter: Payer: Self-pay | Admitting: Neurology

## 2015-10-18 DIAGNOSIS — J189 Pneumonia, unspecified organism: Secondary | ICD-10-CM

## 2015-10-18 MED ORDER — AMOXICILLIN-POT CLAVULANATE 875-125 MG PO TABS: 1.0000 | ORAL_TABLET | Freq: Two times a day (BID) | ORAL | Status: DC

## 2015-10-18 NOTE — Telephone Encounter (Signed)
Spoke with Burna MortimerWanda, she recently was dc from hospital with seizures, CT neg,  xray showed also patchy infiltrate c/w PNA/aspiration in right lung , she denies aspirating, no abx when dc so will give augmentin, she will be seen by me on Tuesday for release to work, she will see Dr Karel JarvisAquino on Nov 14 at 10:45 for hospital dc f/u.  I will fax over note to her work to state she was recently hospitalized for PNA and will be out until further evaluation on Tuesday. She is ok with this wording. No abx allergies that she is aware of.

## 2015-10-18 NOTE — Telephone Encounter (Signed)
Done

## 2015-10-18 NOTE — Telephone Encounter (Signed)
-----   Message from Lenell Antuhao P Le, DO sent at 10/18/2015  2:37 PM EDT ----- Regarding: out of work note Can you print out the letter I wrote for Burna MortimerWanda today and  Fax it to [employer] at [phone].   I routed to you guys. Don;t know if you can see it.   Thanks, Dr Conley RollsLe

## 2015-10-22 ENCOUNTER — Encounter: Payer: Self-pay | Admitting: Family Medicine

## 2015-10-22 ENCOUNTER — Ambulatory Visit (INDEPENDENT_AMBULATORY_CARE_PROVIDER_SITE_OTHER): Payer: Federal, State, Local not specified - PPO | Admitting: Family Medicine

## 2015-10-22 VITALS — BP 136/84 | HR 90 | Temp 98.4°F | Resp 16 | Ht 70.0 in | Wt 187.2 lb

## 2015-10-22 DIAGNOSIS — M352 Behcet's disease: Secondary | ICD-10-CM

## 2015-10-22 DIAGNOSIS — Z634 Disappearance and death of family member: Secondary | ICD-10-CM

## 2015-10-22 DIAGNOSIS — Z09 Encounter for follow-up examination after completed treatment for conditions other than malignant neoplasm: Secondary | ICD-10-CM | POA: Diagnosis not present

## 2015-10-22 DIAGNOSIS — R519 Headache, unspecified: Secondary | ICD-10-CM

## 2015-10-22 DIAGNOSIS — J189 Pneumonia, unspecified organism: Secondary | ICD-10-CM

## 2015-10-22 DIAGNOSIS — R51 Headache: Secondary | ICD-10-CM

## 2015-10-22 DIAGNOSIS — G8929 Other chronic pain: Secondary | ICD-10-CM

## 2015-10-22 NOTE — Patient Instructions (Signed)

## 2015-10-22 NOTE — Progress Notes (Signed)
Chief Complaint:  Chief Complaint  Patient presents with  . Follow-up    HPI: Brenda Mcguire is a 54 y.o. female who reports to [facility] today for follow-up after hospital DC. She was in the ER from 11/1-11/2. The history is different per patient and what is written in the EMR  Admission note: Patient initially presented unresponsive, status post seizure activity. Patient reportedly hypotensive per EMS. Initially was responsive. Was seen driving erratically and was pulled over by police. Patient hypotensive to 90 systolic per EMS. Patient initially appeared diaphoretic. Patient began responsive with attempts to place IVs. She received intranasal Versed by EMS. They also reported some extensor posturing. Patient was initially hypertensive however blood pressures were difficult to obtain as patient was somewhat stiff with jaw clenching. Patient was given IV fluids following placement of IVs. Laboratory work was obtained to evaluate for levels of measurable antiepileptics as patient does have a history of seizures. Also evaluate for other infectious or metabolic causes of patient's symptoms exam was nonfocal. Other than opiates and patient's UDS patient's workup not suggestive of a specific etiology for her symptoms. She has remained with low systolics in the ED and still has some signs of lethargy. Patient was discussed with medicine for observation for persistent altered mental status. Possibly due to polypharmacy or sedative ingestion.  Interval hospital note: 1. Acute encephalopathy most likely postictal secondary to seizures - essentially resolved at this time. I have discussed with on-call neurologist Dr. Hosie Poisson who at this time is advised to continue with present antiepileptics but to discontinue Norco and advised not to take it which may be his treating seizures. Patient has known neurologist Dr. Karel Jarvis and patient to follow-up with her neurologist as outpatient for any further titration of  medications. At discharge patient will need to be advised no driving until cleared by neurologist. 2. Anemia - follow CBC.  She was discharged home after 24 hrs observation, 2nd CT head was normal, she did however have a xray that showed a right sided PNA.Dc home without abx, I called in augmenting for her.  She is doing well, she is improved on antibiotics and wants to go back to work . She feels she can breath better , has more appetite, no fevers, no fatigue. She never had seizures on norco which she takes for her RA pain prn . She is not having any seizure sxs and states she doe snot think she did when she went to the ER, she states that usually with a seizure, she doe snot remember anything but she remembered everything that occurred in the EMS.  She has been compliant with all meds   Past Medical History  Diagnosis Date  . GERD (gastroesophageal reflux disease)   . Hyperlipidemia   . History of blood transfusion   . Chronic headaches   . Phlebitis   . History of colonic polyps   . Behcet's disease (HCC)     has port-a-cath  . Tonic clonic seizures (HCC)     Pseudo-Seizure  . Anxiety     "situational stress"  . Kidney stones 4540,9811  . Myocardial infarction (HCC)   . Arthritis     Rheumatoid   Past Surgical History  Procedure Laterality Date  . Cholecystectomy    . Tonsillectomy  1970  . Appendectomy  1975  . Lumbar fusion  1999    L5-S1  . Abdominal hysterectomy  1988  . Portacath placement Left 2008  . Stone extraction with  basket    . Back surgery      L5-S1 ALIF  . Adenoidectomy  at 54 years old  . Oophorectomy  1987  . Radiology with anesthesia N/A 10/24/2014    Procedure: RADIOLOGY WITH ANESTHESIA;  Surgeon: Medication Radiologist, MD;  Location: [facility] OR;  Service: Radiology;  Laterality: N/A;   Social History   Social History  . Marital Status: Widowed    Spouse Name: N/A  . Number of Children: 2  . Years of Education: 14   Occupational History  . Nurse       [employer]  .     Social History Main Topics  . Smoking status: Never Smoker   . Smokeless tobacco: Never Used  . Alcohol Use: No  . Drug Use: No  . Sexual Activity: Yes    Birth Control/ Protection: Surgical   Other Topics Concern  . None   Social History Narrative   Lives with husband.  They have 2 grown children.   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 Diabetes Mother   . Hyperlipidemia Mother   . Stroke Mother   . Hypertension Mother   . Colon cancer Mother   . Breast cancer Mother   . Early death Father   . Heart attack Father   . Heart disease Father   . Hyperlipidemia Father   . COPD Sister   . Heart disease Maternal Grandmother   . Heart disease Maternal Grandfather   . Heart disease Paternal Grandmother   . Heart disease Paternal Grandfather    Allergies  Allergen Reactions  . Hepatitis B Vaccine Hives and Shortness Of Breath    Causes wheezing   . Imitrex [Sumatriptan] Other (See Comments)    Makes her have SVT and chest pain  . Iohexol Anaphylaxis, Hives and Shortness Of Breath  . Contrast Media [Iodinated Diagnostic Agents]   . Codeine Itching and Rash   Prior to Admission medications   Medication Sig Start Date End Date Taking? Authorizing Provider  ALPRAZolam (XANAX) 0.5 MG tablet TAKE 1 TABLET BY MOUTH 3 TIMES A DAY AS NEEDED FOR ANXIETY 08/27/15  Yes Salvatrice Morandi P Marisol Giambra, DO  amoxicillin-clavulanate (AUGMENTIN) 875-125 MG tablet Take 1 tablet by mouth 2 (two) times daily. 10/18/15  Yes Lutricia Widjaja P Caitlyn Buchanan, DO  aspirin 81 MG tablet Take 81 mg by mouth every morning.    Yes Historical Provider, MD  divalproex (DEPAKOTE) 250 MG DR tablet Take 2 tablets 3 times daily Patient taking differently: Take 500 mg by mouth 3 (three) times daily. Take 2 tablets 3 times daily 09/11/15  Yes Van Clines, MD  escitalopram (LEXAPRO) 10 MG tablet Take 1 tablet (10 mg total) by mouth daily. 05/06/15  Yes Elease Etienne, MD  gabapentin (NEURONTIN) 300 MG capsule TAKE 3 CAPSULES (900 MG TOTAL) BY MOUTH 3 (THREE) TIMES DAILY. 08/15/15  Yes Van Clines, MD  hydroxychloroquine (PLAQUENIL) 200 MG tablet Take 1 tablet (200 mg total) by mouth 2 (two) times daily. 05/03/15  Yes Rhyanna Sorce P Ibn Stief, DO  levETIRAcetam (KEPPRA) 500 MG tablet Take 2 tablets 2 times daily Patient taking differently: Take 1,000 mg by mouth 2 (two) times daily. Take 2 tablets 2 times daily 09/11/15  Yes Van Clines, MD  promethazine [facility]) 25  MG tablet TAKE 1 TABLET BY MOUTH EVERY 6 HOURS AS NEEDED FOR NAUSEA AND VOMITING 10/09/15  Yes Randell Teare P Angelissa Supan, DO  topiramate (TOPAMAX) 100 MG tablet Take 1 tablet (100 mg total) by mouth daily. 01/03/15  Yes Van Clines, MD  zolpidem (AMBIEN) 10 MG tablet TAKE 1 TAB BY MOUTH AT BEDTIME AS NEEDED FOR SLEEP 08/16/15  Yes Channon Ambrosini P Anusha Claus, DO     ROS: The patient denies fevers, chills, night sweats, unintentional weight loss, chest pain, palpitations, wheezing, dyspnea on exertion, nausea, vomiting, abdominal pain, dysuria, hematuria, melena, numbness, weakness, or tingling.   All other systems have been reviewed and were otherwise negative with the exception of those mentioned in the HPI and as above.    PHYSICAL EXAM: Filed Vitals:   10/22/15 0928  BP: 136/84  Pulse: 90  Temp: 98.4 F (36.9 C)  Resp: 16   SpO2 Readings from Last 3 Encounters:  10/22/15 96%  10/16/15 98%  10/12/15 97%    Body mass index is 26.86 kg/(m^2).   General: Alert, no acute distress HEENT:  Normocephalic, atraumatic, oropharynx patent. EOMI, PERRLA, TM normal Cardiovascular:  Regular rate and rhythm, no rubs murmurs or gallops.  No Carotid bruits, radial pulse intact. No pedal edema.  Respiratory: Clear to auscultation bilaterally.  No wheezes, rales, or rhonchi.  No cyanosis, no use of accessory  musculature Abdominal: No organomegaly, abdomen is soft and non-tender, positive bowel sounds. No masses. Skin: No rashes. Neurologic: Facial musculature symmetric. Psychiatric: Patient acts appropriately throughout our interaction. Lymphatic: No cervical or submandibular lymphadenopathy Musculoskeletal: Gait intact. No edema, tenderness   LABS: Results for orders placed or performed during the hospital encounter of 10/15/15  CBC with Differential  Result Value Ref Range   WBC 11.0 (H) 4.0 - 10.5 K/uL   RBC 3.78 (L) 3.87 - 5.11 MIL/uL   Hemoglobin 11.2 (L) 12.0 - 15.0 g/dL   HCT 29.5 (L) 62.1 - 30.8 %   MCV 92.1 78.0 - 100.0 fL   MCH 29.6 26.0 - 34.0 pg   MCHC 32.2 30.0 - 36.0 g/dL   RDW 65.7 84.6 - 96.2 %   Platelets 249 150 - 400 K/uL   Neutrophils Relative % 75 %   Neutro Abs 8.3 (H) 1.7 - 7.7 K/uL   Lymphocytes Relative 15 %   Lymphs Abs 1.7 0.7 - 4.0 K/uL   Monocytes Relative 7 %   Monocytes Absolute 0.7 0.1 - 1.0 K/uL   Eosinophils Relative 3 %   Eosinophils Absolute 0.4 0.0 - 0.7 K/uL   Basophils Relative 0 %   Basophils Absolute 0.0 0.0 - 0.1 K/uL  Comprehensive metabolic panel  Result Value Ref Range   Sodium 137 135 - 145 mmol/L   Potassium 4.1 3.5 - 5.1 mmol/L   Chloride 106 101 - 111 mmol/L   CO2 22 22 - 32 mmol/L   Glucose, Bld 112 (H) 65 - 99 mg/dL   BUN 11 6 - 20 mg/dL   Creatinine, Ser 9.52 0.44 - 1.00 mg/dL   Calcium 9.1 8.9 - 84.1 mg/dL   Total Protein 6.7 6.5 - 8.1 g/dL   Albumin 3.3 (L) 3.5 - 5.0 g/dL   AST 21 15 - 41 U/L   ALT 18 14 - 54 U/L   Alkaline Phosphatase 73 38 - 126 U/L   Total Bilirubin 0.6 0.3 - 1.2 mg/dL   GFR calc non Af Amer >60 >60 mL/min   GFR calc Af Amer >60 >60 mL/min  Anion gap 9 5 - 15  Valproic acid level  Result Value Ref Range   Valproic Acid Lvl 64 50.0 - 100.0 ug/mL  Magnesium  Result Value Ref Range   Magnesium 1.9 1.7 - 2.4 mg/dL  Phosphorus  Result Value Ref Range   Phosphorus 3.1 2.5 - 4.6 mg/dL   Urinalysis with microscopic  Result Value Ref Range   Color, Urine YELLOW YELLOW   APPearance CLOUDY (A) CLEAR   Specific Gravity, Urine 1.018 1.005 - 1.030   pH 8.5 (H) 5.0 - 8.0   Glucose, UA NEGATIVE NEGATIVE mg/dL   Hgb urine dipstick NEGATIVE NEGATIVE   Bilirubin Urine NEGATIVE NEGATIVE   Ketones, ur 15 (A) NEGATIVE mg/dL   Protein, ur NEGATIVE NEGATIVE mg/dL   Urobilinogen, UA 1.0 0.0 - 1.0 mg/dL   Nitrite NEGATIVE NEGATIVE   Leukocytes, UA SMALL (A) NEGATIVE   WBC, UA 7-10 <3 WBC/hpf   Squamous Epithelial / LPF MANY (A) RARE   Urine-Other AMORPHOUS URATES/PHOSPHATES   Blood gas, venous  Result Value Ref Range   FIO2 TEST WILL BE CREDITED    O2 Content TEST WILL BE CREDITED L/min   Delivery systems TEST WILL BE CREDITED    Mode TEST WILL BE CREDITED    VT TEST WILL BE CREDITED mL   LHR TEST WILL BE CREDITED resp/min   Hi Frequency JET Vent Rate TEST WILL BE CREDITED    Peep/cpap TEST WILL BE CREDITED cm H20   PIP TEST WILL BE CREDITED cm H2O   Hi Frequency JET Vent PIP TEST WILL BE CREDITED    Pressure support TEST WILL BE CREDITED cm H20   Pressure control TEST WILL BE CREDITED cm H20   pH, Ven TEST WILL BE CREDITED 7.250 - 7.300   pCO2, Ven TEST WILL BE CREDITED 45.0 - 50.0 mmHg   pO2, Ven TEST WILL BE CREDITED 30.0 - 45.0 mmHg   Bicarbonate TEST WILL BE CREDITED 20.0 - 24.0 mEq/L   TCO2 TEST WILL BE CREDITED 0 - 100 mmol/L   Acid-Base Excess TEST WILL BE CREDITED 0.0 - 2.0 mmol/L   Acid-base deficit TEST WILL BE CREDITED 0.0 - 2.0 mmol/L   O2 Saturation TEST WILL BE CREDITED %   Patient temperature TEST WILL BE CREDITED    Amplitude TEST WILL BE CREDITED    Map TEST WILL BE CREDITED cmH20   Hertz TEST WILL BE CREDITED    Nitric Oxide TEST WILL BE CREDITED    Collection site TEST WILL BE CREDITED    Drawn by TEST WILL BE CREDITED    Sample type TEST WILL BE CREDITED    Inspiratory PAP TEST WILL BE CREDITED    Expiratory PAP TEST WILL BE CREDITED     Mechanical Rate TEST WILL BE CREDITED   Urine rapid drug screen (hosp performed)  Result Value Ref Range   Opiates POSITIVE (A) NONE DETECTED   Cocaine NONE DETECTED NONE DETECTED   Benzodiazepines POSITIVE (A) NONE DETECTED   Amphetamines NONE DETECTED NONE DETECTED   Tetrahydrocannabinol NONE DETECTED NONE DETECTED   Barbiturates NONE DETECTED NONE DETECTED  Ethanol  Result Value Ref Range   Alcohol, Ethyl (B) <5 <5 mg/dL  Salicylate level  Result Value Ref Range   Salicylate Lvl <4.0 2.8 - 30.0 mg/dL  Comprehensive metabolic panel  Result Value Ref Range   Sodium 135 135 - 145 mmol/L   Potassium 4.0 3.5 - 5.1 mmol/L   Chloride 106 101 - 111 mmol/L  CO2 23 22 - 32 mmol/L   Glucose, Bld 95 65 - 99 mg/dL   BUN 7 6 - 20 mg/dL   Creatinine, Ser 6.210.75 0.44 - 1.00 mg/dL   Calcium 9.3 8.9 - 30.810.3 mg/dL   Total Protein 6.3 (L) 6.5 - 8.1 g/dL   Albumin 3.0 (L) 3.5 - 5.0 g/dL   AST 18 15 - 41 U/L   ALT 16 14 - 54 U/L   Alkaline Phosphatase 64 38 - 126 U/L   Total Bilirubin 0.6 0.3 - 1.2 mg/dL   GFR calc non Af Amer >60 >60 mL/min   GFR calc Af Amer >60 >60 mL/min   Anion gap 6 5 - 15  CBC WITH DIFFERENTIAL  Result Value Ref Range   WBC 9.0 4.0 - 10.5 K/uL   RBC 3.62 (L) 3.87 - 5.11 MIL/uL   Hemoglobin 11.0 (L) 12.0 - 15.0 g/dL   HCT 65.733.6 (L) 84.636.0 - 96.246.0 %   MCV 92.8 78.0 - 100.0 fL   MCH 30.4 26.0 - 34.0 pg   MCHC 32.7 30.0 - 36.0 g/dL   RDW 95.213.2 84.111.5 - 32.415.5 %   Platelets 247 150 - 400 K/uL   Neutrophils Relative % 72 %   Neutro Abs 6.5 1.7 - 7.7 K/uL   Lymphocytes Relative 17 %   Lymphs Abs 1.5 0.7 - 4.0 K/uL   Monocytes Relative 7 %   Monocytes Absolute 0.6 0.1 - 1.0 K/uL   Eosinophils Relative 4 %   Eosinophils Absolute 0.3 0.0 - 0.7 K/uL   Basophils Relative 0 %   Basophils Absolute 0.0 0.0 - 0.1 K/uL  I-Stat venous blood gas, ED  Result Value Ref Range   pH, Ven 7.453 (H) 7.250 - 7.300   pCO2, Ven 30.9 (L) 45.0 - 50.0 mmHg   pO2, Ven 74.0 (H) 30.0 - 45.0 mmHg    Bicarbonate 21.6 20.0 - 24.0 mEq/L   TCO2 23 0 - 100 mmol/L   O2 Saturation 96.0 %   Acid-base deficit 2.0 0.0 - 2.0 mmol/L   Patient temperature HIDE    Sample type VENOUS   I-Stat Beta hCG blood, ED ([facility], [facility], [facility] only)  Result Value Ref Range   I-stat hCG, quantitative <5.0 <5 mIU/mL   Comment 3          CBG monitoring, ED  Result Value Ref Range   Glucose-Capillary 75 65 - 99 mg/dL  Sample to Blood Bank  Result Value Ref Range   Blood Bank Specimen SAMPLE AVAILABLE FOR TESTING    Sample Expiration 10/16/2015      EKG/XRAY:   Primary [facility]   ASSESSMENT/PLAN: Encounter Diagnoses  Name Primary?  Marland Kitchen. Hospital discharge follow-up   . Behcet's disease (HCC)   . Bereavement   . Chronic nonintractable headache, unspecified headache type   . Pneumonia involving right lung, unspecified part of lung Yes    I will release Burna MortimerWanda back to work, she willnot drive until she sees Dr Karel JarvisAquino to discuss what happened.  She will continue with abx for PNA, she is improving Fu in 3-4 weeks for repeat Chest xray  She would like to get off her meds, her norco and also her benzos but would like to taper, I have advised to talk to Dr Karel JarvisAquino about her latest hospitalization, if all is well then we can start taper.    Gross sideeffects, risk and benefits, and alternatives of medications d/w patient. Patient is aware  that all medications have potential sideeffects and we are unable to predict every sideeffect or drug-drug interaction that may occur.  Ezechiel Stooksbury DO  10/25/2015 5:28 PM

## 2015-10-23 ENCOUNTER — Other Ambulatory Visit: Payer: Self-pay | Admitting: Family Medicine

## 2015-10-23 MED ORDER — DIVALPROEX SODIUM 250 MG PO DR TAB: DELAYED_RELEASE_TABLET | ORAL | Status: DC

## 2015-10-23 MED ORDER — LEVETIRACETAM 500 MG PO TABS: ORAL_TABLET | ORAL | Status: DC

## 2015-10-23 NOTE — Telephone Encounter (Signed)
Fax refill request from pharmacy

## 2015-10-24 ENCOUNTER — Telehealth: Payer: Self-pay | Admitting: Family Medicine

## 2015-10-24 NOTE — Telephone Encounter (Signed)
Spoke with patient and [facility].  She will call me with the appointment date and time so that I can pull the report when it is done.

## 2015-10-25 ENCOUNTER — Encounter: Payer: Self-pay | Admitting: Family Medicine

## 2015-10-28 ENCOUNTER — Ambulatory Visit (INDEPENDENT_AMBULATORY_CARE_PROVIDER_SITE_OTHER): Payer: Managed Care, Other (non HMO) | Admitting: Neurology

## 2015-10-28 ENCOUNTER — Encounter: Payer: Self-pay | Admitting: Neurology

## 2015-10-28 VITALS — BP 140/88 | HR 110 | Ht 69.0 in | Wt 187.0 lb

## 2015-10-28 DIAGNOSIS — G40219 Localization-related (focal) (partial) symptomatic epilepsy and epileptic syndromes with complex partial seizures, intractable, without status epilepticus: Secondary | ICD-10-CM

## 2015-10-28 DIAGNOSIS — M352 Behcet's disease: Secondary | ICD-10-CM
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
## 2015-10-28 NOTE — Patient Instructions (Signed)
1. Continue all your seizure medications 2. You may start tapering Xanax, but would do slowly 3. As per [place] driving laws, after an episode of loss of consciousness/awareness, one should not drive until 6 months event-free 4. Follow-up in 4 months  Seizure Precautions: 1. If medication has been prescribed for you to prevent seizures, take it exactly as directed.  Do not stop taking the medicine without talking to your doctor first, even if you have not had a seizure in a long time.   2. Avoid activities in which a seizure would cause danger to yourself or to others.  Don't operate dangerous machinery, swim alone, or climb in high or dangerous places, such as on ladders, roofs, or girders.  Do not drive unless your doctor says you may.  3. If you have any warning that you may have a seizure, lay down in a safe place where you can't hurt yourself.    4.  No driving for 6 months from last seizure, [place].   Please refer to the following link on the Epilepsy Foundation of America's website for more information: http://www.epilepsyfoundation.org/answerplace/Social/driving/drivingu.cfm   5.  Maintain good sleep hygiene. Avoid alcohol.  6.  Contact your doctor if you have any problems that may be related to the medicine you are taking.  7.  Call 911 and bring the patient back to the ED if:        A.  The seizure lasts longer than 5 minutes.       B.  The patient doesn't awaken shortly after the seizure  C.  The patient has new problems such as difficulty seeing, speaking or moving  D.  The patient was injured during the seizure  E.  The patient has a temperature over 102 F (39C)  F.  The patient vomited and now is having trouble breathing

## 2015-10-29 ENCOUNTER — Encounter: Payer: Self-pay | Admitting: Family Medicine

## 2015-10-30 ENCOUNTER — Other Ambulatory Visit: Payer: Self-pay | Admitting: Family Medicine

## 2015-10-30 ENCOUNTER — Telehealth: Payer: Self-pay | Admitting: Family Medicine

## 2015-10-30 NOTE — Telephone Encounter (Signed)
Lm to go to ER or urgent care, make rec that ER is probably better since hx of NSTEMI, need to rule out MI, CHF, PE

## 2015-11-01 DIAGNOSIS — G40219 Localization-related (focal) (partial) symptomatic epilepsy and epileptic syndromes with complex partial seizures, intractable, without status epilepticus: Secondary | ICD-10-CM | POA: Insufficient documentation

## 2015-11-03 ENCOUNTER — Other Ambulatory Visit: Payer: Self-pay | Admitting: Family Medicine

## 2015-11-06 ENCOUNTER — Encounter: Payer: Self-pay | Admitting: Family Medicine

## 2015-11-06 ENCOUNTER — Other Ambulatory Visit: Payer: Self-pay | Admitting: Family Medicine

## 2015-11-09 ENCOUNTER — Encounter: Payer: Self-pay | Admitting: Family Medicine

## 2015-11-10 ENCOUNTER — Other Ambulatory Visit: Payer: Self-pay | Admitting: Family Medicine

## 2015-11-10 ENCOUNTER — Encounter: Payer: Self-pay | Admitting: Family Medicine

## 2015-11-10 ENCOUNTER — Other Ambulatory Visit: Payer: Self-pay | Admitting: Neurology

## 2015-11-10 MED ORDER — TOPIRAMATE 100 MG PO TABS: 100.0000 mg | ORAL_TABLET | Freq: Every day | ORAL | Status: AC

## 2015-11-10 MED ORDER — LEVETIRACETAM 500 MG PO TABS: ORAL_TABLET | ORAL | Status: AC

## 2015-11-10 MED ORDER — DIVALPROEX SODIUM 250 MG PO DR TAB: DELAYED_RELEASE_TABLET | ORAL | Status: AC

## 2015-11-11 ENCOUNTER — Encounter: Payer: Self-pay | Admitting: Neurology

## 2015-11-12 ENCOUNTER — Encounter: Payer: Self-pay | Admitting: Cardiovascular Disease

## 2015-11-12 ENCOUNTER — Ambulatory Visit (INDEPENDENT_AMBULATORY_CARE_PROVIDER_SITE_OTHER): Payer: Managed Care, Other (non HMO) | Admitting: Cardiovascular Disease

## 2015-11-12 VITALS — BP 124/70 | HR 69 | Ht 69.0 in | Wt 187.0 lb

## 2015-11-12 DIAGNOSIS — I471 Supraventricular tachycardia: Secondary | ICD-10-CM | POA: Diagnosis not present

## 2015-11-12 DIAGNOSIS — E785 Hyperlipidemia, unspecified: Secondary | ICD-10-CM | POA: Diagnosis not present

## 2015-11-12 MED ORDER — ATORVASTATIN CALCIUM 40 MG PO TABS: 40.0000 mg | ORAL_TABLET | Freq: Every day | ORAL | Status: AC

## 2015-11-12 MED ORDER — PROPRANOLOL HCL 10 MG PO TABS: 10.0000 mg | ORAL_TABLET | Freq: Four times a day (QID) | ORAL | Status: AC | PRN

## 2015-11-12 NOTE — Progress Notes (Addendum)
Cardiology Office Note   Date:  11/12/2015   ID:  Brenda Mcguire, DOB 11-13-1961, MRN [id number]  PCP:  Rockne Coons, DO  Cardiologist:   Vesta Mixer, MD   Chief Complaint  Patient presents with  . Palpitations    problem list 1. Hyperlipidemia 2. Supraventricular tachycardia 3. Behcet's syndrome    History of Present Illness: Brenda Mcguire is a 54 y.o. female who presents for evaluation of episodes of tachypalpitations .  The palpitations occur spontaneously .   Associated with dyspnea.  Last from several minutes - several hours.  They resolve spontaneously . Not associated with chest pain or dizziness.   Clinically sounds like SVT.   Does not get any regular exercise  Non smoker   + family hx of CAD Father died at 7 of MI Grandfather died at 22 Mother had first MI at age 21.  Multiple aunts and uncles have had CAD   Past Medical History  Diagnosis Date  . GERD (gastroesophageal reflux disease)   . Hyperlipidemia   . History of blood transfusion   . Chronic headaches   . Phlebitis   . History of colonic polyps   . Behcet's disease (HCC)     has port-a-cath  . Tonic clonic seizures (HCC)     Pseudo-Seizure  . Anxiety     "situational stress"  . Kidney stones 4098,1191  . Myocardial infarction (HCC)   . Arthritis     Rheumatoid    Past Surgical History  Procedure Laterality Date  . Cholecystectomy    . Tonsillectomy  1970  . Appendectomy  1975  . Lumbar fusion  1999    L5-S1  . Abdominal hysterectomy  1988  . Portacath placement Left 2008  . Stone extraction with basket    . Back surgery      L5-S1 ALIF  . Adenoidectomy  at 54 years old  . Oophorectomy  1987  . Radiology with anesthesia N/A 10/24/2014    Procedure: RADIOLOGY WITH ANESTHESIA;  Surgeon: Medication Radiologist, MD;  Location: [facility] OR;  Service: Radiology;  Laterality: N/A;     Current Outpatient Prescriptions  Medication Sig Dispense Refill  . ALPRAZolam (XANAX) 0.5  MG tablet TAKE 1 TABLET BY MOUTH 3 TIMES A DAY AS NEEDED FOR ANXIETY 90 tablet 1  . aspirin 81 MG tablet Take 81 mg by mouth every morning.     . divalproex (DEPAKOTE) 250 MG DR tablet Take 2 tablets 3 times daily 540 tablet 0  . escitalopram (LEXAPRO) 10 MG tablet Take 1 tablet (10 mg total) by mouth daily. 30 tablet 0  . gabapentin (NEURONTIN) 300 MG capsule TAKE 3 CAPSULES (900 MG TOTAL) BY MOUTH 3 (THREE) TIMES DAILY. 270 capsule 2  . hydroxychloroquine (PLAQUENIL) 200 MG tablet Take 1 tablet (200 mg total) by mouth 2 (two) times daily. 60 tablet 5  . levETIRAcetam (KEPPRA) 500 MG tablet Take 2 tablets 2 times daily 360 tablet 0  . topiramate (TOPAMAX) 100 MG tablet Take 1 tablet (100 mg total) by mouth daily. 30 tablet 6  . zolpidem (AMBIEN) 10 MG tablet TAKE 1 TAB BY MOUTH AT BEDTIME AS NEEDED FOR SLEEP 30 tablet 2   No current facility-administered medications for this visit.    Allergies:   Hepatitis b vaccine; Imitrex; Iohexol; Contrast media; and Codeine    Social History:  The patient  reports that she has never smoked. She has never used smokeless tobacco. She reports that she does not  drink alcohol or use illicit drugs.   Family History:  The patient's family history includes Breast cancer in her mother; COPD in her sister; Colon cancer in her mother; Diabetes in her mother; Early death in her father; Heart attack in her father; Heart disease in her father, maternal grandfather, maternal grandmother, paternal grandfather, and paternal grandmother; Hyperlipidemia in her father and mother; Hypertension in her mother; Stroke in her mother.    ROS:  Please see the history of present illness.    Review of Systems: Constitutional:  denies fever, chills, diaphoresis, appetite change and fatigue.  HEENT: denies photophobia, eye pain, redness, hearing loss, ear pain, congestion, sore throat, rhinorrhea, sneezing, neck pain, neck stiffness and tinnitus.  Respiratory: denies SOB, DOE,  cough, chest tightness, and wheezing.  Cardiovascular: denies chest pain, palpitations and leg swelling.  Gastrointestinal: denies nausea, vomiting, abdominal pain, diarrhea, constipation, blood in stool.  Genitourinary: denies dysuria, urgency, frequency, hematuria, flank pain and difficulty urinating.  Musculoskeletal: denies  myalgias, back pain, joint swelling, arthralgias and gait problem.   Skin: denies pallor, rash and wound.  Neurological: denies dizziness, seizures, syncope, weakness, light-headedness, numbness and headaches.   Hematological: denies adenopathy, easy bruising, personal or family bleeding history.  Psychiatric/ Behavioral: denies suicidal ideation, mood changes, confusion, nervousness, sleep disturbance and agitation.       All other systems are reviewed and negative.    PHYSICAL EXAM: VS:  BP 124/70 mmHg  Pulse 69  Ht  (1.753 m)  Wt 187 lb (84.823 kg)  BMI 27.60 kg/m2  SpO2 98% , BMI Body mass index is 27.6 kg/(m^2). GEN: Well nourished, well developed, in no acute distress HEENT: normal Neck: no JVD, carotid bruits, or masses Cardiac: RRR; no murmurs, rubs, or gallops,no edema  Respiratory:  clear to auscultation bilaterally, normal work of breathing GI: soft, nontender, nondistended, + BS MS: no deformity or atrophy Skin: warm and dry, no rash Neuro:  Strength and sensation are intact Psych: normal   EKG:  EKG is not ordered today. The ekg ordered 10/16/15  demonstrates  Sinus rhythm.  NS ST abn.    Recent Labs: 10/12/2015: TSH 0.589 10/15/2015: Magnesium 1.9 10/16/2015: ALT 16; BUN 7; Creatinine, Ser 0.75; Hemoglobin 11.0*; Platelets 247; Potassium 4.0; Sodium 135    Lipid Panel    Component Value Date/Time   CHOL 234* 03/02/2013 1115   TRIG 102 01/16/2015 0420   HDL 45.90 03/02/2013 1115   CHOLHDL 5 03/02/2013 1115   VLDL 16.6 03/02/2013 1115   LDLDIRECT 186.4 03/02/2013 1115      Wt Readings from Last 3 Encounters:  11/12/15  187 lb (84.823 kg)  10/28/15 187 lb (84.823 kg)  10/22/15 187 lb 3.2 oz (84.913 kg)      Other studies Reviewed: Additional studies/ records that were reviewed today include: . Review of the above records demonstrates:    ASSESSMENT AND PLAN:  1.   Superventricular tachycardia: The patient presents with palpitations that clinically sound like SVT. I've started her on the Valsalva maneuver and also extenuation of the diving reflex.   We'll give her propanolol to take on an as-needed basis. See her in 3 months and we'll reassess. She does not want where a monitor at this time.   2. Hyperlipidemia: The patient has a history of hyperlipidemia. Last LDL was from 2014  and was 186. She has a very strong family history of coronary artery disease with both parents having heart attacks before age 12.  We'll start  her on atorvastatin 40 mg a day. We'll check fasting lipids, liver enzymes, basic medical profile in 3 months.   Current medicines are reviewed at length with the patient today.  The patient does not have concerns regarding medicines.  The following changes have been made:  no change  Labs/ tests ordered today include:  No orders of the defined types were placed in this encounter.     Disposition:   FU with me in 3 months .      Alvis Pulcini, Deloris PingPhilip J, MD  11/12/2015 4:10 PM    [facility] [address], [place], [place]  [address] Phone: [phone]; Fax: [phone]   [facility]  [address] [place], [place]  [address] [phone]   Fax [phone]

## 2015-11-12 NOTE — Patient Instructions (Signed)
Medication Instructions:  START Atorvastatin 40 mg once daily START Propranolol 10 mg up to 4 times per day as needed for fast heart rate   Labwork: TODAY - cholesterol, liver, basic metabolic panel  Your physician recommends that you return for lab work in: 3 months on the day of or a few days before your office visit with Dr. Elease HashimotoNahser.  You will need to FAST for this appointment - nothing to eat or drink after midnight the night before except water.   Testing/Procedures: None Ordered   Follow-Up: Your physician recommends that you schedule a follow-up appointment in: 3 months with Dr. Elease HashimotoNahser   If you need a refill on your cardiac medications before your next appointment, please call your pharmacy.   Thank you for choosing [facility]! Eligha BridegroomMichelle Swinyer, RN [phone]

## 2015-11-13 LAB — COMPREHENSIVE METABOLIC PANEL
ALT: 17 U/L (ref 6–29)
AST: 20 U/L (ref 10–35)
Albumin: 4.5 g/dL (ref 3.6–5.1)
Alkaline Phosphatase: 90 U/L (ref 33–130)
BUN: 16 mg/dL (ref 7–25)
CHLORIDE: 103 mmol/L (ref 98–110)
CO2: 24 mmol/L (ref 20–31)
CREATININE: 0.79 mg/dL (ref 0.50–1.05)
Calcium: 10 mg/dL (ref 8.6–10.4)
GLUCOSE: 105 mg/dL — AB (ref 65–99)
POTASSIUM: 4.2 mmol/L (ref 3.5–5.3)
SODIUM: 139 mmol/L (ref 135–146)
TOTAL PROTEIN: 8.4 g/dL — AB (ref 6.1–8.1)
Total Bilirubin: 0.4 mg/dL (ref 0.2–1.2)

## 2015-11-13 LAB — LIPID PANEL
Cholesterol: 266 mg/dL — ABNORMAL HIGH (ref 125–200)
HDL: 51 mg/dL (ref >=46)
LDL CALC: 198 mg/dL — AB (ref <130)
TRIGLYCERIDES: 84 mg/dL (ref <150)
Total CHOL/HDL Ratio: 5.2 Ratio — ABNORMAL HIGH (ref <=5.0)
VLDL: 17 mg/dL (ref <30)

## 2015-11-21 ENCOUNTER — Encounter: Payer: Self-pay | Admitting: Family Medicine

## 2015-11-21 ENCOUNTER — Other Ambulatory Visit: Payer: Self-pay | Admitting: Family Medicine

## 2015-11-29 ENCOUNTER — Other Ambulatory Visit: Payer: Self-pay | Admitting: Family Medicine

## 2015-12-07 ENCOUNTER — Other Ambulatory Visit: Payer: Self-pay | Admitting: Family Medicine

## 2015-12-10 ENCOUNTER — Other Ambulatory Visit: Payer: Self-pay | Admitting: Family Medicine

## 2015-12-14 ENCOUNTER — Other Ambulatory Visit: Payer: Self-pay | Admitting: Family Medicine

## 2015-12-16 ENCOUNTER — Other Ambulatory Visit: Payer: Self-pay | Admitting: Family Medicine

## 2015-12-16 MED ORDER — ZOLPIDEM TARTRATE 10 MG PO TABS: ORAL_TABLET | ORAL | Status: DC

## 2015-12-16 NOTE — Telephone Encounter (Signed)
Zolpidem called into [facility].

## 2016-01-01 ENCOUNTER — Encounter: Payer: Self-pay | Admitting: Family Medicine

## 2016-01-08 ENCOUNTER — Other Ambulatory Visit: Payer: Self-pay | Admitting: Neurology

## 2016-01-08 NOTE — Telephone Encounter (Signed)
Do you have her on this? I didn't see it on her med list or in her last ov.

## 2016-01-09 NOTE — Telephone Encounter (Signed)
I don't see any notes that I have filled this for her. Maybe her PCP did this? Pls let her know to ask PCP. Thanks

## 2016-01-11 ENCOUNTER — Other Ambulatory Visit: Payer: Self-pay | Admitting: Family Medicine

## 2016-01-11 ENCOUNTER — Other Ambulatory Visit: Payer: Self-pay | Admitting: Neurology

## 2016-01-13 ENCOUNTER — Other Ambulatory Visit: Payer: Self-pay | Admitting: Family Medicine

## 2016-01-13 ENCOUNTER — Other Ambulatory Visit: Payer: Self-pay | Admitting: Neurology

## 2016-01-13 ENCOUNTER — Encounter: Payer: Self-pay | Admitting: Neurology

## 2016-01-13 ENCOUNTER — Encounter: Payer: Self-pay | Admitting: Family Medicine

## 2016-01-13 NOTE — Telephone Encounter (Signed)
Dr Conley Rolls, I saw the pt email where pt requested this and alprazolam. You did already RF the alprazolam, but I didn't see documentation as to whether it got called or faxed in over the weekend. I called it in just now to make sure pharm has it. Please advise on phenergan RF.

## 2016-01-15 ENCOUNTER — Other Ambulatory Visit: Payer: Self-pay | Admitting: Family Medicine

## 2016-01-15 MED ORDER — PREDNISONE 10 MG PO TABS: ORAL_TABLET | ORAL | Status: DC

## 2016-01-16 ENCOUNTER — Encounter: Payer: Self-pay | Admitting: Family Medicine

## 2016-01-16 ENCOUNTER — Encounter: Payer: Self-pay | Admitting: Neurology

## 2016-01-17 ENCOUNTER — Encounter: Payer: Self-pay | Admitting: Family Medicine

## 2016-01-19 ENCOUNTER — Encounter: Payer: Self-pay | Admitting: Neurology

## 2016-01-20 ENCOUNTER — Other Ambulatory Visit: Payer: Self-pay | Admitting: Family Medicine

## 2016-01-20 ENCOUNTER — Encounter: Payer: Self-pay | Admitting: Neurology

## 2016-01-20 DIAGNOSIS — Z1231 Encounter for screening mammogram for malignant neoplasm of breast: Secondary | ICD-10-CM

## 2016-01-21 ENCOUNTER — Encounter: Payer: Self-pay | Admitting: Neurology

## 2016-01-24 ENCOUNTER — Telehealth: Payer: Self-pay

## 2016-01-24 NOTE — Telephone Encounter (Signed)
PA started for zolpidem on covermymeds. Waiting for clinical ?s to load.

## 2016-01-27 NOTE — Telephone Encounter (Signed)
Got notice back from [employer] that they do not handle pt's PAs. [employer] DOES manage the FEP PAs this year however. Called pharm and they verified that the [employer] FEP ins is the one they have been getting coverage through. [employer] and they could not find the pt. Called pt and she verified that [employer] FEP is her primary ins, and Monia Pouch is secondary. She does not know why they can't find her, but will call ins and make sure they have her in the system and call me back if they need anything else.

## 2016-01-27 NOTE — Telephone Encounter (Signed)
Completed PA. Pending. 

## 2016-01-30 ENCOUNTER — Ambulatory Visit (INDEPENDENT_AMBULATORY_CARE_PROVIDER_SITE_OTHER): Payer: Federal, State, Local not specified - PPO | Admitting: Family Medicine

## 2016-01-30 ENCOUNTER — Encounter: Payer: Self-pay | Admitting: Family Medicine

## 2016-01-30 VITALS — BP 130/90 | HR 118 | Temp 99.3°F | Resp 20 | Ht 67.72 in | Wt 182.8 lb

## 2016-01-30 DIAGNOSIS — H9202 Otalgia, left ear: Secondary | ICD-10-CM | POA: Diagnosis not present

## 2016-01-30 DIAGNOSIS — H60392 Other infective otitis externa, left ear: Secondary | ICD-10-CM | POA: Diagnosis not present

## 2016-01-30 DIAGNOSIS — H6592 Unspecified nonsuppurative otitis media, left ear: Secondary | ICD-10-CM | POA: Diagnosis not present

## 2016-01-30 DIAGNOSIS — R000 Tachycardia, unspecified: Secondary | ICD-10-CM | POA: Diagnosis not present

## 2016-01-30 DIAGNOSIS — J0100 Acute maxillary sinusitis, unspecified: Secondary | ICD-10-CM | POA: Diagnosis not present

## 2016-01-30 MED ORDER — CIPROFLOXACIN-DEXAMETHASONE 0.3-0.1 % OT SUSP: 4.0000 [drp] | Freq: Two times a day (BID) | OTIC | Status: DC

## 2016-01-30 MED ORDER — AMOXICILLIN-POT CLAVULANATE 875-125 MG PO TABS
1.0000 | ORAL_TABLET | Freq: Two times a day (BID) | ORAL | Status: DC
Start: 2016-01-30 — End: 2016-05-03

## 2016-01-30 NOTE — Progress Notes (Signed)
 Chief Complaint:  Chief Complaint  Patient presents with  . Sinusitis    yesterday   . Ear Pain    HPI: Brenda Mcguire is a 55 y.o. female who reports to [facility] today complaining of  Left ear pain and sinus sxs. Facial pain.   She has left ear paina dn jaw pain. She is utd on her vaccines. She has been on tylenol.  She has sinus congestion, no cough    Past Medical History  Diagnosis Date  . GERD (gastroesophageal reflux disease)   . Hyperlipidemia   . History of blood transfusion   . Chronic headaches   . Phlebitis   . History of colonic polyps   . Behcet's disease ([place])     has port-a-cath  . Tonic clonic seizures (HCC)     Pseudo-Seizure  . Anxiety     "situational stress"  . Kidney stones 5277,8242  . Myocardial infarction ([place])   . Arthritis     Rheumatoid   Past Surgical History  Procedure Laterality Date  . Cholecystectomy    . Tonsillectomy  1970  . Appendectomy  1975  . Lumbar fusion  1999    L5-S1  . Abdominal hysterectomy  1988  . Portacath placement Left 2008  . Stone extraction with basket    . Back surgery      L5-S1 ALIF  . Adenoidectomy  at 55 years old  . Oophorectomy  1987  . Radiology with anesthesia N/A 10/24/2014    Procedure: RADIOLOGY WITH ANESTHESIA;  Surgeon: Medication Radiologist, MD;  Location: [place];  Service: Radiology;  Laterality: N/A;   Social History   Social History  . Marital Status: Widowed    Spouse Name: N/A  . Number of Children: 2  . Years of Education: 14   Occupational History  . Nurse     [employer]  .     Social History Main Topics  . Smoking status: Never Smoker   . Smokeless tobacco: Never Used  . Alcohol Use: No  . Drug Use: No  . Sexual Activity: Yes    Birth Control/ Protection: Surgical   Other Topics Concern  . None   Social History Narrative   Lives with husband.  They have 2 grown children.   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 Diabetes Mother   . Hyperlipidemia Mother   . Stroke Mother   . Hypertension Mother   . Colon cancer Mother   . Breast cancer Mother   . Early death Father   . Heart attack Father   . Heart disease Father   . Hyperlipidemia Father   . COPD Sister   . Heart disease Maternal Grandmother   . Heart disease Maternal Grandfather   . Heart disease Paternal Grandmother   . Heart disease Paternal Grandfather    Allergies  Allergen Reactions  . Hepatitis B Vaccine Hives and Shortness Of Breath    Causes wheezing   . Imitrex [Sumatriptan] Other (See Comments)    Makes her have SVT and chest pain  . Iohexol Anaphylaxis, Hives and Shortness Of Breath  . Contrast Media [Iodinated Diagnostic Agents]   . Codeine Itching and Rash   Prior to Admission medications   Medication Sig Start Date End  Date Taking? Authorizing Provider  ALPRAZolam (XANAX) 0.5 MG tablet TAKE 1 TABLET BY MOUTH 3 TIMES A DAY AS NEEDED FOR ANXIETY 01/12/16  Yes  P , DO  amitriptyline (ELAVIL) 25 MG tablet TAKE 1 TABLET (25 MG TOTAL) BY MOUTH AT BEDTIME. 01/14/16  Yes Cameron Sprang, MD  aspirin 81 MG tablet Take 81 mg by mouth every morning.    Yes Historical Provider, MD  atorvastatin (LIPITOR) 40 MG tablet Take 1 tablet (40 mg total) by mouth daily. 11/12/15  Yes Thayer Headings, MD  divalproex (DEPAKOTE) 250 MG DR tablet Take 2 tablets 3 times daily 11/10/15  Yes  P , DO  gabapentin (NEURONTIN) 300 MG capsule TAKE 3 CAPSULES (900 MG TOTAL) BY MOUTH 3 (THREE) TIMES DAILY. 11/11/15  Yes Cameron Sprang, MD  hydroxychloroquine (PLAQUENIL) 200 MG tablet Take 1 tablet (200 mg total) by mouth 2 (two) times daily. 05/03/15  Yes  P , DO  levETIRAcetam (KEPPRA) 500 MG tablet Take 2 tablets 2 times daily 11/10/15  Yes  P , DO  promethazine (PHENERGAN) 25 MG tablet TAKE 1  TABLET BY MOUTH EVERY 6 HOURS AS NEEDED FOR NAUSEA AND VOMITING 01/13/16  Yes  P , DO  propranolol (INDERAL) 10 MG tablet Take 1 tablet (10 mg total) by mouth 4 (four) times daily as needed. 11/12/15  Yes Thayer Headings, MD  topiramate (TOPAMAX) 100 MG tablet Take 1 tablet (100 mg total) by mouth daily. 11/10/15  Yes  P , DO  escitalopram (LEXAPRO) 10 MG tablet Take 1 tablet (10 mg total) by mouth daily. Patient not taking: Reported on 01/30/2016 05/06/15   Modena Jansky, MD  predniSONE (DELTASONE) 10 MG tablet Take as directed Patient not taking: Reported on 01/30/2016 01/15/16    P , DO  zolpidem (AMBIEN) 10 MG tablet TAKE 1 TAB BY MOUTH AT BEDTIME AS NEEDED FOR SLEEP Patient not taking: Reported on 01/30/2016 12/16/15    P , DO     ROS: The patient denies fevers, chills, night sweats, unintentional weight loss, chest pain, palpitations, wheezing, dyspnea on exertion, nausea, vomiting, abdominal pain, dysuria, hematuria, melena, numbness, weakness, or tingling.   All other systems have been reviewed and were otherwise negative with the exception of those mentioned in the HPI and as above.    PHYSICAL EXAM: Filed Vitals:   01/30/16 1513  BP: 130/90  Pulse: 118  Temp: 99.3 F (37.4 C)  Resp: 20   Body mass index is 28.03 kg/(m^2).   General: Alert, no acute distress HEENT:  Normocephalic, atraumatic, oropharynx patent. EOMI, PERRLA ft TM and exrtrenal canal erythematous, Tender, + jaw aswellign, + max sinus tenderenss Cardiovascular:  Sinus tach , no rubs murmurs or gallops.  No Carotid bruits, radial pulse intact. No pedal edema.  Respiratory: Clear to auscultation bilaterally.  No wheezes, rales, or rhonchi.  No cyanosis, no use of accessory musculature Abdominal: No organomegaly, abdomen is soft and non-tender, positive bowel sounds. No masses. Skin: No rashes. Neurologic: Facial musculature symmetric. Psychiatric: Patient acts appropriately throughout our  interaction. Lymphatic: No cervical or submandibular lymphadenopathy Musculoskeletal: Gait intact. No edema, tenderness   LABS: Results for orders placed or performed in visit on 11/12/15  Lipid Profile  Result Value Ref Range   Cholesterol 266 (H) 125 - 200 mg/dL   Triglycerides 84 <150 mg/dL   HDL 51 >=46 mg/dL   Total CHOL/HDL Ratio 5.2 (H) <=5.0 Ratio   VLDL 17 <30 mg/dL   LDL  Cholesterol 198 (H) <130 mg/dL  Comp Met (CMET)  Result Value Ref Range   Sodium 139 135 - 146 mmol/L   Potassium 4.2 3.5 - 5.3 mmol/L   Chloride 103 98 - 110 mmol/L   CO2 24 20 - 31 mmol/L   Glucose, Bld 105 (H) 65 - 99 mg/dL   BUN 16 7 - 25 mg/dL   Creat 0.79 0.50 - 1.05 mg/dL   Total Bilirubin 0.4 0.2 - 1.2 mg/dL   Alkaline Phosphatase 90 33 - 130 U/L   AST 20 10 - 35 U/L   ALT 17 6 - 29 U/L   Total Protein 8.4 (H) 6.1 - 8.1 g/dL   Albumin 4.5 3.6 - 5.1 g/dL   Calcium 10.0 8.6 - 10.4 mg/dL     EKG/XRAY:   Primary read interpreted by Dr. Marin Comment at [facility].   ASSESSMENT/PLAN: Encounter Diagnoses  Name Primary?  . Acute maxillary sinusitis, recurrence not specified Yes  . Otalgia of left ear   . Otitis, externa, infective, left   . ft non-suppurative otitis media, recurrence not specified   . Sinus tachycardia (HCC)    Rx ciprodex, augmentin Tylenol and ibuprofen for pain  Fu prn  She will see Dr Acie Fredrickson, cardiology for sinu tach and also BP   Gross sideeffects, risk and benefits, and alternatives of medications d/w patient. Patient is aware that all medications have potential sideeffects and we are unable to predict every sideeffect or drug-drug interaction that may occur.    DO  01/30/2016 4:08 PM

## 2016-01-31 NOTE — Telephone Encounter (Signed)
She is  feeling better  After taking xanax, hold off on the ear drops and see if just the augmentin wll help

## 2016-02-07 ENCOUNTER — Other Ambulatory Visit: Payer: Managed Care, Other (non HMO)

## 2016-02-11 ENCOUNTER — Ambulatory Visit: Payer: Managed Care, Other (non HMO) | Admitting: Cardiovascular Disease

## 2016-02-12 ENCOUNTER — Encounter: Payer: Self-pay | Admitting: Cardiovascular Disease

## 2016-02-23 ENCOUNTER — Other Ambulatory Visit: Payer: Self-pay | Admitting: Family Medicine

## 2016-02-25 ENCOUNTER — Other Ambulatory Visit: Payer: Self-pay | Admitting: Family Medicine

## 2016-02-26 ENCOUNTER — Ambulatory Visit: Payer: Self-pay | Admitting: Neurology

## 2016-02-26 DIAGNOSIS — Z029 Encounter for administrative examinations, unspecified: Secondary | ICD-10-CM

## 2016-02-27 NOTE — Telephone Encounter (Signed)
I believe this is a duplicate request. Patient is to rtc if her nausea or vomiting persists. I signed a different refill for promethazine for 20 tablets however.

## 2016-03-10 ENCOUNTER — Other Ambulatory Visit: Payer: Self-pay | Admitting: Family Medicine

## 2016-03-11 ENCOUNTER — Other Ambulatory Visit: Payer: Self-pay | Admitting: Family Medicine

## 2016-03-11 NOTE — Telephone Encounter (Signed)
I will provide 1 courtesy refill. Please let patient know that Dr. Conley RollsLe no longer works here and she can try to establish care at the walk in clinic. Unfortunately, we cannot sustain our patient work load given that we are transitioning between retiring physicians and hiring new ones. So she may need to establish care at a primary care practice. Thank you!

## 2016-03-12 NOTE — Telephone Encounter (Signed)
Faxed and notified pt by MyChart.

## 2016-04-08 ENCOUNTER — Other Ambulatory Visit: Payer: Self-pay | Admitting: Family Medicine

## 2016-04-21 NOTE — Telephone Encounter (Signed)
Rx printed at 104. Will bring to 102 after clinic.  Please ask patient who she intends to follow-up with.Please advise her to see that person before she needs another fill.  Meds ordered this encounter  Medications  . zolpidem (AMBIEN) 10 MG tablet    Sig: TAKE 1 TAB BY MOUTH AT BEDTIME AS NEEDED FOR SLEEP    Dispense:  30 tablet    Refill:  0    Not to exceed 3 additional fills before 06/13/2016

## 2016-04-21 NOTE — Telephone Encounter (Signed)
This was sent to Dr Conley RollsLe in error. I just found this request in EPIC when pharm faxed another request. Can someone else please RF?

## 2016-04-22 NOTE — Telephone Encounter (Signed)
Faxed and notified pt of Chelle's message on VM.

## 2016-05-03 ENCOUNTER — Observation Stay (HOSPITAL_COMMUNITY)
Admission: EM | Admit: 2016-05-03 | Discharge: 2016-05-04 | Disposition: A | Discharge status: Home / Self Care | Payer: Managed Care, Other (non HMO) | Attending: Internal Medicine | Admitting: Internal Medicine

## 2016-05-03 ENCOUNTER — Encounter (HOSPITAL_COMMUNITY): Payer: Self-pay

## 2016-05-03 ENCOUNTER — Observation Stay (HOSPITAL_COMMUNITY): Payer: Managed Care, Other (non HMO)

## 2016-05-03 ENCOUNTER — Emergency Department (HOSPITAL_COMMUNITY): Payer: Managed Care, Other (non HMO)

## 2016-05-03 DIAGNOSIS — M1990 Unspecified osteoarthritis, unspecified site: Secondary | ICD-10-CM | POA: Insufficient documentation

## 2016-05-03 DIAGNOSIS — Z7982 Long term (current) use of aspirin: Secondary | ICD-10-CM | POA: Diagnosis not present

## 2016-05-03 DIAGNOSIS — Z8739 Personal history of other diseases of the musculoskeletal system and connective tissue: Secondary | ICD-10-CM

## 2016-05-03 DIAGNOSIS — E785 Hyperlipidemia, unspecified: Secondary | ICD-10-CM | POA: Diagnosis not present

## 2016-05-03 DIAGNOSIS — F419 Anxiety disorder, unspecified: Secondary | ICD-10-CM | POA: Diagnosis not present

## 2016-05-03 DIAGNOSIS — R0602 Shortness of breath: Secondary | ICD-10-CM | POA: Diagnosis present

## 2016-05-03 DIAGNOSIS — R000 Tachycardia, unspecified: Secondary | ICD-10-CM | POA: Insufficient documentation

## 2016-05-03 DIAGNOSIS — J189 Pneumonia, unspecified organism: Secondary | ICD-10-CM | POA: Diagnosis not present

## 2016-05-03 DIAGNOSIS — G40409 Other generalized epilepsy and epileptic syndromes, not intractable, without status epilepticus: Secondary | ICD-10-CM | POA: Insufficient documentation

## 2016-05-03 DIAGNOSIS — J209 Acute bronchitis, unspecified: Secondary | ICD-10-CM

## 2016-05-03 DIAGNOSIS — I809 Phlebitis and thrombophlebitis of unspecified site: Secondary | ICD-10-CM | POA: Diagnosis not present

## 2016-05-03 DIAGNOSIS — Z79899 Other long term (current) drug therapy: Secondary | ICD-10-CM | POA: Diagnosis not present

## 2016-05-03 DIAGNOSIS — Z8679 Personal history of other diseases of the circulatory system: Secondary | ICD-10-CM

## 2016-05-03 DIAGNOSIS — I252 Old myocardial infarction: Secondary | ICD-10-CM

## 2016-05-03 DIAGNOSIS — Z8249 Family history of ischemic heart disease and other diseases of the circulatory system: Secondary | ICD-10-CM

## 2016-05-03 DIAGNOSIS — E876 Hypokalemia: Secondary | ICD-10-CM | POA: Diagnosis not present

## 2016-05-03 DIAGNOSIS — K219 Gastro-esophageal reflux disease without esophagitis: Secondary | ICD-10-CM | POA: Diagnosis not present

## 2016-05-03 DIAGNOSIS — R569 Unspecified convulsions: Secondary | ICD-10-CM

## 2016-05-03 DIAGNOSIS — J208 Acute bronchitis due to other specified organisms: Secondary | ICD-10-CM | POA: Diagnosis not present

## 2016-05-03 DIAGNOSIS — I471 Supraventricular tachycardia: Secondary | ICD-10-CM | POA: Diagnosis present

## 2016-05-03 DIAGNOSIS — N200 Calculus of kidney: Secondary | ICD-10-CM | POA: Insufficient documentation

## 2016-05-03 DIAGNOSIS — M352 Behcet's disease: Secondary | ICD-10-CM | POA: Diagnosis present

## 2016-05-03 DIAGNOSIS — G40909 Epilepsy, unspecified, not intractable, without status epilepticus: Secondary | ICD-10-CM

## 2016-05-03 DIAGNOSIS — J9600 Acute respiratory failure, unspecified whether with hypoxia or hypercapnia: Secondary | ICD-10-CM | POA: Diagnosis present

## 2016-05-03 DIAGNOSIS — Z95818 Presence of other cardiac implants and grafts: Secondary | ICD-10-CM

## 2016-05-03 LAB — CBC WITH DIFFERENTIAL/PLATELET
BASOS ABS: 0 10*3/uL (ref 0.0–0.1)
BASOS PCT: 0 %
EOS ABS: 0.2 10*3/uL (ref 0.0–0.7)
Eosinophils Relative: 2 %
HEMATOCRIT: 37.8 % (ref 36.0–46.0)
HEMOGLOBIN: 12.7 g/dL (ref 12.0–15.0)
Lymphocytes Relative: 27 %
Lymphs Abs: 2 10*3/uL (ref 0.7–4.0)
MCH: 29.9 pg (ref 26.0–34.0)
MCHC: 33.6 g/dL (ref 30.0–36.0)
MCV: 88.9 fL (ref 78.0–100.0)
Monocytes Absolute: 0.4 10*3/uL (ref 0.1–1.0)
Monocytes Relative: 5 %
NEUTROS PCT: 66 %
Neutro Abs: 4.8 10*3/uL (ref 1.7–7.7)
Platelets: 209 10*3/uL (ref 150–400)
RBC: 4.25 MIL/uL (ref 3.87–5.11)
RDW: 13 % (ref 11.5–15.5)
WBC: 7.3 10*3/uL (ref 4.0–10.5)

## 2016-05-03 LAB — COMPREHENSIVE METABOLIC PANEL
ALK PHOS: 98 U/L (ref 38–126)
ALT: 19 U/L (ref 14–54)
ANION GAP: 13 (ref 5–15)
AST: 23 U/L (ref 15–41)
Albumin: 3.8 g/dL (ref 3.5–5.0)
BILIRUBIN TOTAL: 0.5 mg/dL (ref 0.3–1.2)
BUN: 11 mg/dL (ref 6–20)
CALCIUM: 9 mg/dL (ref 8.9–10.3)
CO2: 19 mmol/L — AB (ref 22–32)
Chloride: 104 mmol/L (ref 101–111)
Creatinine, Ser: 0.83 mg/dL (ref 0.44–1.00)
Glucose, Bld: 150 mg/dL — ABNORMAL HIGH (ref 65–99)
Potassium: 3.4 mmol/L — ABNORMAL LOW (ref 3.5–5.1)
SODIUM: 136 mmol/L (ref 135–145)
TOTAL PROTEIN: 7.7 g/dL (ref 6.5–8.1)

## 2016-05-03 LAB — URINALYSIS, ROUTINE W REFLEX MICROSCOPIC
Bilirubin Urine: NEGATIVE
Glucose, UA: NEGATIVE mg/dL
KETONES UR: 15 mg/dL — AB
NITRITE: NEGATIVE
PH: 6 (ref 5.0–8.0)
Protein, ur: NEGATIVE mg/dL
SPECIFIC GRAVITY, URINE: 1.024 (ref 1.005–1.030)

## 2016-05-03 LAB — I-STAT CG4 LACTIC ACID, ED: Lactic Acid, Venous: 3.66 mmol/L (ref 0.5–2.0)

## 2016-05-03 LAB — URINE MICROSCOPIC-ADD ON

## 2016-05-03 LAB — POCT I-STAT TROPONIN I: Troponin i, poc: 0 ng/mL (ref 0.00–0.08)

## 2016-05-03 MED ORDER — ACETAMINOPHEN 325 MG PO TABS
650.0000 mg | ORAL_TABLET | Freq: Four times a day (QID) | ORAL | Status: DC | PRN
  Administered 2016-05-03 – 2016-05-04 (×2): 650 mg via ORAL
  Filled 2016-05-03: qty 2

## 2016-05-03 MED ORDER — ENOXAPARIN SODIUM 40 MG/0.4ML ~~LOC~~ SOLN
40.0000 mg | SUBCUTANEOUS | Status: DC
  Administered 2016-05-03: 40 mg via SUBCUTANEOUS
  Filled 2016-05-03: qty 0.4

## 2016-05-03 MED ORDER — ALBUTEROL SULFATE (2.5 MG/3ML) 0.083% IN NEBU: 2.5000 mg | INHALATION_SOLUTION | RESPIRATORY_TRACT | Status: AC | PRN

## 2016-05-03 MED ORDER — DEXTROSE 5 % IV SOLN
1.0000 g | INTRAVENOUS | Status: DC
  Filled 2016-05-03: qty 10

## 2016-05-03 MED ORDER — ASPIRIN 81 MG PO TABS: 81.0000 mg | ORAL_TABLET | Freq: Every morning | ORAL | Status: DC

## 2016-05-03 MED ORDER — ATORVASTATIN CALCIUM 40 MG PO TABS
40.0000 mg | ORAL_TABLET | Freq: Every day | ORAL | Status: DC
Start: 2016-05-03 — End: 2016-05-04
  Administered 2016-05-03: 40 mg via ORAL
  Filled 2016-05-03: qty 1

## 2016-05-03 MED ORDER — SODIUM CHLORIDE 0.9 % IV BOLUS (SEPSIS)
2500.0000 mL | Freq: Once | INTRAVENOUS | Status: AC
  Administered 2016-05-03: 2500 mL via INTRAVENOUS

## 2016-05-03 MED ORDER — DEXTROSE 5 % IV SOLN
250.0000 mg | INTRAVENOUS | Status: DC
  Administered 2016-05-04: 250 mg via INTRAVENOUS
  Filled 2016-05-03: qty 250

## 2016-05-03 MED ORDER — SODIUM CHLORIDE 0.9 % IV SOLN
INTRAVENOUS | Status: AC
  Administered 2016-05-03: 150 mL/h via INTRAVENOUS

## 2016-05-03 MED ORDER — GABAPENTIN 300 MG PO CAPS
900.0000 mg | ORAL_CAPSULE | Freq: Three times a day (TID) | ORAL | Status: DC
  Administered 2016-05-03 – 2016-05-04 (×3): 900 mg via ORAL
  Filled 2016-05-03 (×3): qty 3

## 2016-05-03 MED ORDER — POTASSIUM CHLORIDE CRYS ER 20 MEQ PO TBCR
40.0000 meq | EXTENDED_RELEASE_TABLET | Freq: Once | ORAL | Status: AC
  Administered 2016-05-03: 40 meq via ORAL
  Filled 2016-05-03: qty 2

## 2016-05-03 MED ORDER — LORAZEPAM 2 MG/ML IJ SOLN
0.5000 mg | Freq: Once | INTRAMUSCULAR | Status: AC
  Administered 2016-05-03: 0.5 mg via INTRAVENOUS
  Filled 2016-05-03: qty 1

## 2016-05-03 MED ORDER — ACETAMINOPHEN 325 MG PO TABS
650.0000 mg | ORAL_TABLET | Freq: Once | ORAL | Status: AC
  Administered 2016-05-03: 650 mg via ORAL
  Filled 2016-05-03: qty 2

## 2016-05-03 MED ORDER — DIVALPROEX SODIUM 500 MG PO DR TAB
500.0000 mg | DELAYED_RELEASE_TABLET | Freq: Three times a day (TID) | ORAL | Status: DC
  Administered 2016-05-03 – 2016-05-04 (×3): 500 mg via ORAL
  Filled 2016-05-03 (×3): qty 1

## 2016-05-03 MED ORDER — ALBUTEROL SULFATE (2.5 MG/3ML) 0.083% IN NEBU
5.0000 mg | INHALATION_SOLUTION | Freq: Once | RESPIRATORY_TRACT | Status: AC
  Administered 2016-05-03: 5 mg via RESPIRATORY_TRACT
  Filled 2016-05-03: qty 6

## 2016-05-03 MED ORDER — HYDROXYCHLOROQUINE SULFATE 200 MG PO TABS
200.0000 mg | ORAL_TABLET | Freq: Two times a day (BID) | ORAL | Status: DC
  Administered 2016-05-03 – 2016-05-04 (×2): 200 mg via ORAL
  Filled 2016-05-03 (×2): qty 1

## 2016-05-03 MED ORDER — PROPRANOLOL HCL 10 MG PO TABS
10.0000 mg | ORAL_TABLET | Freq: Four times a day (QID) | ORAL | Status: DC
  Administered 2016-05-03: 10 mg via ORAL
  Filled 2016-05-03 (×4): qty 1

## 2016-05-03 MED ORDER — TOPIRAMATE 100 MG PO TABS
100.0000 mg | ORAL_TABLET | Freq: Every day | ORAL | Status: DC
  Administered 2016-05-03 – 2016-05-04 (×2): 100 mg via ORAL
  Filled 2016-05-03 (×2): qty 1

## 2016-05-03 MED ORDER — BENZONATATE 100 MG PO CAPS
100.0000 mg | ORAL_CAPSULE | Freq: Three times a day (TID) | ORAL | Status: DC | PRN
  Administered 2016-05-03 – 2016-05-04 (×3): 100 mg via ORAL
  Filled 2016-05-03 (×3): qty 1

## 2016-05-03 MED ORDER — ASPIRIN 81 MG PO CHEW
81.0000 mg | CHEWABLE_TABLET | Freq: Every day | ORAL | Status: DC
  Administered 2016-05-03 – 2016-05-04 (×2): 81 mg via ORAL
  Filled 2016-05-03 (×2): qty 1

## 2016-05-03 MED ORDER — DEXTROSE 5 % IV SOLN
500.0000 mg | Freq: Once | INTRAVENOUS | Status: AC
  Administered 2016-05-03: 500 mg via INTRAVENOUS
  Filled 2016-05-03: qty 500

## 2016-05-03 MED ORDER — SODIUM CHLORIDE 0.9% FLUSH
10.0000 mL | INTRAVENOUS | Status: DC | PRN
  Administered 2016-05-04: 10 mL
  Filled 2016-05-03: qty 40

## 2016-05-03 MED ORDER — GUAIFENESIN-DM 100-10 MG/5ML PO SYRP
5.0000 mL | ORAL_SOLUTION | ORAL | Status: DC | PRN
Start: 2016-05-03 — End: 2016-05-04
  Administered 2016-05-03 – 2016-05-04 (×2): 5 mL via ORAL
  Filled 2016-05-03 (×3): qty 5

## 2016-05-03 MED ORDER — ALTEPLASE 2 MG IJ SOLR
2.0000 mg | Freq: Once | INTRAMUSCULAR | Status: AC
  Administered 2016-05-03: 2 mg
  Filled 2016-05-03 (×2): qty 2

## 2016-05-03 MED ORDER — LEVETIRACETAM 500 MG PO TABS
1000.0000 mg | ORAL_TABLET | Freq: Two times a day (BID) | ORAL | Status: DC
  Administered 2016-05-03 – 2016-05-04 (×2): 1000 mg via ORAL
  Filled 2016-05-03 (×2): qty 2

## 2016-05-03 MED ORDER — DEXTROSE 5 % IV SOLN
1.0000 g | Freq: Once | INTRAVENOUS | Status: AC
  Administered 2016-05-03: 1 g via INTRAVENOUS
  Filled 2016-05-03: qty 10

## 2016-05-03 NOTE — ED Notes (Signed)
Dr. Jeraldine LootsLockwood aware of lactic results.

## 2016-05-03 NOTE — ED Notes (Addendum)
Patient here with increasing shortness of breath, fever, and headache since Friday. On arrival tachypnea and coughing present, appears anxious with hyperventilating. Had tylenol for fevr 2 hours before arrival

## 2016-05-03 NOTE — H&P (Signed)
Date: 05/03/2016               Patient Name:  Brenda Mcguire MRN: [id number]  DOB: [birth year] Age / Sex: 55 y.o., female   PCP: Glenford Bayley, DO              Medical Service: Internal Medicine Teaching Service              Attending Physician: Dr. Sid Falcon, MD    First Contact: Devonne Doughty, [place] [facility] Pager: [phone]  Second Contact: Dr. Jule Ser Pager: [phone]  Third Contact Dr. Dellia Nims Pager: [phone]       After Hours (After 5p/  First Contact Pager: [phone]  weekends / holidays): Second Contact Pager: [phone]   Chief Complaint: shortness of breath  History of Present Illness: Brenda Mcguire is a 55 yo F with history of Behcet's disease, tonic clonic seizures, NSTEMI, hyperlipidemia, and GERD who is presenting with three days of cough, myalgias, headache, and SOB.  Pt states she was in her normal state of health until three days ago when she started to feel a cold coming on.  She says she developed a productive cough, headache, generalized muscle pain, and began sneezing.  Symptoms have gotten progressively worse over past 24 hours.  She says this morning she developed wheezing and dyspnea, which prompted her visit to the ED.  Last night she ran a fever of 104.2.  She has tried Nyquil without relief of symptoms. 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  She says one of her co-workers had been sick with a respiratory illness recently, but that co-worker never had a fever.  She denies any other recent sick contacts.  She is widowed and lives alone.  Pt has had pneumonia in the past, but says she never had shortness of breath like this before.  She has no history of asthma or other lung disease, and no family history of lung disease.  She had the flu once in the past, when she was a teenager.  She has never smoked, does not drink, and does not use drugs.  She says her chest wall hurts from coughing, but denies CP or palpitations.  Denies nausea but says she vomiting once from coughing so much.  She denies dysuria, abdominal pain, diarrhea, or recent illnesses prior to onset of current symptoms.  She has not had a much of an appetite since symptoms began three days ago.   Of note, patient has a history of seizures which are treated with Depakote, Keppra, and Topomax.  She has not taken these medicines since symptoms began three days ago, but she normally takes them daily.  Her last seizure was over 18 months ago. She also states she takes propanolol PRN for paroxysmal SVTs, but it has been a few weeks since she has had an episode of SVT.  She also says she had an NSTEMI in the past while having a seizure.  Most recent ECHO from 2015 was normal.  She has never had a stress test or cardiac cath.  She has a family history of MI - her father died of an MI at age 4 and her mom had an MI at age 51.  No history of thyroid disease.     On admission to ED, patient was febrile to 101.3, tachycardic to the 140s, and tachypnic to 25.  Pt was satting 100% ORA.  In the  ED she received Ativan for anxiety, as well as supplemental oxygen and nebulizer treatments which improved her symptoms.  She was starting on Ceftriaxone and azithromycin in the ED and received a 2500 mL bolus of normal saline.   Meds: Current Facility-Administered Medications  Medication Dose Route Frequency Provider Last Rate Last Dose  . albuterol (PROVENTIL) (2.5 MG/3ML) 0.083% nebulizer solution 2.5 mg  2.5 mg Nebulization Q4H PRN Carmin Muskrat, MD      . aspirin tablet 81 mg  81 mg Oral q morning - 10a Tasrif Ahmed, MD      . atorvastatin (LIPITOR) tablet 40 mg  40 mg Oral Daily Tasrif Ahmed, MD      . Derrill Memo ON 05/04/2016] azithromycin (ZITHROMAX) 250 mg in dextrose 5 % 125 mL IVPB  250 mg Intravenous Q24H Tasrif Ahmed, MD      . azithromycin (ZITHROMAX) 500 mg in dextrose 5 % 250 mL IVPB  500 mg Intravenous Once Carmin Muskrat, MD 250 mL/hr at 05/03/16 1446 500 mg at  05/03/16 1446  . benzonatate (TESSALON) capsule 100 mg  100 mg Oral TID PRN Tasrif Ahmed, MD      . cefTRIAXone (ROCEPHIN) 1 g in dextrose 5 % 50 mL IVPB  1 g Intravenous Q24H Tasrif Ahmed, MD      . divalproex (DEPAKOTE) DR tablet 500 mg  500 mg Oral Q8H Tasrif Ahmed, MD      . enoxaparin (LOVENOX) injection 40 mg  40 mg Subcutaneous Q24H Tasrif Ahmed, MD      . gabapentin (NEURONTIN) capsule 900 mg  900 mg Oral TID Tasrif Ahmed, MD      . hydroxychloroquine (PLAQUENIL) tablet 200 mg  200 mg Oral BID Tasrif Ahmed, MD      . levETIRAcetam (KEPPRA) tablet 1,000 mg  1,000 mg Oral BID Tasrif Ahmed, MD      . potassium chloride SA (K-DUR,KLOR-CON) CR tablet 40 mEq  40 mEq Oral Once Tasrif Ahmed, MD      . propranolol (INDERAL) tablet 10 mg  10 mg Oral QID Tasrif Ahmed, MD      . topiramate (TOPAMAX) tablet 100 mg  100 mg Oral Daily Tasrif Ahmed, MD       Current Outpatient Prescriptions  Medication Sig Dispense Refill  . aspirin 81 MG tablet Take 81 mg by mouth every morning.     Marland Kitchen atorvastatin (LIPITOR) 40 MG tablet Take 1 tablet (40 mg total) by mouth daily. 31 tablet 11  . divalproex (DEPAKOTE) 250 MG DR tablet Take 2 tablets 3 times daily 540 tablet 0  . gabapentin (NEURONTIN) 300 MG capsule TAKE 3 CAPSULES (900 MG TOTAL) BY MOUTH 3 (THREE) TIMES DAILY. 270 capsule 2  . hydroxychloroquine (PLAQUENIL) 200 MG tablet Take 1 tablet (200 mg total) by mouth 2 (two) times daily. 60 tablet 5  . levETIRAcetam (KEPPRA) 500 MG tablet Take 2 tablets 2 times daily 360 tablet 0  . Pseudoeph-Doxylamine-DM-APAP (NYQUIL PO) Take 1 tablet by mouth 3 (three) times daily as needed (cold).    . topiramate (TOPAMAX) 100 MG tablet Take 1 tablet (100 mg total) by mouth daily. 30 tablet 6  . zolpidem (AMBIEN) 10 MG tablet TAKE 1 TAB BY MOUTH AT BEDTIME AS NEEDED FOR SLEEP 30 tablet 0  . ALPRAZolam (XANAX) 0.5 MG tablet TAKE 1 TAB BYMOUTH 3 TIMES A DAY AS NEEDED (Patient not taking: Reported on 05/03/2016) 90 tablet 0    . amitriptyline (ELAVIL) 25 MG tablet TAKE 1 TABLET (25 MG  TOTAL) BY MOUTH AT BEDTIME. (Patient not taking: Reported on 05/03/2016) 30 tablet 5  . escitalopram (LEXAPRO) 10 MG tablet Take 1 tablet (10 mg total) by mouth daily. (Patient not taking: Reported on 01/30/2016) 30 tablet 0  . propranolol (INDERAL) 10 MG tablet Take 1 tablet (10 mg total) by mouth 4 (four) times daily as needed. 60 tablet 6    Allergies: Allergies as of 05/03/2016 - Review Complete 05/03/2016  Allergen Reaction Noted  . Hepatitis b vaccine Hives and Shortness Of Breath 03/02/2013  . Imitrex [sumatriptan] Other (See Comments) 03/02/2013  . Iohexol Anaphylaxis, Hives, and Shortness Of Breath 03/02/2013  . Contrast media [iodinated diagnostic agents]  10/12/2015  . Codeine Itching and Rash 03/02/2013   Past Medical History  Diagnosis Date  . GERD (gastroesophageal reflux disease)   . Hyperlipidemia   . History of blood transfusion   . Chronic headaches   . Phlebitis   . History of colonic polyps   . Behcet's disease ([place])     has port-a-cath  . Tonic clonic seizures ([facility])     Pseudo-Seizure  . Anxiety     "situational stress"  . Kidney stones 4854,6270  . Myocardial infarction ([place])   . Arthritis     Rheumatoid   Past Surgical History  Procedure Laterality Date  . Cholecystectomy    . Tonsillectomy  1970  . Appendectomy  1975  . Lumbar fusion  1999    L5-S1  . Abdominal hysterectomy  1988  . Portacath placement Left 2008  . Stone extraction with basket    . Back surgery      L5-S1 ALIF  . Adenoidectomy  at 55 years old  . Oophorectomy  1987  . Radiology with anesthesia N/A 10/24/2014    Procedure: RADIOLOGY WITH ANESTHESIA;  Surgeon: Medication Radiologist, MD;  Location: [facility];  Service: Radiology;  Laterality: N/A;   Family History  Problem Relation Age of Onset  . Diabetes Mother   . Hyperlipidemia Mother   . Stroke Mother   . Hypertension Mother   . Colon cancer Mother   . Breast  cancer Mother   . Early death Father   . Heart attack Father   . Heart disease Father   . Hyperlipidemia Father   . COPD Sister   . Heart disease Maternal Grandmother   . Heart disease Maternal Grandfather   . Heart disease Paternal Grandmother   . Heart disease Paternal Grandfather    Social History   Social History  . Marital Status: Widowed    Spouse Name: N/A  . Number of Children: 2  . Years of Education: 14   Occupational History  . Nurse     [employer]  .     Social History Main Topics  . Smoking status: Never Smoker   . Smokeless tobacco: Never Used  . Alcohol Use: No  . Drug Use: No  . Sexual Activity: Yes    Birth Control/ Protection: Surgical   Other Topics Concern  . Not on file   Social History Narrative   Lives with husband.  They have 2 grown children.   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 Negative for chills.  HENT: Negative for sore throat.   Respiratory: Positive for  cough, sputum production, shortness of breath and wheezing. Negative for hemoptysis.   Cardiovascular: Negative for chest pain, palpitations and leg swelling.  Gastrointestinal: Positive for vomiting. Negative for nausea, abdominal pain and diarrhea.  Genitourinary: Negative for dysuria.  Musculoskeletal: Positive for myalgias.  Skin: Negative for itching and rash.  Neurological: Positive for headaches. Negative for dizziness, focal weakness, seizures and loss of consciousness.    Physical Exam: Blood pressure 115/69, pulse 112, temperature 101.3 F (38.5 C), temperature source Oral, resp. rate 16, SpO2 97 %. BP 110/73 mmHg  Pulse 107  Temp(Src) 101.3 F (38.5 C) (Oral)  Resp 19  SpO2 97%  General Appearance:    Alert, cooperative.   Head:     Normocephalic, without obvious abnormality, atraumatic  Eyes:    PERRL, conjunctiva/corneas clear.  Throat:   Lips, mucosa, and tongue normal; teeth and gums normal  Lungs:     Wheezing throughout, R>L.   No crackles.  Tachypneic.     Heart:    Tachycardic.  Regular rhythm.  No murmurs.  2+ radial pulses.    Abdomen:     Soft, non-tender, bowel sounds active all four quadrants,    no masses, no organomegaly  Extremities:  Warm, diaphoretic.  No edema. Port site on L chest from prior chemo years ago.   Neurologic:   AOx3.  Normal plantar strength.     Lab results: Results for orders placed or performed during the hospital encounter of 05/03/16 (from the past 48 hour(s))  Comprehensive metabolic panel     Status: Abnormal   Collection Time: 05/03/16 12:48 PM  Result Value Ref Range   Sodium 136 135 - 145 mmol/L   Potassium 3.4 (L) 3.5 - 5.1 mmol/L   Chloride 104 101 - 111 mmol/L   CO2 19 (L) 22 - 32 mmol/L   Glucose, Bld 150 (H) 65 - 99 mg/dL   BUN 11 6 - 20 mg/dL   Creatinine, Ser 0.83 0.44 - 1.00 mg/dL   Calcium 9.0 8.9 - 10.3 mg/dL   Total Protein 7.7 6.5 - 8.1 g/dL   Albumin 3.8 3.5 - 5.0 g/dL   AST 23 15 - 41 U/L   ALT 19 14 - 54 U/L   Alkaline Phosphatase 98 38 - 126 U/L   Total Bilirubin 0.5 0.3 - 1.2 mg/dL   GFR calc non Af Amer >60 >60 mL/min   GFR calc Af Amer >60 >60 mL/min    Comment: (NOTE) The eGFR has been calculated using the CKD EPI equation. This calculation has not been validated in all clinical situations. eGFR's persistently <60 mL/min signify possible Chronic Kidney Disease.    Anion gap 13 5 - 15  CBC WITH DIFFERENTIAL     Status: None   Collection Time: 05/03/16 12:48 PM  Result Value Ref Range   WBC 7.3 4.0 - 10.5 K/uL   RBC 4.25 3.87 - 5.11 MIL/uL   Hemoglobin 12.7 12.0 - 15.0 g/dL   HCT 37.8 36.0 - 46.0 %   MCV 88.9 78.0 - 100.0 fL   MCH 29.9 26.0 - 34.0 pg   MCHC 33.6 30.0 - 36.0 g/dL   RDW 13.0 11.5 - 15.5 %   Platelets 209 150 - 400 K/uL    Neutrophils Relative % 66 %   Neutro Abs 4.8 1.7 - 7.7 K/uL   Lymphocytes Relative 27 %   Lymphs Abs 2.0 0.7 - 4.0 K/uL   Monocytes Relative 5 %   Monocytes Absolute 0.4 0.1 -  1.0 K/uL   Eosinophils Relative 2 %   Eosinophils Absolute 0.2 0.0 - 0.7 K/uL   Basophils Relative 0 %   Basophils Absolute 0.0 0.0 - 0.1 K/uL  I-Stat CG4 Lactic Acid, ED  (not at  [facility])     Status: Abnormal   Collection Time: 05/03/16  1:04 PM  Result Value Ref Range   Lactic Acid, Venous 3.66 (HH) 0.5 - 2.0 mmol/L   Comment NOTIFIED PHYSICIAN   Urinalysis, Routine w reflex microscopic (not at [facility])     Status: Abnormal   Collection Time: 05/03/16  2:28 PM  Result Value Ref Range   Color, Urine YELLOW YELLOW   APPearance CLEAR CLEAR   Specific Gravity, Urine 1.024 1.005 - 1.030   pH 6.0 5.0 - 8.0   Glucose, UA NEGATIVE NEGATIVE mg/dL   Hgb urine dipstick TRACE (A) NEGATIVE   Bilirubin Urine NEGATIVE NEGATIVE   Ketones, ur 15 (A) NEGATIVE mg/dL   Protein, ur NEGATIVE NEGATIVE mg/dL   Nitrite NEGATIVE NEGATIVE   Leukocytes, UA SMALL (A) NEGATIVE  Urine microscopic-add on     Status: Abnormal   Collection Time: 05/03/16  2:28 PM  Result Value Ref Range   Squamous Epithelial / LPF 0-5 (A) NONE SEEN   WBC, UA 6-30 0 - 5 WBC/hpf   RBC / HPF 0-5 0 - 5 RBC/hpf   Bacteria, UA FEW (A) NONE SEEN    Imaging results:  Dg Chest Port 1 View  05/03/2016  CLINICAL DATA:  Worsening shortness of breath. Fever and headache. History of myocardial infarction. EXAM: PORTABLE CHEST 1 VIEW COMPARISON:  10/15/2015; 03/16/2015 FINDINGS: Grossly unchanged cardiac silhouette and mediastinal contours given reduced lung volumes. Stable position of support apparatus. Overall improved aeration of the lungs though there are persistent left mid and lower lung ill-defined interstitial opacities. No pleural effusion or pneumothorax. No acute osseous abnormalities. IMPRESSION: Improved aeration of lungs with persistent ill-defined  heterogeneous opacities within the left mid and lower lung - differential considerations are broad though include asymmetric pulmonary edema versus atypical infection. A follow-up chest radiograph in 3 to 4 weeks after treatment is recommended to ensure resolution. Electronically Signed   By: Sandi Mariscal M.D.   On: 05/03/2016 14:02    Other results: EKG: Sinus tachycardia Abnormal R-wave progression, late transition Left ventricular hypertrophy Borderline T abnormalities, inferior leads Borderline prolonged QT interval   Assessment & Plan by Problem: 55 yo with three day history of cough, myalgia, headache and SOB.  Febrile, tachycardic, and tachypneic with normal WBC count.   Cough, SOB, headache, myalgias  Most concerned for viral pneumonia in this patient given her cough, SOB, tachycardia and fever.  She does not have an elevated WBC count and CXR does not show a lobar pneumonia.  Portable CXR shows "ill-defined heterogeneous opacities within the left mid and lower lung" which may be due to an atypical pneumonia, which certainly cannot be ruled out given patient's symptoms.  Will plan to treat with antibiotics until upright CXR can be performed.  Also concern for bronchitis or influenza given cough and SOB.  Will workup for flu and repeat CXR, meanwhile continuing antibiotic therapy for possible bacterial PNA.  BPs have been soft since admission - currently 101/70.  Not currently requiring oxygen therapy, satting at 94% ORA.  Lactic acid elevated at 3.66, which raises concern for sepsis.  Will repeat test.  -Start ceftriaxone + azithromycin IV  -Albuterol nebulizer treatments q4 PRN  -Tessalon for cough tid/PRN -2500  cc bolus of NS for dehydration  -CXR: AP, lateral  -Repeat lactic acid -floor bed with tele given tachycardia and history of SVTs -Influenza panel -Blood cultures -Urine cultures  -TSH -HIV Ab test   Hypokalemia Potassium 3.4 on admission. Replete PRN.  -KDur 72mq bid    -Check Mg level   Behcet's disease Pt is followed by neurology and takes Plaquenil.  Currently well-controlled.  -Continue home Plaquenil 2068mtablets   Seizure disorder Followed by neurology.  Has not taken home meds for past 3 days.  No seizure activity for over 18 months. -Restart home Keppra, Depakote, Topamax   Hyperlipidemia -Continue home Lipitor  History of SVTs History of SVTs which pt takes propanol PRN for - has not needed propanolol in past few weeks.  Tachy on admission. -Propanolol 4xdaily/PRN -floor bed with tele  This is a [employer] information officerote.  The care of the patient was discussed with Dr. AnJule Sernd the assessment and plan was formulated with their assistance.  Please see their note for official documentation of the patient encounter.   Signed: CoRandal BubaMed Student 05/03/2016, 3:37 PM

## 2016-05-03 NOTE — ED Notes (Signed)
Dr. Ninfa LindenAhmad contacted with concern that pt is admitted to med surg.

## 2016-05-03 NOTE — ED Provider Notes (Signed)
CSN: [id number]     Arrival date & time 05/03/16  1125 History   First MD Initiated Contact with Patient 05/03/16 1140     Chief Complaint  Patient presents with  . Shortness of Breath  . Fever     (Consider location/radiation/quality/duration/timing/severity/associated sxs/prior Treatment) HPI Patient presents with concern of respiratory difficulty, fever, headache. Symptoms began 3 days ago, since onset has been persistent, worsening, with no clear alleviating factors. Patient has tried Tylenol, without improvement. She has chest pain with coughing, breathing, but not with exertion. Associated nausea, no vomiting. No diarrhea. No abdominal pain. Patient states that she is generally well aside from history of Behcet's disease. Patient has a notable social history of her husband dying one year ago from pneumonia. Patient does not smoke.  Past Medical History  Diagnosis Date  . GERD (gastroesophageal reflux disease)   . Hyperlipidemia   . History of blood transfusion   . Chronic headaches   . Phlebitis   . History of colonic polyps   . Behcet's disease (HCC)     has port-a-cath  . Tonic clonic seizures (HCC)     Pseudo-Seizure  . Anxiety     "situational stress"  . Kidney stones 4098,1191  . Myocardial infarction (HCC)   . Arthritis     Rheumatoid   Past Surgical History  Procedure Laterality Date  . Cholecystectomy    . Tonsillectomy  1970  . Appendectomy  1975  . Lumbar fusion  1999    L5-S1  . Abdominal hysterectomy  1988  . Portacath placement Left 2008  . Stone extraction with basket    . Back surgery      L5-S1 ALIF  . Adenoidectomy  at 55 years old  . Oophorectomy  1987  . Radiology with anesthesia N/A 10/24/2014    Procedure: RADIOLOGY WITH ANESTHESIA;  Surgeon: Medication Radiologist, MD;  Location: [facility] OR;  Service: Radiology;  Laterality: N/A;   Family History  Problem Relation Age of Onset  . Diabetes Mother   . Hyperlipidemia Mother   . Stroke  Mother   . Hypertension Mother   . Colon cancer Mother   . Breast cancer Mother   . Early death Father   . Heart attack Father   . Heart disease Father   . Hyperlipidemia Father   . COPD Sister   . Heart disease Maternal Grandmother   . Heart disease Maternal Grandfather   . Heart disease Paternal Grandmother   . Heart disease Paternal Grandfather    Social History  Substance Use Topics  . Smoking status: Never Smoker   . Smokeless tobacco: Never Used  . Alcohol Use: No   OB History    No data available     Review of Systems  Constitutional:       Per HPI, otherwise negative  HENT:       Per HPI, otherwise negative  Respiratory:       Per HPI, otherwise negative  Cardiovascular:       Per HPI, otherwise negative  Gastrointestinal: Negative for vomiting.  Endocrine:       Negative aside from HPI  Genitourinary:       Neg aside from HPI   Musculoskeletal:       Per HPI, otherwise negative  Skin: Negative.   Neurological: Negative for syncope.      Allergies  Hepatitis b vaccine; Imitrex; Iohexol; Contrast media; and Codeine  Home Medications   Prior to Admission medications  Medication Sig Start Date End Date Taking? Authorizing Provider  aspirin 81 MG tablet Take 81 mg by mouth every morning.    Yes Historical Provider, MD  atorvastatin (LIPITOR) 40 MG tablet Take 1 tablet (40 mg total) by mouth daily. 11/12/15  Yes Vesta Mixer, MD  divalproex (DEPAKOTE) 250 MG DR tablet Take 2 tablets 3 times daily 11/10/15  Yes Thao P Le, DO  gabapentin (NEURONTIN) 300 MG capsule TAKE 3 CAPSULES (900 MG TOTAL) BY MOUTH 3 (THREE) TIMES DAILY. 11/11/15  Yes Van Clines, MD  hydroxychloroquine (PLAQUENIL) 200 MG tablet Take 1 tablet (200 mg total) by mouth 2 (two) times daily. 05/03/15  Yes Thao P Le, DO  levETIRAcetam (KEPPRA) 500 MG tablet Take 2 tablets 2 times daily 11/10/15  Yes Thao P Le, DO  Pseudoeph-Doxylamine-DM-APAP (NYQUIL PO) Take 1 tablet by mouth 3  (three) times daily as needed (cold).   Yes Historical Provider, MD  topiramate (TOPAMAX) 100 MG tablet Take 1 tablet (100 mg total) by mouth daily. 11/10/15  Yes Thao P Le, DO  zolpidem (AMBIEN) 10 MG tablet TAKE 1 TAB BY MOUTH AT BEDTIME AS NEEDED FOR SLEEP 04/21/16  Yes Chelle Jeffery, PA-C  ALPRAZolam (XANAX) 0.5 MG tablet TAKE 1 TAB BYMOUTH 3 TIMES A DAY AS NEEDED Patient not taking: Reported on 05/03/2016 03/11/16   Wallis Bamberg, PA-C  amitriptyline (ELAVIL) 25 MG tablet TAKE 1 TABLET (25 MG TOTAL) BY MOUTH AT BEDTIME. Patient not taking: Reported on 05/03/2016 01/14/16   Van Clines, MD  escitalopram (LEXAPRO) 10 MG tablet Take 1 tablet (10 mg total) by mouth daily. Patient not taking: Reported on 01/30/2016 05/06/15   Elease Etienne, MD  propranolol (INDERAL) 10 MG tablet Take 1 tablet (10 mg total) by mouth 4 (four) times daily as needed. 11/12/15   Vesta Mixer, MD   BP 106/69 mmHg  Pulse 121  Temp(Src) 101.3 F (38.5 C) (Oral)  Resp 31  SpO2 98% Physical Exam  Constitutional: She is oriented to person, place, and time. She appears well-developed and well-nourished. She appears distressed.  HENT:  Head: Normocephalic and atraumatic.  Eyes: Conjunctivae and EOM are normal.  Cardiovascular: Regular rhythm.  Tachycardia present.   Pulmonary/Chest: Accessory muscle usage present. No stridor. Tachypnea noted. She is in respiratory distress. She has decreased breath sounds. She has wheezes.  Abdominal: She exhibits no distension.  Musculoskeletal: She exhibits no edema.  Neurological: She is alert and oriented to person, place, and time. No cranial nerve deficit.  Skin: Skin is warm. She is diaphoretic.  Psychiatric: Her mood appears anxious.  Nursing note and vitals reviewed.   ED Course  Procedures (including critical care time) Labs Review Labs Reviewed  COMPREHENSIVE METABOLIC PANEL - Abnormal; Notable for the following:    Potassium 3.4 (*)    CO2 19 (*)    Glucose, Bld  150 (*)    All other components within normal limits  I-STAT CG4 LACTIC ACID, ED - Abnormal; Notable for the following:    Lactic Acid, Venous 3.66 (*)    All other components within normal limits  CULTURE, BLOOD (ROUTINE X 2)  CULTURE, BLOOD (ROUTINE X 2)  URINE CULTURE  CBC WITH DIFFERENTIAL/PLATELET  URINALYSIS, ROUTINE W REFLEX MICROSCOPIC (NOT AT [facility])  I-STAT CG4 LACTIC ACID, ED    Imaging Review Dg Chest Port 1 View  05/03/2016  CLINICAL DATA:  Worsening shortness of breath. Fever and headache. History of myocardial infarction. EXAM: PORTABLE CHEST 1  VIEW COMPARISON:  10/15/2015; 03/16/2015 FINDINGS: Grossly unchanged cardiac silhouette and mediastinal contours given reduced lung volumes. Stable position of support apparatus. Overall improved aeration of the lungs though there are persistent left mid and lower lung ill-defined interstitial opacities. No pleural effusion or pneumothorax. No acute osseous abnormalities. IMPRESSION: Improved aeration of lungs with persistent ill-defined heterogeneous opacities within the left mid and lower lung - differential considerations are broad though include asymmetric pulmonary edema versus atypical infection. A follow-up chest radiograph in 3 to 4 weeks after treatment is recommended to ensure resolution. Electronically Signed   By: Simonne ComeJohn  Watts M.D.   On: 05/03/2016 14:02   I have personally reviewed and evaluated these images and lab results as part of my medical decision-making.   EKG Interpretation   Date/Time:  Sunday May 03 2016 13:00:00 EDT Ventricular Rate:  132 PR Interval:  123 QRS Duration: 90 QT Interval:  328 QTC Calculation: 486 R Axis:   -39 Text Interpretation:  Sinus tachycardia Abnormal R-wave progression, late  transition Left ventricular hypertrophy Borderline T abnormalities,  inferior leads Borderline prolonged QT interval Abnormal ekg Confirmed by  Gerhard MunchLOCKWOOD, Zeddie Njie  MD ([id number]) on 05/03/2016 1:05:01 PM     Patient's  initial vitals notable for tachycardia, rate 150, sinus tach abnormal Tachypnea, and fever. With substantial anxiety, the patient received Ativan in addition to supplemental oxygen, multiple breathing treatments.   2:50 PM Patient substantially better, though she remains mildly tachycardic, tachypneic, she is now speaking much more clearly, respiratory distress has improved. Initial lactic acidosis was addressed with empiric fluids, antibiotics, and the patient received Tylenol as well.  MDM   Final diagnoses:  Atypical pneumonia  Patient presents in respiratory distress. Here the patient is initially febrile, tachycardic, tachypneic, but not hypotensive. Patient does have mild lactic acidosis, but no leukocytosis. Patient's evaluation consistent with atypical pneumonia, concern for sepsis. After initiation of antibiotics, fluids, Tylenol, breathing treatments the patient was substantially improved.  Gerhard Munchobert Axle Parfait, MD 05/03/16 1451

## 2016-05-03 NOTE — H&P (Signed)
Date: 05/03/2016               Patient Name:  Brenda Mcguire MRN: [id number]  DOB: 1961/10/08 Age / Sex: 55 y.o., female   PCP: Lenell Antuhao P Le, DO         Medical Service: Internal Medicine Teaching Service         Attending Physician: Dr. Inez CatalinaEmily B Mullen, MD    First Contact: Dr. Gwynn BurlyAndrew Versie Fleener Pager: [phone]  Second Contact: Dr. Hyacinth Meekerasrif Ahmed Pager: [phone]       After Hours (After 5p/  First Contact Pager: [phone]  weekends / holidays): Second Contact Pager: [phone]   Chief Complaint: Shortness of Breath, Fever  History of Present Illness: Brenda Mcguire is a 55 y.o. woman with past medical history of Behcet's disease, seizure disorder (last episode more than 18 months ago, hyperlipidemia, and supraventricular tachycardia who presents with 3 days of worsening cold-like symptoms, fever, and shortness of breath.  Patient reports symptoms began on Friday when she noticed productive cough (thick, yellow sputum), sneezing, headache, and myalgias.  On Saturday, she reports were fever at home was 104.2.   Today, she reports her fever was 102 at home and she began to develop acute onset shortness of breath.  She reports a history of pneumonia 2 years ago with similar symptoms except the SOB is new.  She reports a co-worker was recently sick with the cold.  She has tried Nyquil and Tylenol at home with little to no relief.  She also complains of fatigue, pleuritic chest pain, and anorexia.  She denies lightheadedness, syncope, palpitations, nausea, vomiting, diarrhea, dysuria, abdominal pain, lower extremity edema.  On presentation to the ED, patient was tachycardic (rates 150), febrile to 101.3, and tachypneic.   She was normotensive.  She received breathing treatments and Ativan for anxiety.  A portable CXR was read as asymmetric pulmonary edema versus atypical infection and she was started on azithromycin and ceftriaxone.  Lactic acid was elevated to 3.6 and she was started on IV fluids.  She  reported feeling much better after breathing treatments and IV fluids.  Social Hx: she denies EtOH, tobacco, or drug use.  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  She is widowed.  PMHx: Behcets, seizure disorder, HLD, SVT.  She denies thyroid disease history  FHx: heart disease (reports numerous relatives that have passed away at early age from MI)  Meds: Current Facility-Administered Medications  Medication Dose Route Frequency Provider Last Rate Last Dose  . albuterol (PROVENTIL) (2.5 MG/3ML) 0.083% nebulizer solution 2.5 mg  2.5 mg Nebulization Q4H PRN Gerhard Munchobert Lockwood, MD      . aspirin tablet 81 mg  81 mg Oral q morning - 10a Tasrif Ahmed, MD      . atorvastatin (LIPITOR) tablet 40 mg  40 mg Oral Daily Tasrif Ahmed, MD      . Melene Muller[START ON 05/04/2016] azithromycin (ZITHROMAX) 250 mg in dextrose 5 % 125 mL IVPB  250 mg Intravenous Q24H Tasrif Ahmed, MD      . azithromycin (ZITHROMAX) 500 mg in dextrose 5 % 250 mL IVPB  500 mg Intravenous Once Gerhard Munchobert Lockwood, MD 250 mL/hr at 05/03/16 1446 500 mg at 05/03/16 1446  . benzonatate (TESSALON) capsule 100 mg  100 mg Oral TID PRN Tasrif Ahmed, MD      . cefTRIAXone (ROCEPHIN) 1 g in dextrose 5 % 50 mL IVPB  1 g Intravenous Q24H Tasrif Ahmed, MD      .  divalproex (DEPAKOTE) DR tablet 500 mg  500 mg Oral Q8H Tasrif Ahmed, MD      . enoxaparin (LOVENOX) injection 40 mg  40 mg Subcutaneous Q24H Tasrif Ahmed, MD      . gabapentin (NEURONTIN) capsule 900 mg  900 mg Oral TID Tasrif Ahmed, MD      . hydroxychloroquine (PLAQUENIL) tablet 200 mg  200 mg Oral BID Tasrif Ahmed, MD      . levETIRAcetam (KEPPRA) tablet 1,000 mg  1,000 mg Oral BID Tasrif Ahmed, MD      . potassium chloride SA (K-DUR,KLOR-CON) CR tablet 40 mEq  40 mEq Oral Once Tasrif Ahmed, MD      . propranolol (INDERAL) tablet 10 mg  10 mg Oral QID Tasrif Ahmed, MD      . topiramate (TOPAMAX) tablet 100 mg  100 mg Oral Daily Tasrif Ahmed, MD       Current Outpatient  Prescriptions  Medication Sig Dispense Refill  . aspirin 81 MG tablet Take 81 mg by mouth every morning.     Marland Kitchen atorvastatin (LIPITOR) 40 MG tablet Take 1 tablet (40 mg total) by mouth daily. 31 tablet 11  . divalproex (DEPAKOTE) 250 MG DR tablet Take 2 tablets 3 times daily 540 tablet 0  . gabapentin (NEURONTIN) 300 MG capsule TAKE 3 CAPSULES (900 MG TOTAL) BY MOUTH 3 (THREE) TIMES DAILY. 270 capsule 2  . hydroxychloroquine (PLAQUENIL) 200 MG tablet Take 1 tablet (200 mg total) by mouth 2 (two) times daily. 60 tablet 5  . levETIRAcetam (KEPPRA) 500 MG tablet Take 2 tablets 2 times daily 360 tablet 0  . Pseudoeph-Doxylamine-DM-APAP (NYQUIL PO) Take 1 tablet by mouth 3 (three) times daily as needed (cold).    . topiramate (TOPAMAX) 100 MG tablet Take 1 tablet (100 mg total) by mouth daily. 30 tablet 6  . zolpidem (AMBIEN) 10 MG tablet TAKE 1 TAB BY MOUTH AT BEDTIME AS NEEDED FOR SLEEP 30 tablet 0  . ALPRAZolam (XANAX) 0.5 MG tablet TAKE 1 TAB BYMOUTH 3 TIMES A DAY AS NEEDED (Patient not taking: Reported on 05/03/2016) 90 tablet 0  . amitriptyline (ELAVIL) 25 MG tablet TAKE 1 TABLET (25 MG TOTAL) BY MOUTH AT BEDTIME. (Patient not taking: Reported on 05/03/2016) 30 tablet 5  . escitalopram (LEXAPRO) 10 MG tablet Take 1 tablet (10 mg total) by mouth daily. (Patient not taking: Reported on 01/30/2016) 30 tablet 0  . propranolol (INDERAL) 10 MG tablet Take 1 tablet (10 mg total) by mouth 4 (four) times daily as needed. 60 tablet 6    Allergies: Allergies as of 05/03/2016 - Review Complete 05/03/2016  Allergen Reaction Noted  . Hepatitis b vaccine Hives and Shortness Of Breath 03/02/2013  . Imitrex [sumatriptan] Other (See Comments) 03/02/2013  . Iohexol Anaphylaxis, Hives, and Shortness Of Breath 03/02/2013  . Contrast media [iodinated diagnostic agents]  10/12/2015  . Codeine Itching and Rash 03/02/2013   Past Medical History  Diagnosis Date  . GERD (gastroesophageal reflux disease)   .  Hyperlipidemia   . History of blood transfusion   . Chronic headaches   . Phlebitis   . History of colonic polyps   . Behcet's disease (HCC)     has port-a-cath  . Tonic clonic seizures (HCC)     Pseudo-Seizure  . Anxiety     "situational stress"  . Kidney stones 4098,1191  . Myocardial infarction (HCC)   . Arthritis     Rheumatoid   Past Surgical History  Procedure Laterality Date  .  Cholecystectomy    . Tonsillectomy  1970  . Appendectomy  1975  . Lumbar fusion  1999    L5-S1  . Abdominal hysterectomy  1988  . Portacath placement Left 2008  . Stone extraction with basket    . Back surgery      L5-S1 ALIF  . Adenoidectomy  at 55 years old  . Oophorectomy  1987  . Radiology with anesthesia N/A 10/24/2014    Procedure: RADIOLOGY WITH ANESTHESIA;  Surgeon: Medication Radiologist, MD;  Location: [facility] OR;  Service: Radiology;  Laterality: N/A;   Family History  Problem Relation Age of Onset  . Diabetes Mother   . Hyperlipidemia Mother   . Stroke Mother   . Hypertension Mother   . Colon cancer Mother   . Breast cancer Mother   . Early death Father   . Heart attack Father   . Heart disease Father   . Hyperlipidemia Father   . COPD Sister   . Heart disease Maternal Grandmother   . Heart disease Maternal Grandfather   . Heart disease Paternal Grandmother   . Heart disease Paternal Grandfather    Social History   Social History  . Marital Status: Widowed    Spouse Name: N/A  . Number of Children: 2  . Years of Education: 14   Occupational History  . Nurse     [employer]  .     Social History Main Topics  . Smoking status: Never Smoker   . Smokeless tobacco: Never Used  . Alcohol Use: No  . Drug Use: No  . Sexual Activity: Yes    Birth Control/ Protection: Surgical   Other Topics Concern  . Not on file   Social History Narrative   Lives with husband.  They have 2 grown children.   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  Physical Exam: Blood pressure 115/69, pulse 112, temperature 101.3 F (38.5 C), temperature source Oral, resp. rate 16, SpO2 97 %. Physical Exam  Constitutional: She is oriented to person, place, and time. She appears well-developed and well-nourished. No distress.  HENT:  Head: Normocephalic and atraumatic.  Eyes: EOM are normal. Pupils are equal, round, and reactive to light.  Neck: Normal range of motion. No JVD present.  Cardiovascular:  Tachycardic, regular rhythm, no murmurs, intact pulses. Port-a-cath in place left chest wall.  Looks clean  Pulmonary/Chest: Effort normal. She has wheezes (diffuse wheezes throughout). She exhibits no tenderness.  Abdominal: Soft. Bowel sounds are normal. She exhibits no distension. There is no tenderness.  Musculoskeletal: Normal range of motion. She exhibits no edema.  Neurological: She is alert and oriented to person, place, and time. No cranial nerve deficit.  Skin: Skin is warm and dry.  Tattoos on lower extremities  Psychiatric: She has a normal mood and affect.    Lab results: Basic Metabolic Panel:  Recent Labs  16/10/96 1248  NA 136  K 3.4*  CL 104  CO2 19*  GLUCOSE 150*  BUN 11  CREATININE 0.83  CALCIUM 9.0   Liver Function Tests:  Recent Labs  05/03/16 1248  AST 23  ALT 19  ALKPHOS 98  BILITOT 0.5  PROT 7.7  ALBUMIN 3.8   CBC:  Recent Labs  05/03/16 1248  WBC 7.3  NEUTROABS 4.8  HGB 12.7  HCT 37.8  MCV 88.9  PLT 209   Urine Drug Screen: Drugs of Abuse     Component Value Date/Time   LABOPIA POSITIVE* 10/15/2015 1700   LABOPIA POSITIVE 05/28/2014 1349   LABOPIA NEGATIVE 11/11/2013 0600   COCAINSCRNUR NONE DETECTED 10/15/2015 1700   COCAINSCRNUR NEGATIVE 05/28/2014 1349   COCAINSCRNUR NEGATIVE 11/11/2013 0600   LABBENZ POSITIVE*  10/15/2015 1700   LABBENZ POSITIVE 05/28/2014 1349   LABBENZ POSITIVE* 11/11/2013 0600   AMPHETMU NONE DETECTED 10/15/2015 1700   AMPHETMU NEGATIVE 05/28/2014 1349   AMPHETMU NEGATIVE 11/11/2013 0600   THCU NONE DETECTED 10/15/2015 1700   THCU NEGATIVE 05/28/2014 1349   LABBARB NONE DETECTED 10/15/2015 1700   LABBARB NEGATIVE 05/28/2014 1349    Urinalysis:  Recent Labs  05/03/16 1428  COLORURINE YELLOW  LABSPEC 1.024  PHURINE 6.0  GLUCOSEU NEGATIVE  HGBUR TRACE*  BILIRUBINUR NEGATIVE  KETONESUR 15*  PROTEINUR NEGATIVE  NITRITE NEGATIVE  LEUKOCYTESUR SMALL*    Imaging results:  Dg Chest Port 1 View  05/03/2016  CLINICAL DATA:  Worsening shortness of breath. Fever and headache. History of myocardial infarction. EXAM: PORTABLE CHEST 1 VIEW COMPARISON:  10/15/2015; 03/16/2015 FINDINGS: Grossly unchanged cardiac silhouette and mediastinal contours given reduced lung volumes. Stable position of support apparatus. Overall improved aeration of the lungs though there are persistent left mid and lower lung ill-defined interstitial opacities. No pleural effusion or pneumothorax. No acute osseous abnormalities. IMPRESSION: Improved aeration of lungs with persistent ill-defined heterogeneous opacities within the left mid and lower lung - differential considerations are broad though include asymmetric pulmonary edema versus atypical infection. A follow-up chest radiograph in 3 to 4 weeks after treatment is recommended to ensure resolution. Electronically Signed   By: Simonne Come M.D.   On: 05/03/2016 14:02    Other results: EKG: Sinus tachycardia. Left ventricular hypertrophy. Borderline T abnormalities, inferior leads. Borderline prolonged QT interval  Assessment & Plan by Problem: Active Problems:   Atypical pneumonia  Febrile Illness: with cough, myalgias, sneezing and headaches.  Symptoms are consistent with a viral-like illness, however, bacterial infection cannot completely be  excluded.  She meets 3/4 SIRS criteria given her fever, tachycardia, and tachypnea.  She has no leukocytosis.  Most likely source at this point would be atypical pulmonary infection (viral vs bacterial PNA) vs viral bronchitis vs influenza.  She reports feeling much better after receiving breathing treatments and IV fluids.  She has some pleuritic chest pain associated with coughing and deep inspiration.  She also had diffuse wheezes on exam. - albuterol nebs Q4H prn - benzonatate  TID prn for cough - IV fluids - blood cultures x 2 - continue azithromycin and ceftriaxone for now for CAP coverage - supplemental oxygen as needed - repeat lactic acid - 2-view CXR - check procalcitonin - check influenza - check HIV  Hypokalemia: mild.  3.4 on admission - replace - check magnesium  Hx SVT: tachycardic on admission.  Follows with Dr. Elease Hashimoto as an outpatient.  Last office visit was Nov 2016.  She was started on Propanolol to take PRN with planned follow up in 3 months.  She has yet to follow up and has had no further work-up of her SVT - continue Propanolol - repeat EKG - monitor on telemetry - check TSH  Hx HLD: strong family history of CAD with her father died of an MI at age 13 and her mom had an MI at age 32.  Never had a  cath or stress test.  Had a 2D echo in 2015 with LV EF 55-60%, no diastolic dysfunction and normal wall motion.  She reports a previous history of NSTEMI in setting of a seizure.  No current typical chest pain symptoms. 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  Currently on Plaquenil for this. - continue Plaquenil  Hx Seizure Disorder: last seizure activity was reported by her to be more than 18 months ago. - continue Keppra, Depakote, Gabapentin, and Topamax  Diet: heart healthy  DVT PPx: Lovenox  CODE: FULL  Dispo: Disposition is deferred at this time, awaiting  improvement of current medical problems. Anticipated discharge in approximately 1-2 day(s).   The patient does have a current PCP (Thao P Le, DO) and does not need an [facility] hospital follow-up appointment after discharge.  The patient does not have transportation limitations that hinder transportation to clinic appointments.  Signed: Gwynn Burly, DO 05/03/2016, 3:38 PM

## 2016-05-04 DIAGNOSIS — J189 Pneumonia, unspecified organism: Secondary | ICD-10-CM | POA: Diagnosis not present

## 2016-05-04 DIAGNOSIS — J208 Acute bronchitis due to other specified organisms: Secondary | ICD-10-CM | POA: Diagnosis not present

## 2016-05-04 LAB — CBC
HEMATOCRIT: 30.7 % — AB (ref 36.0–46.0)
HEMOGLOBIN: 9.8 g/dL — AB (ref 12.0–15.0)
MCH: 30 pg (ref 26.0–34.0)
MCHC: 31.9 g/dL (ref 30.0–36.0)
MCV: 93.9 fL (ref 78.0–100.0)
Platelets: 169 10*3/uL (ref 150–400)
RBC: 3.27 MIL/uL — AB (ref 3.87–5.11)
RDW: 13.3 % (ref 11.5–15.5)
WBC: 4.3 10*3/uL (ref 4.0–10.5)

## 2016-05-04 LAB — PROCALCITONIN

## 2016-05-04 LAB — BASIC METABOLIC PANEL
ANION GAP: 5 (ref 5–15)
BUN: 8 mg/dL (ref 6–20)
CALCIUM: 7.9 mg/dL — AB (ref 8.9–10.3)
CO2: 21 mmol/L — ABNORMAL LOW (ref 22–32)
Chloride: 115 mmol/L — ABNORMAL HIGH (ref 101–111)
Creatinine, Ser: 0.58 mg/dL (ref 0.44–1.00)
Glucose, Bld: 112 mg/dL — ABNORMAL HIGH (ref 65–99)
POTASSIUM: 3.7 mmol/L (ref 3.5–5.1)
SODIUM: 141 mmol/L (ref 135–145)

## 2016-05-04 LAB — LACTIC ACID, PLASMA
LACTIC ACID, VENOUS: 0.7 mmol/L (ref 0.5–2.0)
LACTIC ACID, VENOUS: 1 mmol/L (ref 0.5–2.0)

## 2016-05-04 LAB — URINE CULTURE

## 2016-05-04 LAB — HIV ANTIBODY (ROUTINE TESTING W REFLEX): HIV Screen 4th Generation wRfx: NONREACTIVE

## 2016-05-04 LAB — INFLUENZA PANEL BY PCR (TYPE A & B)
H1N1FLUPCR: NOT DETECTED
Influenza A By PCR: NEGATIVE
Influenza B By PCR: NEGATIVE

## 2016-05-04 LAB — MAGNESIUM: Magnesium: 1.7 mg/dL (ref 1.7–2.4)

## 2016-05-04 LAB — T4, FREE: FREE T4: 0.98 ng/dL (ref 0.61–1.12)

## 2016-05-04 LAB — TSH: TSH: 0.325 u[IU]/mL — ABNORMAL LOW (ref 0.350–4.500)

## 2016-05-04 MED ORDER — ALBUTEROL SULFATE (2.5 MG/3ML) 0.083% IN NEBU
2.5000 mg | INHALATION_SOLUTION | RESPIRATORY_TRACT | Status: DC
  Administered 2016-05-04 (×2): 2.5 mg via RESPIRATORY_TRACT
  Filled 2016-05-04 (×2): qty 3

## 2016-05-04 MED ORDER — HEPARIN SOD (PORK) LOCK FLUSH 100 UNIT/ML IV SOLN: 500.0000 [IU] | INTRAVENOUS | Status: DC

## 2016-05-04 MED ORDER — BENZONATATE 100 MG PO CAPS: 100.0000 mg | ORAL_CAPSULE | Freq: Three times a day (TID) | ORAL | Status: AC | PRN

## 2016-05-04 MED ORDER — ALBUTEROL SULFATE HFA 108 (90 BASE) MCG/ACT IN AERS: 2.0000 | INHALATION_SPRAY | Freq: Four times a day (QID) | RESPIRATORY_TRACT | Status: AC | PRN

## 2016-05-04 MED ORDER — HEPARIN SOD (PORK) LOCK FLUSH 100 UNIT/ML IV SOLN
500.0000 [IU] | INTRAVENOUS | Status: DC | PRN
  Administered 2016-05-04: 500 [IU]
  Filled 2016-05-04: qty 5

## 2016-05-04 MED ORDER — ALBUTEROL SULFATE (2.5 MG/3ML) 0.083% IN NEBU: 2.5000 mg | INHALATION_SOLUTION | Freq: Four times a day (QID) | RESPIRATORY_TRACT | Status: DC | PRN

## 2016-05-04 MED ORDER — AZITHROMYCIN 250 MG PO TABS: 250.0000 mg | ORAL_TABLET | Freq: Every day | ORAL | Status: AC

## 2016-05-04 MED ORDER — PROPRANOLOL HCL 10 MG PO TABS
10.0000 mg | ORAL_TABLET | Freq: Three times a day (TID) | ORAL | Status: DC | PRN
Start: 2016-05-04 — End: 2016-05-04
  Filled 2016-05-04: qty 1

## 2016-05-04 MED ORDER — ALPRAZOLAM 0.5 MG PO TABS
0.5000 mg | ORAL_TABLET | Freq: Once | ORAL | Status: AC
  Administered 2016-05-04: 0.5 mg via ORAL
  Filled 2016-05-04: qty 1

## 2016-05-04 NOTE — Progress Notes (Signed)
Patient discharge teaching given, including activity, diet, follow-up appoints, and medications. Patient verbalized understanding of all discharge instructions. Portacath de-accessed. Vitals are stable. Skin is intact except as charted in most recent assessments. Pt to be escorted out by NT, to be driven home by family.  Peri MarisAndrew Juliyah Mergen, MBA, BSN, RN

## 2016-05-04 NOTE — Discharge Summary (Signed)
Name: Brenda Mcguire MRN: [id number] DOB: [birth year] 55 y.o. PCP: Lenell Antu, DO  Date of Admission: 05/03/2016 11:39 AM Date of Discharge: 05/04/2016 Attending Physician: Inez Catalina, MD  Discharge Diagnosis: 1. Atypical PNA  2. Viral Bronchitis   Active Problems:   Behcet's disease (HCC)   GERD (gastroesophageal reflux disease)   Acute respiratory failure (HCC)   Seizure (HCC)   Hyperlipidemia   SVT (supraventricular tachycardia) (HCC)   Atypical pneumonia  Discharge Medications:   Medication List    TAKE these medications        albuterol 108 (90 Base) MCG/ACT inhaler  Commonly known as:  PROVENTIL HFA;VENTOLIN HFA  Inhale 2 puffs into the lungs every 6 (six) hours as needed for wheezing or shortness of breath.     ALPRAZolam 0.5 MG tablet  Commonly known as:  XANAX  TAKE 1 TAB BYMOUTH 3 TIMES A DAY AS NEEDED     amitriptyline 25 MG tablet  Commonly known as:  ELAVIL  TAKE 1 TABLET (25 MG TOTAL) BY MOUTH AT BEDTIME.     aspirin 81 MG tablet  Take 81 mg by mouth every morning.     atorvastatin 40 MG tablet  Commonly known as:  LIPITOR  Take 1 tablet (40 mg total) by mouth daily.     azithromycin 250 MG tablet  Commonly known as:  ZITHROMAX  Take 1 tablet (250 mg total) by mouth daily. X 4 days starting 5/23  Start taking on:  05/05/2016     benzonatate 100 MG capsule  Commonly known as:  TESSALON  Take 1 capsule (100 mg total) by mouth 3 (three) times daily as needed for cough.     divalproex 250 MG DR tablet  Commonly known as:  DEPAKOTE  Take 2 tablets 3 times daily     escitalopram 10 MG tablet  Commonly known as:  LEXAPRO  Take 1 tablet (10 mg total) by mouth daily.     gabapentin 300 MG capsule  Commonly known as:  NEURONTIN  TAKE 3 CAPSULES (900 MG TOTAL) BY MOUTH 3 (THREE) TIMES DAILY.     hydroxychloroquine 200 MG tablet  Commonly known as:  PLAQUENIL  Take 1 tablet (200 mg total) by mouth 2 (two) times daily.     levETIRAcetam 500 MG  tablet  Commonly known as:  KEPPRA  Take 2 tablets 2 times daily     NYQUIL PO  Take 1 tablet by mouth 3 (three) times daily as needed (cold).     propranolol 10 MG tablet  Commonly known as:  INDERAL  Take 1 tablet (10 mg total) by mouth 4 (four) times daily as needed.     topiramate 100 MG tablet  Commonly known as:  TOPAMAX  Take 1 tablet (100 mg total) by mouth daily.     zolpidem 10 MG tablet  Commonly known as:  AMBIEN  TAKE 1 TAB BY MOUTH AT BEDTIME AS NEEDED FOR SLEEP        Disposition and follow-up:   [name] was discharged from [facility] in Stable condition.    1.  At the hospital follow up visit please address:  - have symptoms resolved? - completion of Z-pack?  2.  Labs / imaging needed at time of follow-up: CBC  3.  Pending labs/ test needing follow-up: free T3  Follow-up Appointments:     Follow-up Information    Schedule an appointment as soon as possible for a visit with  Thao Phuong Le, DO.   Specialty:  Family Medicine   Contact information:   [address] [place] [address] [phone]       Discharge Instructions:   Consultations:    Procedures Performed:  Dg Chest 2 View  05/03/2016  CLINICAL DATA:  Acute onset of productive cough and generalized chest pain. Shortness of breath. Initial encounter. EXAM: CHEST  2 VIEW COMPARISON:  Chest radiograph performed earlier today at 12:44 p.m. FINDINGS: The lungs are mildly hypoexpanded. Mildly increased central lung markings may reflect a mild infectious process or possibly mild interstitial edema. No pleural effusion or pneumothorax is seen. The cardiomediastinal silhouette is normal in size. No acute osseous abnormalities are identified. A left-sided chest port is noted ending about the distal SVC. IMPRESSION: Lungs mildly hypoexpanded. Mildly increased central lung markings may reflect a mild infectious process or possibly mild interstitial edema. Electronically  Signed   By: Roanna Raider M.D.   On: 05/03/2016 21:51   Dg Chest Port 1 View  05/03/2016  CLINICAL DATA:  Worsening shortness of breath. Fever and headache. History of myocardial infarction. EXAM: PORTABLE CHEST 1 VIEW COMPARISON:  10/15/2015; 03/16/2015 FINDINGS: Grossly unchanged cardiac silhouette and mediastinal contours given reduced lung volumes. Stable position of support apparatus. Overall improved aeration of the lungs though there are persistent left mid and lower lung ill-defined interstitial opacities. No pleural effusion or pneumothorax. No acute osseous abnormalities. IMPRESSION: Improved aeration of lungs with persistent ill-defined heterogeneous opacities within the left mid and lower lung - differential considerations are broad though include asymmetric pulmonary edema versus atypical infection. A follow-up chest radiograph in 3 to 4 weeks after treatment is recommended to ensure resolution. Electronically Signed   By: Simonne Come M.D.   On: 05/03/2016 14:02    Admission HPI:  [name] is a 55 y.o. woman with past medical history of Behcet's disease, seizure disorder (last episode more than 18 months ago, hyperlipidemia, and supraventricular tachycardia who presents with 3 days of worsening cold-like symptoms, fever, and shortness of breath. Patient reports symptoms began on Friday when she noticed productive cough (thick, yellow sputum), sneezing, headache, and myalgias. On Saturday, she reports were fever at home was 104.2. Today, she reports her fever was 102 at home and she began to develop acute onset shortness of breath. She reports a history of pneumonia 2 years ago with similar symptoms except the SOB is new. She reports a co-worker was recently sick with the cold. She has tried Nyquil and Tylenol at home with little to no relief. She also complains of fatigue, pleuritic chest pain, and anorexia. She denies lightheadedness, syncope, palpitations, nausea, vomiting,  diarrhea, dysuria, abdominal pain, lower extremity edema.  On presentation to the ED, patient was tachycardic (rates 150), febrile to 101.3, and tachypneic. She was normotensive. She received breathing treatments and Ativan for anxiety. A portable CXR was read as asymmetric pulmonary edema versus atypical infection and she was started on azithromycin and ceftriaxone. Lactic acid was elevated to 3.6 and she was started on IV fluids. She reported feeling much better after breathing treatments and IV fluids.  Hospital Course by problem list: Active Problems:   Behcet's disease (HCC)   GERD (gastroesophageal reflux disease)   Acute respiratory failure (HCC)   Seizure (HCC)   Hyperlipidemia   SVT (supraventricular tachycardia) (HCC)   Atypical pneumonia   Atypical pneumonia/bronchitis: her clinical course seems most consistent with a viral pneumonia and mild lactic acidosis that improved quickly likely due  to tachycardia and dehydration.  Her other symptoms improved with albuterol nebulizers and appropriate cough suppressants.  We de-escalated her antibiotics and discharged her only on azithromycin due to concern for a potential atypical pneumonia.  We provided an albuterol inhaler and cough medicine at discharge.  Hypokalemia: repleted  History of SVT:  We resumed her home propanolol, monitored her on telemetry with no issues observed.  A TSH was checked and found to be low.  We also checked a free T4 (normal) and a free T3.  T3 was pending at time of discharge but can be followed up as an outpatient.  Discharge Vitals:   BP 93/59 mmHg  Pulse 56  Temp(Src) 98.1 F (36.7 C) (Oral)  Resp 18  Ht 5' 10.5" (1.791 m)  Wt 182 lb 5.1 oz (82.7 kg)  BMI 25.78 kg/m2  SpO2 100%  Discharge Labs:  Results for orders placed or performed during the hospital encounter of 05/03/16 (from the past 24 hour(s))  Influenza panel by PCR (type A & B, H1N1)     Status: None   Collection Time: 05/03/16   8:56 PM  Result Value Ref Range   Influenza A By PCR NEGATIVE NEGATIVE   Influenza B By PCR NEGATIVE NEGATIVE   H1N1 flu by pcr NOT DETECTED NOT DETECTED  TSH     Status: Abnormal   Collection Time: 05/04/16 12:00 AM  Result Value Ref Range   TSH 0.325 (L) 0.350 - 4.500 uIU/mL  HIV antibody     Status: None   Collection Time: 05/04/16 12:00 AM  Result Value Ref Range   HIV Screen 4th Generation wRfx Non Reactive Non Reactive  Magnesium     Status: None   Collection Time: 05/04/16 12:00 AM  Result Value Ref Range   Magnesium 1.7 1.7 - 2.4 mg/dL  Lactic acid, plasma     Status: None   Collection Time: 05/04/16 12:00 AM  Result Value Ref Range   Lactic Acid, Venous 1.0 0.5 - 2.0 mmol/L  Procalcitonin - Baseline     Status: None   Collection Time: 05/04/16 12:00 AM  Result Value Ref Range   Procalcitonin <0.10 ng/mL  Basic metabolic panel     Status: Abnormal   Collection Time: 05/04/16  2:48 AM  Result Value Ref Range   Sodium 141 135 - 145 mmol/L   Potassium 3.7 3.5 - 5.1 mmol/L   Chloride 115 (H) 101 - 111 mmol/L   CO2 21 (L) 22 - 32 mmol/L   Glucose, Bld 112 (H) 65 - 99 mg/dL   BUN 8 6 - 20 mg/dL   Creatinine, Ser 1.61 0.44 - 1.00 mg/dL   Calcium 7.9 (L) 8.9 - 10.3 mg/dL   GFR calc non Af Amer >60 >60 mL/min   GFR calc Af Amer >60 >60 mL/min   Anion gap 5 5 - 15  CBC     Status: Abnormal   Collection Time: 05/04/16  2:48 AM  Result Value Ref Range   WBC 4.3 4.0 - 10.5 K/uL   RBC 3.27 (L) 3.87 - 5.11 MIL/uL   Hemoglobin 9.8 (L) 12.0 - 15.0 g/dL   HCT 09.6 (L) 04.5 - 40.9 %   MCV 93.9 78.0 - 100.0 fL   MCH 30.0 26.0 - 34.0 pg   MCHC 31.9 30.0 - 36.0 g/dL   RDW 81.1 91.4 - 78.2 %   Platelets 169 150 - 400 K/uL  Lactic acid, plasma     Status: None  Collection Time: 05/04/16  2:48 AM  Result Value Ref Range   Lactic Acid, Venous 0.7 0.5 - 2.0 mmol/L  T4, free     Status: None   Collection Time: 05/04/16 10:15 AM  Result Value Ref Range   Free T4 0.98 0.61 - 1.12  ng/dL    Signed: Gwynn BurlyAndrew Janeya Deyo, DO 05/04/2016, 2:55 PM    Services Ordered on Discharge: none Equipment Ordered on Discharge: none

## 2016-05-04 NOTE — Progress Notes (Signed)
Subjective: Patient seen and examined this morning on rounds.  No acute events overnight since admission. She states that she is feeling better this morning than yesterday.  Continues to have some cough but denies chest pain or palpitations.  No SOB when supplemental oxygen was turned to off.  Objective: Vital signs in last 24 hours: Filed Vitals:   05/03/16 2029 05/04/16 0459 05/04/16 0920 05/04/16 1211  BP: 117/75 105/63 93/59   Pulse: 96 60 56   Temp: 98.8 F (37.1 C) 97.8 F (36.6 C) 98.1 F (36.7 C)   TempSrc:   Oral   Resp: 20 20 18    Height:      Weight:      SpO2: 95% 100% 100% 100%   Weight change:   Intake/Output Summary (Last 24 hours) at 05/04/16 1344 Last data filed at 05/04/16 1000  Gross per 24 hour  Intake   1305 ml  Output      0 ml  Net   1305 ml   General: resting in bed, alert, no distress HEENT: EOMI, no scleral icterus Cardiac: RRR, no rubs, murmurs or gallops Pulm: wheezing throughout lung fields but moving decent air and improved from previous exam Abd: soft, nontender, nondistended Ext: warm and well perfused, no pedal edema Neuro: alert and oriented X3, cranial nerves II-XII grossly intact  Lab Results: Basic Metabolic Panel:  Recent Labs Lab 05/03/16 1248 05/04/16 05/04/16 0248  NA 136  --  141  K 3.4*  --  3.7  CL 104  --  115*  CO2 19*  --  21*  GLUCOSE 150*  --  112*  BUN 11  --  8  CREATININE 0.83  --  0.58  CALCIUM 9.0  --  7.9*  MG  --  1.7  --    Liver Function Tests:  Recent Labs Lab 05/03/16 1248  AST 23  ALT 19  ALKPHOS 98  BILITOT 0.5  PROT 7.7  ALBUMIN 3.8   No results for input(s): LIPASE, AMYLASE in the last 168 hours. No results for input(s): AMMONIA in the last 168 hours. CBC:  Recent Labs Lab 05/03/16 1248 05/04/16 0248  WBC 7.3 4.3  NEUTROABS 4.8  --   HGB 12.7 9.8*  HCT 37.8 30.7*  MCV 88.9 93.9  PLT 209 169   Thyroid Function Tests:  Recent Labs Lab 05/04/16 05/04/16 1015  TSH  0.325*  --   FREET4  --  0.98   Urine Drug Screen: Drugs of Abuse     Component Value Date/Time   LABOPIA POSITIVE* 10/15/2015 1700   LABOPIA POSITIVE 05/28/2014 1349   LABOPIA NEGATIVE 11/11/2013 0600   COCAINSCRNUR NONE DETECTED 10/15/2015 1700   COCAINSCRNUR NEGATIVE 05/28/2014 1349   COCAINSCRNUR NEGATIVE 11/11/2013 0600   LABBENZ POSITIVE* 10/15/2015 1700   LABBENZ POSITIVE 05/28/2014 1349   LABBENZ POSITIVE* 11/11/2013 0600   AMPHETMU NONE DETECTED 10/15/2015 1700   AMPHETMU NEGATIVE 05/28/2014 1349   AMPHETMU NEGATIVE 11/11/2013 0600   THCU NONE DETECTED 10/15/2015 1700   THCU NEGATIVE 05/28/2014 1349   LABBARB NONE DETECTED 10/15/2015 1700   LABBARB NEGATIVE 05/28/2014 1349    Urinalysis:  Recent Labs Lab 05/03/16 1428  COLORURINE YELLOW  LABSPEC 1.024  PHURINE 6.0  GLUCOSEU NEGATIVE  HGBUR TRACE*  BILIRUBINUR NEGATIVE  KETONESUR 15*  PROTEINUR NEGATIVE  NITRITE NEGATIVE  LEUKOCYTESUR SMALL*    Micro Results: Recent Results (from the past 240 hour(s))  Blood Culture (routine x 2)     Status:  None (Preliminary result)   Collection Time: 05/03/16 12:51 PM  Result Value Ref Range Status   Specimen Description BLOOD PORTA CATH  Final   Special Requests BOTTLES DRAWN AEROBIC AND ANAEROBIC 5CC  Final   Culture NO GROWTH < 24 HOURS  Final   Report Status PENDING  Incomplete  Blood Culture (routine x 2)     Status: None (Preliminary result)   Collection Time: 05/03/16  1:17 PM  Result Value Ref Range Status   Specimen Description BLOOD RIGHT ANTECUBITAL  Final   Special Requests BOTTLES DRAWN AEROBIC ONLY 5CC  Final   Culture NO GROWTH < 24 HOURS  Final   Report Status PENDING  Incomplete  Urine culture     Status: Abnormal   Collection Time: 05/03/16  2:28 PM  Result Value Ref Range Status   Specimen Description URINE, CLEAN CATCH  Final   Special Requests NONE  Final   Culture MULTIPLE SPECIES PRESENT, SUGGEST RECOLLECTION (A)  Final   Report Status  05/04/2016 FINAL  Final   Studies/Results: Dg Chest 2 View  05/03/2016  CLINICAL DATA:  Acute onset of productive cough and generalized chest pain. Shortness of breath. Initial encounter. EXAM: CHEST  2 VIEW COMPARISON:  Chest radiograph performed earlier today at 12:44 p.m. FINDINGS: The lungs are mildly hypoexpanded. Mildly increased central lung markings may reflect a mild infectious process or possibly mild interstitial edema. No pleural effusion or pneumothorax is seen. The cardiomediastinal silhouette is normal in size. No acute osseous abnormalities are identified. A left-sided chest port is noted ending about the distal SVC. IMPRESSION: Lungs mildly hypoexpanded. Mildly increased central lung markings may reflect a mild infectious process or possibly mild interstitial edema. Electronically Signed   By: Roanna RaiderJeffery  Chang M.D.   On: 05/03/2016 21:51   Dg Chest Port 1 View  05/03/2016  CLINICAL DATA:  Worsening shortness of breath. Fever and headache. History of myocardial infarction. EXAM: PORTABLE CHEST 1 VIEW COMPARISON:  10/15/2015; 03/16/2015 FINDINGS: Grossly unchanged cardiac silhouette and mediastinal contours given reduced lung volumes. Stable position of support apparatus. Overall improved aeration of the lungs though there are persistent left mid and lower lung ill-defined interstitial opacities. No pleural effusion or pneumothorax. No acute osseous abnormalities. IMPRESSION: Improved aeration of lungs with persistent ill-defined heterogeneous opacities within the left mid and lower lung - differential considerations are broad though include asymmetric pulmonary edema versus atypical infection. A follow-up chest radiograph in 3 to 4 weeks after treatment is recommended to ensure resolution. Electronically Signed   By: Simonne ComeJohn  Watts M.D.   On: 05/03/2016 14:02   Medications: I have reviewed the patient's current medications. Scheduled Meds: . albuterol  2.5 mg Nebulization Q4H  . aspirin  81 mg  Oral Daily  . atorvastatin  40 mg Oral q1800  . azithromycin  250 mg Intravenous Q24H  . divalproex  500 mg Oral Q8H  . enoxaparin (LOVENOX) injection  40 mg Subcutaneous Q24H  . gabapentin  900 mg Oral TID  . hydroxychloroquine  200 mg Oral BID  . levETIRAcetam  1,000 mg Oral BID  . topiramate  100 mg Oral Daily   Continuous Infusions:  PRN Meds:.acetaminophen, albuterol, benzonatate, guaiFENesin-dextromethorphan, propranolol, sodium chloride flush Assessment/Plan: Active Problems:   Behcet's disease (HCC)   GERD (gastroesophageal reflux disease)   Acute respiratory failure (HCC)   Seizure (HCC)   Hyperlipidemia   SVT (supraventricular tachycardia) (HCC)   Atypical pneumonia  Atypical PNA vs Viral Illness: initially febrile at admission with  cough, myalgias, and headaches.  Symptoms have improved and seem consistent with viral-like illness (PNA vs bronchitis) or atypical pneumonia based on symptoms and most recent CXR.  Her procalcitonin was normal and her lactic acidosis has improved.  She has been getting nebulizer treatments which have improved her symptoms and has remained afebrile without leukocytosis.  She is saturating at 100% on room air  - albuterol nebs Q4H prn while inpatient - rx inhaler at d/c - benzonatate  TID prn for cough - blood cultures x 2 are no growth - continue azithromycin for 5 days - d/c ceftriaxone  Hypokalemia: Resolved.  Hx SVT: tachycardic on admission. Follows with Dr. Elease Hashimoto as an outpatient. Last office visit was Nov 2016. She was started on Propanolol to take PRN with planned follow up in 3 months. She has yet to follow up and has had no further work-up of her SVT - continue Propanolol - monitor on telemetry - TSH was low, free T4 normal, free T3 pending - outpatient follow up  Diet: heart healthy  DVT PPx: Lovenox  CODE: FULL  Dispo: Home today with PCP follow up  The patient does have a current PCP (Thao P Le, DO) and does  not need an [facility] hospital follow-up appointment after discharge.  The patient does not have transportation limitations that hinder transportation to clinic appointments.  .Services Needed at time of discharge: Y = Yes, Blank = No PT:   OT:   RN:   Equipment:   Other:     LOS: 1 day   Gwynn Burly, DO 05/04/2016, 1:44 PM

## 2016-05-04 NOTE — Progress Notes (Signed)
   05/04/16 1428  Clinical Encounter Type  Visited With Patient;Health care provider  Visit Type Initial;Spiritual support  Referral From Patient;Nurse  Spiritual Encounters  Spiritual Needs Prayer;Emotional  Stress Factors  Patient Stress Factors Family relationships;Health changes   Chaplain responded to a consult. Patient's mom is in the ICU in [facility] with COPD and pneumonia. Chaplain met with patient and offered prayer and support. Chaplain will seek to follow-up. Spiritual care services available as needed.    Jeri Lager, Chaplain 05/04/2016 2:29 PM

## 2016-05-04 NOTE — Discharge Instructions (Signed)
Please use albuterol inhaler every 4-6 hours for the next few days while finishing your antibiotics.  After that, you use it as needed.

## 2016-05-04 NOTE — Progress Notes (Signed)
This RN entered patient's room to complete ambulatory O2 sats, but patient was visibly upset upon assessment. She states her dad died when she was in her early teens, her brother died in his early 6620's, her husband died last year, and now her [facility] and she "can't bury another person." She's very tearful. Spiritual care consult placed and on call chaplain paged. Dr. Earlene PlaterWallace paged for prn anti-anxiety orders. Will continue to monitor.  Leanna BattlesEckelmann, Braylea Brancato Eileen, RN.

## 2016-05-04 NOTE — Progress Notes (Signed)
Pharmacist Student Provided - Patient Medication Education Education Best boyof B1 [employer] Team Teaching Service Patient  Education: Educated the patient on some of current medications, specifically, Keppra and azithromycin. Reviewed side effects with the patient. Answered the patients medication specific questions. These medications are on the patient's current med list and are subject to change upon discharge.  Dola Argyleamika Shalaine Payson  P4 PharmD Candidate

## 2016-05-04 NOTE — Progress Notes (Signed)
Subjective: Brenda Mcguire was seen and examined on morning rounds.  She says she is feeling better than yesterday.  She is currently on 2L of oxygen but did not have any SOB and was satting at 100% when we turned off her oxygen during rounds.  She continues to have a cough.  Denies CP, palpitations, n/v, chills, fever.  We discussed chest x-ray findings with patient and our concern for bronchitis vs. Viral pneumonia. She is amenable to discharge today.    Objective: Vital signs in last 24 hours: Filed Vitals:   05/03/16 1720 05/03/16 2029 05/04/16 0459 05/04/16 0920  BP: 105/67 117/75 105/63 93/59  Pulse: 108 96 60 56  Temp: 99.4 F (37.4 C) 98.8 F (37.1 C) 97.8 F (36.6 C) 98.1 F (36.7 C)  TempSrc: Oral   Oral  Resp: 20 20 20 18   Height: 5' 10.5" (1.791 m)     Weight: 82.7 kg (182 lb 5.1 oz)     SpO2: 98% 95% 100% 100%   Weight change:   Intake/Output Summary (Last 24 hours) at 05/04/16 1053 Last data filed at 05/04/16 0900  Gross per 24 hour  Intake    945 ml  Output      0 ml  Net    945 ml   General: Alert, conversant. NAD Lungs: Wheezing throughout R and L lung fields.  Slight improvement from yesterday. Heart: RRR.  No murmurs Abdomen: No TTP.  Soft, nondistended.  Extremities: Warm, dry.   Lab Results: CBC    Component Value Date/Time   WBC 4.3 05/04/2016 0248   WBC 9.2 10/12/2015 1104   WBC 7.0 01/15/2015 1131   RBC 3.27* 05/04/2016 0248   RBC 4.42 10/12/2015 1104   RBC 3.57* 01/15/2015 1131   HGB 9.8* 05/04/2016 0248   HGB 13.2 10/12/2015 1104   HGB 11.1* 01/15/2015 1131   HCT 30.7* 05/04/2016 0248   HCT 39.2 10/12/2015 1104   HCT 33.2* 01/15/2015 1131   PLT 169 05/04/2016 0248   PLT 281 01/15/2015 1131   MCV 93.9 05/04/2016 0248   MCV 88.6 10/12/2015 1104   MCV 93 01/15/2015 1131   MCH 30.0 05/04/2016 0248   MCH 29.8 10/12/2015 1104   MCH 31.1 01/15/2015 1131   MCHC 31.9 05/04/2016 0248   MCHC 33.7 10/12/2015 1104   MCHC 33.4 01/15/2015 1131   RDW 13.3 05/04/2016 0248   RDW 12.6 01/15/2015 1131   LYMPHSABS 2.0 05/03/2016 1248   LYMPHSABS 2.1 08/15/2014 1000   MONOABS 0.4 05/03/2016 1248   MONOABS 0.6 08/15/2014 1000   EOSABS 0.2 05/03/2016 1248   EOSABS 0.1 08/15/2014 1000   BASOSABS 0.0 05/03/2016 1248   BASOSABS 0.1 08/15/2014 1000    BMP Latest Ref Rng 05/04/2016 05/03/2016 11/12/2015  Glucose 65 - 99 mg/dL 161(W) 960(A) 540(J)  BUN 6 - 20 mg/dL 8 11 16   Creatinine 0.44 - 1.00 mg/dL 8.11 9.14 7.82  Sodium 135 - 145 mmol/L 141 136 139  Potassium 3.5 - 5.1 mmol/L 3.7 3.4(L) 4.2  Chloride 101 - 111 mmol/L 115(H) 104 103  CO2 22 - 32 mmol/L 21(L) 19(L) 24  Calcium 8.9 - 10.3 mg/dL 7.9(L) 9.0 10.0    Lactic acid: 3.66 --> 1.0 --> 0.7  Flu panel: Negative Procalcitonin < 0.1  TSH 0.33 (low)   HIV pending Urine, blood cultures pending  Micro Results: No results found for this or any previous visit (from the past 240 hour(s)). Studies/Results: Dg Chest 2 View  05/03/2016  CLINICAL  DATA:  Acute onset of productive cough and generalized chest pain. Shortness of breath. Initial encounter. EXAM: CHEST  2 VIEW COMPARISON:  Chest radiograph performed earlier today at 12:44 p.m. FINDINGS: The lungs are mildly hypoexpanded. Mildly increased central lung markings may reflect a mild infectious process or possibly mild interstitial edema. No pleural effusion or pneumothorax is seen. The cardiomediastinal silhouette is normal in size. No acute osseous abnormalities are identified. A left-sided chest port is noted ending about the distal SVC. IMPRESSION: Lungs mildly hypoexpanded. Mildly increased central lung markings may reflect a mild infectious process or possibly mild interstitial edema. Electronically Signed   By: Roanna Raider M.D.   On: 05/03/2016 21:51   Dg Chest Port 1 View  05/03/2016  CLINICAL DATA:  Worsening shortness of breath. Fever and headache. History of myocardial infarction. EXAM: PORTABLE CHEST 1 VIEW COMPARISON:   10/15/2015; 03/16/2015 FINDINGS: Grossly unchanged cardiac silhouette and mediastinal contours given reduced lung volumes. Stable position of support apparatus. Overall improved aeration of the lungs though there are persistent left mid and lower lung ill-defined interstitial opacities. No pleural effusion or pneumothorax. No acute osseous abnormalities. IMPRESSION: Improved aeration of lungs with persistent ill-defined heterogeneous opacities within the left mid and lower lung - differential considerations are broad though include asymmetric pulmonary edema versus atypical infection. A follow-up chest radiograph in 3 to 4 weeks after treatment is recommended to ensure resolution. Electronically Signed   By: Simonne Come M.D.   On: 05/03/2016 14:02   Medications: I have reviewed the patient's current medications. Scheduled Meds: . albuterol  2.5 mg Nebulization Q4H  . aspirin  81 mg Oral Daily  . atorvastatin  40 mg Oral q1800  . azithromycin  250 mg Intravenous Q24H  . divalproex  500 mg Oral Q8H  . enoxaparin (LOVENOX) injection  40 mg Subcutaneous Q24H  . gabapentin  900 mg Oral TID  . hydroxychloroquine  200 mg Oral BID  . levETIRAcetam  1,000 mg Oral BID  . topiramate  100 mg Oral Daily   Continuous Infusions:  PRN Meds:.acetaminophen, benzonatate, guaiFENesin-dextromethorphan, propranolol, sodium chloride flush Assessment/Plan: 55 yo with three day history of cough, myalgia, headache and SOB. Febrile, tachycardic, and tachypneic with normal WBC count.   Cough, SOB, headache, myalgias  Most concerned for viral PNA vs. Bronchitis given most recent CXR which shows central opacities.  Vital signs have stabilized since admission, no leukocytosis.  Has been receiving nebulizer treatments in hospital, which has been helpful.  Discussed discharge today, and patient is amenable to plan.  If patient able to ambulate without oxygen, can be discharged.  She received 1 dose of ceftriazone+azithromycin  yesterday.  Will continue her on a 5 day course of azithromycin to cover any atypical infection.   -Discharge today if satting well with no added O2 requirements when ambulating  -Oral azithromycin Day 2/5.  Continue as outpatient for remaining course.   -Send out with albuterol inhaler to use PRN -Tessalon for cough PRN  Low TSH Possible hyperthyroid given tachycardia, history of SVTs, anxiety.  -free T3, T4 to work up for hyperthyroidism -Will follow-up re: results in outpatient clinic   Hypokalemia Potassium 3.4 on admission.  Currently 3.7 after K+ repletion.  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  The care of the patient was discussed with Dr. Gwynn BurlyAndrew Wallace and the assessment and plan formulated with their assistance.  Please see their attached note for official documentation of the daily encounter.   LOS: 1 day   Nathaneil Canaryolleen M Zakhai Meisinger, Med Student 05/04/2016, 10:53 AM

## 2016-05-04 NOTE — Plan of Care (Signed)
Problem: Activity: Goal: Ability to tolerate increased activity will improve Outcome: Completed/Met Date Met:  05/04/16 Able to ambulate independently in room.  Problem: Respiratory: Goal: Respiratory status will improve Outcome: Progressing C/o SOB, tachypnea, but maintaining great O2 sats.

## 2016-05-04 NOTE — Progress Notes (Signed)
   05/04/16 1636  Clinical Encounter Type  Visited With Patient;Health care provider  Visit Type Follow-up   Chaplain provided follow-up care. Patient was given some medicine to help her rest, so patient asked that we come back by tomorrow. Chaplain will seek to follow-up. Spiritual care services available as needed.    Alda Ponderdam M Nan Maya, Chaplain 05/04/2016 4:37 PM

## 2016-05-05 LAB — T3, FREE: T3 FREE: 2.3 pg/mL (ref 2.0–4.4)

## 2016-05-08 LAB — CULTURE, BLOOD (ROUTINE X 2)
CULTURE: NO GROWTH
Culture: NO GROWTH

## 2016-06-05 DIAGNOSIS — H5203 Hypermetropia, bilateral: Secondary | ICD-10-CM | POA: Diagnosis not present

## 2016-06-24 ENCOUNTER — Telehealth: Payer: Self-pay | Admitting: Cardiovascular Disease

## 2016-06-24 NOTE — Telephone Encounter (Deleted)
error 

## 2016-07-23 ENCOUNTER — Other Ambulatory Visit: Payer: Self-pay | Admitting: Neurology

## 2016-07-23 NOTE — Telephone Encounter (Signed)
No follow up scheduled.  Please advise 

## 2016-07-24 NOTE — Telephone Encounter (Signed)
Ok to refill, pls have her schedule f/u, thanks

## 2016-07-24 NOTE — Telephone Encounter (Signed)
[facility] is calling in regards to a medication refill for PT/Brenda Mcguire CB# [phone]

## 2016-07-24 NOTE — Telephone Encounter (Signed)
Ok to refill? Patient does not have follow up scheduled

## 2016-07-24 NOTE — Telephone Encounter (Signed)
rx sent to pharmacy

## 2016-07-24 NOTE — Telephone Encounter (Signed)
Called to schedule appointment. No answer on either number.

## 2016-10-31 IMAGING — CT CT HEAD W/O CM
2 series · 16 of 30 positions shown, 20 images · non-contrast
Comparison: 09/03/2015.

CLINICAL DATA: Severe headache x4 days, unequal pupils, nausea.
History of seizures, chronic headaches, and Raada disease.

EXAM:
CT HEAD WITHOUT CONTRAST
TECHNIQUE: Contiguous axial images were obtained from the base of the skull
through the vertex without intravenous contrast.

[Series 201: head w/o, idose (1) · axial · non-contrast · 0.44mm/px · z∈[+79,+199]mm · 13 of 30 slices shown, 17 images]
[im 3/30  brain]
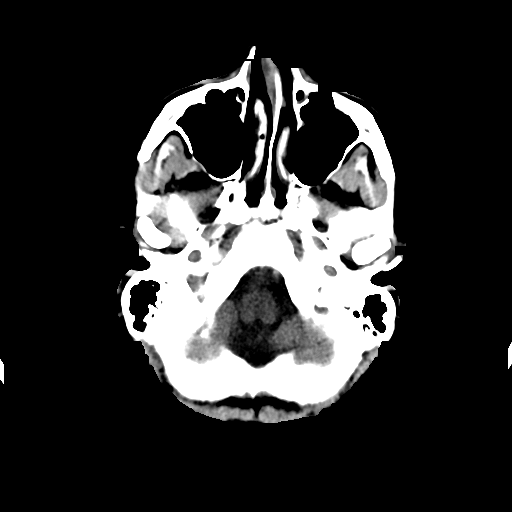
[im 3/30  bone]
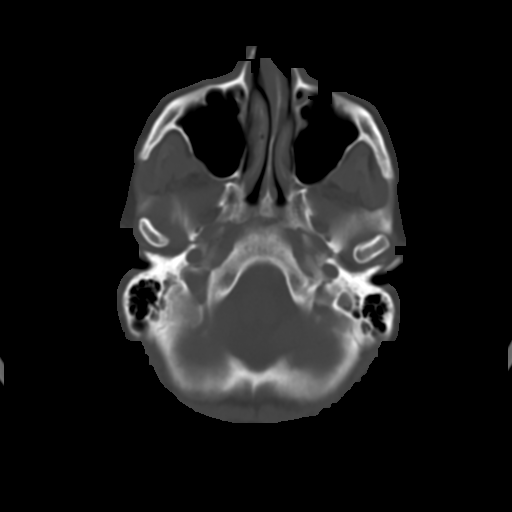
[im 5/30  brain]
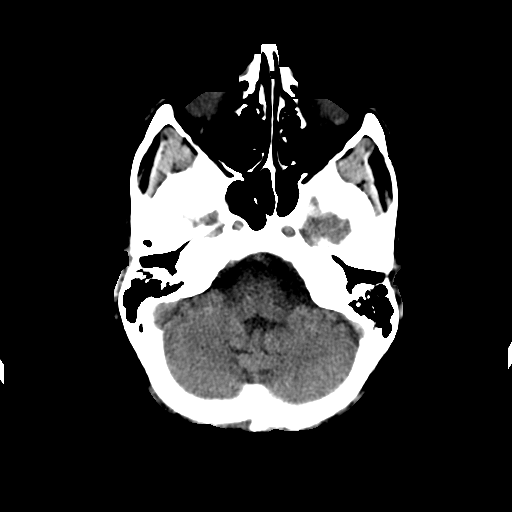
[im 7/30  brain]
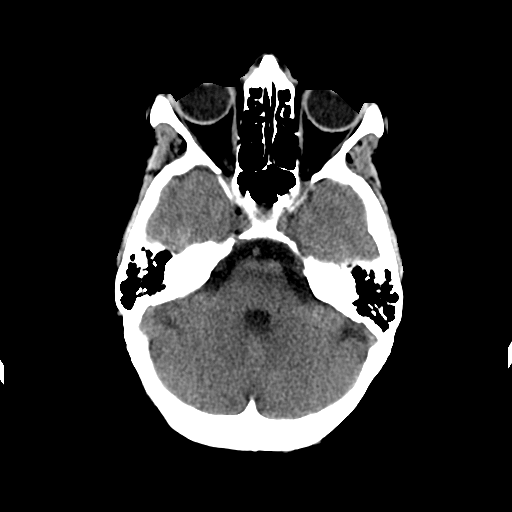
[im 9/30  brain]
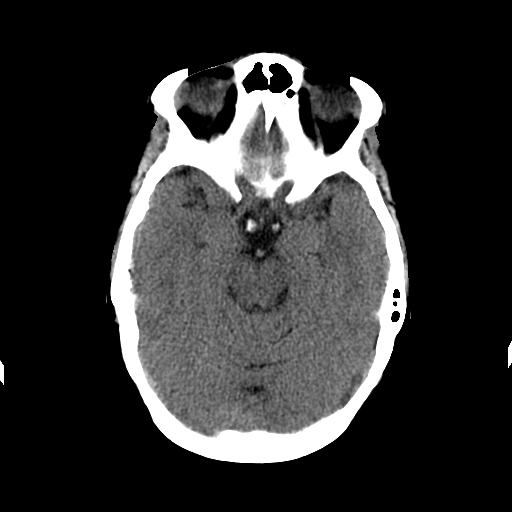
[im 11/30  brain]
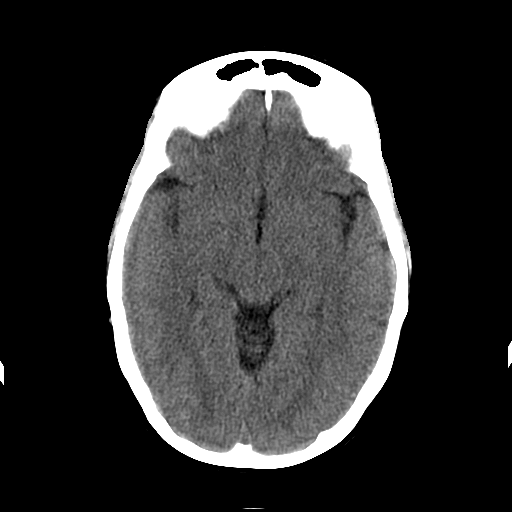
[im 11/30  bone]
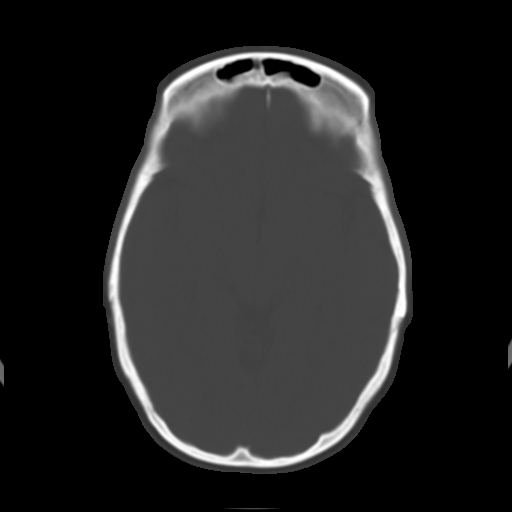
[im 13/30  brain]
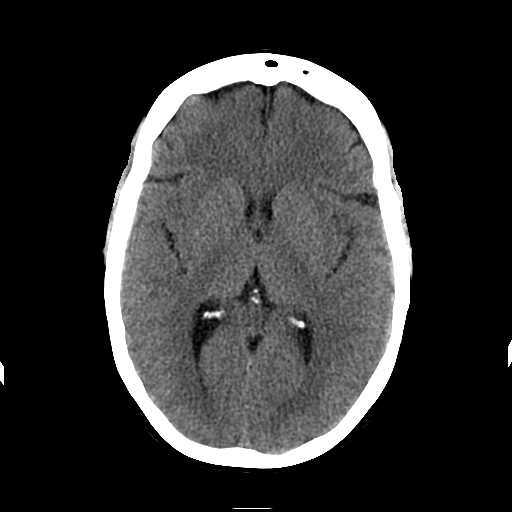
[im 15/30  brain]
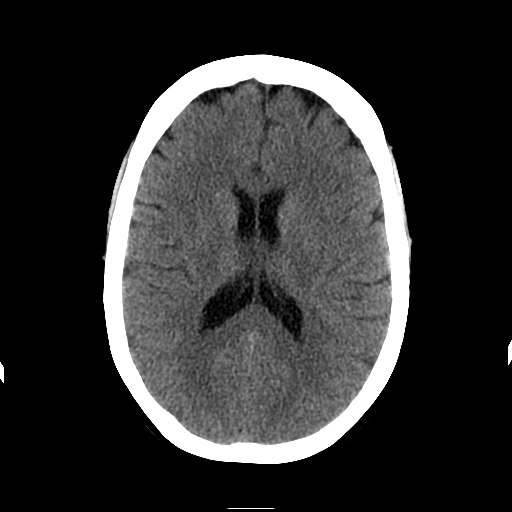
[im 17/30  brain]
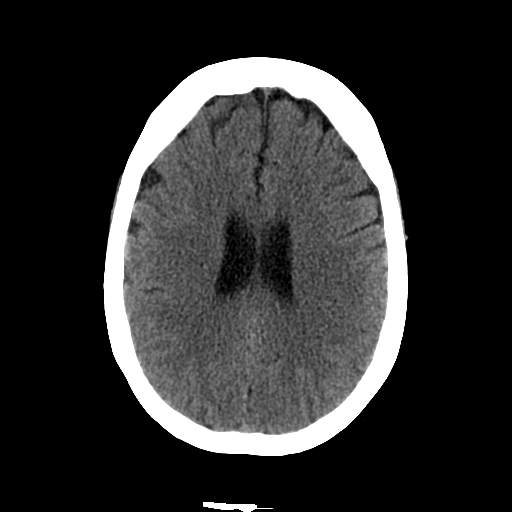
[im 19/30  brain]
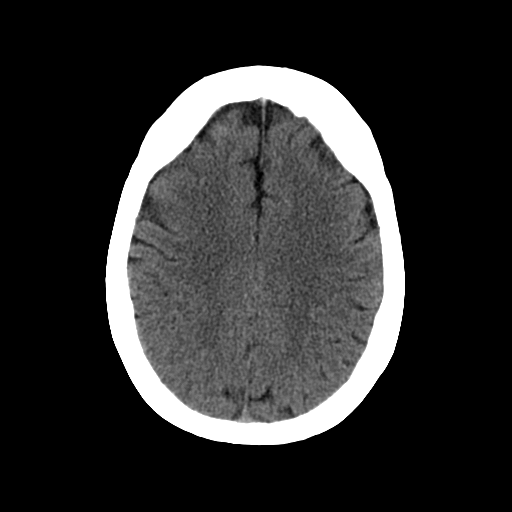
[im 19/30  bone]
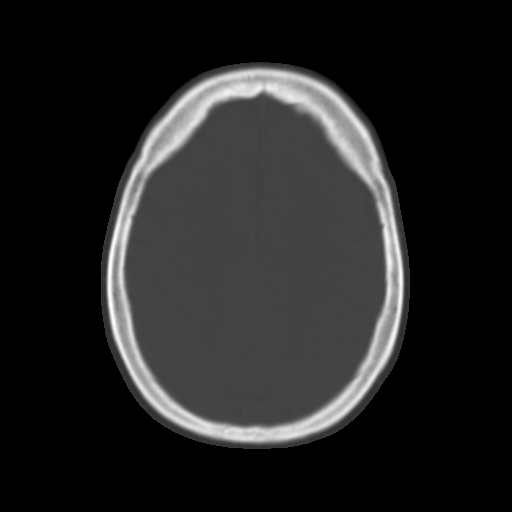
[im 21/30  brain]
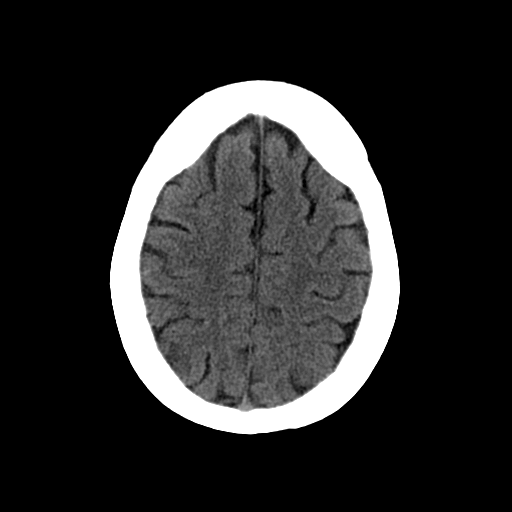
[im 23/30  brain]
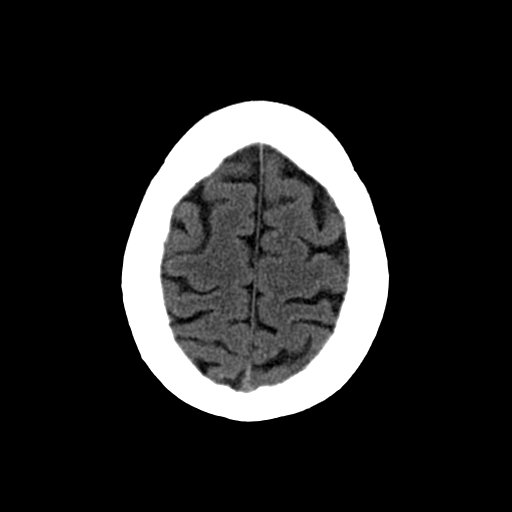
[im 25/30  brain]
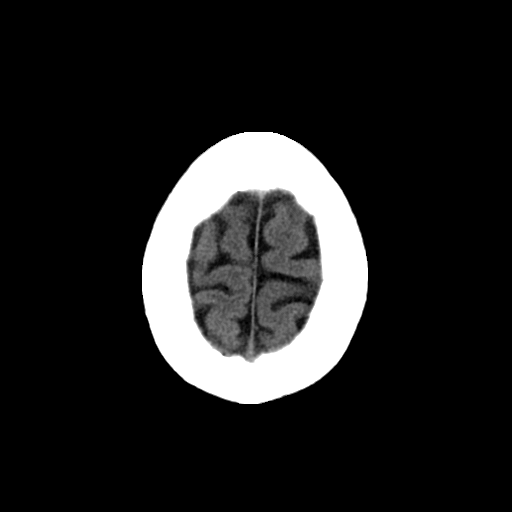
[im 27/30  brain]
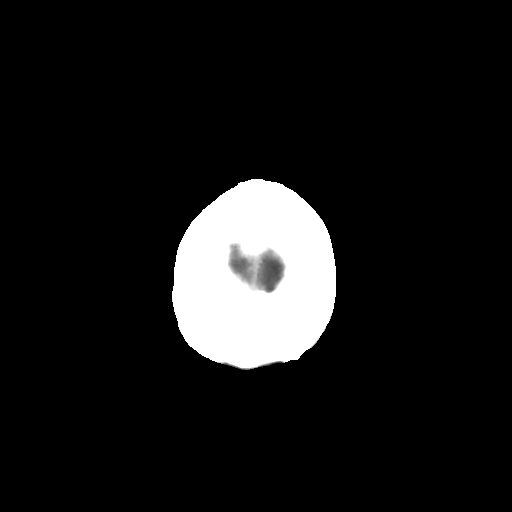
[im 27/30  bone]
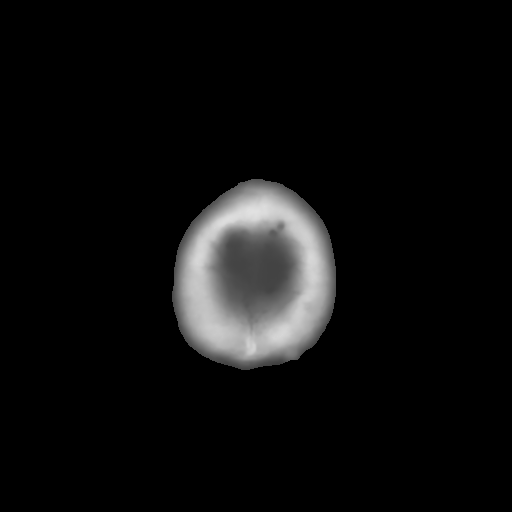

[Series 202: head w/o bone, idose (1) · axial · non-contrast · 0.44mm/px · z∈[+79,+119]mm · 3 of 30 slices shown]
[im 3/30  bone]
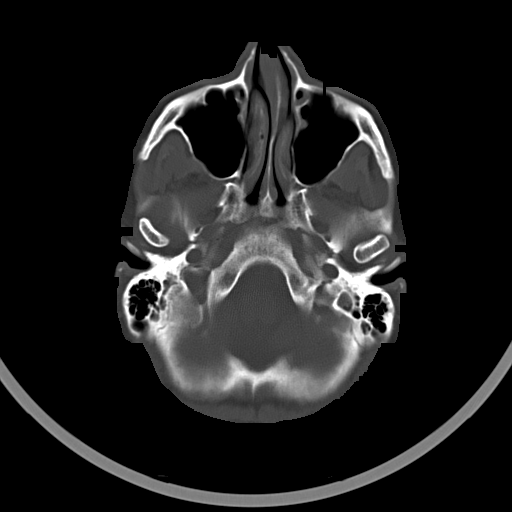
[im 7/30  bone]
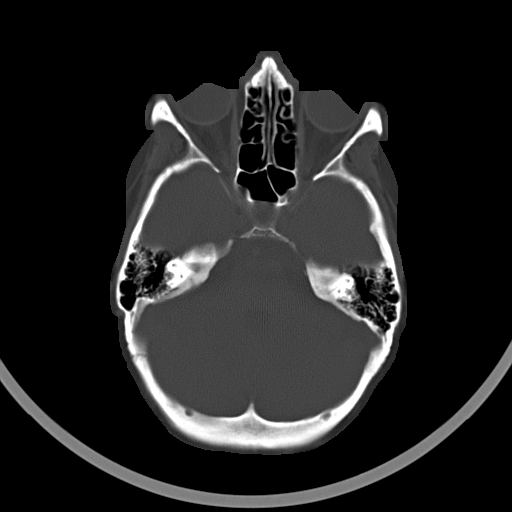
[im 11/30  bone]
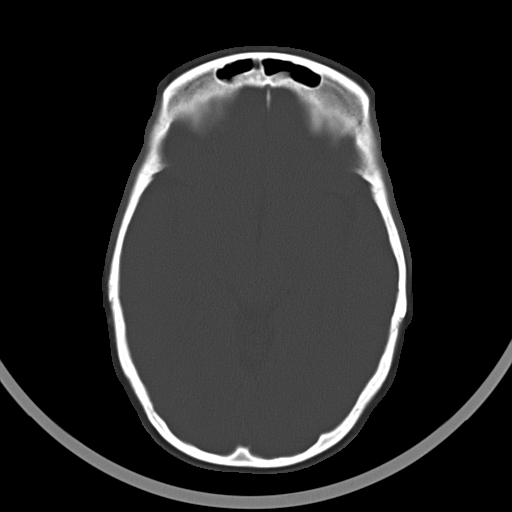

[16 of 30 positions shown; findings below may reference images not displayed]

FINDINGS: No evidence of parenchymal hemorrhage or extra-axial fluid
collection. No mass lesion, mass effect, or midline shift.

No CT evidence of acute infarction.

Cerebral volume is within normal limits.  No ventriculomegaly.

The visualized paranasal sinuses are essentially clear. The mastoid
air cells are unopacified.

No evidence of calvarial fracture.
IMPRESSION: Normal head CT.

## 2016-11-03 IMAGING — CR DG CHEST 1V PORT
1 series · 1 of 1 positions shown · non-contrast
Comparison: April 05, 2015

CLINICAL DATA: Hypotension

EXAM:
PORTABLE CHEST 1 VIEW

[AP]
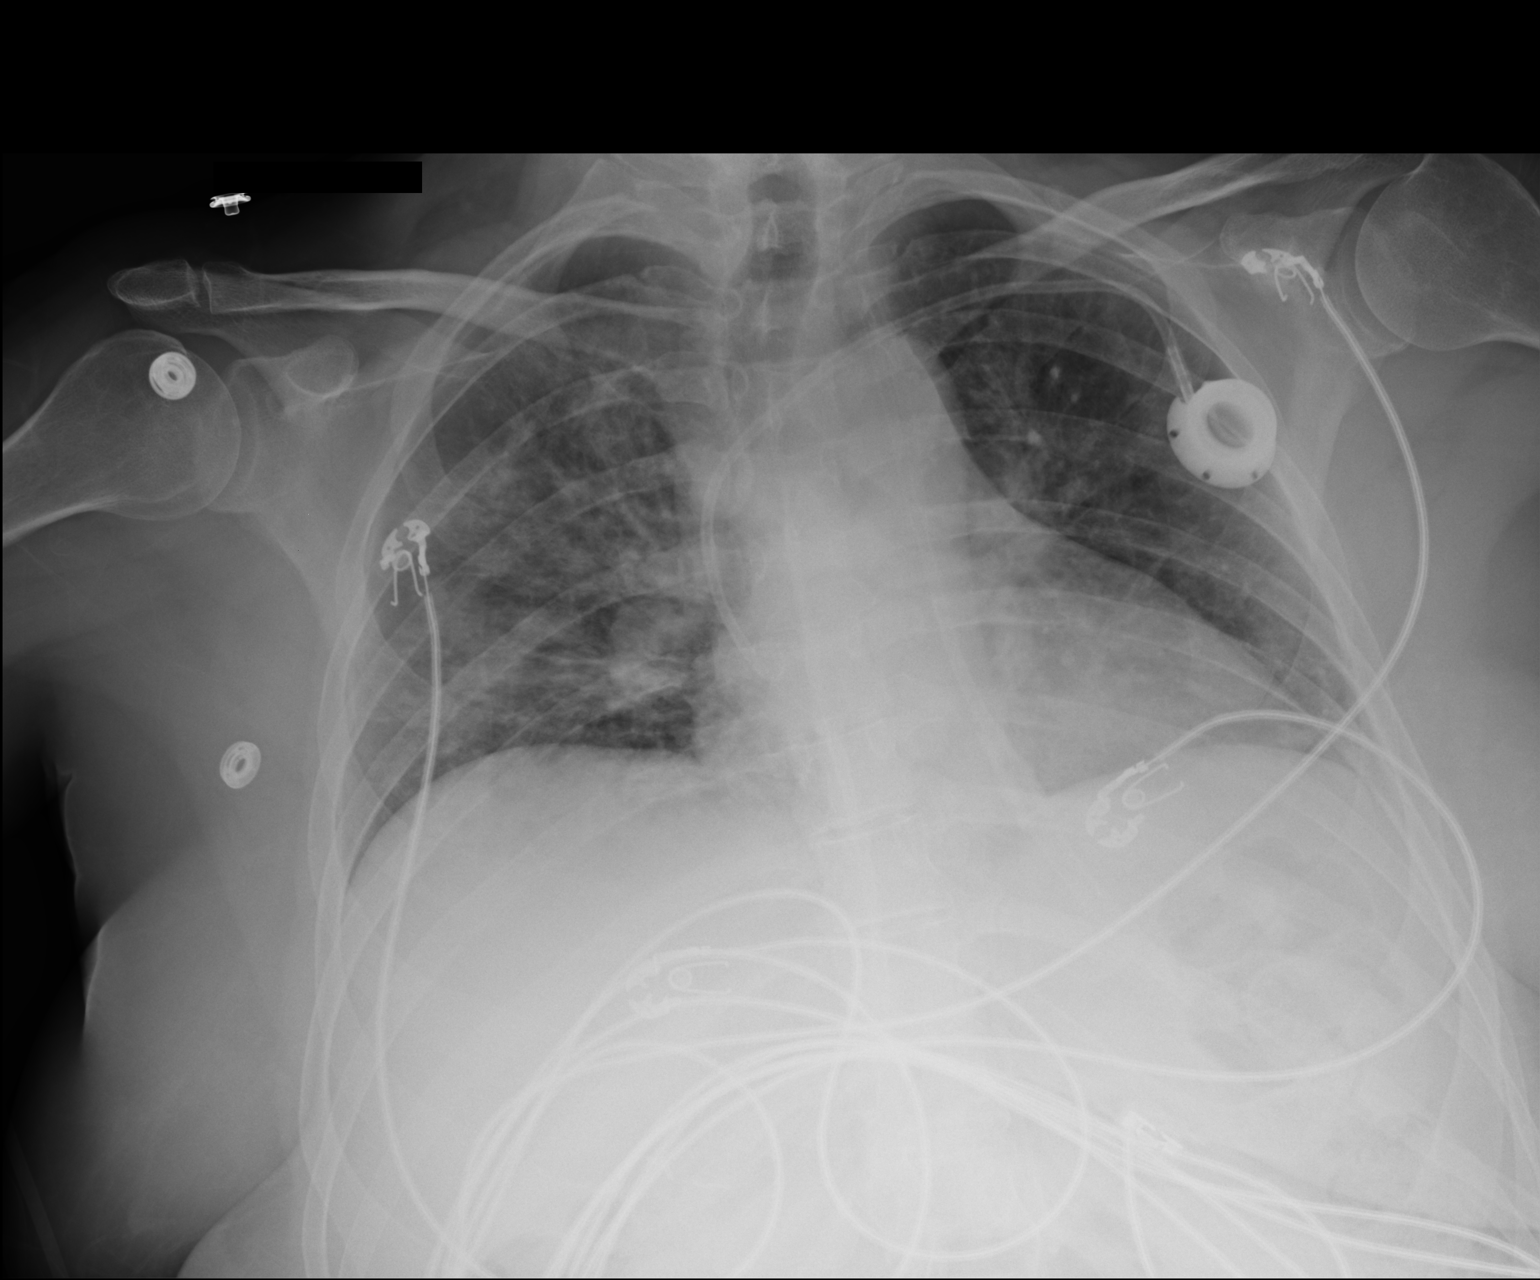

[1 of 1 positions shown; findings below may reference images not displayed]

FINDINGS: There is patchy infiltrate throughout much of the right lung. The
left lung is clear. Heart size and pulmonary vascularity are normal.
No adenopathy. Port-A-Cath tip is in the superior vena cava near the
cavoatrial junction. No pneumothorax.
IMPRESSION: Patchy infiltrate throughout much of the right lung. This appearance
is consistent with pneumonia or aspiration. Left lung clear. No
change in cardiac silhouette.

## 2016-11-03 IMAGING — CT CT HEAD W/O CM
2 series · 15 of 30 positions shown, 17 images · non-contrast
Comparison: Greater than 20 prior head CTs dating back to September 2005, most recently 10/12/2015.

CLINICAL DATA: 54-year-old with generalized headache over the past
4 days associated with acute mental status changes.

EXAM:
CT HEAD WITHOUT CONTRAST
TECHNIQUE: Contiguous axial images were obtained from the base of the skull
through the vertex without intravenous contrast.

[Series 2: head without · axial · non-contrast · 0.43mm/px · z∈[+1292,+1407]mm · 7 of 31 slices shown, 9 images]
[im 4/31  brain]
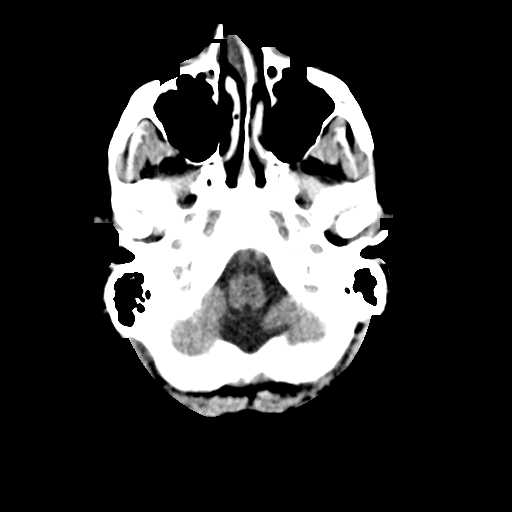
[im 4/31  bone]
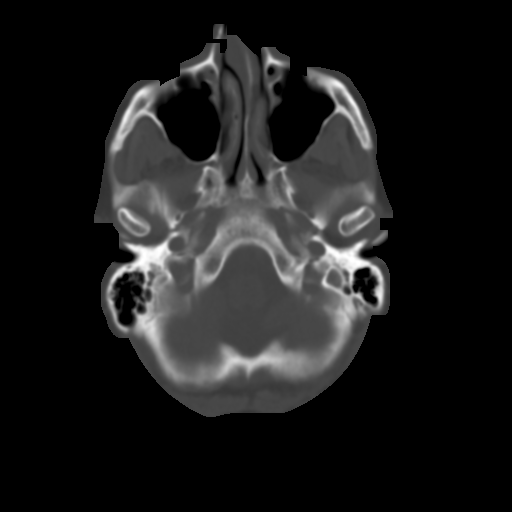
[im 8/31  brain]
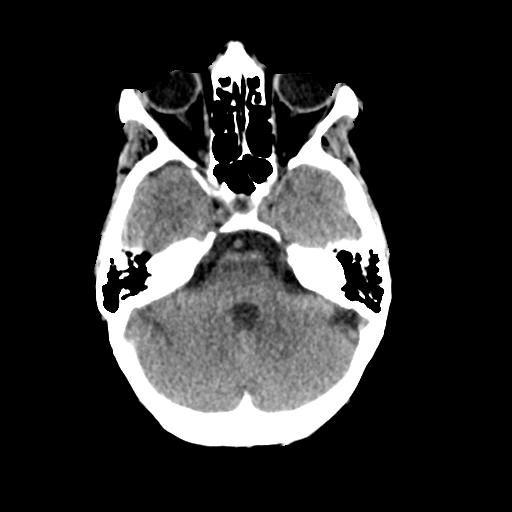
[im 12/31  brain]
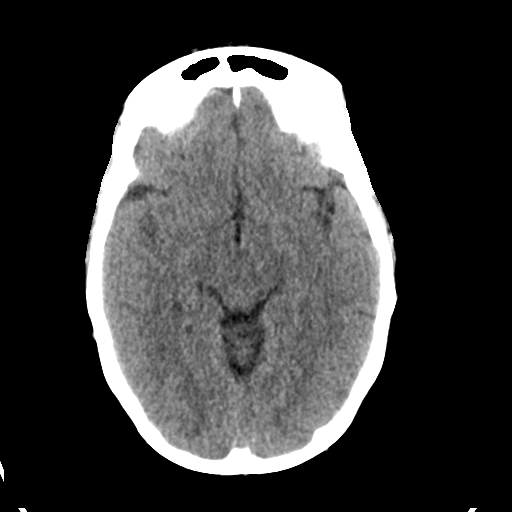
[im 16/31  brain]
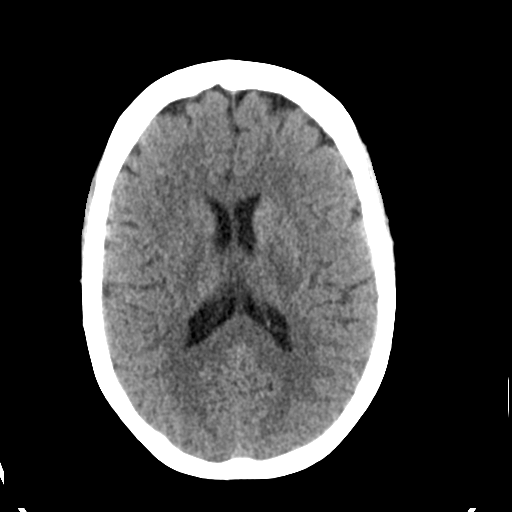
[im 19/31  brain]
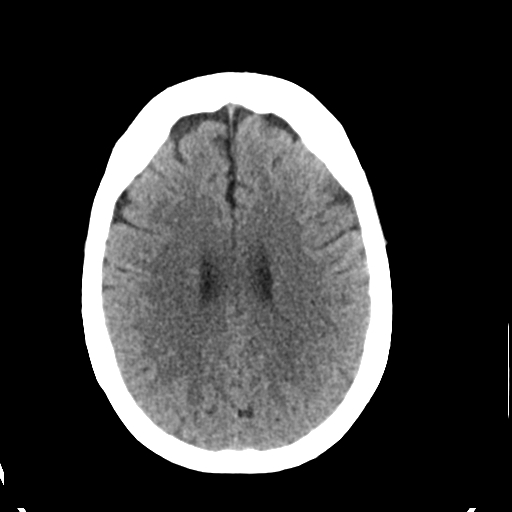
[im 19/31  bone]
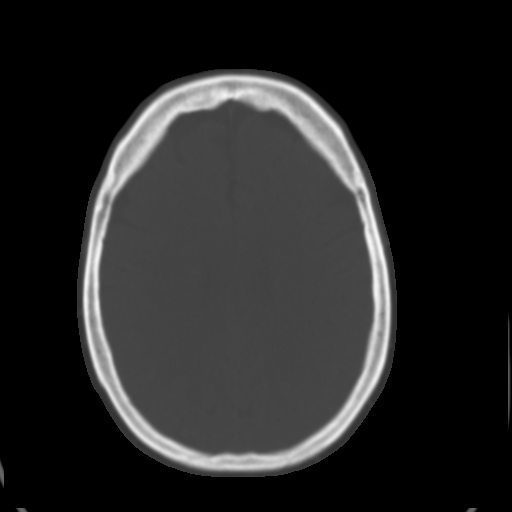
[im 23/31  brain]
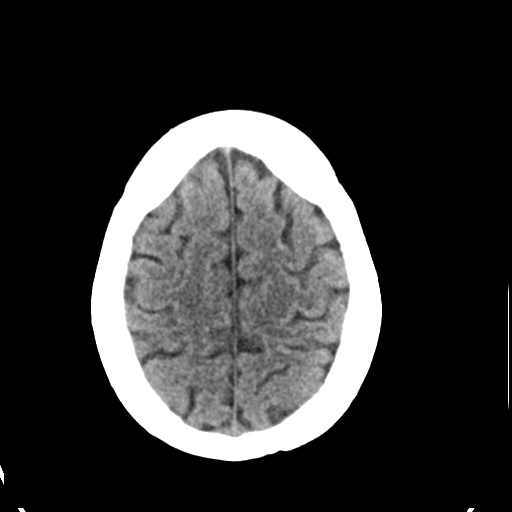
[im 27/31  brain]
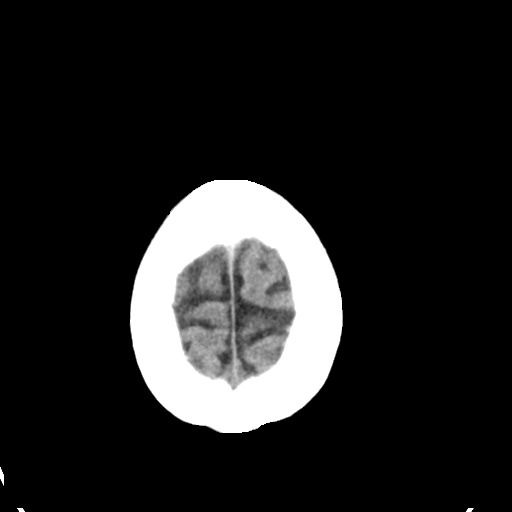

[Series 3: head bone · axial · 0.43mm/px · z∈[+1291,+1413]mm · 8 of 77 slices shown]
[im 8/77  bone]
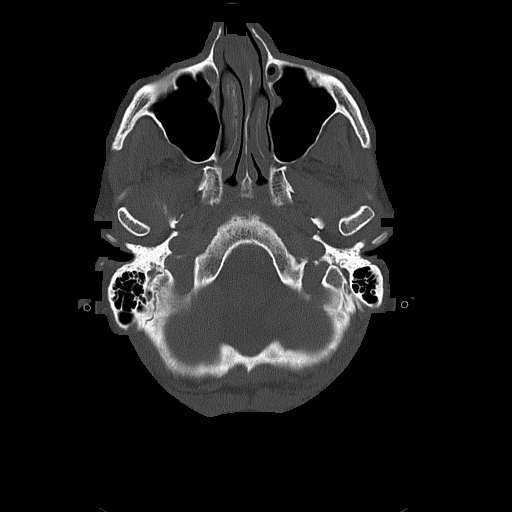
[im 16/77  bone]
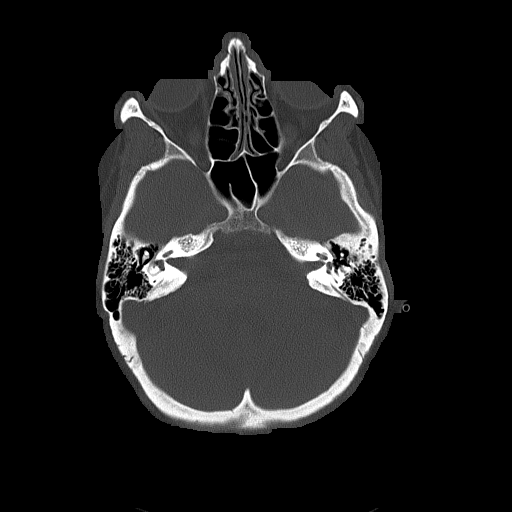
[im 23/77  bone]
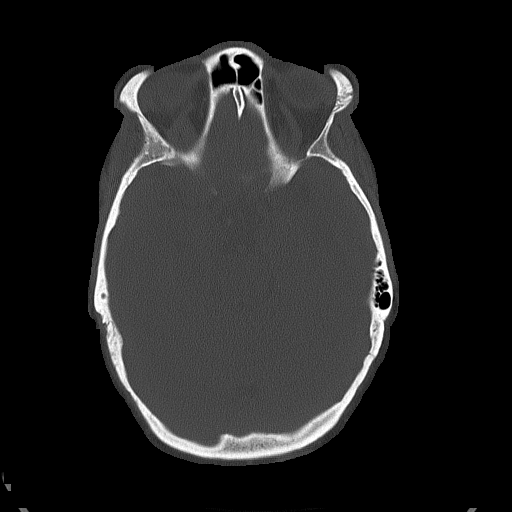
[im 35/77  bone]
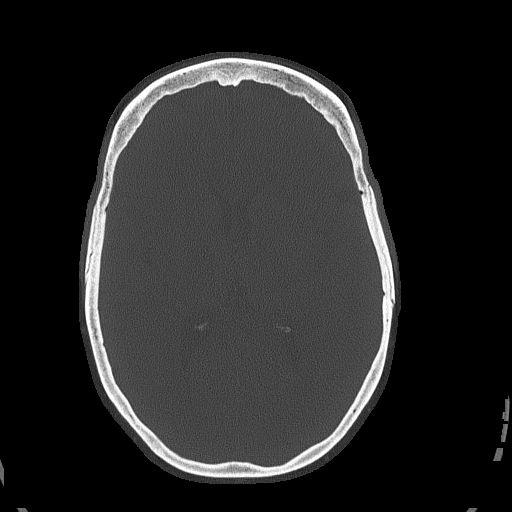
[im 42/77  bone]
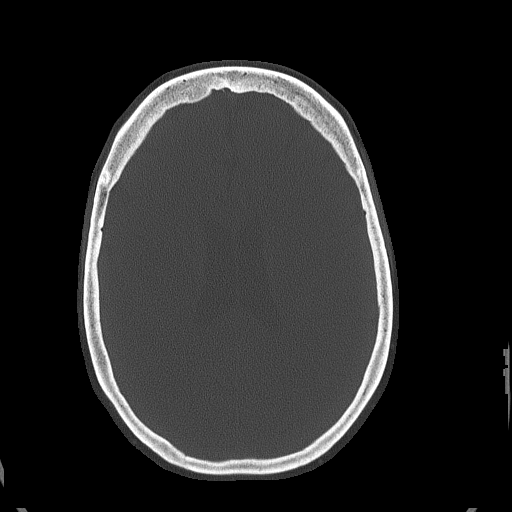
[im 54/77  bone]
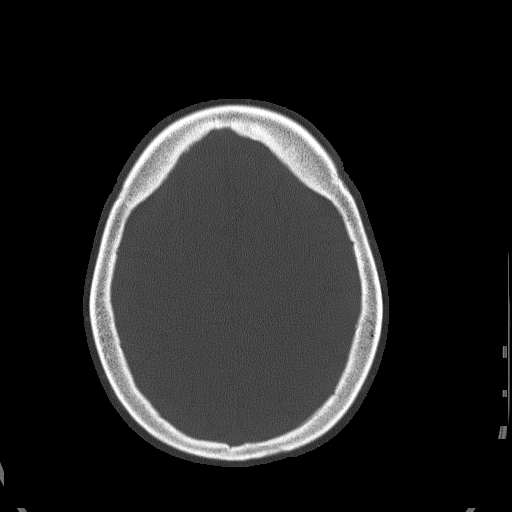
[im 61/77  bone]
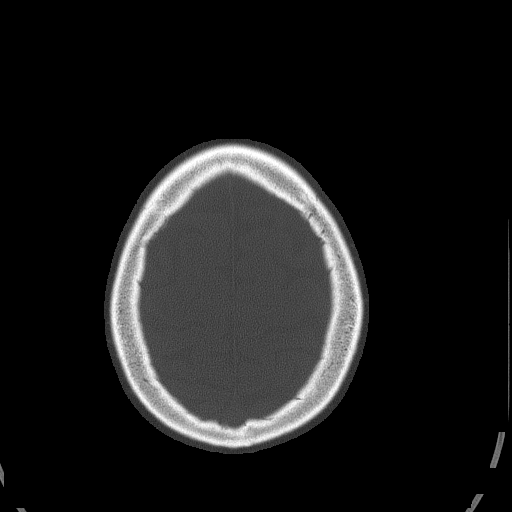
[im 69/77  bone]
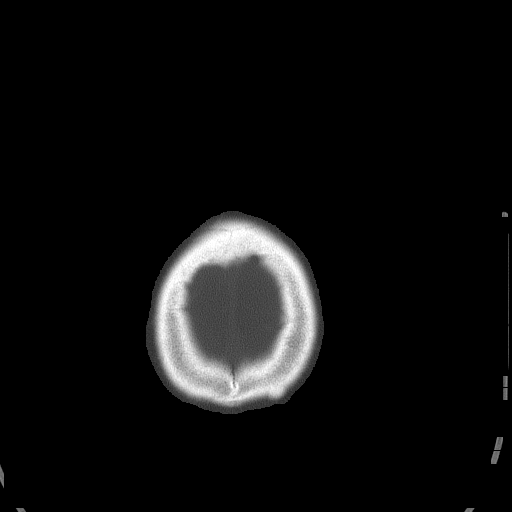

[15 of 30 positions shown; findings below may reference images not displayed]

FINDINGS: Ventricular system normal in size and appearance for age. No mass
lesion. No midline shift. No acute hemorrhage or hematoma. No
extra-axial fluid collections. No evidence of acute infarction. No
focal brain parenchymal abnormalities.

No focal osseous abnormalities involving the skull. Visualized
paranasal sinuses, bilateral mastoid air cells, and bilateral middle
ear cavities well-aerated.
IMPRESSION: Normal examination.

## 2017-01-05 ENCOUNTER — Telehealth: Payer: Self-pay

## 2017-01-05 NOTE — Telephone Encounter (Signed)
[facility]

## 2017-09-13 DEATH — deceased
